# Patient Record
Sex: Male | Born: 1950
Health system: Southern US, Community
[De-identification: ages and names within clinical notes are randomized; demographics above are authoritative.]

## PROBLEM LIST (undated history)

## (undated) DIAGNOSIS — Z8719 Personal history of other diseases of the digestive system: Secondary | ICD-10-CM

## (undated) DIAGNOSIS — J302 Other seasonal allergic rhinitis: Secondary | ICD-10-CM

## (undated) DIAGNOSIS — G4700 Insomnia, unspecified: Secondary | ICD-10-CM

## (undated) DIAGNOSIS — S7290XA Unspecified fracture of unspecified femur, initial encounter for closed fracture: Secondary | ICD-10-CM

## (undated) DIAGNOSIS — J449 Chronic obstructive pulmonary disease, unspecified: Secondary | ICD-10-CM

## (undated) DIAGNOSIS — F1020 Alcohol dependence, uncomplicated: Secondary | ICD-10-CM

## (undated) DIAGNOSIS — F329 Major depressive disorder, single episode, unspecified: Secondary | ICD-10-CM

## (undated) DIAGNOSIS — J189 Pneumonia, unspecified organism: Secondary | ICD-10-CM

## (undated) DIAGNOSIS — I10 Essential (primary) hypertension: Secondary | ICD-10-CM

## (undated) DIAGNOSIS — M5090 Cervical disc disorder, unspecified, unspecified cervical region: Secondary | ICD-10-CM

## (undated) DIAGNOSIS — I639 Cerebral infarction, unspecified: Secondary | ICD-10-CM

## (undated) DIAGNOSIS — S2239XA Fracture of one rib, unspecified side, initial encounter for closed fracture: Secondary | ICD-10-CM

## (undated) DIAGNOSIS — D696 Thrombocytopenia, unspecified: Secondary | ICD-10-CM

## (undated) DIAGNOSIS — E785 Hyperlipidemia, unspecified: Secondary | ICD-10-CM

## (undated) DIAGNOSIS — IMO0001 Reserved for inherently not codable concepts without codable children: Secondary | ICD-10-CM

## (undated) DIAGNOSIS — I719 Aortic aneurysm of unspecified site, without rupture: Secondary | ICD-10-CM

## (undated) DIAGNOSIS — H9190 Unspecified hearing loss, unspecified ear: Secondary | ICD-10-CM

## (undated) DIAGNOSIS — S92355A Nondisplaced fracture of fifth metatarsal bone, left foot, initial encounter for closed fracture: Secondary | ICD-10-CM

## (undated) DIAGNOSIS — G259 Extrapyramidal and movement disorder, unspecified: Secondary | ICD-10-CM

## (undated) DIAGNOSIS — F1090 Alcohol use, unspecified, uncomplicated: Secondary | ICD-10-CM

## (undated) DIAGNOSIS — L602 Onychogryphosis: Secondary | ICD-10-CM

## (undated) DIAGNOSIS — I712 Thoracic aortic aneurysm, without rupture, unspecified: Secondary | ICD-10-CM

## (undated) DIAGNOSIS — T4145XA Adverse effect of unspecified anesthetic, initial encounter: Secondary | ICD-10-CM

## (undated) DIAGNOSIS — M1610 Unilateral primary osteoarthritis, unspecified hip: Secondary | ICD-10-CM

## (undated) DIAGNOSIS — E119 Type 2 diabetes mellitus without complications: Secondary | ICD-10-CM

## (undated) DIAGNOSIS — F1010 Alcohol abuse, uncomplicated: Secondary | ICD-10-CM

## (undated) DIAGNOSIS — K219 Gastro-esophageal reflux disease without esophagitis: Secondary | ICD-10-CM

## (undated) DIAGNOSIS — I7121 Aneurysm of the ascending aorta, without rupture: Secondary | ICD-10-CM

## (undated) DIAGNOSIS — I2510 Atherosclerotic heart disease of native coronary artery without angina pectoris: Secondary | ICD-10-CM

## (undated) DIAGNOSIS — K7460 Unspecified cirrhosis of liver: Secondary | ICD-10-CM

## (undated) DIAGNOSIS — Z7289 Other problems related to lifestyle: Secondary | ICD-10-CM

## (undated) DIAGNOSIS — I509 Heart failure, unspecified: Secondary | ICD-10-CM

## (undated) DIAGNOSIS — R0902 Hypoxemia: Secondary | ICD-10-CM

## (undated) DIAGNOSIS — G4730 Sleep apnea, unspecified: Secondary | ICD-10-CM

## (undated) DIAGNOSIS — J40 Bronchitis, not specified as acute or chronic: Secondary | ICD-10-CM

## (undated) DIAGNOSIS — B1920 Unspecified viral hepatitis C without hepatic coma: Secondary | ICD-10-CM

## (undated) DIAGNOSIS — I499 Cardiac arrhythmia, unspecified: Secondary | ICD-10-CM

## (undated) DIAGNOSIS — H269 Unspecified cataract: Secondary | ICD-10-CM

## (undated) DIAGNOSIS — I4891 Unspecified atrial fibrillation: Secondary | ICD-10-CM

## (undated) DIAGNOSIS — T8859XA Other complications of anesthesia, initial encounter: Secondary | ICD-10-CM

## (undated) DIAGNOSIS — K223 Perforation of esophagus: Secondary | ICD-10-CM

## (undated) DIAGNOSIS — Z87891 Personal history of nicotine dependence: Secondary | ICD-10-CM

## (undated) DIAGNOSIS — F32A Depression, unspecified: Secondary | ICD-10-CM

## (undated) DIAGNOSIS — J439 Emphysema, unspecified: Secondary | ICD-10-CM

## (undated) HISTORY — DX: Extrapyramidal and movement disorder, unspecified: G25.9

## (undated) HISTORY — DX: Unspecified cirrhosis of liver: K74.60

## (undated) HISTORY — PX: APPENDECTOMY: SHX54

## (undated) HISTORY — PX: PEG TUBE REMOVAL: SHX2187

## (undated) HISTORY — DX: Hypoxemia: R09.02

## (undated) HISTORY — PX: TRACHEOSTOMY: SUR1362

## (undated) HISTORY — DX: Thrombocytopenia, unspecified: D69.6

## (undated) HISTORY — DX: Unilateral primary osteoarthritis, unspecified hip: M16.10

## (undated) HISTORY — PX: COLON RESECTION: SHX5231

## (undated) HISTORY — PX: COLON SURGERY: SHX602

## (undated) HISTORY — DX: Unspecified atrial fibrillation: I48.91

## (undated) HISTORY — DX: Unspecified viral hepatitis C without hepatic coma: B19.20

## (undated) HISTORY — DX: Unspecified fracture of unspecified femur, initial encounter for closed fracture: S72.90XA

## (undated) HISTORY — DX: Nondisplaced fracture of fifth metatarsal bone, left foot, initial encounter for closed fracture: S92.355A

## (undated) HISTORY — DX: Aortic aneurysm of unspecified site, without rupture: I71.9

## (undated) HISTORY — DX: Fracture of one rib, unspecified side, initial encounter for closed fracture: S22.39XA

## (undated) HISTORY — PX: HERNIA REPAIR: SHX51

## (undated) HISTORY — DX: Hyperlipidemia, unspecified: E78.5

## (undated) HISTORY — DX: Onychogryphosis: L60.2

## (undated) HISTORY — DX: Alcohol use, unspecified, uncomplicated: F10.90

## (undated) HISTORY — DX: Other problems related to lifestyle: Z72.89

## (undated) HISTORY — DX: Insomnia, unspecified: G47.00

## (undated) HISTORY — DX: Alcohol abuse, uncomplicated: F10.10

## (undated) HISTORY — PX: PEG PLACEMENT: SHX5437

## (undated) HISTORY — DX: Atherosclerotic heart disease of native coronary artery without angina pectoris: I25.10

## (undated) HISTORY — DX: Emphysema, unspecified: J43.9

## (undated) HISTORY — DX: Unspecified cataract: H26.9

## (undated) HISTORY — DX: Personal history of other diseases of the digestive system: Z87.19

## (undated) HISTORY — PX: PILONIDAL CYST EXCISION: SHX744

## (undated) HISTORY — PX: FRACTURE SURGERY: SHX138

## (undated) HISTORY — DX: Personal history of nicotine dependence: Z87.891

## (undated) HISTORY — PX: CHOLECYSTECTOMY: SHX55

## (undated) HISTORY — DX: Alcohol dependence, uncomplicated: F10.20

---

## 2006-08-04 ENCOUNTER — Other Ambulatory Visit: Payer: Self-pay

## 2006-08-04 ENCOUNTER — Inpatient Hospital Stay: Payer: Self-pay | Admitting: Internal Medicine

## 2007-08-20 ENCOUNTER — Ambulatory Visit: Payer: Self-pay | Admitting: Gastroenterology

## 2008-09-10 ENCOUNTER — Inpatient Hospital Stay: Payer: Self-pay | Admitting: Internal Medicine

## 2008-09-10 ENCOUNTER — Other Ambulatory Visit: Payer: Self-pay

## 2008-09-13 ENCOUNTER — Other Ambulatory Visit: Payer: Self-pay

## 2008-10-11 ENCOUNTER — Ambulatory Visit: Payer: Self-pay | Admitting: Surgery

## 2008-10-12 ENCOUNTER — Ambulatory Visit: Payer: Self-pay | Admitting: Surgery

## 2009-03-18 ENCOUNTER — Inpatient Hospital Stay: Payer: Self-pay | Admitting: Internal Medicine

## 2009-05-02 ENCOUNTER — Ambulatory Visit: Payer: Self-pay | Admitting: Specialist

## 2009-10-17 ENCOUNTER — Ambulatory Visit: Payer: Self-pay | Admitting: Urology

## 2009-10-23 ENCOUNTER — Ambulatory Visit: Payer: Self-pay | Admitting: Surgery

## 2009-10-30 ENCOUNTER — Inpatient Hospital Stay: Payer: Self-pay | Admitting: Surgery

## 2010-01-04 ENCOUNTER — Ambulatory Visit: Payer: Self-pay | Admitting: Unknown Physician Specialty

## 2011-04-06 ENCOUNTER — Inpatient Hospital Stay: Payer: Self-pay | Admitting: Internal Medicine

## 2011-12-10 ENCOUNTER — Ambulatory Visit: Payer: Self-pay | Admitting: Internal Medicine

## 2012-01-03 ENCOUNTER — Inpatient Hospital Stay: Payer: Self-pay | Admitting: Internal Medicine

## 2012-01-03 LAB — COMPREHENSIVE METABOLIC PANEL
Albumin: 3.2 g/dL — ABNORMAL LOW (ref 3.4–5.0)
Alkaline Phosphatase: 74 U/L (ref 50–136)
Anion Gap: 14 (ref 7–16)
Bilirubin,Total: 0.5 mg/dL (ref 0.2–1.0)
Calcium, Total: 8.2 mg/dL — ABNORMAL LOW (ref 8.5–10.1)
Glucose: 248 mg/dL — ABNORMAL HIGH (ref 65–99)
Osmolality: 272 (ref 275–301)
Potassium: 3.2 mmol/L — ABNORMAL LOW (ref 3.5–5.1)
SGOT(AST): 65 U/L — ABNORMAL HIGH (ref 15–37)
Sodium: 130 mmol/L — ABNORMAL LOW (ref 136–145)
Total Protein: 7 g/dL (ref 6.4–8.2)

## 2012-01-03 LAB — CK TOTAL AND CKMB (NOT AT ARMC)
CK, Total: 378 U/L — ABNORMAL HIGH (ref 35–232)
CK-MB: 9.9 ng/mL — ABNORMAL HIGH (ref 0.5–3.6)

## 2012-01-03 LAB — PRO B NATRIURETIC PEPTIDE: B-Type Natriuretic Peptide: 871 pg/mL — ABNORMAL HIGH (ref 0–125)

## 2012-01-03 LAB — CBC
HCT: 30.3 % — ABNORMAL LOW (ref 40.0–52.0)
HGB: 10.3 g/dL — ABNORMAL LOW (ref 13.0–18.0)
MCV: 81 fL (ref 80–100)
Platelet: 270 10*3/uL (ref 150–440)
RDW: 16.2 % — ABNORMAL HIGH (ref 11.5–14.5)

## 2012-01-03 LAB — POTASSIUM: Potassium: 3.9 mmol/L (ref 3.5–5.1)

## 2012-01-03 LAB — SODIUM: Sodium: 130 mmol/L — ABNORMAL LOW (ref 136–145)

## 2012-01-03 LAB — TROPONIN I: Troponin-I: 0.02 ng/mL

## 2012-01-04 LAB — IRON AND TIBC
Iron Bind.Cap.(Total): 402 ug/dL (ref 250–450)
Iron Saturation: 5 %
Iron: 19 ug/dL — ABNORMAL LOW (ref 65–175)
Unbound Iron-Bind.Cap.: 383 ug/dL

## 2012-01-04 LAB — BASIC METABOLIC PANEL
Anion Gap: 11 (ref 7–16)
BUN: 31 mg/dL — ABNORMAL HIGH (ref 7–18)
BUN: 37 mg/dL — ABNORMAL HIGH (ref 7–18)
Calcium, Total: 7.1 mg/dL — ABNORMAL LOW (ref 8.5–10.1)
Chloride: 92 mmol/L — ABNORMAL LOW (ref 98–107)
Co2: 27 mmol/L (ref 21–32)
Co2: 25 mmol/L (ref 21–32)
Creatinine: 1.91 mg/dL — ABNORMAL HIGH (ref 0.60–1.30)
Creatinine: 2.06 mg/dL — ABNORMAL HIGH (ref 0.60–1.30)
EGFR (African American): 47 — ABNORMAL LOW
EGFR (Non-African Amer.): 35 — ABNORMAL LOW
Glucose: 214 mg/dL — ABNORMAL HIGH (ref 65–99)
Osmolality: 285 (ref 275–301)
Osmolality: 283 (ref 275–301)
Potassium: 4.6 mmol/L (ref 3.5–5.1)

## 2012-01-04 LAB — CBC WITH DIFFERENTIAL/PLATELET
Basophil #: 0 10*3/uL (ref 0.0–0.1)
Eosinophil %: 0 %
HCT: 28.1 % — ABNORMAL LOW (ref 40.0–52.0)
Lymphocyte %: 2.5 %
Monocyte %: 1.2 %
Platelet: 271 10*3/uL (ref 150–440)
RBC: 3.42 10*6/uL — ABNORMAL LOW (ref 4.40–5.90)
RDW: 16.9 % — ABNORMAL HIGH (ref 11.5–14.5)
WBC: 8.4 10*3/uL (ref 3.8–10.6)

## 2012-01-04 LAB — HEMOGLOBIN: HGB: 9 g/dL — ABNORMAL LOW (ref 13.0–18.0)

## 2012-01-04 LAB — HEMOGLOBIN A1C: Hemoglobin A1C: 9.1 % — ABNORMAL HIGH (ref 4.2–6.3)

## 2012-01-04 LAB — FERRITIN: Ferritin (ARMC): 26 ng/mL (ref 8–388)

## 2012-01-04 LAB — APTT: Activated PTT: 27.4 secs (ref 23.6–35.9)

## 2012-01-05 LAB — BASIC METABOLIC PANEL
Chloride: 106 mmol/L (ref 98–107)
Co2: 26 mmol/L (ref 21–32)
Creatinine: 1.36 mg/dL — ABNORMAL HIGH (ref 0.60–1.30)
EGFR (African American): >60
EGFR (Non-African Amer.): 57 — ABNORMAL LOW
Osmolality: 293 (ref 275–301)
Potassium: 4.6 mmol/L (ref 3.5–5.1)
Sodium: 140 mmol/L (ref 136–145)

## 2012-01-05 LAB — HEPATIC FUNCTION PANEL A (ARMC)
Alkaline Phosphatase: 48 U/L — ABNORMAL LOW (ref 50–136)
Bilirubin, Direct: <0.1 mg/dL (ref 0.00–0.20)
SGOT(AST): 44 U/L — ABNORMAL HIGH (ref 15–37)
SGPT (ALT): 38 U/L

## 2012-01-05 LAB — CBC WITH DIFFERENTIAL/PLATELET
Basophil %: 1.7 %
Eosinophil %: 0 %
HCT: 28.3 % — ABNORMAL LOW (ref 40.0–52.0)
HGB: 8.9 g/dL — ABNORMAL LOW (ref 13.0–18.0)
Lymphocyte #: 0.3 10*3/uL — ABNORMAL LOW (ref 1.0–3.6)
Lymphocyte %: 2.2 %
MCH: 26.2 pg (ref 26.0–34.0)
MCV: 83 fL (ref 80–100)
Monocyte #: 0.1 10*3/uL (ref 0.0–0.7)
RBC: 3.41 10*6/uL — ABNORMAL LOW (ref 4.40–5.90)
WBC: 11.6 10*3/uL — ABNORMAL HIGH (ref 3.8–10.6)

## 2012-01-05 LAB — APTT
Activated PTT: 120 secs — ABNORMAL HIGH (ref 23.6–35.9)
Activated PTT: >160 secs (ref 23.6–35.9)

## 2012-01-05 LAB — OSMOLALITY, URINE: Osmolality: 456 mOsm/kg

## 2012-01-05 LAB — URIC ACID: Uric Acid: 4.8 mg/dL (ref 3.5–7.2)

## 2012-01-06 LAB — CBC WITH DIFFERENTIAL/PLATELET
Basophil %: 0 %
Eosinophil #: 0 10*3/uL (ref 0.0–0.7)
Eosinophil %: 0 %
HCT: 28.7 % — ABNORMAL LOW (ref 40.0–52.0)
HGB: 8.9 g/dL — ABNORMAL LOW (ref 13.0–18.0)
Lymphocyte %: 1.5 %
MCH: 25.8 pg — ABNORMAL LOW (ref 26.0–34.0)
MCHC: 31.1 g/dL — ABNORMAL LOW (ref 32.0–36.0)
MCV: 83 fL (ref 80–100)
Monocyte #: 0.3 10*3/uL (ref 0.0–0.7)
Neutrophil #: 12 10*3/uL — ABNORMAL HIGH (ref 1.4–6.5)
Platelet: 289 10*3/uL (ref 150–440)
RBC: 3.47 10*6/uL — ABNORMAL LOW (ref 4.40–5.90)

## 2012-01-06 LAB — BASIC METABOLIC PANEL
Anion Gap: 11 (ref 7–16)
BUN: 48 mg/dL — ABNORMAL HIGH (ref 7–18)
Calcium, Total: 6.9 mg/dL — CL (ref 8.5–10.1)
Chloride: 109 mmol/L — ABNORMAL HIGH (ref 98–107)
EGFR (Non-African Amer.): 51 — ABNORMAL LOW
Osmolality: 304 (ref 275–301)
Potassium: 4.6 mmol/L (ref 3.5–5.1)
Sodium: 143 mmol/L (ref 136–145)

## 2012-01-07 LAB — CBC WITH DIFFERENTIAL/PLATELET
Basophil %: 0 %
Eosinophil #: 0 10*3/uL (ref 0.0–0.7)
Eosinophil %: 0 %
HCT: 27.6 % — ABNORMAL LOW (ref 40.0–52.0)
HGB: 8.7 g/dL — ABNORMAL LOW (ref 13.0–18.0)
Lymphocyte #: 0.2 10*3/uL — ABNORMAL LOW (ref 1.0–3.6)
MCH: 26 pg (ref 26.0–34.0)
MCHC: 31.5 g/dL — ABNORMAL LOW (ref 32.0–36.0)
MCV: 82 fL (ref 80–100)
Monocyte #: 0.4 10*3/uL (ref 0.0–0.7)
Neutrophil #: 7.1 10*3/uL — ABNORMAL HIGH (ref 1.4–6.5)
RBC: 3.36 10*6/uL — ABNORMAL LOW (ref 4.40–5.90)
RDW: 17 % — ABNORMAL HIGH (ref 11.5–14.5)

## 2012-01-07 LAB — BASIC METABOLIC PANEL
Anion Gap: 10 (ref 7–16)
Anion Gap: 12 (ref 7–16)
BUN: 51 mg/dL — ABNORMAL HIGH (ref 7–18)
Calcium, Total: 7.6 mg/dL — ABNORMAL LOW (ref 8.5–10.1)
Calcium, Total: 8 mg/dL — ABNORMAL LOW (ref 8.5–10.1)
Chloride: 110 mmol/L — ABNORMAL HIGH (ref 98–107)
Creatinine: 1.14 mg/dL (ref 0.60–1.30)
EGFR (African American): >60
EGFR (Non-African Amer.): 60 — ABNORMAL LOW
EGFR (Non-African Amer.): >60
Glucose: 110 mg/dL — ABNORMAL HIGH (ref 65–99)
Glucose: 251 mg/dL — ABNORMAL HIGH (ref 65–99)
Osmolality: 303 (ref 275–301)
Osmolality: 305 (ref 275–301)
Potassium: 3.5 mmol/L (ref 3.5–5.1)

## 2012-01-07 LAB — PROTEIN ELECTROPHORESIS(ARMC)

## 2012-01-07 LAB — PHOSPHORUS: Phosphorus: 2.8 mg/dL (ref 2.5–4.9)

## 2012-01-07 LAB — MAGNESIUM: Magnesium: 2 mg/dL

## 2012-01-08 LAB — PATHOLOGY REPORT

## 2012-01-09 LAB — CBC WITH DIFFERENTIAL/PLATELET
Basophil #: 0 10*3/uL (ref 0.0–0.1)
HCT: 33 % — ABNORMAL LOW (ref 40.0–52.0)
Lymphocyte %: 3.6 %
MCV: 82 fL (ref 80–100)
Monocyte %: 3.4 %
Neutrophil #: 7.4 10*3/uL — ABNORMAL HIGH (ref 1.4–6.5)
Neutrophil %: 92.9 %
RDW: 17 % — ABNORMAL HIGH (ref 11.5–14.5)
WBC: 8 10*3/uL (ref 3.8–10.6)

## 2012-01-09 LAB — BASIC METABOLIC PANEL
Anion Gap: 9 (ref 7–16)
BUN: 35 mg/dL — ABNORMAL HIGH (ref 7–18)
Calcium, Total: 8 mg/dL — ABNORMAL LOW (ref 8.5–10.1)
EGFR (African American): >60
EGFR (Non-African Amer.): >60
Glucose: 135 mg/dL — ABNORMAL HIGH (ref 65–99)
Potassium: 3.4 mmol/L — ABNORMAL LOW (ref 3.5–5.1)
Sodium: 143 mmol/L (ref 136–145)

## 2012-01-09 LAB — MAGNESIUM: Magnesium: 1.5 mg/dL — ABNORMAL LOW

## 2012-01-09 LAB — POTASSIUM: Potassium: 4.5 mmol/L (ref 3.5–5.1)

## 2012-01-10 ENCOUNTER — Ambulatory Visit: Payer: Self-pay | Admitting: Internal Medicine

## 2012-01-10 LAB — MAGNESIUM: Magnesium: 1.4 mg/dL — ABNORMAL LOW

## 2012-01-11 LAB — CBC WITH DIFFERENTIAL/PLATELET
Basophil #: 0 10*3/uL (ref 0.0–0.1)
Basophil %: 0 %
Eosinophil #: 0 10*3/uL (ref 0.0–0.7)
HCT: 33.2 % — ABNORMAL LOW (ref 40.0–52.0)
HGB: 10.4 g/dL — ABNORMAL LOW (ref 13.0–18.0)
Lymphocyte #: 0.4 10*3/uL — ABNORMAL LOW (ref 1.0–3.6)
Lymphocyte %: 3.4 %
MCH: 25.6 pg — ABNORMAL LOW (ref 26.0–34.0)
MCHC: 31.5 g/dL — ABNORMAL LOW (ref 32.0–36.0)
MCV: 81 fL (ref 80–100)
Monocyte #: 0.2 10*3/uL (ref 0.0–0.7)
Neutrophil #: 10 10*3/uL — ABNORMAL HIGH (ref 1.4–6.5)
Neutrophil %: 95 %
Platelet: 211 10*3/uL (ref 150–440)
RDW: 17.4 % — ABNORMAL HIGH (ref 11.5–14.5)
WBC: 10.5 10*3/uL (ref 3.8–10.6)

## 2012-01-11 LAB — BASIC METABOLIC PANEL
BUN: 45 mg/dL — ABNORMAL HIGH (ref 7–18)
Calcium, Total: 7.8 mg/dL — ABNORMAL LOW (ref 8.5–10.1)
Chloride: 96 mmol/L — ABNORMAL LOW (ref 98–107)
Co2: 37 mmol/L — ABNORMAL HIGH (ref 21–32)
EGFR (Non-African Amer.): >60
Osmolality: 302 (ref 275–301)
Potassium: 3.7 mmol/L (ref 3.5–5.1)
Sodium: 143 mmol/L (ref 136–145)

## 2012-01-11 LAB — PHOSPHORUS: Phosphorus: 4.5 mg/dL (ref 2.5–4.9)

## 2012-01-12 LAB — BASIC METABOLIC PANEL
BUN: 41 mg/dL — ABNORMAL HIGH (ref 7–18)
Calcium, Total: 8.2 mg/dL — ABNORMAL LOW (ref 8.5–10.1)
Co2: 35 mmol/L — ABNORMAL HIGH (ref 21–32)
Creatinine: 0.94 mg/dL (ref 0.60–1.30)
EGFR (Non-African Amer.): >60
Glucose: 228 mg/dL — ABNORMAL HIGH (ref 65–99)
Potassium: 3.4 mmol/L — ABNORMAL LOW (ref 3.5–5.1)
Sodium: 145 mmol/L (ref 136–145)

## 2012-01-12 LAB — CBC WITH DIFFERENTIAL/PLATELET
Basophil #: 0.3 10*3/uL — ABNORMAL HIGH (ref 0.0–0.1)
Basophil %: 2 %
Eosinophil #: 0 10*3/uL (ref 0.0–0.7)
Eosinophil %: 0 %
Lymphocyte #: 0.2 10*3/uL — ABNORMAL LOW (ref 1.0–3.6)
Lymphocyte %: 1.4 %
MCH: 25.8 pg — ABNORMAL LOW (ref 26.0–34.0)
MCHC: 31.6 g/dL — ABNORMAL LOW (ref 32.0–36.0)
Neutrophil #: 12 10*3/uL — ABNORMAL HIGH (ref 1.4–6.5)
RBC: 4.03 10*6/uL — ABNORMAL LOW (ref 4.40–5.90)
RDW: 17.7 % — ABNORMAL HIGH (ref 11.5–14.5)
WBC: 12.8 10*3/uL — ABNORMAL HIGH (ref 3.8–10.6)

## 2012-01-13 LAB — CBC WITH DIFFERENTIAL/PLATELET
Basophil #: 0 10*3/uL (ref 0.0–0.1)
Basophil %: 0 %
Eosinophil #: 0 10*3/uL (ref 0.0–0.7)
Eosinophil %: 0.1 %
Lymphocyte #: 0.7 10*3/uL — ABNORMAL LOW (ref 1.0–3.6)
Lymphocyte %: 4.8 %
MCH: 25.7 pg — ABNORMAL LOW (ref 26.0–34.0)
MCHC: 31.5 g/dL — ABNORMAL LOW (ref 32.0–36.0)
Monocyte #: 0.3 10*3/uL (ref 0.0–0.7)
Neutrophil %: 93 %
RBC: 4.18 10*6/uL — ABNORMAL LOW (ref 4.40–5.90)
RDW: 17.5 % — ABNORMAL HIGH (ref 11.5–14.5)
WBC: 14.9 10*3/uL — ABNORMAL HIGH (ref 3.8–10.6)

## 2012-01-13 LAB — BASIC METABOLIC PANEL
Anion Gap: 10 (ref 7–16)
Calcium, Total: 8.4 mg/dL — ABNORMAL LOW (ref 8.5–10.1)
Co2: 36 mmol/L — ABNORMAL HIGH (ref 21–32)
EGFR (African American): >60
EGFR (Non-African Amer.): >60
Osmolality: 302 (ref 275–301)
Potassium: 3.7 mmol/L (ref 3.5–5.1)
Sodium: 144 mmol/L (ref 136–145)

## 2012-01-13 LAB — PHOSPHORUS: Phosphorus: 2.8 mg/dL (ref 2.5–4.9)

## 2012-01-13 LAB — PROT IMMUNOELECTROPHORES(ARMC)

## 2012-01-14 LAB — BASIC METABOLIC PANEL
Anion Gap: 11 (ref 7–16)
Chloride: 98 mmol/L (ref 98–107)
Co2: 33 mmol/L — ABNORMAL HIGH (ref 21–32)
EGFR (Non-African Amer.): >60
Osmolality: 305 (ref 275–301)
Potassium: 4.2 mmol/L (ref 3.5–5.1)

## 2012-01-14 LAB — WBC: WBC: 14.9 10*3/uL — ABNORMAL HIGH (ref 3.8–10.6)

## 2012-01-14 LAB — PHOSPHORUS: Phosphorus: 2.7 mg/dL (ref 2.5–4.9)

## 2012-01-15 LAB — CBC WITH DIFFERENTIAL/PLATELET
Basophil #: 0 10*3/uL (ref 0.0–0.1)
Eosinophil #: 0 10*3/uL (ref 0.0–0.7)
Eosinophil %: 0 %
HGB: 10.7 g/dL — ABNORMAL LOW (ref 13.0–18.0)
Lymphocyte #: 0.5 10*3/uL — ABNORMAL LOW (ref 1.0–3.6)
Lymphocyte %: 3.8 %
MCH: 25.6 pg — ABNORMAL LOW (ref 26.0–34.0)
MCV: 82 fL (ref 80–100)
Monocyte #: 0.4 10*3/uL (ref 0.0–0.7)
Neutrophil %: 92.7 %
Platelet: 115 10*3/uL — ABNORMAL LOW (ref 150–440)
RBC: 4.17 10*6/uL — ABNORMAL LOW (ref 4.40–5.90)
WBC: 12.7 10*3/uL — ABNORMAL HIGH (ref 3.8–10.6)

## 2012-01-15 LAB — BASIC METABOLIC PANEL
Calcium, Total: 8.7 mg/dL (ref 8.5–10.1)
Chloride: 99 mmol/L (ref 98–107)
Co2: 30 mmol/L (ref 21–32)
EGFR (African American): >60
EGFR (Non-African Amer.): >60
Potassium: 4.3 mmol/L (ref 3.5–5.1)
Sodium: 139 mmol/L (ref 136–145)

## 2012-01-15 LAB — PHOSPHORUS: Phosphorus: 3.7 mg/dL (ref 2.5–4.9)

## 2012-01-16 LAB — CALCIUM: Calcium, Total: 8.4 mg/dL — ABNORMAL LOW (ref 8.5–10.1)

## 2012-01-16 LAB — CBC WITH DIFFERENTIAL/PLATELET
Basophil #: 0.1 10*3/uL (ref 0.0–0.1)
Basophil %: 0.6 %
Eosinophil #: 0 10*3/uL (ref 0.0–0.7)
HCT: 34.5 % — ABNORMAL LOW (ref 40.0–52.0)
HGB: 10.8 g/dL — ABNORMAL LOW (ref 13.0–18.0)
Lymphocyte %: 2.4 %
MCHC: 31.3 g/dL — ABNORMAL LOW (ref 32.0–36.0)
MCV: 82 fL (ref 80–100)
Monocyte %: 4.3 %
Neutrophil #: 15.1 10*3/uL — ABNORMAL HIGH (ref 1.4–6.5)
WBC: 16.3 10*3/uL — ABNORMAL HIGH (ref 3.8–10.6)

## 2012-01-16 LAB — URINALYSIS, COMPLETE
Ketone: NEGATIVE
Nitrite: NEGATIVE
Ph: 7 (ref 4.5–8.0)
Protein: NEGATIVE
Specific Gravity: 1.016 (ref 1.003–1.030)
WBC UR: 1 /HPF (ref 0–5)

## 2012-01-16 LAB — BASIC METABOLIC PANEL
Anion Gap: 11 (ref 7–16)
Chloride: 100 mmol/L (ref 98–107)
Creatinine: 0.98 mg/dL (ref 0.60–1.30)
EGFR (African American): >60
EGFR (Non-African Amer.): >60
Osmolality: 283 (ref 275–301)
Sodium: 139 mmol/L (ref 136–145)

## 2012-01-16 LAB — MAGNESIUM: Magnesium: 1.8 mg/dL

## 2012-01-17 LAB — CBC WITH DIFFERENTIAL/PLATELET
Basophil #: 0 10*3/uL (ref 0.0–0.1)
Basophil %: 0.3 %
Eosinophil #: 0 10*3/uL (ref 0.0–0.7)
Eosinophil %: 0.1 %
Eosinophil %: 0.1 %
HCT: 32.6 % — ABNORMAL LOW (ref 40.0–52.0)
HGB: 9.7 g/dL — ABNORMAL LOW (ref 13.0–18.0)
Lymphocyte %: 13 %
Lymphocyte %: 10.5 %
MCHC: 31.7 g/dL — ABNORMAL LOW (ref 32.0–36.0)
MCV: 81 fL (ref 80–100)
Monocyte #: 0.6 10*3/uL (ref 0.0–0.7)
Monocyte %: 6.6 %
Neutrophil #: 8.1 10*3/uL — ABNORMAL HIGH (ref 1.4–6.5)
Neutrophil #: 11.7 10*3/uL — ABNORMAL HIGH (ref 1.4–6.5)
Neutrophil %: 80.3 %
Neutrophil %: 84.7 %
Platelet: 65 10*3/uL — ABNORMAL LOW (ref 150–440)
RBC: 4.02 10*6/uL — ABNORMAL LOW (ref 4.40–5.90)
RDW: 17.9 % — ABNORMAL HIGH (ref 11.5–14.5)
WBC: 13.7 10*3/uL — ABNORMAL HIGH (ref 3.8–10.6)

## 2012-01-17 LAB — BASIC METABOLIC PANEL
Chloride: 98 mmol/L (ref 98–107)
Co2: 26 mmol/L (ref 21–32)
Creatinine: 1.08 mg/dL (ref 0.60–1.30)
EGFR (African American): >60
EGFR (Non-African Amer.): >60
Glucose: 119 mg/dL — ABNORMAL HIGH (ref 65–99)
Osmolality: 280 (ref 275–301)
Sodium: 136 mmol/L (ref 136–145)

## 2012-01-17 LAB — CALCIUM: Calcium, Total: 7.8 mg/dL — ABNORMAL LOW (ref 8.5–10.1)

## 2012-01-17 LAB — PHOSPHORUS: Phosphorus: 3 mg/dL (ref 2.5–4.9)

## 2012-01-17 LAB — MAGNESIUM: Magnesium: 1.5 mg/dL — ABNORMAL LOW

## 2012-01-18 LAB — CBC WITH DIFFERENTIAL/PLATELET
Basophil #: 0 10*3/uL (ref 0.0–0.1)
Eosinophil #: 0 10*3/uL (ref 0.0–0.7)
HCT: 28.7 % — ABNORMAL LOW (ref 40.0–52.0)
Lymphocyte #: 0.7 10*3/uL — ABNORMAL LOW (ref 1.0–3.6)
Lymphocyte %: 11.9 %
MCH: 26 pg (ref 26.0–34.0)
MCHC: 31.6 g/dL — ABNORMAL LOW (ref 32.0–36.0)
Monocyte %: 8.7 %
Neutrophil #: 4.8 10*3/uL (ref 1.4–6.5)
Platelet: 62 10*3/uL — ABNORMAL LOW (ref 150–440)
RDW: 17.9 % — ABNORMAL HIGH (ref 11.5–14.5)
WBC: 6 10*3/uL (ref 3.8–10.6)

## 2012-01-18 LAB — BASIC METABOLIC PANEL
Anion Gap: 9 (ref 7–16)
Calcium, Total: 7.8 mg/dL — ABNORMAL LOW (ref 8.5–10.1)
Chloride: 98 mmol/L (ref 98–107)
Co2: 26 mmol/L (ref 21–32)
Creatinine: 1.11 mg/dL (ref 0.60–1.30)
EGFR (African American): >60
Glucose: 302 mg/dL — ABNORMAL HIGH (ref 65–99)
Osmolality: 283 (ref 275–301)
Potassium: 4.4 mmol/L (ref 3.5–5.1)

## 2012-01-19 LAB — BASIC METABOLIC PANEL
Anion Gap: 8 (ref 7–16)
Chloride: 97 mmol/L — ABNORMAL LOW (ref 98–107)
Co2: 29 mmol/L (ref 21–32)
EGFR (Non-African Amer.): 52 — ABNORMAL LOW
Osmolality: 279 (ref 275–301)

## 2012-01-19 LAB — HEMOGLOBIN A1C: Hemoglobin A1C: 9.3 % — ABNORMAL HIGH (ref 4.2–6.3)

## 2012-01-19 LAB — CBC WITH DIFFERENTIAL/PLATELET
Basophil %: 3.1 %
Eosinophil %: 0.3 %
HCT: 30.3 % — ABNORMAL LOW (ref 40.0–52.0)
HGB: 9.6 g/dL — ABNORMAL LOW (ref 13.0–18.0)
Lymphocyte #: 2.1 10*3/uL (ref 1.0–3.6)
MCH: 25.7 pg — ABNORMAL LOW (ref 26.0–34.0)
MCV: 82 fL (ref 80–100)
Monocyte #: 0.4 10*3/uL (ref 0.0–0.7)
Monocyte %: 4 %
Neutrophil #: 7.1 10*3/uL — ABNORMAL HIGH (ref 1.4–6.5)
Platelet: 58 10*3/uL — ABNORMAL LOW (ref 150–440)

## 2012-01-20 LAB — CBC WITH DIFFERENTIAL/PLATELET
Basophil #: 0 10*3/uL (ref 0.0–0.1)
Basophil %: 0 %
Eosinophil #: 0 10*3/uL (ref 0.0–0.7)
Eosinophil %: 0 %
HCT: 28.2 % — ABNORMAL LOW (ref 40.0–52.0)
HGB: 9.1 g/dL — ABNORMAL LOW (ref 13.0–18.0)
Lymphocyte #: 0.9 10*3/uL — ABNORMAL LOW (ref 1.0–3.6)
Lymphocyte %: 2.2 %
MCH: 26.5 pg (ref 26.0–34.0)
MCHC: 32.5 g/dL (ref 32.0–36.0)
MCV: 82 fL (ref 80–100)
Monocyte #: 0.4 10*3/uL (ref 0.0–0.7)
Monocyte %: 2.9 %
Neutrophil #: 9 10*3/uL — ABNORMAL HIGH (ref 1.4–6.5)
Neutrophil #: 8.4 10*3/uL — ABNORMAL HIGH (ref 1.4–6.5)
Neutrophil %: 94.8 %
Platelet: 63 10*3/uL — ABNORMAL LOW (ref 150–440)
RBC: 3.55 10*6/uL — ABNORMAL LOW (ref 4.40–5.90)
WBC: 9.5 10*3/uL (ref 3.8–10.6)

## 2012-01-20 LAB — BASIC METABOLIC PANEL
Anion Gap: 9 (ref 7–16)
BUN: 37 mg/dL — ABNORMAL HIGH (ref 7–18)
BUN: 40 mg/dL — ABNORMAL HIGH (ref 7–18)
Calcium, Total: 8.4 mg/dL — ABNORMAL LOW (ref 8.5–10.1)
Chloride: 96 mmol/L — ABNORMAL LOW (ref 98–107)
Co2: 24 mmol/L (ref 21–32)
EGFR (African American): >60
EGFR (African American): >60
EGFR (Non-African Amer.): 54 — ABNORMAL LOW
EGFR (Non-African Amer.): 52 — ABNORMAL LOW
Glucose: 282 mg/dL — ABNORMAL HIGH (ref 65–99)
Glucose: 262 mg/dL — ABNORMAL HIGH (ref 65–99)
Osmolality: 278 (ref 275–301)
Potassium: 4.3 mmol/L (ref 3.5–5.1)
Potassium: 4.4 mmol/L (ref 3.5–5.1)
Sodium: 133 mmol/L — ABNORMAL LOW (ref 136–145)
Sodium: 129 mmol/L — ABNORMAL LOW (ref 136–145)

## 2012-01-27 ENCOUNTER — Ambulatory Visit: Payer: Self-pay | Admitting: Internal Medicine

## 2012-01-27 LAB — CBC CANCER CENTER
Basophil #: 0 "x10 3/mm "
Basophil %: 0.1 %
Eosinophil #: 0.2 "x10 3/mm "
Eosinophil %: 2.9 %
HCT: 28.8 % — ABNORMAL LOW
HGB: 9.1 g/dL — ABNORMAL LOW
Lymphocyte %: 15.3 %
Lymphs Abs: 1.2 "x10 3/mm "
MCH: 26.3 pg
MCHC: 31.6 g/dL — ABNORMAL LOW
MCV: 83 fL
Monocyte #: 0.4 "x10 3/mm "
Monocyte %: 5 %
Neutrophil #: 6.1 "x10 3/mm "
Neutrophil %: 76.7 %
Platelet: 91 "x10 3/mm " — ABNORMAL LOW
RBC: 3.45 "x10 6/mm " — ABNORMAL LOW
RDW: 21 % — ABNORMAL HIGH
WBC: 8 "x10 3/mm "

## 2012-01-27 LAB — CREATININE, SERUM
Creatinine: 1.12 mg/dL
EGFR (African American): >60
EGFR (Non-African Amer.): >60

## 2012-02-01 ENCOUNTER — Emergency Department: Payer: Self-pay | Admitting: Internal Medicine

## 2012-02-01 LAB — URINALYSIS, COMPLETE
Bacteria: NONE SEEN
Bilirubin,UR: NEGATIVE
Blood: NEGATIVE
Glucose,UR: NEGATIVE mg/dL (ref 0–75)
Hyaline Cast: 1
Ketone: NEGATIVE
Ph: 7 (ref 4.5–8.0)
Specific Gravity: 1.009 (ref 1.003–1.030)
Squamous Epithelial: NONE SEEN

## 2012-02-01 LAB — COMPREHENSIVE METABOLIC PANEL
Anion Gap: 9 (ref 7–16)
BUN: 14 mg/dL (ref 7–18)
Bilirubin,Total: 0.4 mg/dL (ref 0.2–1.0)
Chloride: 97 mmol/L — ABNORMAL LOW (ref 98–107)
Co2: 29 mmol/L (ref 21–32)
Creatinine: 1.07 mg/dL (ref 0.60–1.30)
EGFR (African American): >60
EGFR (Non-African Amer.): >60
Glucose: 81 mg/dL (ref 65–99)
Osmolality: 270 (ref 275–301)
Potassium: 3.6 mmol/L (ref 3.5–5.1)
SGPT (ALT): 69 U/L
Sodium: 135 mmol/L — ABNORMAL LOW (ref 136–145)

## 2012-02-01 LAB — CBC
HCT: 29 % — ABNORMAL LOW (ref 40.0–52.0)
MCHC: 32.2 g/dL (ref 32.0–36.0)
MCV: 85 fL (ref 80–100)
Platelet: 171 10*3/uL (ref 150–440)
RBC: 3.4 10*6/uL — ABNORMAL LOW (ref 4.40–5.90)
RDW: 24 % — ABNORMAL HIGH (ref 11.5–14.5)
WBC: 3.7 10*3/uL — ABNORMAL LOW (ref 3.8–10.6)

## 2012-02-01 LAB — LIPASE, BLOOD: Lipase: 166 U/L (ref 73–393)

## 2012-02-03 LAB — CBC CANCER CENTER
Eosinophil: 4 %
HCT: 31.3 % — ABNORMAL LOW (ref 40.0–52.0)
Lymphocytes: 36 %
MCHC: 32.1 g/dL (ref 32.0–36.0)
MCV: 84 fL (ref 80–100)
Monocytes: 22 %
Platelet: 242 x10 3/mm (ref 150–440)
RDW: 24.3 % — ABNORMAL HIGH (ref 11.5–14.5)
Variant Lymphocyte: 5 %
WBC: 3.3 x10 3/mm — ABNORMAL LOW (ref 3.8–10.6)

## 2012-02-04 LAB — CBC CANCER CENTER
Bands: 2 %
Basophil #: 0 x10 3/mm (ref 0.0–0.1)
Eosinophil #: 0 x10 3/mm (ref 0.0–0.7)
Eosinophil %: 0.4 %
Lymphocyte %: 24.4 %
MCH: 27.1 pg (ref 26.0–34.0)
MCHC: 32.1 g/dL (ref 32.0–36.0)
MCV: 84 fL (ref 80–100)
Metamyelocyte: 2 %
Monocyte #: 0.7 x10 3/mm (ref 0.0–0.7)
Neutrophil %: 52.7 %
Platelet: 261 x10 3/mm (ref 150–440)
RBC: 3.64 10*6/uL — ABNORMAL LOW (ref 4.40–5.90)
RDW: 24.8 % — ABNORMAL HIGH (ref 11.5–14.5)
Segmented Neutrophils: 42 %

## 2012-02-05 ENCOUNTER — Ambulatory Visit: Payer: Self-pay | Admitting: Internal Medicine

## 2012-02-07 ENCOUNTER — Ambulatory Visit: Payer: Self-pay | Admitting: Internal Medicine

## 2012-02-07 LAB — CULTURE, FUNGUS WITHOUT SMEAR

## 2012-02-11 ENCOUNTER — Emergency Department: Payer: Self-pay | Admitting: Emergency Medicine

## 2012-02-17 LAB — CBC CANCER CENTER
Basophil #: 0.1 x10 3/mm (ref 0.0–0.1)
Eosinophil %: 0.5 %
HGB: 11.8 g/dL — ABNORMAL LOW (ref 13.0–18.0)
Lymphocyte %: 13.2 %
MCH: 27.9 pg (ref 26.0–34.0)
MCHC: 33 g/dL (ref 32.0–36.0)
MCV: 84 fL (ref 80–100)
Monocyte %: 7.2 %
Neutrophil #: 8.7 x10 3/mm — ABNORMAL HIGH (ref 1.4–6.5)
Platelet: 223 x10 3/mm (ref 150–440)
RDW: 23.5 % — ABNORMAL HIGH (ref 11.5–14.5)

## 2012-03-09 ENCOUNTER — Ambulatory Visit: Payer: Self-pay | Admitting: Internal Medicine

## 2012-07-14 LAB — CBC
HCT: 40.9 % (ref 40.0–52.0)
HGB: 13.4 g/dL (ref 13.0–18.0)
MCH: 28.9 pg (ref 26.0–34.0)
MCHC: 32.7 g/dL (ref 32.0–36.0)
MCV: 88 fL (ref 80–100)
RBC: 4.62 10*6/uL (ref 4.40–5.90)
RDW: 17.7 % — ABNORMAL HIGH (ref 11.5–14.5)

## 2012-07-15 ENCOUNTER — Inpatient Hospital Stay: Payer: Self-pay | Admitting: Psychiatry

## 2012-07-15 LAB — COMPREHENSIVE METABOLIC PANEL
Albumin: 3.9 g/dL (ref 3.4–5.0)
Alkaline Phosphatase: 104 U/L (ref 50–136)
Calcium, Total: 8.4 mg/dL — ABNORMAL LOW (ref 8.5–10.1)
Chloride: 110 mmol/L — ABNORMAL HIGH (ref 98–107)
Glucose: 140 mg/dL — ABNORMAL HIGH (ref 65–99)
SGOT(AST): 84 U/L — ABNORMAL HIGH (ref 15–37)
SGPT (ALT): 68 U/L (ref 12–78)

## 2012-07-15 LAB — URINALYSIS, COMPLETE
Bacteria: NONE SEEN
Bilirubin,UR: NEGATIVE
Blood: NEGATIVE
Glucose,UR: NEGATIVE mg/dL (ref 0–75)
Hyaline Cast: 1
Ph: 5 (ref 4.5–8.0)
Protein: 30
Specific Gravity: 1.013 (ref 1.003–1.030)
Squamous Epithelial: <1

## 2012-07-15 LAB — DRUG SCREEN, URINE
Barbiturates, Ur Screen: NEGATIVE (ref ?–200)
Benzodiazepine, Ur Scrn: NEGATIVE (ref ?–200)
Cannabinoid 50 Ng, Ur ~~LOC~~: NEGATIVE (ref ?–50)
Cocaine Metabolite,Ur ~~LOC~~: NEGATIVE (ref ?–300)
MDMA (Ecstasy)Ur Screen: NEGATIVE (ref ?–500)
Opiate, Ur Screen: NEGATIVE (ref ?–300)

## 2012-07-15 LAB — ETHANOL: Ethanol %: 0.34 % (ref 0.000–0.080)

## 2012-07-21 ENCOUNTER — Ambulatory Visit: Payer: Self-pay | Admitting: Unknown Physician Specialty

## 2012-08-09 ENCOUNTER — Ambulatory Visit: Payer: Self-pay | Admitting: Unknown Physician Specialty

## 2012-08-27 ENCOUNTER — Inpatient Hospital Stay: Payer: Self-pay | Admitting: Internal Medicine

## 2012-08-27 LAB — CBC
MCH: 29.8 pg (ref 26.0–34.0)
MCV: 89 fL (ref 80–100)
Platelet: 101 10*3/uL — ABNORMAL LOW (ref 150–440)
RDW: 18.4 % — ABNORMAL HIGH (ref 11.5–14.5)
WBC: 9.6 10*3/uL (ref 3.8–10.6)

## 2012-08-27 LAB — URINALYSIS, COMPLETE
Blood: NEGATIVE
Hyaline Cast: 22
Nitrite: NEGATIVE
Protein: >=500
RBC,UR: 6 /HPF (ref 0–5)
WBC UR: 8 /HPF (ref 0–5)

## 2012-08-27 LAB — COMPREHENSIVE METABOLIC PANEL
Albumin: 3.7 g/dL (ref 3.4–5.0)
Alkaline Phosphatase: 101 U/L (ref 50–136)
BUN: 15 mg/dL (ref 7–18)
Calcium, Total: 8.9 mg/dL (ref 8.5–10.1)
Chloride: 97 mmol/L — ABNORMAL LOW (ref 98–107)
Co2: 30 mmol/L (ref 21–32)
EGFR (Non-African Amer.): >60
Glucose: 106 mg/dL — ABNORMAL HIGH (ref 65–99)
SGOT(AST): 64 U/L — ABNORMAL HIGH (ref 15–37)

## 2012-08-27 LAB — PROTIME-INR
INR: 0.9
Prothrombin Time: 13 secs (ref 11.5–14.7)

## 2012-08-27 LAB — ETHANOL: Ethanol: <3 mg/dL

## 2012-08-27 LAB — APTT: Activated PTT: 27.6 secs (ref 23.6–35.9)

## 2012-08-28 LAB — CBC WITH DIFFERENTIAL/PLATELET
Basophil #: 0 10*3/uL (ref 0.0–0.1)
Basophil %: 0.6 %
Eosinophil #: 0.3 10*3/uL (ref 0.0–0.7)
Eosinophil #: 0.3 10*3/uL (ref 0.0–0.7)
Eosinophil %: 1.6 %
HCT: 36.3 % — ABNORMAL LOW (ref 40.0–52.0)
HCT: 32.8 % — ABNORMAL LOW (ref 40.0–52.0)
Lymphocyte %: 15.3 %
Lymphocyte %: 9 %
MCH: 29.9 pg (ref 26.0–34.0)
MCH: 30.4 pg (ref 26.0–34.0)
MCHC: 33.1 g/dL (ref 32.0–36.0)
MCHC: 33.2 g/dL (ref 32.0–36.0)
Monocyte #: 1.6 x10 3/mm — ABNORMAL HIGH (ref 0.2–1.0)
Monocyte %: 6.9 %
Monocyte %: 9.4 %
Neutrophil #: 6.6 10*3/uL — ABNORMAL HIGH (ref 1.4–6.5)
Neutrophil #: 13.3 10*3/uL — ABNORMAL HIGH (ref 1.4–6.5)
Neutrophil %: 79.4 %
Platelet: 66 10*3/uL — ABNORMAL LOW (ref 150–440)
RBC: 4.02 10*6/uL — ABNORMAL LOW (ref 4.40–5.90)
RDW: 18.3 % — ABNORMAL HIGH (ref 11.5–14.5)
WBC: 8.9 10*3/uL (ref 3.8–10.6)
WBC: 16.7 10*3/uL — ABNORMAL HIGH (ref 3.8–10.6)

## 2012-08-28 LAB — BASIC METABOLIC PANEL
Anion Gap: 10 (ref 7–16)
BUN: 16 mg/dL (ref 7–18)
Calcium, Total: 8.6 mg/dL (ref 8.5–10.1)
Chloride: 99 mmol/L (ref 98–107)
Co2: 30 mmol/L (ref 21–32)
Creatinine: 1.2 mg/dL (ref 0.60–1.30)
EGFR (African American): >60
Osmolality: 276 (ref 275–301)

## 2012-08-28 LAB — POTASSIUM: Potassium: 3.9 mmol/L (ref 3.5–5.1)

## 2012-08-29 LAB — CBC WITH DIFFERENTIAL/PLATELET
Basophil #: 0 10*3/uL (ref 0.0–0.1)
Eosinophil #: 0.2 10*3/uL (ref 0.0–0.7)
Eosinophil %: 1.6 %
HCT: 25.1 % — ABNORMAL LOW (ref 40.0–52.0)
Lymphocyte %: 12.9 %
Monocyte %: 12.7 %
Neutrophil #: 7.6 10*3/uL — ABNORMAL HIGH (ref 1.4–6.5)
Neutrophil %: 72.5 %
Platelet: 63 10*3/uL — ABNORMAL LOW (ref 150–440)
RDW: 18.3 % — ABNORMAL HIGH (ref 11.5–14.5)
WBC: 10.5 10*3/uL (ref 3.8–10.6)

## 2012-08-29 LAB — BASIC METABOLIC PANEL
Anion Gap: 12 (ref 7–16)
Calcium, Total: 8 mg/dL — ABNORMAL LOW (ref 8.5–10.1)
Co2: 26 mmol/L (ref 21–32)
EGFR (African American): 52 — ABNORMAL LOW
Potassium: 4.3 mmol/L (ref 3.5–5.1)

## 2012-08-30 LAB — COMPREHENSIVE METABOLIC PANEL
Alkaline Phosphatase: 71 U/L (ref 50–136)
Calcium, Total: 8 mg/dL — ABNORMAL LOW (ref 8.5–10.1)
Chloride: 104 mmol/L (ref 98–107)
Co2: 22 mmol/L (ref 21–32)
Creatinine: 1.38 mg/dL — ABNORMAL HIGH (ref 0.60–1.30)
EGFR (African American): >60
EGFR (Non-African Amer.): 55 — ABNORMAL LOW
SGOT(AST): 49 U/L — ABNORMAL HIGH (ref 15–37)
SGPT (ALT): 20 U/L (ref 12–78)
Total Protein: 6 g/dL — ABNORMAL LOW (ref 6.4–8.2)

## 2012-08-30 LAB — CBC WITH DIFFERENTIAL/PLATELET
Basophil %: 0.5 %
Eosinophil #: 0.1 10*3/uL (ref 0.0–0.7)
Eosinophil %: 0.9 %
HCT: 24.6 % — ABNORMAL LOW (ref 40.0–52.0)
HGB: 8.3 g/dL — ABNORMAL LOW (ref 13.0–18.0)
Monocyte #: 1.6 x10 3/mm — ABNORMAL HIGH (ref 0.2–1.0)
Monocyte %: 17.6 %
Neutrophil #: 6.3 10*3/uL (ref 1.4–6.5)
Neutrophil %: 68.5 %
RBC: 2.63 10*6/uL — ABNORMAL LOW (ref 4.40–5.90)
WBC: 9.2 10*3/uL (ref 3.8–10.6)

## 2012-08-31 LAB — COMPREHENSIVE METABOLIC PANEL
Albumin: 2.4 g/dL — ABNORMAL LOW (ref 3.4–5.0)
Anion Gap: 12 (ref 7–16)
BUN: 27 mg/dL — ABNORMAL HIGH (ref 7–18)
Calcium, Total: 7.8 mg/dL — ABNORMAL LOW (ref 8.5–10.1)
Co2: 22 mmol/L (ref 21–32)
EGFR (African American): >60
EGFR (Non-African Amer.): >60
Glucose: 183 mg/dL — ABNORMAL HIGH (ref 65–99)
Osmolality: 287 (ref 275–301)
Potassium: 4.8 mmol/L (ref 3.5–5.1)
SGOT(AST): 42 U/L — ABNORMAL HIGH (ref 15–37)
SGPT (ALT): 20 U/L (ref 12–78)
Sodium: 139 mmol/L (ref 136–145)
Total Protein: 5.5 g/dL — ABNORMAL LOW (ref 6.4–8.2)

## 2012-08-31 LAB — CBC WITH DIFFERENTIAL/PLATELET
Basophil %: 0.3 %
Eosinophil #: 0 10*3/uL (ref 0.0–0.7)
HCT: 22.6 % — ABNORMAL LOW (ref 40.0–52.0)
HGB: 7.6 g/dL — ABNORMAL LOW (ref 13.0–18.0)
Lymphocyte #: 1 10*3/uL (ref 1.0–3.6)
Lymphocyte %: 14.1 %
Monocyte %: 16.4 %
Neutrophil #: 5.1 10*3/uL (ref 1.4–6.5)
Neutrophil %: 69 %
RBC: 2.44 10*6/uL — ABNORMAL LOW (ref 4.40–5.90)
RDW: 18.6 % — ABNORMAL HIGH (ref 11.5–14.5)
WBC: 7.4 10*3/uL (ref 3.8–10.6)

## 2012-08-31 LAB — HEMOGLOBIN: HGB: 9.3 g/dL — ABNORMAL LOW (ref 13.0–18.0)

## 2012-08-31 LAB — HEMOGLOBIN A1C: Hemoglobin A1C: <3.5 % — ABNORMAL LOW (ref 4.2–6.3)

## 2012-09-01 LAB — CBC WITH DIFFERENTIAL/PLATELET
Basophil #: 0.1 10*3/uL (ref 0.0–0.1)
Eosinophil #: 0.2 10*3/uL (ref 0.0–0.7)
HGB: 9.8 g/dL — ABNORMAL LOW (ref 13.0–18.0)
Lymphocyte %: 7.3 %
Monocyte #: 0.9 x10 3/mm (ref 0.2–1.0)
Monocyte %: 12.4 %
Neutrophil #: 5.5 10*3/uL (ref 1.4–6.5)
Neutrophil %: 76 %
Platelet: 105 10*3/uL — ABNORMAL LOW (ref 150–440)
RBC: 3.18 10*6/uL — ABNORMAL LOW (ref 4.40–5.90)
WBC: 7.2 10*3/uL (ref 3.8–10.6)

## 2012-09-01 LAB — BASIC METABOLIC PANEL
Anion Gap: 11 (ref 7–16)
BUN: 21 mg/dL — ABNORMAL HIGH (ref 7–18)
Calcium, Total: 8.2 mg/dL — ABNORMAL LOW (ref 8.5–10.1)
Chloride: 109 mmol/L — ABNORMAL HIGH (ref 98–107)
EGFR (African American): >60
EGFR (Non-African Amer.): >60
Glucose: 121 mg/dL — ABNORMAL HIGH (ref 65–99)
Osmolality: 287 (ref 275–301)
Potassium: 4 mmol/L (ref 3.5–5.1)

## 2012-09-01 LAB — MAGNESIUM: Magnesium: 1.5 mg/dL — ABNORMAL LOW

## 2012-09-02 LAB — CBC WITH DIFFERENTIAL/PLATELET
Basophil %: 0.3 %
Eosinophil #: 0 10*3/uL (ref 0.0–0.7)
HCT: 27.8 % — ABNORMAL LOW (ref 40.0–52.0)
HGB: 9.3 g/dL — ABNORMAL LOW (ref 13.0–18.0)
Lymphocyte #: 0.4 10*3/uL — ABNORMAL LOW (ref 1.0–3.6)
Lymphocyte %: 3.9 %
MCH: 30.7 pg (ref 26.0–34.0)
MCHC: 33.4 g/dL (ref 32.0–36.0)
MCV: 92 fL (ref 80–100)
Monocyte #: 0.5 x10 3/mm (ref 0.2–1.0)
Neutrophil #: 8.9 10*3/uL — ABNORMAL HIGH (ref 1.4–6.5)
RDW: 18.1 % — ABNORMAL HIGH (ref 11.5–14.5)

## 2012-09-02 LAB — COMPREHENSIVE METABOLIC PANEL
Alkaline Phosphatase: 66 U/L (ref 50–136)
BUN: 23 mg/dL — ABNORMAL HIGH (ref 7–18)
Bilirubin,Total: 0.8 mg/dL (ref 0.2–1.0)
Chloride: 108 mmol/L — ABNORMAL HIGH (ref 98–107)
Creatinine: 1 mg/dL (ref 0.60–1.30)
EGFR (African American): >60
EGFR (Non-African Amer.): >60
Glucose: 208 mg/dL — ABNORMAL HIGH (ref 65–99)
Potassium: 3.9 mmol/L (ref 3.5–5.1)
SGOT(AST): 26 U/L (ref 15–37)
SGPT (ALT): 21 U/L (ref 12–78)
Total Protein: 5.5 g/dL — ABNORMAL LOW (ref 6.4–8.2)

## 2012-09-02 LAB — FOLATE: Folic Acid: 13.3 ng/mL (ref 3.1–100.0)

## 2012-09-02 LAB — AMMONIA: Ammonia, Plasma: <25 mcmol/L (ref 11–32)

## 2012-09-03 LAB — URINALYSIS, COMPLETE
Glucose,UR: NEGATIVE mg/dL (ref 0–75)
Protein: NEGATIVE
RBC,UR: 3 /HPF (ref 0–5)
Specific Gravity: 1.014 (ref 1.003–1.030)
Squamous Epithelial: NONE SEEN
WBC UR: 1 /HPF (ref 0–5)

## 2012-09-04 LAB — CBC WITH DIFFERENTIAL/PLATELET
Basophil #: 0 10*3/uL (ref 0.0–0.1)
Basophil %: 0.4 %
Eosinophil #: 0 10*3/uL (ref 0.0–0.7)
Eosinophil %: 0.1 %
HCT: 30.4 % — ABNORMAL LOW (ref 40.0–52.0)
HGB: 9.9 g/dL — ABNORMAL LOW (ref 13.0–18.0)
Lymphocyte %: 6.8 %
MCV: 94 fL (ref 80–100)
Monocyte %: 7.5 %
Neutrophil #: 8.4 10*3/uL — ABNORMAL HIGH (ref 1.4–6.5)
Neutrophil %: 85.2 %
Platelet: 166 10*3/uL (ref 150–440)
RBC: 3.25 10*6/uL — ABNORMAL LOW (ref 4.40–5.90)
WBC: 9.8 10*3/uL (ref 3.8–10.6)

## 2012-09-04 LAB — BASIC METABOLIC PANEL
Anion Gap: 9 (ref 7–16)
Calcium, Total: 7.5 mg/dL — ABNORMAL LOW (ref 8.5–10.1)
Chloride: 117 mmol/L — ABNORMAL HIGH (ref 98–107)
Co2: 22 mmol/L (ref 21–32)
EGFR (African American): >60
Osmolality: 302 (ref 275–301)
Potassium: 3.1 mmol/L — ABNORMAL LOW (ref 3.5–5.1)

## 2012-09-05 LAB — BASIC METABOLIC PANEL
Anion Gap: 8 (ref 7–16)
BUN: 29 mg/dL — ABNORMAL HIGH (ref 7–18)
Calcium, Total: 7.6 mg/dL — ABNORMAL LOW (ref 8.5–10.1)
Chloride: 116 mmol/L — ABNORMAL HIGH (ref 98–107)
Co2: 23 mmol/L (ref 21–32)
Creatinine: 1.06 mg/dL (ref 0.60–1.30)

## 2012-09-05 LAB — CBC WITH DIFFERENTIAL/PLATELET
Eosinophil #: 0 10*3/uL (ref 0.0–0.7)
Eosinophil %: 0.6 %
Lymphocyte %: 17.8 %
MCV: 93 fL (ref 80–100)
Monocyte %: 9.8 %
Neutrophil %: 71.6 %
Platelet: 144 10*3/uL — ABNORMAL LOW (ref 150–440)
RBC: 3.21 10*6/uL — ABNORMAL LOW (ref 4.40–5.90)
RDW: 19.4 % — ABNORMAL HIGH (ref 11.5–14.5)
WBC: 6.1 10*3/uL (ref 3.8–10.6)

## 2012-09-05 LAB — PHOSPHORUS: Phosphorus: 2 mg/dL — ABNORMAL LOW (ref 2.5–4.9)

## 2012-09-05 LAB — MAGNESIUM: Magnesium: 1.7 mg/dL — ABNORMAL LOW

## 2012-09-05 LAB — POTASSIUM: Potassium: 3.4 mmol/L — ABNORMAL LOW (ref 3.5–5.1)

## 2012-09-06 LAB — CBC WITH DIFFERENTIAL/PLATELET
Basophil #: 0 10*3/uL (ref 0.0–0.1)
Basophil %: 0.5 %
Eosinophil %: 1.2 %
HCT: 27.6 % — ABNORMAL LOW (ref 40.0–52.0)
HGB: 9.2 g/dL — ABNORMAL LOW (ref 13.0–18.0)
Lymphocyte #: 1 10*3/uL (ref 1.0–3.6)
Lymphocyte %: 11.3 %
MCH: 30.9 pg (ref 26.0–34.0)
Monocyte #: 0.7 x10 3/mm (ref 0.2–1.0)
Monocyte %: 8.2 %
Neutrophil %: 78.8 %
Platelet: 130 10*3/uL — ABNORMAL LOW (ref 150–440)
RBC: 2.97 10*6/uL — ABNORMAL LOW (ref 4.40–5.90)
WBC: 8.4 10*3/uL (ref 3.8–10.6)

## 2012-09-06 LAB — MAGNESIUM: Magnesium: 1.7 mg/dL — ABNORMAL LOW

## 2012-09-06 LAB — BASIC METABOLIC PANEL
Calcium, Total: 7.4 mg/dL — ABNORMAL LOW (ref 8.5–10.1)
Chloride: 113 mmol/L — ABNORMAL HIGH (ref 98–107)
Co2: 23 mmol/L (ref 21–32)
EGFR (Non-African Amer.): >60
Glucose: 202 mg/dL — ABNORMAL HIGH (ref 65–99)
Osmolality: 292 (ref 275–301)
Potassium: 3.7 mmol/L (ref 3.5–5.1)
Sodium: 142 mmol/L (ref 136–145)

## 2012-09-07 ENCOUNTER — Ambulatory Visit: Payer: Self-pay | Admitting: Internal Medicine

## 2012-09-07 LAB — URINALYSIS, COMPLETE
Bacteria: NONE SEEN
Bilirubin,UR: NEGATIVE
Glucose,UR: NEGATIVE mg/dL (ref 0–75)
Ketone: NEGATIVE
Specific Gravity: 1.013 (ref 1.003–1.030)
Squamous Epithelial: NONE SEEN
WBC UR: 9 /HPF (ref 0–5)

## 2012-09-07 LAB — BASIC METABOLIC PANEL
BUN: 19 mg/dL — ABNORMAL HIGH (ref 7–18)
Calcium, Total: 7.4 mg/dL — ABNORMAL LOW (ref 8.5–10.1)
Chloride: 108 mmol/L — ABNORMAL HIGH (ref 98–107)
Creatinine: 1.07 mg/dL (ref 0.60–1.30)
Potassium: 3.1 mmol/L — ABNORMAL LOW (ref 3.5–5.1)

## 2012-09-08 ENCOUNTER — Ambulatory Visit: Payer: Self-pay | Admitting: Internal Medicine

## 2012-09-08 LAB — BASIC METABOLIC PANEL
Anion Gap: 8 (ref 7–16)
Calcium, Total: 7.7 mg/dL — ABNORMAL LOW (ref 8.5–10.1)
EGFR (African American): >60
EGFR (Non-African Amer.): >60
Glucose: 217 mg/dL — ABNORMAL HIGH (ref 65–99)
Osmolality: 292 (ref 275–301)
Sodium: 142 mmol/L (ref 136–145)

## 2012-09-08 LAB — MAGNESIUM: Magnesium: 1.3 mg/dL — ABNORMAL LOW

## 2012-09-09 LAB — BASIC METABOLIC PANEL
Anion Gap: 9 (ref 7–16)
BUN: 18 mg/dL (ref 7–18)
Calcium, Total: 7.8 mg/dL — ABNORMAL LOW (ref 8.5–10.1)
Chloride: 101 mmol/L (ref 98–107)
Creatinine: 0.95 mg/dL (ref 0.60–1.30)
EGFR (African American): >60
Glucose: 156 mg/dL — ABNORMAL HIGH (ref 65–99)
Osmolality: 286 (ref 275–301)

## 2012-09-09 LAB — PHOSPHORUS: Phosphorus: 2.9 mg/dL (ref 2.5–4.9)

## 2012-09-09 LAB — VANCOMYCIN, TROUGH: Vancomycin, Trough: 22 ug/mL (ref 10–20)

## 2012-09-10 LAB — BASIC METABOLIC PANEL
BUN: 21 mg/dL — ABNORMAL HIGH (ref 7–18)
Calcium, Total: 7.7 mg/dL — ABNORMAL LOW (ref 8.5–10.1)
Chloride: 103 mmol/L (ref 98–107)
Co2: 32 mmol/L (ref 21–32)
Creatinine: 1.21 mg/dL (ref 0.60–1.30)
Glucose: 216 mg/dL — ABNORMAL HIGH (ref 65–99)
Osmolality: 293 (ref 275–301)
Potassium: 3.7 mmol/L (ref 3.5–5.1)

## 2012-09-11 LAB — CBC WITH DIFFERENTIAL/PLATELET
Basophil %: 1.3 %
Eosinophil #: 0.2 10*3/uL (ref 0.0–0.7)
Eosinophil %: 2.9 %
HCT: 24.1 % — ABNORMAL LOW (ref 40.0–52.0)
HGB: 7.9 g/dL — ABNORMAL LOW (ref 13.0–18.0)
MCH: 30.8 pg (ref 26.0–34.0)
MCHC: 33 g/dL (ref 32.0–36.0)
Monocyte #: 0.7 x10 3/mm (ref 0.2–1.0)
Neutrophil #: 5.2 10*3/uL (ref 1.4–6.5)
RBC: 2.58 10*6/uL — ABNORMAL LOW (ref 4.40–5.90)

## 2012-09-11 LAB — FOLATE: Folic Acid: 13 ng/mL (ref 3.1–100.0)

## 2012-09-11 LAB — BASIC METABOLIC PANEL
Anion Gap: 8 (ref 7–16)
Calcium, Total: 7.9 mg/dL — ABNORMAL LOW (ref 8.5–10.1)
Chloride: 102 mmol/L (ref 98–107)
Co2: 32 mmol/L (ref 21–32)
EGFR (Non-African Amer.): >60
Potassium: 3.9 mmol/L (ref 3.5–5.1)
Sodium: 142 mmol/L (ref 136–145)

## 2012-09-11 LAB — IRON AND TIBC
Iron Bind.Cap.(Total): 269 ug/dL (ref 250–450)
Unbound Iron-Bind.Cap.: 220 ug/dL

## 2012-09-12 LAB — BASIC METABOLIC PANEL
Anion Gap: 7 (ref 7–16)
Calcium, Total: 8.1 mg/dL — ABNORMAL LOW (ref 8.5–10.1)
Chloride: 100 mmol/L (ref 98–107)
Co2: 34 mmol/L — ABNORMAL HIGH (ref 21–32)
EGFR (African American): >60
Glucose: 187 mg/dL — ABNORMAL HIGH (ref 65–99)
Osmolality: 288 (ref 275–301)
Sodium: 141 mmol/L (ref 136–145)

## 2012-09-12 LAB — CULTURE, BLOOD (SINGLE)

## 2012-09-12 LAB — VANCOMYCIN, TROUGH: Vancomycin, Trough: 26 ug/mL (ref 10–20)

## 2012-09-12 LAB — CBC WITH DIFFERENTIAL/PLATELET
Basophil %: 0.7 %
Eosinophil #: 0.3 10*3/uL (ref 0.0–0.7)
Eosinophil %: 2.6 %
MCHC: 32 g/dL (ref 32.0–36.0)
MCV: 93 fL (ref 80–100)
Monocyte %: 7.7 %
Neutrophil #: 7.9 10*3/uL — ABNORMAL HIGH (ref 1.4–6.5)
Neutrophil %: 78.7 %
Platelet: 111 10*3/uL — ABNORMAL LOW (ref 150–440)
RDW: 19.6 % — ABNORMAL HIGH (ref 11.5–14.5)
WBC: 10.1 10*3/uL (ref 3.8–10.6)

## 2012-09-12 LAB — MAGNESIUM: Magnesium: 1.5 mg/dL — ABNORMAL LOW

## 2012-09-12 LAB — EXPECTORATED SPUTUM ASSESSMENT W GRAM STAIN, RFLX TO RESP C

## 2012-09-13 LAB — CBC WITH DIFFERENTIAL/PLATELET
Basophil #: 0.1 10*3/uL (ref 0.0–0.1)
Eosinophil #: 0.3 10*3/uL (ref 0.0–0.7)
Eosinophil %: 3.5 %
HGB: 7.8 g/dL — ABNORMAL LOW (ref 13.0–18.0)
Lymphocyte #: 0.9 10*3/uL — ABNORMAL LOW (ref 1.0–3.6)
MCH: 31.9 pg (ref 26.0–34.0)
MCHC: 34.4 g/dL (ref 32.0–36.0)
MCV: 93 fL (ref 80–100)
Monocyte %: 8.5 %
Neutrophil #: 6.9 10*3/uL — ABNORMAL HIGH (ref 1.4–6.5)
Platelet: 127 10*3/uL — ABNORMAL LOW (ref 150–440)
RDW: 19.1 % — ABNORMAL HIGH (ref 11.5–14.5)
WBC: 8.9 10*3/uL (ref 3.8–10.6)

## 2012-09-13 LAB — MAGNESIUM: Magnesium: 1.8 mg/dL

## 2012-09-13 LAB — BASIC METABOLIC PANEL
Anion Gap: 6 — ABNORMAL LOW (ref 7–16)
BUN: 22 mg/dL — ABNORMAL HIGH (ref 7–18)
Chloride: 98 mmol/L (ref 98–107)
Co2: 38 mmol/L — ABNORMAL HIGH (ref 21–32)
Osmolality: 290 (ref 275–301)
Potassium: 3.9 mmol/L (ref 3.5–5.1)

## 2012-09-14 LAB — CBC WITH DIFFERENTIAL/PLATELET
Basophil #: 0.1 10*3/uL (ref 0.0–0.1)
Basophil %: 0.9 %
Eosinophil #: 0.2 10*3/uL (ref 0.0–0.7)
HCT: 23.1 % — ABNORMAL LOW (ref 40.0–52.0)
HGB: 7.7 g/dL — ABNORMAL LOW (ref 13.0–18.0)
Lymphocyte %: 13 %
MCV: 92 fL (ref 80–100)
Monocyte #: 0.7 x10 3/mm (ref 0.2–1.0)
Neutrophil #: 6.8 10*3/uL — ABNORMAL HIGH (ref 1.4–6.5)
Platelet: 143 10*3/uL — ABNORMAL LOW (ref 150–440)
RBC: 2.51 10*6/uL — ABNORMAL LOW (ref 4.40–5.90)
RDW: 18.7 % — ABNORMAL HIGH (ref 11.5–14.5)
WBC: 8.9 10*3/uL (ref 3.8–10.6)

## 2012-09-14 LAB — BASIC METABOLIC PANEL
Calcium, Total: 8.1 mg/dL — ABNORMAL LOW (ref 8.5–10.1)
Creatinine: 0.99 mg/dL (ref 0.60–1.30)
EGFR (African American): >60
EGFR (Non-African Amer.): >60
Glucose: 239 mg/dL — ABNORMAL HIGH (ref 65–99)
Osmolality: 292 (ref 275–301)
Potassium: 3.4 mmol/L — ABNORMAL LOW (ref 3.5–5.1)
Sodium: 141 mmol/L (ref 136–145)

## 2012-09-15 LAB — MAGNESIUM: Magnesium: 1.5 mg/dL — ABNORMAL LOW

## 2012-09-15 LAB — POTASSIUM: Potassium: 3 mmol/L — ABNORMAL LOW (ref 3.5–5.1)

## 2012-09-16 LAB — CBC WITH DIFFERENTIAL/PLATELET
Basophil %: 1.1 %
Eosinophil #: 0.4 10*3/uL (ref 0.0–0.7)
Eosinophil %: 4.4 %
Lymphocyte %: 13.4 %
MCH: 30.7 pg (ref 26.0–34.0)
MCHC: 33.7 g/dL (ref 32.0–36.0)
MCV: 91 fL (ref 80–100)
Monocyte #: 0.6 x10 3/mm (ref 0.2–1.0)
Monocyte %: 6.6 %
Neutrophil #: 6.5 10*3/uL (ref 1.4–6.5)
Platelet: 188 10*3/uL (ref 150–440)
RDW: 18.4 % — ABNORMAL HIGH (ref 11.5–14.5)
WBC: 8.8 10*3/uL (ref 3.8–10.6)

## 2012-09-16 LAB — BASIC METABOLIC PANEL
BUN: 19 mg/dL — ABNORMAL HIGH (ref 7–18)
Co2: 31 mmol/L (ref 21–32)
EGFR (Non-African Amer.): >60
Glucose: 186 mg/dL — ABNORMAL HIGH (ref 65–99)
Potassium: 3.6 mmol/L (ref 3.5–5.1)
Sodium: 143 mmol/L (ref 136–145)

## 2012-09-18 LAB — BASIC METABOLIC PANEL
Anion Gap: 7 (ref 7–16)
BUN: 16 mg/dL (ref 7–18)
Calcium, Total: 8.3 mg/dL — ABNORMAL LOW (ref 8.5–10.1)
Co2: 28 mmol/L (ref 21–32)
Creatinine: 0.87 mg/dL (ref 0.60–1.30)
EGFR (African American): >60
EGFR (Non-African Amer.): >60
Glucose: 187 mg/dL — ABNORMAL HIGH (ref 65–99)

## 2012-09-18 LAB — CBC WITH DIFFERENTIAL/PLATELET
Basophil #: 0.1 10*3/uL (ref 0.0–0.1)
Basophil %: 1.1 %
Eosinophil #: 0.2 10*3/uL (ref 0.0–0.7)
HCT: 26.9 % — ABNORMAL LOW (ref 40.0–52.0)
HGB: 8.9 g/dL — ABNORMAL LOW (ref 13.0–18.0)
Lymphocyte #: 1 10*3/uL (ref 1.0–3.6)
Lymphocyte %: 11.7 %
MCHC: 33.1 g/dL (ref 32.0–36.0)
MCV: 91 fL (ref 80–100)
Monocyte %: 6.9 %
Neutrophil #: 6.6 10*3/uL — ABNORMAL HIGH (ref 1.4–6.5)
RBC: 2.95 10*6/uL — ABNORMAL LOW (ref 4.40–5.90)
RDW: 18.3 % — ABNORMAL HIGH (ref 11.5–14.5)
WBC: 8.6 10*3/uL (ref 3.8–10.6)

## 2012-09-18 LAB — CLOSTRIDIUM DIFFICILE BY PCR

## 2012-09-19 LAB — BASIC METABOLIC PANEL
Anion Gap: 9 (ref 7–16)
Calcium, Total: 8.5 mg/dL (ref 8.5–10.1)
Chloride: 110 mmol/L — ABNORMAL HIGH (ref 98–107)
Co2: 25 mmol/L (ref 21–32)
Creatinine: 0.98 mg/dL (ref 0.60–1.30)
EGFR (Non-African Amer.): >60
Osmolality: 290 (ref 275–301)

## 2012-09-19 LAB — MAGNESIUM: Magnesium: 1.9 mg/dL

## 2012-09-20 LAB — CBC WITH DIFFERENTIAL/PLATELET
Basophil #: 0.1 10*3/uL (ref 0.0–0.1)
HCT: 27.5 % — ABNORMAL LOW (ref 40.0–52.0)
HGB: 8.8 g/dL — ABNORMAL LOW (ref 13.0–18.0)
Lymphocyte #: 1.5 10*3/uL (ref 1.0–3.6)
Monocyte #: 0.8 x10 3/mm (ref 0.2–1.0)
Monocyte %: 8.9 %
Neutrophil #: 5.9 10*3/uL (ref 1.4–6.5)
Neutrophil %: 65.3 %
Platelet: 273 10*3/uL (ref 150–440)
RBC: 2.96 10*6/uL — ABNORMAL LOW (ref 4.40–5.90)
RDW: 18.4 % — ABNORMAL HIGH (ref 11.5–14.5)
WBC: 9 10*3/uL (ref 3.8–10.6)

## 2012-09-20 LAB — BASIC METABOLIC PANEL
Anion Gap: 8 (ref 7–16)
Chloride: 110 mmol/L — ABNORMAL HIGH (ref 98–107)
Co2: 25 mmol/L (ref 21–32)
Creatinine: 1.15 mg/dL (ref 0.60–1.30)
EGFR (Non-African Amer.): >60
Sodium: 143 mmol/L (ref 136–145)

## 2012-09-20 LAB — OCCULT BLOOD X 1 CARD TO LAB, STOOL: Occult Blood, Feces: NEGATIVE

## 2012-09-23 LAB — BASIC METABOLIC PANEL
BUN: 34 mg/dL — ABNORMAL HIGH (ref 7–18)
Calcium, Total: 8.4 mg/dL — ABNORMAL LOW (ref 8.5–10.1)
Chloride: 104 mmol/L (ref 98–107)
Co2: 27 mmol/L (ref 21–32)
EGFR (African American): >60
Osmolality: 290 (ref 275–301)
Potassium: 4.2 mmol/L (ref 3.5–5.1)

## 2012-09-23 LAB — CBC WITH DIFFERENTIAL/PLATELET
Basophil #: 0 10*3/uL (ref 0.0–0.1)
Eosinophil #: 0.6 10*3/uL (ref 0.0–0.7)
HCT: 29.3 % — ABNORMAL LOW (ref 40.0–52.0)
Lymphocyte #: 1.2 10*3/uL (ref 1.0–3.6)
MCH: 29 pg (ref 26.0–34.0)
MCHC: 31.4 g/dL — ABNORMAL LOW (ref 32.0–36.0)
MCV: 93 fL (ref 80–100)
Monocyte #: 0.8 x10 3/mm (ref 0.2–1.0)
Monocyte %: 10.6 %
Neutrophil #: 5.3 10*3/uL (ref 1.4–6.5)
Platelet: 255 10*3/uL (ref 150–440)
RDW: 17.7 % — ABNORMAL HIGH (ref 11.5–14.5)
WBC: 7.9 10*3/uL (ref 3.8–10.6)

## 2012-09-26 ENCOUNTER — Emergency Department: Payer: Self-pay | Admitting: Emergency Medicine

## 2012-11-26 ENCOUNTER — Ambulatory Visit: Payer: Self-pay | Admitting: Specialist

## 2013-01-17 DIAGNOSIS — E119 Type 2 diabetes mellitus without complications: Secondary | ICD-10-CM | POA: Diagnosis not present

## 2013-01-17 DIAGNOSIS — J449 Chronic obstructive pulmonary disease, unspecified: Secondary | ICD-10-CM | POA: Diagnosis not present

## 2013-01-17 DIAGNOSIS — B1920 Unspecified viral hepatitis C without hepatic coma: Secondary | ICD-10-CM | POA: Diagnosis not present

## 2013-02-02 ENCOUNTER — Ambulatory Visit: Payer: Self-pay | Admitting: Gastroenterology

## 2013-02-02 DIAGNOSIS — K648 Other hemorrhoids: Secondary | ICD-10-CM | POA: Diagnosis not present

## 2013-02-02 DIAGNOSIS — Z8371 Family history of colonic polyps: Secondary | ICD-10-CM | POA: Diagnosis not present

## 2013-02-02 DIAGNOSIS — Z1211 Encounter for screening for malignant neoplasm of colon: Secondary | ICD-10-CM | POA: Diagnosis not present

## 2013-02-02 DIAGNOSIS — K759 Inflammatory liver disease, unspecified: Secondary | ICD-10-CM | POA: Diagnosis not present

## 2013-02-02 DIAGNOSIS — Z980 Intestinal bypass and anastomosis status: Secondary | ICD-10-CM | POA: Diagnosis not present

## 2013-02-02 DIAGNOSIS — I10 Essential (primary) hypertension: Secondary | ICD-10-CM | POA: Diagnosis not present

## 2013-02-02 DIAGNOSIS — F17200 Nicotine dependence, unspecified, uncomplicated: Secondary | ICD-10-CM | POA: Diagnosis not present

## 2013-02-02 DIAGNOSIS — D126 Benign neoplasm of colon, unspecified: Secondary | ICD-10-CM | POA: Diagnosis not present

## 2013-02-02 DIAGNOSIS — D649 Anemia, unspecified: Secondary | ICD-10-CM | POA: Diagnosis not present

## 2013-02-02 DIAGNOSIS — E119 Type 2 diabetes mellitus without complications: Secondary | ICD-10-CM | POA: Diagnosis not present

## 2013-02-02 DIAGNOSIS — F329 Major depressive disorder, single episode, unspecified: Secondary | ICD-10-CM | POA: Diagnosis not present

## 2013-02-02 DIAGNOSIS — Z79899 Other long term (current) drug therapy: Secondary | ICD-10-CM | POA: Diagnosis not present

## 2013-02-02 DIAGNOSIS — R918 Other nonspecific abnormal finding of lung field: Secondary | ICD-10-CM | POA: Diagnosis not present

## 2013-02-02 DIAGNOSIS — K5730 Diverticulosis of large intestine without perforation or abscess without bleeding: Secondary | ICD-10-CM | POA: Diagnosis not present

## 2013-02-02 DIAGNOSIS — J449 Chronic obstructive pulmonary disease, unspecified: Secondary | ICD-10-CM | POA: Diagnosis not present

## 2013-02-02 DIAGNOSIS — K8010 Calculus of gallbladder with chronic cholecystitis without obstruction: Secondary | ICD-10-CM | POA: Diagnosis not present

## 2013-02-02 LAB — CBC WITH DIFFERENTIAL/PLATELET
Basophil %: 1.4 %
Eosinophil #: 0.2 10*3/uL (ref 0.0–0.7)
Eosinophil %: 2.8 %
HGB: 13.4 g/dL (ref 13.0–18.0)
Lymphocyte #: 1.2 10*3/uL (ref 1.0–3.6)
Lymphocyte %: 15.7 %
MCH: 30.2 pg (ref 26.0–34.0)
MCV: 92 fL (ref 80–100)
Monocyte #: 0.6 x10 3/mm (ref 0.2–1.0)
Neutrophil %: 72.7 %
Platelet: 126 10*3/uL — ABNORMAL LOW (ref 150–440)
RBC: 4.44 10*6/uL (ref 4.40–5.90)
RDW: 16.1 % — ABNORMAL HIGH (ref 11.5–14.5)

## 2013-02-02 LAB — PROTIME-INR
INR: 1
Prothrombin Time: 13.3 secs (ref 11.5–14.7)

## 2013-02-03 LAB — PATHOLOGY REPORT

## 2013-02-25 ENCOUNTER — Ambulatory Visit: Payer: Self-pay | Admitting: Specialist

## 2013-02-25 DIAGNOSIS — R222 Localized swelling, mass and lump, trunk: Secondary | ICD-10-CM | POA: Diagnosis not present

## 2013-02-25 DIAGNOSIS — R918 Other nonspecific abnormal finding of lung field: Secondary | ICD-10-CM | POA: Diagnosis not present

## 2013-03-01 DIAGNOSIS — E785 Hyperlipidemia, unspecified: Secondary | ICD-10-CM | POA: Diagnosis not present

## 2013-03-01 DIAGNOSIS — Z125 Encounter for screening for malignant neoplasm of prostate: Secondary | ICD-10-CM | POA: Diagnosis not present

## 2013-03-01 DIAGNOSIS — E119 Type 2 diabetes mellitus without complications: Secondary | ICD-10-CM | POA: Diagnosis not present

## 2013-03-01 DIAGNOSIS — I10 Essential (primary) hypertension: Secondary | ICD-10-CM | POA: Diagnosis not present

## 2013-03-01 DIAGNOSIS — E7800 Pure hypercholesterolemia, unspecified: Secondary | ICD-10-CM | POA: Diagnosis not present

## 2013-03-01 DIAGNOSIS — Z0000 Encounter for general adult medical examination without abnormal findings: Secondary | ICD-10-CM | POA: Diagnosis not present

## 2013-03-03 DIAGNOSIS — J984 Other disorders of lung: Secondary | ICD-10-CM | POA: Diagnosis not present

## 2013-03-04 DIAGNOSIS — R05 Cough: Secondary | ICD-10-CM | POA: Diagnosis not present

## 2013-06-03 ENCOUNTER — Ambulatory Visit: Payer: Self-pay | Admitting: Specialist

## 2013-06-03 DIAGNOSIS — R918 Other nonspecific abnormal finding of lung field: Secondary | ICD-10-CM | POA: Diagnosis not present

## 2013-06-03 DIAGNOSIS — R222 Localized swelling, mass and lump, trunk: Secondary | ICD-10-CM | POA: Diagnosis not present

## 2013-06-10 DIAGNOSIS — J984 Other disorders of lung: Secondary | ICD-10-CM | POA: Diagnosis not present

## 2013-06-10 DIAGNOSIS — J449 Chronic obstructive pulmonary disease, unspecified: Secondary | ICD-10-CM | POA: Diagnosis not present

## 2013-06-15 DIAGNOSIS — R972 Elevated prostate specific antigen [PSA]: Secondary | ICD-10-CM | POA: Diagnosis not present

## 2013-06-15 DIAGNOSIS — E7800 Pure hypercholesterolemia, unspecified: Secondary | ICD-10-CM | POA: Diagnosis not present

## 2013-06-15 DIAGNOSIS — E119 Type 2 diabetes mellitus without complications: Secondary | ICD-10-CM | POA: Diagnosis not present

## 2013-06-15 DIAGNOSIS — I10 Essential (primary) hypertension: Secondary | ICD-10-CM | POA: Diagnosis not present

## 2013-07-21 DIAGNOSIS — I10 Essential (primary) hypertension: Secondary | ICD-10-CM | POA: Diagnosis not present

## 2013-09-25 IMAGING — CT CT ABD-PELV W/ CM
1 of 2 series · 15 of 32 positions shown, 19 images · IV contrast (isovue)
Comparison: none

REASON FOR EXAM: (1) abd pain; (2) pel pain
COMMENTS:

PROCEDURE:     CT  - CT ABDOMEN / PELVIS  W  - February 01, 2012 [DATE]
RESULT:     Comparison:  10/17/2009
TECHNIQUE: Multiple axial images of the abdomen and pelvis were performed
from the lung bases to the pubic symphysis, with p.o. contrast and with 100
mL of Isovue 300 intravenous contrast.

[Series 2: 3mm soft tissue · axial · 0.76mm/px · z∈[-781,-322]mm · 15 of 167 slices shown, 19 images]
[im 7/167  soft-tissue]
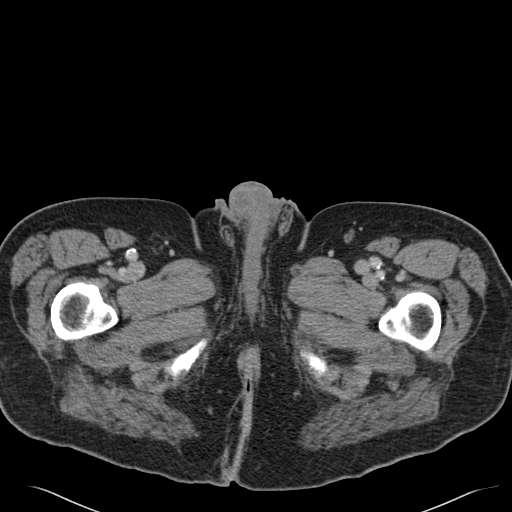
[im 7/167  bone]
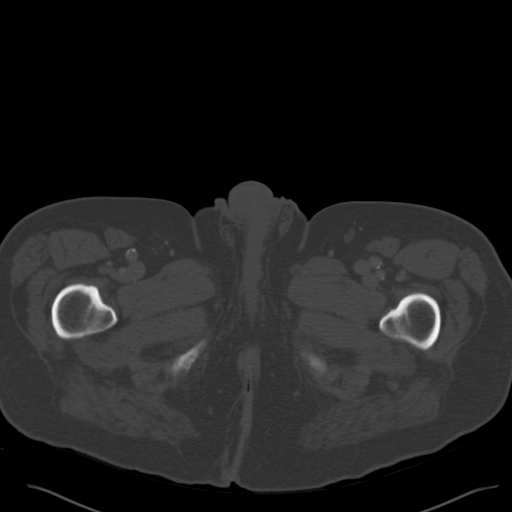
[im 21/167  soft-tissue]
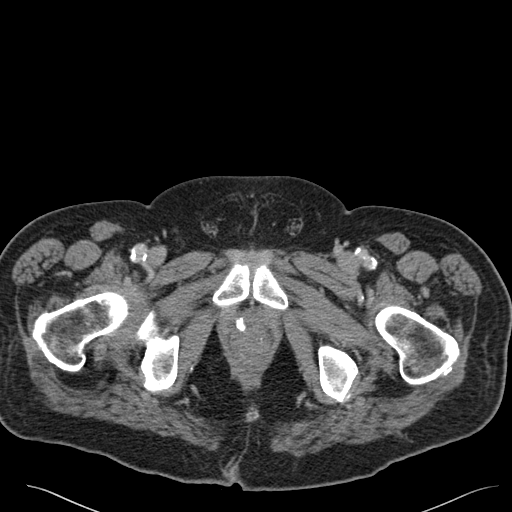
[im 35/167  soft-tissue]
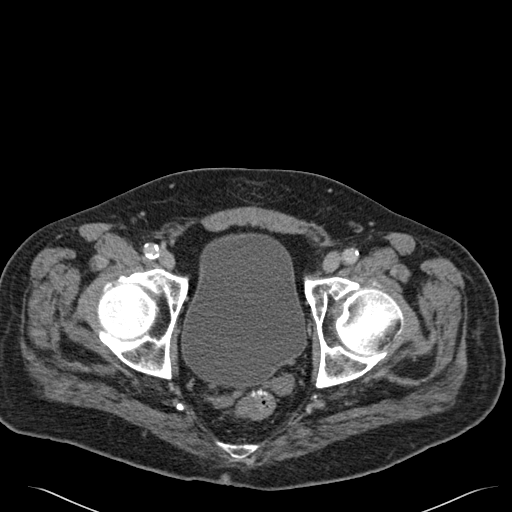
[im 49/167  soft-tissue]
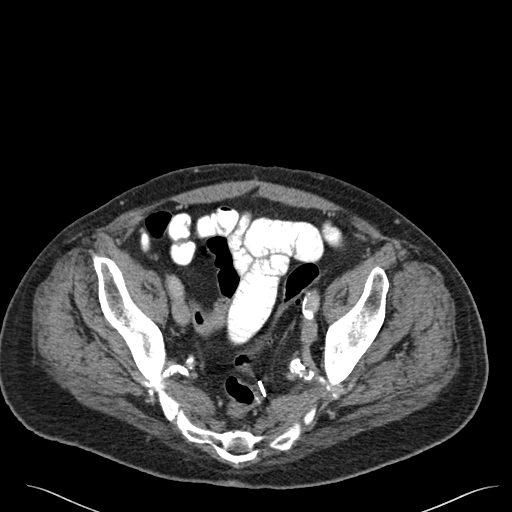
[im 56/167  soft-tissue]
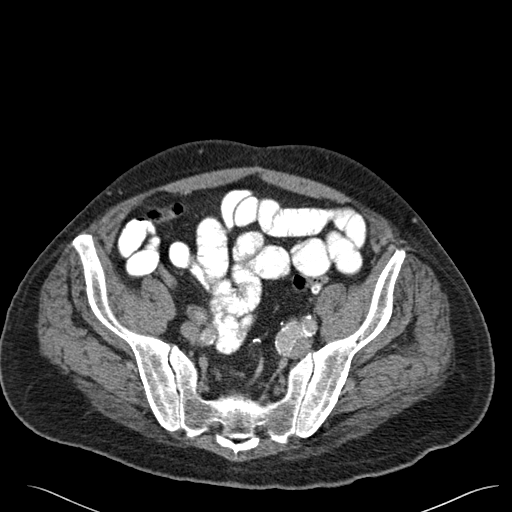
[im 70/167  soft-tissue]
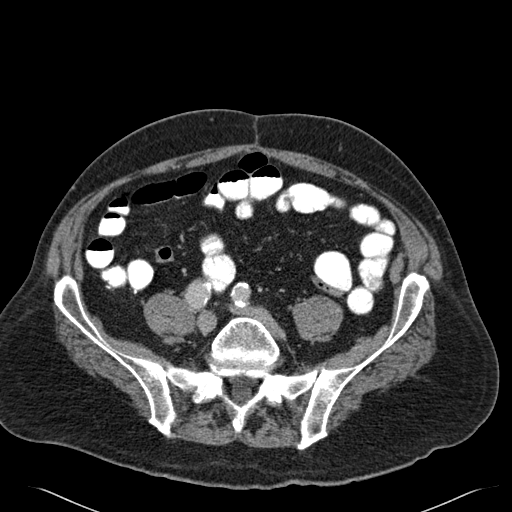
[im 84/167  soft-tissue]
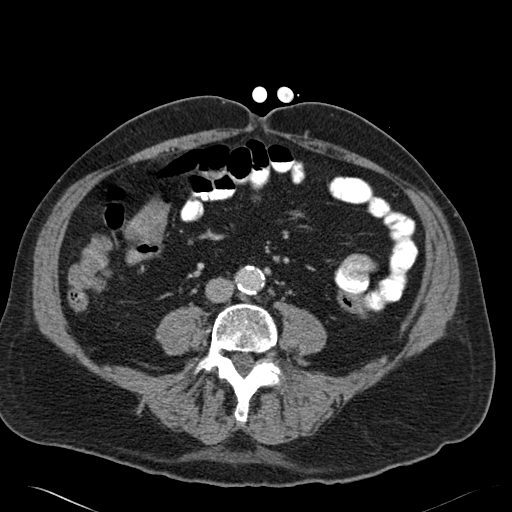
[im 97/167  soft-tissue]
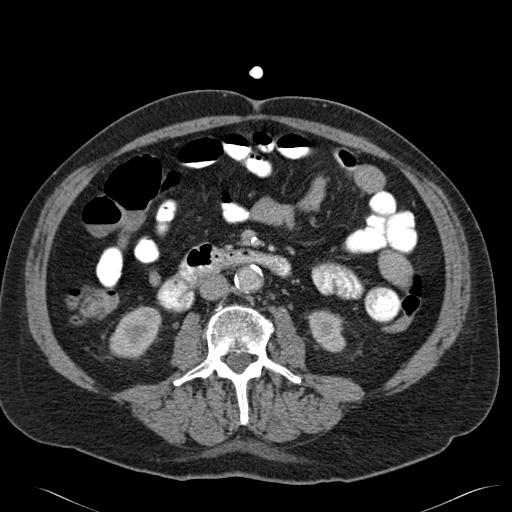
[im 111/167  soft-tissue]
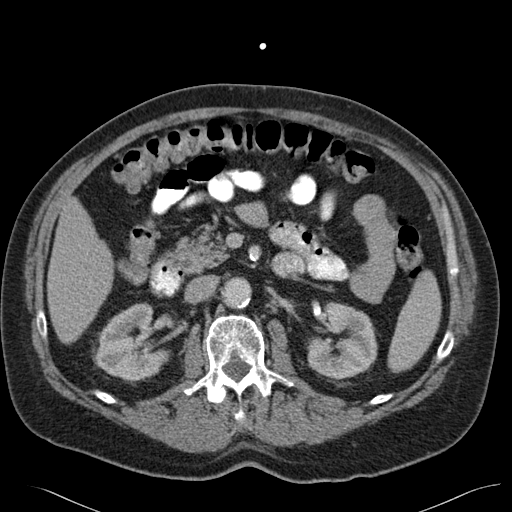
[im 111/167  bone]
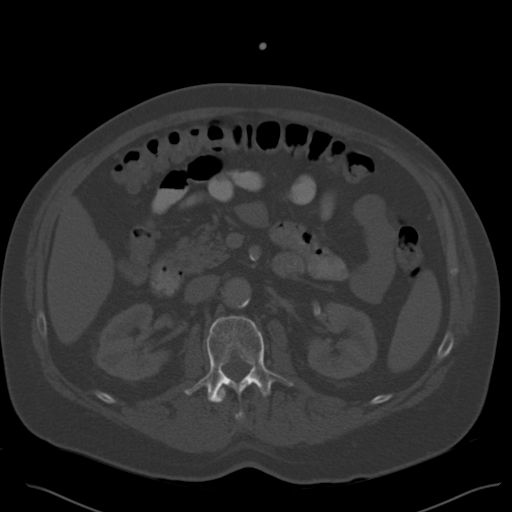
[im 118/167  soft-tissue]
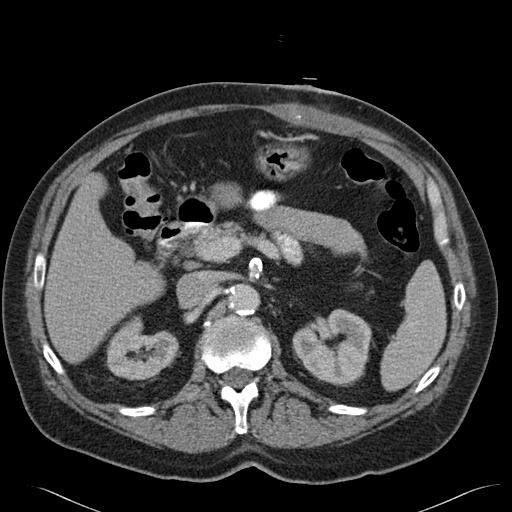
[im 132/167  soft-tissue]
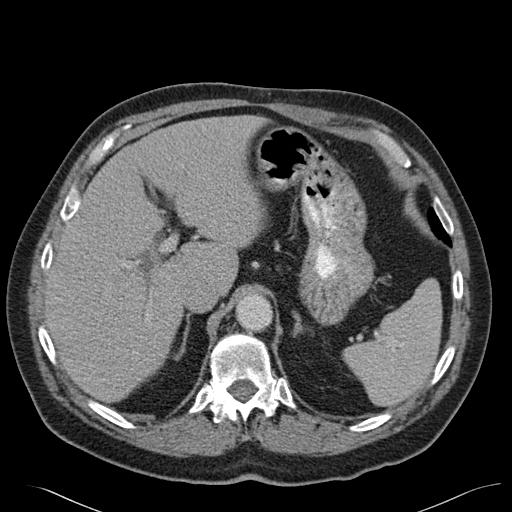
[im 139/167  lung]
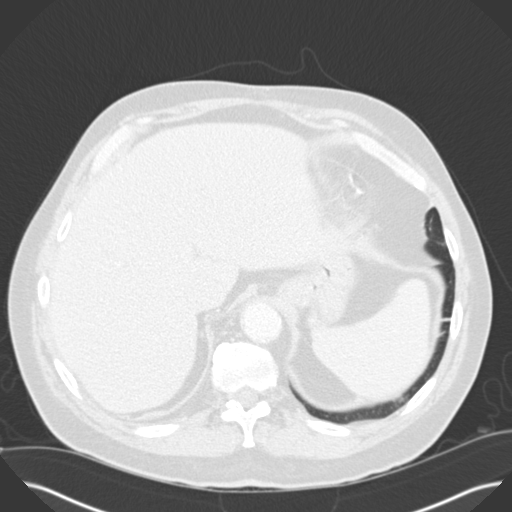
[im 146/167  soft-tissue]
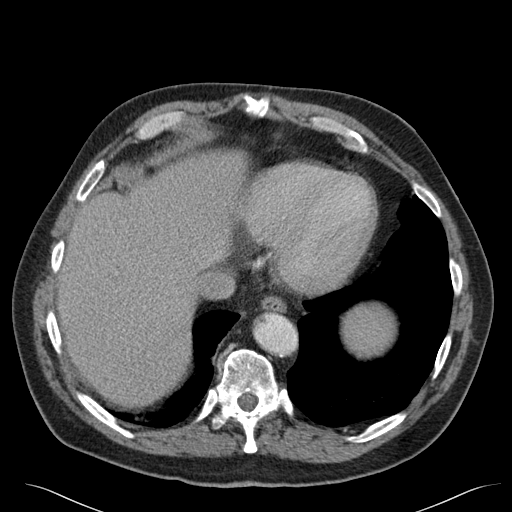
[im 146/167  lung]
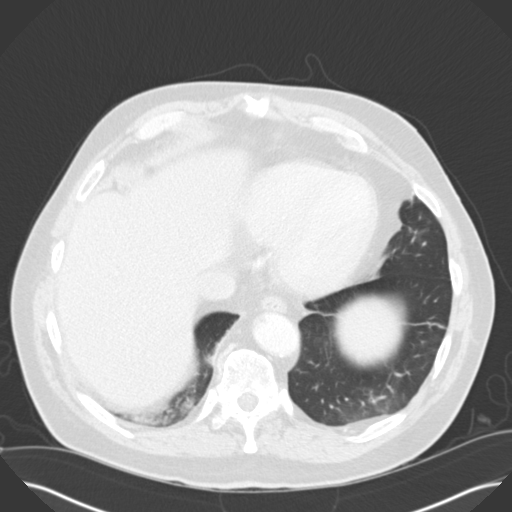
[im 153/167  lung]
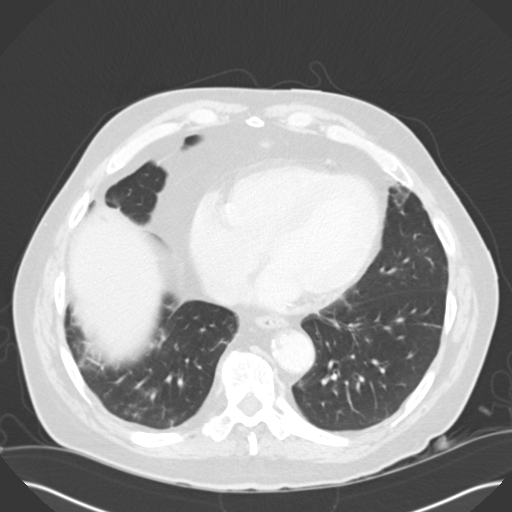
[im 160/167  soft-tissue]
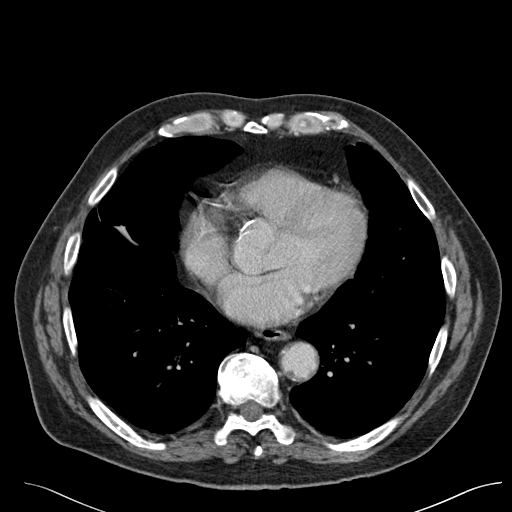
[im 160/167  lung]
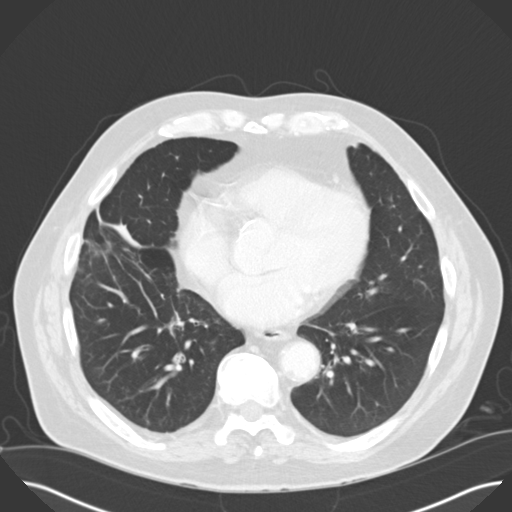

[15 of 32 positions shown; findings below may reference images not displayed]

FINDINGS: Bandlike opacity in the right middle lobe likely secondary to atelectasis.
Mild basilar opacities likely secondary to atelectasis, as seen on the
recent prior chest CT. The right hilar mass is not included and the scan.
Calcifications are seen in the coronary arteries.

The liver, spleen, adrenals, and pancreas are unremarkable. Surgical clips
are seen from prior cholecystectomy. A G-tube is present within the stomach.
The kidneys are unremarkable. The common iliac arteries are mildly enlarged.
A bowel suture line is seen in the inferior sigmoid colon. There is mild
diverticulosis of the sigmoid and descending colon. The small and large
bowel are normal in caliber. The appendix is not identified. However, there
are no inflammatory changes at the base of the cecum. There is a small
fat-containing periumbilical hernia.

No aggressive lytic or sclerotic osseous lesions are identified.
IMPRESSION: 1. No acute findings in the abdomen or pelvis.
2. Mild diverticulosis of the sigmoid and descending colon.

## 2013-09-30 DIAGNOSIS — E119 Type 2 diabetes mellitus without complications: Secondary | ICD-10-CM | POA: Diagnosis not present

## 2013-09-30 DIAGNOSIS — E785 Hyperlipidemia, unspecified: Secondary | ICD-10-CM | POA: Diagnosis not present

## 2013-09-30 DIAGNOSIS — I10 Essential (primary) hypertension: Secondary | ICD-10-CM | POA: Diagnosis not present

## 2013-09-30 DIAGNOSIS — Z23 Encounter for immunization: Secondary | ICD-10-CM | POA: Diagnosis not present

## 2013-12-17 DIAGNOSIS — F17200 Nicotine dependence, unspecified, uncomplicated: Secondary | ICD-10-CM | POA: Diagnosis not present

## 2013-12-17 DIAGNOSIS — R059 Cough, unspecified: Secondary | ICD-10-CM | POA: Diagnosis not present

## 2013-12-17 DIAGNOSIS — J449 Chronic obstructive pulmonary disease, unspecified: Secondary | ICD-10-CM | POA: Diagnosis not present

## 2013-12-17 DIAGNOSIS — J984 Other disorders of lung: Secondary | ICD-10-CM | POA: Diagnosis not present

## 2013-12-17 DIAGNOSIS — R05 Cough: Secondary | ICD-10-CM | POA: Diagnosis not present

## 2013-12-30 ENCOUNTER — Inpatient Hospital Stay: Payer: Self-pay | Admitting: Student

## 2013-12-30 DIAGNOSIS — E871 Hypo-osmolality and hyponatremia: Secondary | ICD-10-CM | POA: Diagnosis not present

## 2013-12-30 DIAGNOSIS — N179 Acute kidney failure, unspecified: Secondary | ICD-10-CM | POA: Diagnosis not present

## 2013-12-30 DIAGNOSIS — K7460 Unspecified cirrhosis of liver: Secondary | ICD-10-CM | POA: Diagnosis not present

## 2013-12-30 DIAGNOSIS — N390 Urinary tract infection, site not specified: Secondary | ICD-10-CM | POA: Diagnosis not present

## 2013-12-30 DIAGNOSIS — R7309 Other abnormal glucose: Secondary | ICD-10-CM | POA: Diagnosis not present

## 2013-12-30 LAB — CBC
HCT: 44 % (ref 40.0–52.0)
HGB: 14.4 g/dL (ref 13.0–18.0)
MCH: 29.6 pg (ref 26.0–34.0)
MCHC: 32.8 g/dL (ref 32.0–36.0)
MCV: 90 fL (ref 80–100)
Platelet: 119 10*3/uL — ABNORMAL LOW (ref 150–440)
RBC: 4.89 10*6/uL (ref 4.40–5.90)
RDW: 15.5 % — AB (ref 11.5–14.5)
WBC: 10.6 10*3/uL (ref 3.8–10.6)

## 2013-12-30 LAB — COMPREHENSIVE METABOLIC PANEL
ALBUMIN: 3.6 g/dL (ref 3.4–5.0)
ALK PHOS: 118 U/L — AB
ANION GAP: 10 (ref 7–16)
AST: 49 U/L — AB (ref 15–37)
BUN: 52 mg/dL — ABNORMAL HIGH (ref 7–18)
Bilirubin,Total: 0.6 mg/dL (ref 0.2–1.0)
CALCIUM: 9 mg/dL (ref 8.5–10.1)
CREATININE: 2.89 mg/dL — AB (ref 0.60–1.30)
Chloride: 95 mmol/L — ABNORMAL LOW (ref 98–107)
Co2: 22 mmol/L (ref 21–32)
EGFR (Non-African Amer.): 22 — ABNORMAL LOW
GFR CALC AF AMER: 26 — AB
Glucose: 512 mg/dL (ref 65–99)
OSMOLALITY: 292 (ref 275–301)
POTASSIUM: 4.4 mmol/L (ref 3.5–5.1)
SGPT (ALT): 49 U/L (ref 12–78)
Sodium: 127 mmol/L — ABNORMAL LOW (ref 136–145)
Total Protein: 7.8 g/dL (ref 6.4–8.2)

## 2013-12-30 LAB — URINALYSIS, COMPLETE
BACTERIA: NONE SEEN
Bilirubin,UR: NEGATIVE
Glucose,UR: >=500 mg/dL (ref 0–75)
Ketone: NEGATIVE
NITRITE: NEGATIVE
PH: 5 (ref 4.5–8.0)
Protein: >=500
RBC,UR: 630 /HPF (ref 0–5)
Specific Gravity: 1.022 (ref 1.003–1.030)
Squamous Epithelial: NONE SEEN
WBC UR: 4860 /HPF (ref 0–5)

## 2013-12-31 DIAGNOSIS — N179 Acute kidney failure, unspecified: Secondary | ICD-10-CM | POA: Diagnosis not present

## 2013-12-31 DIAGNOSIS — I10 Essential (primary) hypertension: Secondary | ICD-10-CM | POA: Diagnosis not present

## 2013-12-31 DIAGNOSIS — R7309 Other abnormal glucose: Secondary | ICD-10-CM | POA: Diagnosis not present

## 2013-12-31 DIAGNOSIS — E871 Hypo-osmolality and hyponatremia: Secondary | ICD-10-CM | POA: Diagnosis not present

## 2013-12-31 DIAGNOSIS — N390 Urinary tract infection, site not specified: Secondary | ICD-10-CM | POA: Diagnosis not present

## 2013-12-31 LAB — CBC WITH DIFFERENTIAL/PLATELET
Basophil #: 0.1 10*3/uL (ref 0.0–0.1)
Basophil %: 1 %
EOS ABS: 0.3 10*3/uL (ref 0.0–0.7)
EOS PCT: 2.8 %
HCT: 38.6 % — ABNORMAL LOW (ref 40.0–52.0)
HGB: 12.8 g/dL — AB (ref 13.0–18.0)
Lymphocyte #: 1.2 10*3/uL (ref 1.0–3.6)
Lymphocyte %: 12.1 %
MCH: 29.5 pg (ref 26.0–34.0)
MCHC: 33.2 g/dL (ref 32.0–36.0)
MCV: 89 fL (ref 80–100)
MONO ABS: 0.9 x10 3/mm (ref 0.2–1.0)
Monocyte %: 8.6 %
Neutrophil #: 7.6 10*3/uL — ABNORMAL HIGH (ref 1.4–6.5)
Neutrophil %: 75.5 %
PLATELETS: 78 10*3/uL — AB (ref 150–440)
RBC: 4.35 10*6/uL — AB (ref 4.40–5.90)
RDW: 15.3 % — ABNORMAL HIGH (ref 11.5–14.5)
WBC: 10.1 10*3/uL (ref 3.8–10.6)

## 2013-12-31 LAB — BASIC METABOLIC PANEL
Anion Gap: 6 — ABNORMAL LOW (ref 7–16)
BUN: 48 mg/dL — ABNORMAL HIGH (ref 7–18)
CALCIUM: 8.6 mg/dL (ref 8.5–10.1)
CHLORIDE: 101 mmol/L (ref 98–107)
CO2: 25 mmol/L (ref 21–32)
CREATININE: 1.87 mg/dL — AB (ref 0.60–1.30)
EGFR (African American): 44 — ABNORMAL LOW
GFR CALC NON AF AMER: 38 — AB
Glucose: 271 mg/dL — ABNORMAL HIGH (ref 65–99)
Osmolality: 287 (ref 275–301)
POTASSIUM: 3.6 mmol/L (ref 3.5–5.1)
Sodium: 132 mmol/L — ABNORMAL LOW (ref 136–145)

## 2013-12-31 LAB — PROTEIN / CREATININE RATIO, URINE
Creatinine, Urine: 97.5 mg/dL (ref 30.0–125.0)
Protein, Random Urine: 69 mg/dL — ABNORMAL HIGH (ref 0–12)
Protein/Creat. Ratio: 708 mg/gCREAT — ABNORMAL HIGH (ref 0–200)

## 2013-12-31 LAB — HEMOGLOBIN A1C: Hemoglobin A1C: 13.3 % — ABNORMAL HIGH (ref 4.2–6.3)

## 2013-12-31 LAB — MAGNESIUM: Magnesium: 1.8 mg/dL

## 2013-12-31 LAB — SODIUM, URINE, RANDOM: SODIUM, URINE RANDOM: 39 mmol/L (ref 20–110)

## 2014-01-01 DIAGNOSIS — J9600 Acute respiratory failure, unspecified whether with hypoxia or hypercapnia: Secondary | ICD-10-CM | POA: Diagnosis not present

## 2014-01-01 DIAGNOSIS — E871 Hypo-osmolality and hyponatremia: Secondary | ICD-10-CM | POA: Diagnosis not present

## 2014-01-01 DIAGNOSIS — I10 Essential (primary) hypertension: Secondary | ICD-10-CM | POA: Diagnosis not present

## 2014-01-01 DIAGNOSIS — N179 Acute kidney failure, unspecified: Secondary | ICD-10-CM | POA: Diagnosis not present

## 2014-01-01 LAB — CBC WITH DIFFERENTIAL/PLATELET
Basophil #: 0.1 10*3/uL (ref 0.0–0.1)
Basophil %: 1.5 %
EOS ABS: 0.3 10*3/uL (ref 0.0–0.7)
Eosinophil %: 4.8 %
HCT: 35.8 % — ABNORMAL LOW (ref 40.0–52.0)
HGB: 12 g/dL — ABNORMAL LOW (ref 13.0–18.0)
LYMPHS PCT: 11.3 %
Lymphocyte #: 0.8 10*3/uL — ABNORMAL LOW (ref 1.0–3.6)
MCH: 29.8 pg (ref 26.0–34.0)
MCHC: 33.6 g/dL (ref 32.0–36.0)
MCV: 89 fL (ref 80–100)
MONOS PCT: 7.6 %
Monocyte #: 0.5 x10 3/mm (ref 0.2–1.0)
Neutrophil #: 5.4 10*3/uL (ref 1.4–6.5)
Neutrophil %: 74.8 %
PLATELETS: 80 10*3/uL — AB (ref 150–440)
RBC: 4.04 10*6/uL — ABNORMAL LOW (ref 4.40–5.90)
RDW: 15.3 % — ABNORMAL HIGH (ref 11.5–14.5)
WBC: 7.2 10*3/uL (ref 3.8–10.6)

## 2014-01-01 LAB — BASIC METABOLIC PANEL
Anion Gap: 5 — ABNORMAL LOW (ref 7–16)
BUN: 23 mg/dL — ABNORMAL HIGH (ref 7–18)
CALCIUM: 8.1 mg/dL — AB (ref 8.5–10.1)
CHLORIDE: 108 mmol/L — AB (ref 98–107)
Co2: 25 mmol/L (ref 21–32)
Creatinine: 1.17 mg/dL (ref 0.60–1.30)
EGFR (African American): >60
EGFR (Non-African Amer.): >60
Glucose: 196 mg/dL — ABNORMAL HIGH (ref 65–99)
OSMOLALITY: 285 (ref 275–301)
Potassium: 3.6 mmol/L (ref 3.5–5.1)
Sodium: 138 mmol/L (ref 136–145)

## 2014-01-02 LAB — BASIC METABOLIC PANEL
Anion Gap: 8 (ref 7–16)
BUN: 15 mg/dL (ref 7–18)
CHLORIDE: 104 mmol/L (ref 98–107)
Calcium, Total: 8.5 mg/dL (ref 8.5–10.1)
Co2: 22 mmol/L (ref 21–32)
Creatinine: 0.94 mg/dL (ref 0.60–1.30)
EGFR (Non-African Amer.): >60
GLUCOSE: 187 mg/dL — AB (ref 65–99)
Osmolality: 274 (ref 275–301)
Potassium: 3.3 mmol/L — ABNORMAL LOW (ref 3.5–5.1)
Sodium: 134 mmol/L — ABNORMAL LOW (ref 136–145)

## 2014-01-02 LAB — MAGNESIUM: Magnesium: 1.5 mg/dL — ABNORMAL LOW

## 2014-01-02 LAB — HEMOGLOBIN: HGB: 12.1 g/dL — ABNORMAL LOW (ref 13.0–18.0)

## 2014-01-02 LAB — PLATELET COUNT: PLATELETS: 86 10*3/uL — AB (ref 150–440)

## 2014-01-03 DIAGNOSIS — N179 Acute kidney failure, unspecified: Secondary | ICD-10-CM | POA: Diagnosis not present

## 2014-01-03 DIAGNOSIS — E119 Type 2 diabetes mellitus without complications: Secondary | ICD-10-CM | POA: Diagnosis not present

## 2014-01-03 DIAGNOSIS — N390 Urinary tract infection, site not specified: Secondary | ICD-10-CM | POA: Diagnosis not present

## 2014-01-03 DIAGNOSIS — E871 Hypo-osmolality and hyponatremia: Secondary | ICD-10-CM | POA: Diagnosis not present

## 2014-01-03 LAB — URINALYSIS, COMPLETE
BACTERIA: NONE SEEN
Bilirubin,UR: NEGATIVE
Glucose,UR: NEGATIVE mg/dL (ref 0–75)
Ketone: NEGATIVE
Nitrite: NEGATIVE
PH: 6 (ref 4.5–8.0)
Protein: 30
RBC,UR: 87 /HPF (ref 0–5)
Specific Gravity: 1.016 (ref 1.003–1.030)
Squamous Epithelial: <1

## 2014-01-03 LAB — PROTEIN ELECTROPHORESIS(ARMC)

## 2014-01-03 LAB — BASIC METABOLIC PANEL
Anion Gap: 7 (ref 7–16)
BUN: 9 mg/dL (ref 7–18)
CALCIUM: 8.6 mg/dL (ref 8.5–10.1)
CO2: 22 mmol/L (ref 21–32)
Chloride: 110 mmol/L — ABNORMAL HIGH (ref 98–107)
Creatinine: 0.85 mg/dL (ref 0.60–1.30)
Glucose: 56 mg/dL — ABNORMAL LOW (ref 65–99)
Osmolality: 274 (ref 275–301)
Potassium: 3.6 mmol/L (ref 3.5–5.1)
Sodium: 139 mmol/L (ref 136–145)

## 2014-01-03 LAB — UR PROT ELECTROPHORESIS, URINE RANDOM

## 2014-01-03 LAB — URINE CULTURE

## 2014-01-04 LAB — HEMOGLOBIN: HGB: 12.3 g/dL — ABNORMAL LOW (ref 13.0–18.0)

## 2014-01-05 DIAGNOSIS — M25569 Pain in unspecified knee: Secondary | ICD-10-CM | POA: Diagnosis not present

## 2014-01-05 DIAGNOSIS — M25559 Pain in unspecified hip: Secondary | ICD-10-CM | POA: Diagnosis not present

## 2014-01-05 DIAGNOSIS — B1920 Unspecified viral hepatitis C without hepatic coma: Secondary | ICD-10-CM | POA: Diagnosis not present

## 2014-01-05 DIAGNOSIS — IMO0001 Reserved for inherently not codable concepts without codable children: Secondary | ICD-10-CM | POA: Diagnosis not present

## 2014-01-05 DIAGNOSIS — J449 Chronic obstructive pulmonary disease, unspecified: Secondary | ICD-10-CM | POA: Diagnosis not present

## 2014-01-05 DIAGNOSIS — E119 Type 2 diabetes mellitus without complications: Secondary | ICD-10-CM | POA: Diagnosis not present

## 2014-01-05 DIAGNOSIS — Z794 Long term (current) use of insulin: Secondary | ICD-10-CM | POA: Diagnosis not present

## 2014-01-07 ENCOUNTER — Ambulatory Visit: Payer: Self-pay | Admitting: Family Medicine

## 2014-01-07 DIAGNOSIS — IMO0001 Reserved for inherently not codable concepts without codable children: Secondary | ICD-10-CM | POA: Diagnosis not present

## 2014-01-07 DIAGNOSIS — K7460 Unspecified cirrhosis of liver: Secondary | ICD-10-CM | POA: Diagnosis not present

## 2014-01-07 DIAGNOSIS — M25519 Pain in unspecified shoulder: Secondary | ICD-10-CM | POA: Diagnosis not present

## 2014-01-07 DIAGNOSIS — B1920 Unspecified viral hepatitis C without hepatic coma: Secondary | ICD-10-CM | POA: Diagnosis not present

## 2014-01-07 DIAGNOSIS — M19019 Primary osteoarthritis, unspecified shoulder: Secondary | ICD-10-CM | POA: Diagnosis not present

## 2014-01-07 DIAGNOSIS — D696 Thrombocytopenia, unspecified: Secondary | ICD-10-CM | POA: Diagnosis not present

## 2014-01-14 DIAGNOSIS — M25569 Pain in unspecified knee: Secondary | ICD-10-CM | POA: Diagnosis not present

## 2014-01-14 DIAGNOSIS — IMO0001 Reserved for inherently not codable concepts without codable children: Secondary | ICD-10-CM | POA: Diagnosis not present

## 2014-01-14 DIAGNOSIS — M25559 Pain in unspecified hip: Secondary | ICD-10-CM | POA: Diagnosis not present

## 2014-01-14 DIAGNOSIS — B1920 Unspecified viral hepatitis C without hepatic coma: Secondary | ICD-10-CM | POA: Diagnosis not present

## 2014-01-14 DIAGNOSIS — E119 Type 2 diabetes mellitus without complications: Secondary | ICD-10-CM | POA: Diagnosis not present

## 2014-01-14 DIAGNOSIS — J449 Chronic obstructive pulmonary disease, unspecified: Secondary | ICD-10-CM | POA: Diagnosis not present

## 2014-01-15 DIAGNOSIS — M25559 Pain in unspecified hip: Secondary | ICD-10-CM | POA: Diagnosis not present

## 2014-01-15 DIAGNOSIS — E119 Type 2 diabetes mellitus without complications: Secondary | ICD-10-CM | POA: Diagnosis not present

## 2014-01-15 DIAGNOSIS — J449 Chronic obstructive pulmonary disease, unspecified: Secondary | ICD-10-CM | POA: Diagnosis not present

## 2014-01-15 DIAGNOSIS — IMO0001 Reserved for inherently not codable concepts without codable children: Secondary | ICD-10-CM | POA: Diagnosis not present

## 2014-01-15 DIAGNOSIS — B1920 Unspecified viral hepatitis C without hepatic coma: Secondary | ICD-10-CM | POA: Diagnosis not present

## 2014-01-15 DIAGNOSIS — M25569 Pain in unspecified knee: Secondary | ICD-10-CM | POA: Diagnosis not present

## 2014-01-17 DIAGNOSIS — E119 Type 2 diabetes mellitus without complications: Secondary | ICD-10-CM | POA: Diagnosis not present

## 2014-01-17 DIAGNOSIS — J449 Chronic obstructive pulmonary disease, unspecified: Secondary | ICD-10-CM | POA: Diagnosis not present

## 2014-01-17 DIAGNOSIS — M25569 Pain in unspecified knee: Secondary | ICD-10-CM | POA: Diagnosis not present

## 2014-01-17 DIAGNOSIS — B1920 Unspecified viral hepatitis C without hepatic coma: Secondary | ICD-10-CM | POA: Diagnosis not present

## 2014-01-17 DIAGNOSIS — M25559 Pain in unspecified hip: Secondary | ICD-10-CM | POA: Diagnosis not present

## 2014-01-17 DIAGNOSIS — IMO0001 Reserved for inherently not codable concepts without codable children: Secondary | ICD-10-CM | POA: Diagnosis not present

## 2014-01-19 DIAGNOSIS — M25569 Pain in unspecified knee: Secondary | ICD-10-CM | POA: Diagnosis not present

## 2014-01-19 DIAGNOSIS — J449 Chronic obstructive pulmonary disease, unspecified: Secondary | ICD-10-CM | POA: Diagnosis not present

## 2014-01-19 DIAGNOSIS — B1920 Unspecified viral hepatitis C without hepatic coma: Secondary | ICD-10-CM | POA: Diagnosis not present

## 2014-01-19 DIAGNOSIS — IMO0001 Reserved for inherently not codable concepts without codable children: Secondary | ICD-10-CM | POA: Diagnosis not present

## 2014-01-19 DIAGNOSIS — M25559 Pain in unspecified hip: Secondary | ICD-10-CM | POA: Diagnosis not present

## 2014-01-19 DIAGNOSIS — E119 Type 2 diabetes mellitus without complications: Secondary | ICD-10-CM | POA: Diagnosis not present

## 2014-01-20 DIAGNOSIS — E119 Type 2 diabetes mellitus without complications: Secondary | ICD-10-CM | POA: Diagnosis not present

## 2014-01-20 DIAGNOSIS — B1920 Unspecified viral hepatitis C without hepatic coma: Secondary | ICD-10-CM | POA: Diagnosis not present

## 2014-01-21 DIAGNOSIS — B1920 Unspecified viral hepatitis C without hepatic coma: Secondary | ICD-10-CM | POA: Diagnosis not present

## 2014-01-21 DIAGNOSIS — E119 Type 2 diabetes mellitus without complications: Secondary | ICD-10-CM | POA: Diagnosis not present

## 2014-01-21 DIAGNOSIS — IMO0001 Reserved for inherently not codable concepts without codable children: Secondary | ICD-10-CM | POA: Diagnosis not present

## 2014-01-21 DIAGNOSIS — M25569 Pain in unspecified knee: Secondary | ICD-10-CM | POA: Diagnosis not present

## 2014-01-21 DIAGNOSIS — M25559 Pain in unspecified hip: Secondary | ICD-10-CM | POA: Diagnosis not present

## 2014-01-21 DIAGNOSIS — J449 Chronic obstructive pulmonary disease, unspecified: Secondary | ICD-10-CM | POA: Diagnosis not present

## 2014-01-24 DIAGNOSIS — IMO0001 Reserved for inherently not codable concepts without codable children: Secondary | ICD-10-CM | POA: Diagnosis not present

## 2014-01-24 DIAGNOSIS — E119 Type 2 diabetes mellitus without complications: Secondary | ICD-10-CM | POA: Diagnosis not present

## 2014-01-24 DIAGNOSIS — B1920 Unspecified viral hepatitis C without hepatic coma: Secondary | ICD-10-CM | POA: Diagnosis not present

## 2014-01-24 DIAGNOSIS — J449 Chronic obstructive pulmonary disease, unspecified: Secondary | ICD-10-CM | POA: Diagnosis not present

## 2014-01-24 DIAGNOSIS — M25559 Pain in unspecified hip: Secondary | ICD-10-CM | POA: Diagnosis not present

## 2014-01-24 DIAGNOSIS — M25569 Pain in unspecified knee: Secondary | ICD-10-CM | POA: Diagnosis not present

## 2014-01-27 ENCOUNTER — Ambulatory Visit: Payer: Self-pay | Admitting: Family Medicine

## 2014-01-27 DIAGNOSIS — R1011 Right upper quadrant pain: Secondary | ICD-10-CM | POA: Diagnosis not present

## 2014-01-27 DIAGNOSIS — B1920 Unspecified viral hepatitis C without hepatic coma: Secondary | ICD-10-CM | POA: Diagnosis not present

## 2014-01-28 DIAGNOSIS — B1920 Unspecified viral hepatitis C without hepatic coma: Secondary | ICD-10-CM | POA: Diagnosis not present

## 2014-01-28 DIAGNOSIS — J449 Chronic obstructive pulmonary disease, unspecified: Secondary | ICD-10-CM | POA: Diagnosis not present

## 2014-01-28 DIAGNOSIS — IMO0001 Reserved for inherently not codable concepts without codable children: Secondary | ICD-10-CM | POA: Diagnosis not present

## 2014-01-28 DIAGNOSIS — M25569 Pain in unspecified knee: Secondary | ICD-10-CM | POA: Diagnosis not present

## 2014-01-28 DIAGNOSIS — M25559 Pain in unspecified hip: Secondary | ICD-10-CM | POA: Diagnosis not present

## 2014-01-28 DIAGNOSIS — E119 Type 2 diabetes mellitus without complications: Secondary | ICD-10-CM | POA: Diagnosis not present

## 2014-02-15 DIAGNOSIS — E119 Type 2 diabetes mellitus without complications: Secondary | ICD-10-CM | POA: Diagnosis not present

## 2014-02-15 DIAGNOSIS — B1920 Unspecified viral hepatitis C without hepatic coma: Secondary | ICD-10-CM | POA: Diagnosis not present

## 2014-02-15 DIAGNOSIS — IMO0001 Reserved for inherently not codable concepts without codable children: Secondary | ICD-10-CM | POA: Diagnosis not present

## 2014-02-15 DIAGNOSIS — M25569 Pain in unspecified knee: Secondary | ICD-10-CM | POA: Diagnosis not present

## 2014-02-15 DIAGNOSIS — M25559 Pain in unspecified hip: Secondary | ICD-10-CM | POA: Diagnosis not present

## 2014-02-15 DIAGNOSIS — J449 Chronic obstructive pulmonary disease, unspecified: Secondary | ICD-10-CM | POA: Diagnosis not present

## 2014-02-24 DIAGNOSIS — K7460 Unspecified cirrhosis of liver: Secondary | ICD-10-CM | POA: Diagnosis not present

## 2014-02-24 DIAGNOSIS — IMO0001 Reserved for inherently not codable concepts without codable children: Secondary | ICD-10-CM | POA: Diagnosis not present

## 2014-02-24 DIAGNOSIS — B1920 Unspecified viral hepatitis C without hepatic coma: Secondary | ICD-10-CM | POA: Diagnosis not present

## 2014-02-24 DIAGNOSIS — R079 Chest pain, unspecified: Secondary | ICD-10-CM | POA: Diagnosis not present

## 2014-03-02 DIAGNOSIS — R3989 Other symptoms and signs involving the genitourinary system: Secondary | ICD-10-CM | POA: Diagnosis not present

## 2014-03-02 DIAGNOSIS — K7460 Unspecified cirrhosis of liver: Secondary | ICD-10-CM | POA: Diagnosis not present

## 2014-03-02 DIAGNOSIS — D696 Thrombocytopenia, unspecified: Secondary | ICD-10-CM | POA: Diagnosis not present

## 2014-03-03 DIAGNOSIS — IMO0001 Reserved for inherently not codable concepts without codable children: Secondary | ICD-10-CM | POA: Diagnosis not present

## 2014-03-03 DIAGNOSIS — E119 Type 2 diabetes mellitus without complications: Secondary | ICD-10-CM | POA: Diagnosis not present

## 2014-03-03 DIAGNOSIS — M25559 Pain in unspecified hip: Secondary | ICD-10-CM | POA: Diagnosis not present

## 2014-03-03 DIAGNOSIS — J449 Chronic obstructive pulmonary disease, unspecified: Secondary | ICD-10-CM | POA: Diagnosis not present

## 2014-03-03 DIAGNOSIS — M25569 Pain in unspecified knee: Secondary | ICD-10-CM | POA: Diagnosis not present

## 2014-03-03 DIAGNOSIS — B1920 Unspecified viral hepatitis C without hepatic coma: Secondary | ICD-10-CM | POA: Diagnosis not present

## 2014-03-06 DIAGNOSIS — E119 Type 2 diabetes mellitus without complications: Secondary | ICD-10-CM | POA: Diagnosis not present

## 2014-03-06 DIAGNOSIS — J449 Chronic obstructive pulmonary disease, unspecified: Secondary | ICD-10-CM | POA: Diagnosis not present

## 2014-03-06 DIAGNOSIS — M25569 Pain in unspecified knee: Secondary | ICD-10-CM | POA: Diagnosis not present

## 2014-03-06 DIAGNOSIS — M25559 Pain in unspecified hip: Secondary | ICD-10-CM | POA: Diagnosis not present

## 2014-03-06 DIAGNOSIS — B1920 Unspecified viral hepatitis C without hepatic coma: Secondary | ICD-10-CM | POA: Diagnosis not present

## 2014-03-06 DIAGNOSIS — Z794 Long term (current) use of insulin: Secondary | ICD-10-CM | POA: Diagnosis not present

## 2014-03-11 DIAGNOSIS — E119 Type 2 diabetes mellitus without complications: Secondary | ICD-10-CM | POA: Diagnosis not present

## 2014-03-11 DIAGNOSIS — J449 Chronic obstructive pulmonary disease, unspecified: Secondary | ICD-10-CM | POA: Diagnosis not present

## 2014-03-11 DIAGNOSIS — B1920 Unspecified viral hepatitis C without hepatic coma: Secondary | ICD-10-CM | POA: Diagnosis not present

## 2014-03-25 DIAGNOSIS — E119 Type 2 diabetes mellitus without complications: Secondary | ICD-10-CM | POA: Diagnosis not present

## 2014-03-25 DIAGNOSIS — B1920 Unspecified viral hepatitis C without hepatic coma: Secondary | ICD-10-CM | POA: Diagnosis not present

## 2014-03-25 DIAGNOSIS — M25569 Pain in unspecified knee: Secondary | ICD-10-CM | POA: Diagnosis not present

## 2014-03-25 DIAGNOSIS — M25559 Pain in unspecified hip: Secondary | ICD-10-CM | POA: Diagnosis not present

## 2014-03-25 DIAGNOSIS — Z794 Long term (current) use of insulin: Secondary | ICD-10-CM | POA: Diagnosis not present

## 2014-03-25 DIAGNOSIS — J449 Chronic obstructive pulmonary disease, unspecified: Secondary | ICD-10-CM | POA: Diagnosis not present

## 2014-03-30 DIAGNOSIS — B182 Chronic viral hepatitis C: Secondary | ICD-10-CM | POA: Diagnosis not present

## 2014-04-08 DIAGNOSIS — B353 Tinea pedis: Secondary | ICD-10-CM | POA: Diagnosis not present

## 2014-04-08 DIAGNOSIS — IMO0001 Reserved for inherently not codable concepts without codable children: Secondary | ICD-10-CM | POA: Diagnosis not present

## 2014-04-21 DIAGNOSIS — J449 Chronic obstructive pulmonary disease, unspecified: Secondary | ICD-10-CM | POA: Diagnosis not present

## 2014-04-21 DIAGNOSIS — E119 Type 2 diabetes mellitus without complications: Secondary | ICD-10-CM | POA: Diagnosis not present

## 2014-04-21 DIAGNOSIS — B1920 Unspecified viral hepatitis C without hepatic coma: Secondary | ICD-10-CM | POA: Diagnosis not present

## 2014-04-21 DIAGNOSIS — M25569 Pain in unspecified knee: Secondary | ICD-10-CM | POA: Diagnosis not present

## 2014-04-21 DIAGNOSIS — M25559 Pain in unspecified hip: Secondary | ICD-10-CM | POA: Diagnosis not present

## 2014-04-21 DIAGNOSIS — Z794 Long term (current) use of insulin: Secondary | ICD-10-CM | POA: Diagnosis not present

## 2014-04-27 DIAGNOSIS — E119 Type 2 diabetes mellitus without complications: Secondary | ICD-10-CM | POA: Diagnosis not present

## 2014-04-27 DIAGNOSIS — B351 Tinea unguium: Secondary | ICD-10-CM | POA: Diagnosis not present

## 2014-06-06 DIAGNOSIS — E7800 Pure hypercholesterolemia, unspecified: Secondary | ICD-10-CM | POA: Diagnosis not present

## 2014-06-06 DIAGNOSIS — IMO0001 Reserved for inherently not codable concepts without codable children: Secondary | ICD-10-CM | POA: Diagnosis not present

## 2014-06-06 DIAGNOSIS — G4700 Insomnia, unspecified: Secondary | ICD-10-CM | POA: Diagnosis not present

## 2014-06-20 DIAGNOSIS — J984 Other disorders of lung: Secondary | ICD-10-CM | POA: Diagnosis not present

## 2014-06-20 DIAGNOSIS — R05 Cough: Secondary | ICD-10-CM | POA: Diagnosis not present

## 2014-06-20 DIAGNOSIS — J449 Chronic obstructive pulmonary disease, unspecified: Secondary | ICD-10-CM | POA: Diagnosis not present

## 2014-06-20 DIAGNOSIS — J3800 Paralysis of vocal cords and larynx, unspecified: Secondary | ICD-10-CM | POA: Diagnosis not present

## 2014-06-20 DIAGNOSIS — R918 Other nonspecific abnormal finding of lung field: Secondary | ICD-10-CM | POA: Diagnosis not present

## 2014-06-20 DIAGNOSIS — R059 Cough, unspecified: Secondary | ICD-10-CM | POA: Diagnosis not present

## 2014-06-22 DIAGNOSIS — H2510 Age-related nuclear cataract, unspecified eye: Secondary | ICD-10-CM | POA: Diagnosis not present

## 2014-06-24 DIAGNOSIS — J449 Chronic obstructive pulmonary disease, unspecified: Secondary | ICD-10-CM | POA: Diagnosis not present

## 2014-09-05 DIAGNOSIS — B351 Tinea unguium: Secondary | ICD-10-CM | POA: Diagnosis not present

## 2014-09-05 DIAGNOSIS — E119 Type 2 diabetes mellitus without complications: Secondary | ICD-10-CM | POA: Diagnosis not present

## 2014-09-08 ENCOUNTER — Ambulatory Visit: Payer: Self-pay | Admitting: Family Medicine

## 2014-09-08 DIAGNOSIS — J438 Other emphysema: Secondary | ICD-10-CM | POA: Diagnosis not present

## 2014-09-08 DIAGNOSIS — Z23 Encounter for immunization: Secondary | ICD-10-CM | POA: Diagnosis not present

## 2014-09-08 DIAGNOSIS — M479 Spondylosis, unspecified: Secondary | ICD-10-CM | POA: Diagnosis not present

## 2014-09-08 DIAGNOSIS — J9811 Atelectasis: Secondary | ICD-10-CM | POA: Diagnosis not present

## 2014-09-08 DIAGNOSIS — I440 Atrioventricular block, first degree: Secondary | ICD-10-CM | POA: Diagnosis not present

## 2014-09-08 DIAGNOSIS — R079 Chest pain, unspecified: Secondary | ICD-10-CM | POA: Diagnosis not present

## 2014-09-08 DIAGNOSIS — R042 Hemoptysis: Secondary | ICD-10-CM | POA: Diagnosis not present

## 2014-09-08 DIAGNOSIS — J449 Chronic obstructive pulmonary disease, unspecified: Secondary | ICD-10-CM | POA: Diagnosis not present

## 2014-09-08 DIAGNOSIS — J439 Emphysema, unspecified: Secondary | ICD-10-CM | POA: Diagnosis not present

## 2014-09-12 DIAGNOSIS — I10 Essential (primary) hypertension: Secondary | ICD-10-CM | POA: Diagnosis not present

## 2014-09-12 DIAGNOSIS — E119 Type 2 diabetes mellitus without complications: Secondary | ICD-10-CM | POA: Insufficient documentation

## 2014-09-12 DIAGNOSIS — Z720 Tobacco use: Secondary | ICD-10-CM | POA: Diagnosis not present

## 2014-09-12 DIAGNOSIS — J449 Chronic obstructive pulmonary disease, unspecified: Secondary | ICD-10-CM | POA: Diagnosis not present

## 2014-09-12 DIAGNOSIS — R0789 Other chest pain: Secondary | ICD-10-CM | POA: Diagnosis not present

## 2014-09-22 DIAGNOSIS — G259 Extrapyramidal and movement disorder, unspecified: Secondary | ICD-10-CM | POA: Diagnosis not present

## 2014-09-22 DIAGNOSIS — B1920 Unspecified viral hepatitis C without hepatic coma: Secondary | ICD-10-CM | POA: Diagnosis not present

## 2014-09-22 DIAGNOSIS — I10 Essential (primary) hypertension: Secondary | ICD-10-CM | POA: Diagnosis not present

## 2014-09-22 DIAGNOSIS — E1165 Type 2 diabetes mellitus with hyperglycemia: Secondary | ICD-10-CM | POA: Diagnosis not present

## 2014-09-22 DIAGNOSIS — Z125 Encounter for screening for malignant neoplasm of prostate: Secondary | ICD-10-CM | POA: Diagnosis not present

## 2014-09-30 DIAGNOSIS — R0789 Other chest pain: Secondary | ICD-10-CM | POA: Diagnosis not present

## 2014-10-03 DIAGNOSIS — R0602 Shortness of breath: Secondary | ICD-10-CM | POA: Diagnosis not present

## 2014-10-14 DIAGNOSIS — J449 Chronic obstructive pulmonary disease, unspecified: Secondary | ICD-10-CM | POA: Diagnosis not present

## 2014-10-14 DIAGNOSIS — Z720 Tobacco use: Secondary | ICD-10-CM | POA: Diagnosis not present

## 2014-10-14 DIAGNOSIS — E119 Type 2 diabetes mellitus without complications: Secondary | ICD-10-CM | POA: Diagnosis not present

## 2014-10-14 DIAGNOSIS — I10 Essential (primary) hypertension: Secondary | ICD-10-CM | POA: Diagnosis not present

## 2014-10-31 DIAGNOSIS — J449 Chronic obstructive pulmonary disease, unspecified: Secondary | ICD-10-CM | POA: Diagnosis not present

## 2014-10-31 DIAGNOSIS — J3801 Paralysis of vocal cords and larynx, unilateral: Secondary | ICD-10-CM | POA: Diagnosis not present

## 2015-02-16 DIAGNOSIS — E1165 Type 2 diabetes mellitus with hyperglycemia: Secondary | ICD-10-CM | POA: Diagnosis not present

## 2015-02-16 DIAGNOSIS — B1920 Unspecified viral hepatitis C without hepatic coma: Secondary | ICD-10-CM | POA: Diagnosis not present

## 2015-02-16 DIAGNOSIS — R809 Proteinuria, unspecified: Secondary | ICD-10-CM | POA: Diagnosis not present

## 2015-02-16 DIAGNOSIS — I10 Essential (primary) hypertension: Secondary | ICD-10-CM | POA: Diagnosis not present

## 2015-02-16 DIAGNOSIS — Z125 Encounter for screening for malignant neoplasm of prostate: Secondary | ICD-10-CM | POA: Diagnosis not present

## 2015-02-16 DIAGNOSIS — K7460 Unspecified cirrhosis of liver: Secondary | ICD-10-CM | POA: Diagnosis not present

## 2015-03-05 DIAGNOSIS — J441 Chronic obstructive pulmonary disease with (acute) exacerbation: Secondary | ICD-10-CM | POA: Diagnosis not present

## 2015-03-05 DIAGNOSIS — J439 Emphysema, unspecified: Secondary | ICD-10-CM | POA: Diagnosis not present

## 2015-03-05 DIAGNOSIS — R222 Localized swelling, mass and lump, trunk: Secondary | ICD-10-CM | POA: Diagnosis not present

## 2015-03-05 DIAGNOSIS — J690 Pneumonitis due to inhalation of food and vomit: Secondary | ICD-10-CM | POA: Diagnosis not present

## 2015-03-05 DIAGNOSIS — J9621 Acute and chronic respiratory failure with hypoxia: Secondary | ICD-10-CM | POA: Diagnosis not present

## 2015-03-05 DIAGNOSIS — S2232XA Fracture of one rib, left side, initial encounter for closed fracture: Secondary | ICD-10-CM | POA: Diagnosis not present

## 2015-03-05 DIAGNOSIS — R933 Abnormal findings on diagnostic imaging of other parts of digestive tract: Secondary | ICD-10-CM | POA: Diagnosis not present

## 2015-03-05 DIAGNOSIS — J698 Pneumonitis due to inhalation of other solids and liquids: Secondary | ICD-10-CM | POA: Diagnosis not present

## 2015-03-05 DIAGNOSIS — R0781 Pleurodynia: Secondary | ICD-10-CM | POA: Diagnosis not present

## 2015-03-05 DIAGNOSIS — J449 Chronic obstructive pulmonary disease, unspecified: Secondary | ICD-10-CM | POA: Diagnosis not present

## 2015-03-05 DIAGNOSIS — T189XXA Foreign body of alimentary tract, part unspecified, initial encounter: Secondary | ICD-10-CM | POA: Diagnosis not present

## 2015-03-05 DIAGNOSIS — K223 Perforation of esophagus: Secondary | ICD-10-CM | POA: Diagnosis not present

## 2015-03-05 DIAGNOSIS — R197 Diarrhea, unspecified: Secondary | ICD-10-CM | POA: Diagnosis not present

## 2015-03-05 DIAGNOSIS — S0990XA Unspecified injury of head, initial encounter: Secondary | ICD-10-CM | POA: Diagnosis not present

## 2015-03-05 DIAGNOSIS — R0902 Hypoxemia: Secondary | ICD-10-CM | POA: Diagnosis not present

## 2015-03-05 DIAGNOSIS — R0789 Other chest pain: Secondary | ICD-10-CM | POA: Diagnosis not present

## 2015-03-05 DIAGNOSIS — I10 Essential (primary) hypertension: Secondary | ICD-10-CM | POA: Diagnosis not present

## 2015-03-05 DIAGNOSIS — S20212A Contusion of left front wall of thorax, initial encounter: Secondary | ICD-10-CM | POA: Diagnosis not present

## 2015-03-06 DIAGNOSIS — R222 Localized swelling, mass and lump, trunk: Secondary | ICD-10-CM | POA: Diagnosis not present

## 2015-03-06 DIAGNOSIS — R933 Abnormal findings on diagnostic imaging of other parts of digestive tract: Secondary | ICD-10-CM | POA: Diagnosis not present

## 2015-03-06 DIAGNOSIS — J9621 Acute and chronic respiratory failure with hypoxia: Secondary | ICD-10-CM | POA: Diagnosis not present

## 2015-03-06 DIAGNOSIS — R197 Diarrhea, unspecified: Secondary | ICD-10-CM | POA: Diagnosis not present

## 2015-03-06 DIAGNOSIS — J9690 Respiratory failure, unspecified, unspecified whether with hypoxia or hypercapnia: Secondary | ICD-10-CM | POA: Diagnosis not present

## 2015-03-06 DIAGNOSIS — J449 Chronic obstructive pulmonary disease, unspecified: Secondary | ICD-10-CM | POA: Diagnosis not present

## 2015-03-06 DIAGNOSIS — R198 Other specified symptoms and signs involving the digestive system and abdomen: Secondary | ICD-10-CM | POA: Diagnosis not present

## 2015-03-06 DIAGNOSIS — J9811 Atelectasis: Secondary | ICD-10-CM | POA: Diagnosis not present

## 2015-03-07 DIAGNOSIS — J9621 Acute and chronic respiratory failure with hypoxia: Secondary | ICD-10-CM | POA: Diagnosis not present

## 2015-03-07 DIAGNOSIS — R1310 Dysphagia, unspecified: Secondary | ICD-10-CM | POA: Diagnosis not present

## 2015-03-07 DIAGNOSIS — J449 Chronic obstructive pulmonary disease, unspecified: Secondary | ICD-10-CM | POA: Diagnosis not present

## 2015-03-07 DIAGNOSIS — R222 Localized swelling, mass and lump, trunk: Secondary | ICD-10-CM | POA: Diagnosis not present

## 2015-03-08 DIAGNOSIS — J9811 Atelectasis: Secondary | ICD-10-CM | POA: Diagnosis not present

## 2015-03-09 DIAGNOSIS — J189 Pneumonia, unspecified organism: Secondary | ICD-10-CM | POA: Diagnosis not present

## 2015-03-09 DIAGNOSIS — I517 Cardiomegaly: Secondary | ICD-10-CM | POA: Diagnosis not present

## 2015-03-09 DIAGNOSIS — I340 Nonrheumatic mitral (valve) insufficiency: Secondary | ICD-10-CM | POA: Diagnosis not present

## 2015-03-09 DIAGNOSIS — I519 Heart disease, unspecified: Secondary | ICD-10-CM | POA: Diagnosis not present

## 2015-03-10 DIAGNOSIS — S2239XA Fracture of one rib, unspecified side, initial encounter for closed fracture: Secondary | ICD-10-CM

## 2015-03-10 HISTORY — DX: Fracture of one rib, unspecified side, initial encounter for closed fracture: S22.39XA

## 2015-03-13 DIAGNOSIS — J431 Panlobular emphysema: Secondary | ICD-10-CM | POA: Diagnosis not present

## 2015-03-13 DIAGNOSIS — G4734 Idiopathic sleep related nonobstructive alveolar hypoventilation: Secondary | ICD-10-CM | POA: Diagnosis not present

## 2015-03-13 DIAGNOSIS — J9819 Other pulmonary collapse: Secondary | ICD-10-CM | POA: Diagnosis not present

## 2015-03-13 DIAGNOSIS — R0609 Other forms of dyspnea: Secondary | ICD-10-CM | POA: Diagnosis not present

## 2015-03-22 DIAGNOSIS — J9811 Atelectasis: Secondary | ICD-10-CM | POA: Diagnosis not present

## 2015-03-22 DIAGNOSIS — Z720 Tobacco use: Secondary | ICD-10-CM | POA: Diagnosis not present

## 2015-03-22 DIAGNOSIS — R918 Other nonspecific abnormal finding of lung field: Secondary | ICD-10-CM | POA: Diagnosis not present

## 2015-03-22 DIAGNOSIS — R05 Cough: Secondary | ICD-10-CM | POA: Diagnosis not present

## 2015-03-22 DIAGNOSIS — J431 Panlobular emphysema: Secondary | ICD-10-CM | POA: Diagnosis not present

## 2015-03-24 ENCOUNTER — Other Ambulatory Visit: Payer: Self-pay | Admitting: Specialist

## 2015-03-24 DIAGNOSIS — R918 Other nonspecific abnormal finding of lung field: Secondary | ICD-10-CM

## 2015-03-28 NOTE — Consult Note (Signed)
PATIENT NAME:  Marvin Lewis, DRUMMOND MR#:  [id number] DATE OF BIRTH:  1951/12/06  DATE OF CONSULTATION:  09/19/2012  REFERRING PHYSICIAN:   CONSULTING PHYSICIAN:  Jill Side, MD  REASON FOR CONSULTATION: PEG tube placement.   HISTORY OF PRESENT ILLNESS: This is a 64 year old male with multiple medical problems who was admitted many days ago with respiratory failure. The patient has been on vent for a while and attempts to wean him off the ventilator have failed. The patient underwent a tracheostomy and is currently on a trach collar. The patient has multiple other medical issues including toxic metabolic encephalopathy, C. difficile colitis, pneumonia, history of alcohol abuse with DTs, acute renal failure, diabetes, chronic anemia, urinary tract infection, hip fracture, chronic hepatitis C, etc. Gastroenterology was consulted for consideration for feeding tube placement. The patient is currently being fed through an NG tube and seems to be tolerating his feedings well. The patient is nonverbal due to tracheostomy and, therefore, no history is available from the patient.   PAST MEDICAL HISTORY: As above.   ALLERGIES: No known drug allergies.   SOCIAL HISTORY: According to the chart, positive for alcohol abuse.   FAMILY HISTORY: Not available as well as review of systems.   CURRENT MEDICATIONS IN THE HOSPITAL:  1. Fentanyl drip. 2. Versed drip. 3. Precedex drip.  4. Ferrous Sulfate.  5. Tylenol p.r.n.  6. Morphine injections. 7. Zofran injections p.r.n.  8. Haldol. 9. Sliding scale insulin protocol. 10. Geodon injection. 11. Multivitamins. 12. Lasix. 13. Versed p.r.n.  14. Potassium chloride.  15. Lorazepam. 16. Metoclopramide. 17. Lovenox. 18. Hydralazine. 19. Lantus at bedtime.  20. Pantoprazole.  21. Metronidazole for C-diff. 22. Seroquel which was recently started.   PHYSICAL EXAMINATION:  GENERAL: The patient appears to be fairly well built. He does not appear to be  in any acute distress. He is not jaundiced.   VITAL SIGNS: His vitals are fairly stable. He has a low-grade fever of 100.3, respirations varies according to his level of consciousness, pulse in between 80 and 100, blood pressure 118/65.   LUNGS: Grossly clear to auscultation bilaterally with some scattered wheezes.   CARDIOVASCULAR: Regular rate and rhythm. No murmurs were heard.   ABDOMEN: Quite soft and benign, nontender, nondistended. No hepatosplenomegaly or ascites was noted.   NEUROLOGIC: He is easily arousable.   LABORATORY STUDIES: His most recent BUN 18, creatinine 0.98. The rest of the electrolytes are fairly unremarkable. Magnesium is 1.9, white cell count 8.6, hemoglobin 8.9, hematocrit 26.9, platelet count 211. Stool for C. difficile toxin recently tested positive. Chest x-ray done yesterday showed improved aeration in the lung bases. Mild cardiomegaly was noted. NG tube is in place.   ASSESSMENT AND PLAN: The patient is with respiratory failure, multiple other medical issues, as well as some psychiatric issues. The patient is being fed through an NG tube, seems to be tolerating the feeding very well. There is an anticipated prolonged need for respiratory support, and, therefore, a tracheostomy was done which he has tolerated well. Agree with the feeding tube placement. This will be discussed further with the patient's wife and if they are all in agreement will proceed with feeding tube placement next week. Further recommendations to follow.   ____________________________ Jill Side, MD si:ap D: 09/19/2012 11:16:30 ET T: 09/19/2012 11:49:00 ET JOB#: [id number]  cc: Jill Side, MD, <Dictator> Jill Side MD ELECTRONICALLY SIGNED 09/29/2012 17:22
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
## 2015-03-28 NOTE — Consult Note (Signed)
Impression: 64yo WM w/ h/o DM, chronic hep C infection, COPD with acute respiratory failure, DTs admitted with left hip fracture, s/p ORIF who developed DTs with acute respiratory failure requiring intubation, subsequent Klebsiella pneumonia who now has C. diff colitis.  His respiratory status continues to improve.  I would not treat the Klebsiella in the sputum as clinically he is doing well. Agree with oral metronidazole.  Would treat for 10 days. Continue vent support as needed. If he spikes >101.5 or has worsening respiratory symptoms, would repeat CXR and cultures.  I would avoid empiric antibiotics unless he is clnically unstable as this will provide further selective pressure for C. diff. 5) I am not sure about his hep C infection.  I do not if he has a positive PCR or if has received any treatment.  This could be addressed as an outpt if it has not already been done.  Electronic Signatures: Naryiah Schley, Heinz Knuckles (MD) (Signed on 11-Oct-13 15:15)  Authored   Last Updated: 11-Oct-13 15:26 by Manar Smalling, Heinz Knuckles (MD)

## 2015-03-28 NOTE — Consult Note (Signed)
Chief Complaint:   Subjective/Chief Complaint Agree with speech evaluation. It is my understanding that speech is planning to obtain a modified barium swallow today. As patient has refused PEG in the past and his wife is reluctant to make a decision, I believe  speech evaluation and modified barium swallow will help determine the correct line of action. Will follow.   Electronic Signatures: Jill Side (MD)  (Signed 17-Oct-13 08:55)  Authored: Chief Complaint   Last Updated: 17-Oct-13 08:55 by Jill Side (MD)

## 2015-03-28 NOTE — Consult Note (Signed)
Brief Consult Note: Diagnosis: Alcohol dependence.   Patient was seen by consultant.   Recommend further assessment or treatment.   Orders entered.   Discussed with Attending MD.   Comments: Will admitt to [facility]U.  Electronic Signatures: Orson Slick (MD)  (Signed 07-Aug-13 11:35)  Authored: Brief Consult Note   Last Updated: 07-Aug-13 11:35 by Orson Slick (MD)
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
## 2015-03-28 NOTE — Op Note (Signed)
PATIENT NAME:  Marvin Lewis, Marvin Lewis MR#:  [id number] DATE OF BIRTH:  1951/07/24  DATE OF PROCEDURE:  09/11/2012  PREOPERATIVE DIAGNOSIS: Failure to extubate.  POSTOPERATIVE DIAGNOSIS: Failure to extubate.  OPERATION PERFORMED: Tracheostomy.   SURGEON: Roena Malady, M.D.   OPERATIVE FINDINGS: Normal anterior neck anatomy.   DESCRIPTION OF PROCEDURE: [name] was identified in the holding area and taken to the Operating Room and placed in the supine position. After general endotracheal anesthesia, the patient's neck was easily identifiable. The suprasternal notch was palpated as was the cricoid cartilage. The incision line was marked approximately two-fingerbreadths above the sternal notch. A local anesthetic of 1% lidocaine with 1:100,000 units of epinephrine was used to inject along the incision line. A total of 3 mL was used. With the neck prepped and draped sterilely, a 15 blade was used to incise down to and into the subcutaneous tissues. Hemostasis was achieved using the Bovie cautery. Strap muscles were identified in the midline and retracted laterally. The cricoid cartilage could easily be palpated. The soft tissue was gently divided using the Bovie cautery down to the cricoid. The Bovie was then used to excise through the pretracheal fascia on the cricoid and a hemostat was used to dissect in that supratracheal plane beneath the isthmus of the thyroid. The isthmus and other soft tissue anteriorly was divided using the Harmonic scalpel. This gave excellent exposure to the anterior trachea. A cricoid hook was then placed and the cricoid was suspended superiorly. An incision was then made between the second and third tracheal cartilage into the airway. Scissors were then used to excise laterally down creating a Bjork flap. A 3-0 Vicryl was then used to suture this Bjork flap up to the inferior skin flap. The endotracheal tube was advanced outward. A #8 cuffed, nonfenestrated Shiley tracheostomy  tube was placed within the tracheostomy site. The balloon was inflated. The anesthetic airway was reestablished and there was excellent return of CO2 with equal breath sounds. The trach was then sutured in position using four-corner interrupted 2-0 silk sutures followed by a soft trach tie. Gauze was placed beneath the trach site and soaked with Betadine. The patient was then returned to anesthesia where he was awakened in the Operating Room and taken directly back to the Intensive Care Unit in stable condition.   CULTURES: None.   SPECIMENS: None.   ESTIMATED BLOOD LOSS: Less than 10 mL. ____________________________ Roena Malady, MD ctm:slb D: 09/11/2012 11:50:02 ET T: 09/11/2012 11:56:20 ET JOB#: [id number]  cc: Roena Malady, MD, <Dictator> Roena Malady MD ELECTRONICALLY SIGNED 09/18/2012 7:52

## 2015-03-28 NOTE — H&P (Signed)
Subjective/Chief Complaint Left hip pain status post fall    History of Present Illness Patient is a 64 y/o male with a complicated medical history fell today at home.  He explains that he fell when trying to stand up from the toilet and his legs were numb.  He was diagnosed with a left intertrochanteric hip fracture upon presentation to the [facility] ED.  He denies other injuries.   Past Med/Surgical Hx:  Hepatitis A & C:   Dysphagia: previous MBSS ~2 yrs ago  Multi-drug Resistant Organism (MDRO): Positive culture for MRSA.  Pneumonia:   Pancreatitis:   htn:   diabetic:   Colectomy:   Repair perforated colon:   Right shoulder separation:   Hernia surgery:   ALLERGIES:  No Known Allergies:   HOME MEDICATIONS: Medication Instructions Status  Prilosec 20 mg oral delayed release capsule 1 cap(s) orally once a day Active  clonidine 0.2 mg/24 hr transdermal film, extended release 1 patch transdermal once a week on Sunday Active  Lantus Solostar Pen 100 units/mL subcutaneous solution 30 unit(s) subcutaneous once a day (at bedtime) Active  NovoLog FlexPen 100 units/mL subcutaneous solution  subcutaneous , As Needed per sliding scale Active  metoprolol tartrate 25 mg oral tablet 1 tab(s) orally 2 times a day Active   Family and Social History:   Family History Non-Contributory    Social History positive  tobacco, positive ETOH    + Tobacco Current (within 1 year)    Place of Living Home   Review of Systems:   Subjective/Chief Complaint Left hip pain    Medications/Allergies Reviewed Medications/Allergies reviewed   Physical Exam:   GEN no acute distress    HEENT PERRL, hearing intact to voice, moist oral mucosa, Oropharynx clear, poor dentition    NECK supple  No masses  trachea midline    RESP normal resp effort  clear BS  no use of accessory muscles    CARD regular rate  no murmur  no JVD    ABD denies tenderness  soft  normal BS    EXTR Left lower extremity  shortened and externally rotated.  Pedal pulses are palpable.  Sensation intact to light touch.  Motor function intact.    SKIN normal to palpation    NEURO motor/sensory function intact    PSYCH A+O to time, place, person, good insight   Lab Results: Hepatic:  19-Sep-13 11:35    Bilirubin, Total  1.2   Alkaline Phosphatase 101   SGPT (ALT) 53   SGOT (AST)  64   Total Protein, Serum 7.4   Albumin, Serum 3.7  Routine BB:  19-Sep-13 11:35    ABO Group + Rh Type -   Antibody Screen -    13:10    ABO Group + Rh Type O Positive   Antibody Screen NEGATIVE (Result(s) reported on 27 Aug 2012 at 02:35PM.)  Routine Chem:  19-Sep-13 11:35    Ethanol, S. < 3   Ethanol % (comp) < 0.003 (Result(s) reported on 27 Aug 2012 at 12:40PM.)   Glucose, Serum  106   Creatinine (comp) 1.10   Sodium, Serum 137   Potassium, Serum  3.0   Chloride, Serum  97   CO2, Serum 30   Calcium (Total), Serum 8.9   Osmolality (calc) 275   eGFR (African American) >60   eGFR (Non-African American) >60 (eGFR values <55m/min/1.73 m2 may be an indication of chronic kidney disease (CKD). Calculated eGFR is useful in patients with stable  renal function. The eGFR calculation will not be reliable in acutely ill patients when serum creatinine is changing rapidly. It is not useful in  patients on dialysis. The eGFR calculation may not be applicable to patients at the low and high extremes of body sizes, pregnant women, and vegetarians.)   Anion Gap 10   Result Comment TYPE AND SCREEN - SPECIMEN IS HEMOLYZED, RECOLLECT NEEDED  Result(s) reported on 27 Aug 2012 at 12:58PM.  Routine UA:  19-Sep-13 13:31    Color (UA) Amber   Clarity (UA) Hazy   Glucose (UA) Negative   Bilirubin (UA) Negative   Ketones (UA) Trace   Specific Gravity (UA) 1.028   Blood (UA) Negative   pH (UA) 5.0   Protein (UA) >=500   Nitrite (UA) Negative   Leukocyte Esterase (UA) Trace (Result(s) reported on 27 Aug 2012 at 01:47PM.)   RBC  (UA) 6 /HPF   WBC (UA) 8 /HPF   Bacteria (UA) 2+   Epithelial Cells (UA) <1 /HPF   Mucous (UA) PRESENT   Hyaline Cast (UA) 22 /LPF (Result(s) reported on 27 Aug 2012 at 01:47PM.)  Routine Coag:  19-Sep-13 11:35    Activated PTT (APTT) 27.6 (A HCT value >55% may artifactually increase the APTT. In one study, the increase was an average of 19%. Reference: "Effect on Routine and Special Coagulation Testing Values of Citrate Anticoagulant Adjustment in Patients with High HCT Values." American Journal of Clinical Pathology 2006;126:400-405.)   Prothrombin 13.0   INR 0.9 (INR reference interval applies to patients on anticoagulant therapy. A single INR therapeutic range for coumarins is not optimal for all indications; however, the suggested range for most indications is 2.0 - 3.0. Exceptions to the INR Reference Range may include: Prosthetic heart valves, acute myocardial infarction, prevention of myocardial infarction, and combinations of aspirin and anticoagulant. The need for a higher or lower target INR must be assessed individually. Reference: The Pharmacology and Management of the Vitamin K  antagonists: the seventh ACCP Conference on Antithrombotic and Thrombolytic Therapy. QIHKV.4259 Sept:126 (3suppl): [id number]. A HCT value >55% may artifactually increase the PT.  In one study,  the increase was an average of 25%. Reference:  "Effect on Routine and Special Coagulation Testing Values of Citrate Anticoagulant Adjustment in Patients with High HCT Values." American Journal of Clinical Pathology 2006;126:400-405.)  Routine Hem:  19-Sep-13 11:35    WBC (CBC) 9.6   RBC (CBC) 4.60   Hemoglobin (CBC) 13.7   Hematocrit (CBC) 41.1   Platelet Count (CBC)  101 (Result(s) reported on 27 Aug 2012 at 11:51AM.)   MCV 89   MCH 29.8   MCHC 33.4   RDW  18.4     Assessment/Admission Diagnosis Left intertrochanteric hip fracture    Plan I have explained the diagnosis to the patient, and  his wife and sister who are at the bedside.  I have recommended an open reduction internal fixation for the injury.  I used the white board in the patient's room to draw the injury and diagram the surgical procedure.  The risks and benefits of surgical intervention were discussed in detail with the patient. The patient expressed understanding of the risks and benefits and agreed with plans for surgery. The risks include, but are not limited to: infection, bleeding requiring transfusion, nerve and blood vessel injury, leg length discrepancy, change in lower extremity rotation, persistent hip pain, hardware failure or screw cutout and the need for more surgery including conversion to a total hip arthroplasty, DVT,  and PE, MI, stroke, pneumonia, respiratory failure and death.  Patient has been seen by the hosptialist, Dr. Bridgette Habermann.  We have discussed the case together.  The patient is deemed high risk for sugery given his history of aspiration pneumonia, MRSA pneumonia, stridor, EtOH use.  Ideally the surgery will be done under spinal anesthesia.  Both Dr. Bridgette Habermann and the anesthesiologist have requested a pre-operative pulmonary consult which I have ordered.  Patient is NPO after midnight.  Surgical site signed as per "right site surgery" protocol.   Plan is to proceed with surgery tomorrow AM.   Electronic Signatures: Thornton Park (MD)  (Signed 19-Sep-13 16:31)  Authored: CHIEF COMPLAINT and HISTORY, PAST MEDICAL/SURGIAL HISTORY, ALLERGIES, HOME MEDICATIONS, FAMILY AND SOCIAL HISTORY, REVIEW OF SYSTEMS, PHYSICAL EXAM, LABS, ASSESSMENT AND PLAN   Last Updated: 19-Sep-13 16:31 by Thornton Park (MD)

## 2015-03-28 NOTE — Consult Note (Signed)
Chief Complaint:   Subjective/Chief Complaint Case discussed with speech therapist. Patient has refused PEG despite detailed discussion of risks involved of not performing the procedure. Patient has refused modified barium swallow as well. Speech is going to make some recommendarions about diet although he continues to be at risk of aspiration because of his refusal to accept alternate route of feeding. Will sign off but please do not hesitate to call us if the pataient changes his decision. Thanks.   Electronic Signatures: Jill Side (MD)  (Signed 17-Oct-13 11:40)  Authored: Chief Complaint   Last Updated: 17-Oct-13 11:40 by Jill Side (MD)

## 2015-03-28 NOTE — Consult Note (Signed)
    Comments   Note decisions have been made by family for aggressive care. ENT has been consulted for trach. Please reconsult if we can be of assistance.   Electronic Signatures: Britian Jentz, Kirt Boys (NP)  (Signed 01-Oct-13 10:32)  Authored: Palliative Care   Last Updated: 01-Oct-13 10:32 by Irean Hong (NP)

## 2015-03-28 NOTE — Op Note (Signed)
PATIENT NAME:  Marvin Lewis, Marvin Lewis MR#:  [id number] DATE OF BIRTH:  26-Mar-1951  DATE OF PROCEDURE:  09/02/2012  PREOPERATIVE DIAGNOSIS: Respiratory difficulties.   POSTOPERATIVE DIAGNOSIS: Respiratory difficulties.   PROCEDURE: Flexible fiberoptic nasal laryngoscopy.   SURGEON: Osie Cheeks, MD   ANESTHESIA: Topical.   DESCRIPTION OF PROCEDURE: After anesthetizing the right nostril with topical lidocaine, a flexible fiberoptic scope was passed through the right nasal cavity. The patient was not able to follow directions but he tolerated the procedure reasonably well. Hypopharynx, larynx, and tongue base were inspected. There were a lot of dried secretions in the lower airway. I could not assess vocal cord motion as the patient could not phonate. However, vocal cords were clear and there was no medialization of the cords. With inspiration he did have collapse of the supraglottic airway from venturi effect. There is no evidence of any edema of the epiglottis or subglottis. There were no mass lesions of the larynx or tongue base. Scope was withdrawn. The patient tolerated the procedure well.   ____________________________ Sammuel Hines. Richardson Landry, MD psb:drc D: 09/02/2012 12:25:06 ET T: 09/02/2012 13:20:58 ET JOB#: [id number]  cc: Sammuel Hines. Richardson Landry, MD, <Dictator> Riley Nearing MD ELECTRONICALLY SIGNED 09/04/2012 14:07

## 2015-03-28 NOTE — Consult Note (Signed)
PATIENT NAME:  Marvin Lewis, Marvin Lewis MR#:  [id number] DATE OF BIRTH:  1951-05-20  DATE OF CONSULTATION:  09/02/2012  REFERRING PHYSICIAN:  Wallene Huh, MD CONSULTING PHYSICIAN:  Sammuel Hines. Richardson Landry, MD  REASON FOR CONSULTATION: Possible stridor.   HISTORY OF PRESENT ILLNESS: This is a 64 year old male who has a known history of a right vocal cord paralysis and admitted to the hospital with a hip fracture after sustaining a fall. Apparently there is a previous history of a cavitary lung lesion that was biopsied in the past and revealed carcinoid. The patient underwent surgery for his hip fracture 08/28/2012 and remained intubated postoperatively, but has subsequently himself and has continued to have noisy breathing that was concerning for potential stridor.   PAST MEDICAL HISTORY:  1. Hepatitis A and C.  2. Dysphagia. 3. History of MRSA. 4. History of pneumonia. 5. Pancreatitis.  6. Hypertension.  7. Diabetes. 8. History of carcinoid of the lung.   PAST SURGICAL HISTORY:  1. Colectomy and repair of perforated colon. 2. Right shoulder separation. 3. Herniorrhaphy. 4. Left hip surgery, as above.   ALLERGIES: No known drug allergies.   MEDICATIONS:  1. Tylenol 325 mg two p.o. every 4 hours p.r.n.  2. Dulcolax 10 mg rectally at bedtime p.r.n. constipation.  3. Calcium carbonate one tablet orally twice a day with meals. 4. Clonidine 0.2 mg patch weekly.  5. Iron sulfate 325 mg p.o. twice a day. 6. Insulin 15 units subcutaneously at bedtime.  7. Sliding scale insulin.  8. Labetalol 10 mg IV push every four hours p.r.n. hypertension.  9. Metoprolol 25 mg p.o. twice a day. 10. Morphine 1 to 2 mg IV push every 1 to 2 hours p.r.n. pain.  11. Multivitamin one p.o. daily.  12. Nitroglycerin 2% ointment topically every six hours p.r.n. angina.  13. Zofran 4 mg IV every 4 hours p.r.n. nausea and vomiting. 14. Oxycodone 5 to 10 mg p.o. every 3 to 4 hours p.r.n. pain.  15. Lovenox 30 mg  subcutaneous twice a day. 16. Nitroglycerin patch 0.6 mcg topically daily.  17. Protonix 40 mg IV push every a.m. 18. Ativan 1 mg IV push every four hours p.r.n. agitation. 19. Haldol 2 mg IV every four hours p.r.n. agitation. 20. Hydralazine 5 mg IV push every six hours. 21. Geodon 20 mg IM every 12 hours p.r.n.   SOCIAL HISTORY: Unobtainable.   REVIEW OF SYSTEMS: Unobtainable.   PHYSICAL EXAMINATION:   VITAL SIGNS: Temperature 96.1, pulse 78, respirations 17, and blood pressure 183/104.  GENERAL: Obtunded male breathing with noisy respirations. He is not able to answer questions.   HEAD AND FACE: Head is normocephalic, atraumatic. No facial skin lesions. Facial strength cannot be assessed.   NOSE: External nose unremarkable. Nasal cavity is clear. No purulence is seen. Septum is relatively straight.  ORAL CAVITY AND OROPHARYNX: Teeth, lips, and gums are unremarkable. Tongue and floor of mouth are without lesions. Posterior pharynx reveals soft tissue redundancy of the palate and uvula without masses.   NECK: The neck is supple without adenopathy or mass. There is no thyromegaly. Salivary glands are soft and nontender without masses.   PROCEDURE: I assessed the patient's airway. A jaw thrust was performed and I was able to eliminate the airway noises that he was producing which would weigh against stridor. Given his history of vocal cord paralysis, I did go ahead and perform a fiber-optic nasal laryngoscopy. He has lots of dried secretions in the lower pharynx. The vocal cords lateralized,  but he has paradoxical motion inward with inspiration. I really could not adequately assess vocal cord motion as the patient could not phonate, but he does have a patent airway with no swelling of the epiglottis or vocal cords or mass lesions or subglottic edema. This appears to be upper airway mechanical collapse from Venturi effect.  ASSESSMENT: This patient appears to have more issues of sleep apnea  in his obtunded state than any evidence of lower airway stridor. There is certainly no fixed obstruction to prevent him from moving air appropriately.   PLAN: Obviously lightening his sedation could be helpful if possible, although the patient has been agitated so this makes things difficult. We are trying him on both CPAP and BiPAP, but he is taking the device off. Dr. Raul Del has some other thoughts and will take over from here on managing his obstructive events. There is certainly no need for any acute airway intervention, although if he were to decompensate from a respiratory standpoint intubation would be necessary. He has no need for acute surgical intervention on his airway, however.  ____________________________ Sammuel Hines. Richardson Landry, MD psb:slb D: 09/02/2012 12:23:12 ET T: 09/02/2012 13:17:44 ET JOB#: [id number]  cc: Sammuel Hines. Richardson Landry, MD, <Dictator> Riley Nearing MD ELECTRONICALLY SIGNED 09/04/2012 14:07

## 2015-03-28 NOTE — Op Note (Signed)
PATIENT NAME:  Marvin Lewis, Marvin Lewis MR#:  [id number] DATE OF BIRTH:  May 17, 1951  DATE OF PROCEDURE:  08/28/2012  PREOPERATIVE DIAGNOSIS: Right intertrochanteric hip fracture.   POSTOPERATIVE DIAGNOSIS: Right intertrochanteric hip fracture.   OPERATION: Open reduction internal fixation of right intertrochanteric hip fracture.   ANESTHESIA: Spinal with general using an LMA.   SURGEON: Timoteo Gaul, MD   COMPLICATIONS: None.   ESTIMATED BLOOD LOSS: 300 mL.   INDICATIONS FOR PROCEDURE: The patient is a 64 year old male who fell while getting off the toilet at home. He had significant pain and inability to walk on the right lower extremity status post fall. He was diagnosed with an intertrochanteric hip fracture upon arrival at the [facility] Emergency Department. He was admitted to the Orthopedic Surgery service for further evaluation and management. The patient was cleared for surgery by the hospitalist service. He was found to be at high risk given his history of aspiration pneumonia. He was also evaluated by Pulmonary preoperatively and cleared for surgery, although he was deemed fairly high risk for this procedure. I reviewed the risks and benefits of surgery with the patient and his wife and sister. They understood the risks include infection, bleeding requiring blood transfusion, nerve or blood vessel injury, nonunion, malunion, persistent right hip pain, failure of the hardware, cut out of the screw from the patient's bone and the need for further surgery including conversion to a total hip arthroplasty. Medical complications include DVT and pulmonary embolism, myocardial infarction, stroke, pneumonia, respiratory failure, and death. The patient is at high risk for respiratory failure given his history.   PROCEDURE NOTE: The patient was marked with the word yes over the right hip according to the hospital's right site protocol. He was brought to the operating room where he underwent  a spinal anesthetic. The patient had his left leg placed in a leg holder for traction. The right leg was placed in a hemi-lithotomy position. All bony prominences were adequately padded. The patient was then prepped and draped in a sterile fashion. A time-out was performed to verify the patient's name, date of birth, medical record number, correct site of surgery, and correct procedure to be performed. It was also used to confirm that all appropriate instruments, implants, and radiographic studies were available in the room. Once all in attendance were in agreement, the case began.   The patient had episodes of apnea and was given an LMA to protect his airway by the anesthesia service.   C-arm images of the patient's left hip were taken in both the AP and lateral plane. The patient had gentle traction and external rotation applied to the right hip to reduce the fracture. C-arm was also used to evaluate the level of the fracture. Proposed incision based upon these C-arm images was drawn. A #10 blade was used to make a lateral incision. The subcutaneous tissue was carefully dissected using electrocautery. The fascia lata was then identified and a linear incision was made in it with a #10 blade. The vastus lateralis muscle was then identified. It was reflected anteriorly off the intramuscular septum by a Bennett retractor. A second Bennett retractor was placed posterior to the femur. Electrocautery and a key elevator were used to remove the vastus lateralis and the lateral cortex. A 140 degree drill guide was then placed along the lateral cortex of the femur. A drill guide was advanced across the fracture site and into the femoral head. Its position was confirmed on AP and lateral C-arm projections.  Once in good position, the guidepin was measured with a depth gauge to 115 mm. This was then overdrilled with a long barrel drill to a depth of 115 mm. Then a Synthes DHS 140 degree five hole plate was assembled on the  back table with a 115 mm lag screw. This was then inserted through the drill hole to a depth of 115 mm until it reached the cortical bone. The final position was confirmed on AP and lateral C-arm projections. The lateral Synthes DHS five hole plate was then malleted carefully into position along the lateral femur. Bicortical drill holes were then made through each of the five holes of the plate. The screws were then advanced into position by power with the last few rotations by hand to avoid stripping the screws. Traction was released. Final AP and lateral C-arm projections of the Synthes construct was performed. The wound was then copiously irrigated. The fascia lata was closed with interrupted 0 Vicryl, the subcutaneous tissue was closed with 2-0 Vicryl, and the skin closed with staples. I was scrubbed and present for the entire case and all sharp and instrument counts were correct at the conclusion of the case.   I spoke with the patient's family in the postop consultation room to let them know the case had gone without complication and the patient was stable in the recovery room. He was extubated and breathing on his own.  ____________________________ Timoteo Gaul, MD klk:drc D: 08/31/2012 17:36:22 ET T: 08/31/2012 17:56:47 ET JOB#: [id number] Timoteo Gaul MD ELECTRONICALLY SIGNED 09/05/2012 13:12

## 2015-03-28 NOTE — Consult Note (Signed)
Chief Complaint:   Subjective/Chief Complaint status post hip repair this afternoon, requesting pain meds.   VITAL SIGNS/ANCILLARY NOTES: **Vital Signs.:   20-Sep-13 15:23   Vital Signs Type Post-Op   Temperature Temperature (F) 98.3   Celsius 36.8   Temperature Source Oral   Pulse Pulse 113   Respirations Respirations 20   Systolic BP Systolic BP 161   Diastolic BP (mmHg) Diastolic BP (mmHg) 83   Mean BP 95   Pulse Ox % Pulse Ox % 96   Oxygen Delivery 2L; Nasal Cannula   Brief Assessment:   Cardiac Regular  no murmur    Respiratory normal resp effort  clear BS    Gastrointestinal Normal   Radiology Results: XRay:    20-Sep-13 14:07, Pelvis AP Only   Pelvis AP Only    REASON FOR EXAM:    post op in PACU  COMMENTS:   Bedside (portable):Y    PROCEDURE: DXR - DXR PELVIS AP ONLY  - Aug 28 2012  2:07PM     RESULT: Two AP portable films reveal the patient to have undergone ORIF   for an intertrochanteric fracture of theleft hip. Radiographic   positioning of the fracture fragments is now more nearly anatomic. An   orthopedic telescoping screw and sideplate are present.    IMPRESSION:  The patient has undergone ORIF for intertrochanteric   fracture the left hip. Further interpretation is deferred to Dr.   Mack Guise.     Dictation Site: 2    Verified By: DAVID A. Martinique, M.D., MD   Assessment/Plan:  Assessment/Plan:   Assessment 64 yo with chronic dysphagia, dm, ,htn, post op resp failure and long hospitalization earlier this year with multiple issues inclyding mrsa and aspiration pna, candida infxn, renal failure,  with hx of alcohol abuse last drink on monday a pint pw  * urinary tract infection: rocephin, urine c/s *leukocytosis: likley due to urinary tract infection  *alcohol abuse: ciwa *htn: bb/clonidine *dm: half of lantus, ssi *copd: not wheezing. inhalers. *Right intertrochanteric hip fracture s/p mechanical fall. can resume bb/clonidine if blood pressure  is stable    Plan time spent: 15 mins   Electronic Signatures: Remer Macho (MD)  (Signed 20-Sep-13 17:03)  Authored: Chief Complaint, VITAL SIGNS/ANCILLARY NOTES, Brief Assessment, Radiology Results, Assessment/Plan   Last Updated: 20-Sep-13 17:03 by Remer Macho (MD)

## 2015-03-28 NOTE — Consult Note (Signed)
PATIENT NAME:  Marvin Lewis, Marvin Lewis MR#:  [id number] DATE OF BIRTH:  1951/03/12  DATE OF CONSULTATION:  09/02/2012  REFERRING PHYSICIAN:  Max Sane, MD CONSULTING PHYSICIAN:  Steva Colder. Nicolasa Ducking, MD  REASON FOR CONSULTATION: Alcohol withdrawal delirium and alcohol dependence.   IDENTIFYING INFORMATION: Marvin Lewis is a 64 year old married Caucasian male with a long history of alcohol dependence. He currently lives with his wife in the [place] area. He is unemployed and on disability.   HISTORY OF PRESENT ILLNESS: Marvin Lewis is a 64 year old married Caucasian male with a history of alcohol dependence since his late teens as well as some possible depression. Per his wife he was admitted to the medicine service initially after he had a fall and is currently status post internal fixation of the right hip. The patient was apparently on the toilet and when he got up had a fall and tripped over his pant, resulting in a hip fracture on the right. After surgery the patient has had problems with alcohol withdrawal symptoms and delirium and was intubated and brought from the medicine service to the critical care unit. Per prior records and per his wife, the patient had been hospitalized at [facility] from July 15, 2012 to July 20, 2012 and then entered the IOP program with Dr. Marcello Moores for outpatient substance abuse treatment. He was discharged from the program on August 11, 2012, after missing several meetings per his wife. He was sober for 1 week after discharge from the program but then started back drinking close to a fifth of vodka on a daily basis. The patient's wife says that a lot of his drinking is due to depression, although notes from his prior inpatient hospitalization at [facility] do not indicate a diagnosis of depression, and the patient was not on any antidepressant. His wife reports that the patient has a lot of problems with poor self image and some "childhood issues" related to growing up in a family with  alcoholism. His brother is a heavy alcoholic, and the patient per his wife always compares himself to his brother. Per his wife and prior records there are no history of any prior suicide attempts or inpatient psychiatric hospitalizations other than the one for detox in August at [facility]. There is no history of any symptoms consistent with bipolar mania. There is a history of cocaine abuse in the past but the patient has been clean from cocaine for several years. The patient himself was restless and agitated in the bed. He was confused and disoriented. He was unable to answer any questions appropriately or converse appropriately. He is currently on Precedex and self extubated himself this morning in an agitated state.   PAST PSYCHIATRIC HISTORY: The patient did have a history of substance abuse treatment for cocaine abuse over 25 years ago in [place]. He was hospitalized at [facility] from July 15, 2012 to July 20, 2012, for alcohol dependence and has had several weeks' treatment in an IOP program with Dr. Marcello Moores at [facility]. He has been drinking alcohol since the age of 71. There is no history of any prior suicide attempts. He is not currently followed by an outpatient psychiatrist and has not seen a psychiatrist in the past on an outpatient basis.   FAMILY PSYCHIATRIC HISTORY: Per his wife his older brother is an alcoholic, and a lot of the family members on his mother's side including aunts, uncles, and first cousins are alcoholics.   PAST MEDICAL HISTORY: Diabetes, chronic obstructive pulmonary disease, hypertension, history of aspiration  pneumonia, history of MRSA infection on his lip, recent internal fixation of the right hip due to right hip fracture, history of 3 neck surgeries. Hepatitis C.   ALLERGIES: No known drug allergies.   CURRENT MEDICATIONS WHILE IN THE HOSPITAL:  1. Calcium carbonate 500 mg/vitamin D 1 tablet p.o. b.i.d.  2. Docusate calcium 240 mg at bedtime. 3. Ferrous sulfate 325 mg p.o.  b.i.d. with meals. 4. Hydralazine 5 mg IV push every 6 hours. 5. Methylprednisolone injection 40 mg IV every 8t hours.  6. Metoprolol 25 mg p.o. b.i.d.   7. Multivitamin 1 tablet p.o. daily.  8. Nitroglycerin patch 0.6 mg daily.  9. Protonix 40 mg IV at 6:00 a.m. every morning.  10. Lovenox 30 mg subcutaneous b.i.d.  11. Lantus insulin 15 units subcutaneous at bedtime.  12. Sliding scale insulin. 13. Chlorhexidine liquid kit for mouth care. 14. Decadron injection 6 mg IV push once given on September 02, 2012. 15. Ativan 2 mg every 2 hours p.r.n.  16. Haldol 2 mg every 4 hours p.r.n., recently suspended.  MEDICATIONS PRIOR TO ADMISSION:  1. Clonidine patch 0.2 mg extended release 1 patch once a week.  2. Lantus 30 units at bedtime. 3. Metoprolol 25 mg p.o. b.i.d.  4. NovoLog FlexPen sliding scale insulin p.r.n.  5. Prilosec 20 mg p.o. daily.  6. No psychotropic medication.   SOCIAL HISTORY: The patient was born and raised in [place]. Neither of his parents were alcoholics but there were several family members that were alcoholics. He has been married for 42 years and has 2 adult children. His younger son lives with him and his wife. He used to be a Camera operator of all trades, doing things such as Marketing executive, but has been on disability for several years now. He denies any history of any physical or sexual abuse.    LEGAL HISTORY: He does have a history of 3 DUIs.    SUBSTANCE ABUSE HISTORY: The patient does have a history of cocaine abuse, in full remission. He underwent treatment for cocaine abuse over 25 years ago. He has been drinking alcohol heavily since the age of 89. There is no history of any opioid or benzodiazepine dependence per his wife. No history of any stimulant use.   MENTAL STATUS EXAM: Marvin Lewis is a 64 year old Caucasian male who is lying in his hospital bed in a hospital gown. He had oxygen in place but was quite restless with his leg hanging over the bed. He  was agitated and pulling out tubes and IV. Patient was disoriented and unable to answer all questions. He did appear to have stridor, and attention and concentration were poor. Judgment and insight were poor. He could not answer questions with regards to memory and recall. He could not answer questions regarding suicidal or homicidal thoughts. He was not able answer questions with regards to whether not he is having hallucinations but is clearly confused and delusional.   SUICIDE RISK ASSESSMENT: At the time I am unable to interview the patient secondary to altered mental status and agitation. He has no history of any prior suicide attempts but does have a history of alcohol dependence and appears to be in DTs.   REVIEW OF SYSTEMS: Unable to assess secondary to altered mental status and agitation.   PHYSICAL EXAMINATION:    VITAL SIGNS: Blood pressure 183/104, pulse 78, respirations 17, temperature 96.1, pulse oximetry 100% on room air.  Please see initial physical exam as completed by Dr. Thornton Park.  LABORATORIES: Last glucose on September 02, 2012, was 226. Last BMP on August 31, 2012, showed a sodium 139, potassium 4.8, chloride 105, CO2 of 22, BUN 27, creatinine 1.0, calcium 7.8. CBC on August 31, 2012, showed white blood cell count of 7.4, hemoglobin is 7.6, platelet count of 82,000. Urinalysis on admission was nitrite negative, 8 WBCs, 2+ bacteria, less than 1 epithelial cell present.   No CT of the head or MRI of the head has been completed. Chest x-ray on September 01, 2012, showed patchy right lung base atelectasis, borderline cardiomegaly.   Chest x-ray on August 29, 2012, showed minimal increased density in the right lung base.   DIAGNOSES:  AXIS I: Delirium, may be secondary to alcohol withdrawal/delirium tremens. Rule out delirium secondary to underlying medical conditions including possible urinary tract infection, anemia, and hepatic encephalopathy given history of  hepatitis C. Rule out major depression. Alcohol dependence, and history of cocaine abuse in full remission.   AXIS II: Deferred.  AXIS III: Hepatitis C. Diabetes. Chronic obstructive pulmonary disease. Hypertension. Recent internal fixation of the right hip secondary to right hip fracture.  AXIS IV: Moderate to severe comorbid substance use, noncompliance with substance abuse treatment and psychiatric treatment. Unemployed and on disability. History of legal problems.  AXIS V: Global assessment of function at present equals 15.   ASSESSMENT AND TREATMENT RECOMMENDATIONS: Mr. Sinn is a 64 year old married Caucasian male with a long history of alcohol dependence who was admitted to the surgical service after sustaining a right hip fracture from a fall. He is recently status post internal fixation of the right hip fracture. He had recently underwent intubation secondary to alcohol withdrawal and respiratory distress, but self extubated himself this morning and has been agitated.   RECOMMENDATIONS:  1. Delirium may be secondary to alcohol withdrawal delirium, DTs, versus delirium from underlying medical conditions. We will go ahead and plan to restart the patient on Ativan 2 mg every 2 hours p.r.n. per CIWA for alcohol withdrawal anxiety and Haldol 2 mg every 4 hours p.r.n. for psychosis and agitation. We will go ahead and give Geodon 20 mg IM once now stat secondary to severe agitation. We will also recheck urinalysis to rule out underlying infection, ammonia level secondary to history of hepatitis C, Y07 and folic acid. We will also repeat CBC and complete metabolic panel.  2. Rule out major depression. The patient's wife seems to feel that his drinking is due to depression related to childhood issues and poor self-image. We will need to address underlying mood symptoms when the patient is more alert and oriented. We will also need to assess for suicidal thoughts as well.  3. We will plan to refer back  to IOP program with Dr. Marcello Moores versus a residential substance abuse treatment program if the patient is willing. We will consider [facility] for residential substance abuse treatment or [facility].    ____________________________ Steva Colder. Nicolasa Ducking, MD akk:vtd D: 09/02/2012 10:53:49 ET T: 09/02/2012 12:17:57 ET JOB#: [id number]  cc: Posie Lillibridge K. Nicolasa Ducking, MD, <Dictator> Chauncey Mann MD ELECTRONICALLY SIGNED 09/03/2012 16:22

## 2015-03-28 NOTE — Consult Note (Signed)
Brief Consult Note: Diagnosis: Respiratory failure with anticipated long term respiratory support.   Patient was seen by consultant.   Consult note dictated.   Comments: Respiratory failure s/p Trach. Tolerating tube feedings well but sill need PEG prior to discharge. Will discuss with family and plan on performing PEG next week if agreed by his wife. Will follow. Thanks.  Electronic Signatures: Jill Side (MD)  (Signed 12-Oct-13 11:07)  Authored: Brief Consult Note   Last Updated: 12-Oct-13 11:07 by Jill Side (MD)

## 2015-03-28 NOTE — H&P (Signed)
PATIENT NAME:  Marvin Lewis MR#:  [id number] DATE OF BIRTH:  1951/10/27  DATE OF ADMISSION:  07/15/2012  REFERRING PHYSICIAN: Lenise Arena, MD  ATTENDING PHYSICIAN: Orson Slick, MD   IDENTIFYING DATA: Mr. Marvin Lewis is a 64 year old male with a history of alcoholism.   CHIEF COMPLAINT: " I need detox."   HISTORY OF PRESENT ILLNESS: Mr. Marvin Lewis has been drinking since the age of 48. He is a binge drinker and every couple of weeks he starts drinking vodka in the afternoon until he passes out. This goes on for a week, and then he stops and recovers for several weeks. His longest period of sobriety ever was six months, and it was 30 years ago. The patient was brought to the hospital by his wife after a recent binge that lasted nine days. This is longer than usual and the wife was worried. The family has been supportive but wants the patient to get help. He is semi-committed to treatment. He denies any symptoms of depression, anxiety, or psychosis. No symptoms suggestive of bipolar mania. He does not use, other than alcohol, substances or prescription pills.   PAST PSYCHIATRIC HISTORY: He had one alcohol treatment in [place] in 1983. He has been drinking steady most of his life. He drinks vodka. There are no mood symptoms. No history of suicide attempt.   FAMILY PSYCHIATRIC HISTORY: None reported.   PAST MEDICAL HISTORY:  1. Diabetes. 2. Chronic obstructive pulmonary disease.  3. Hypertension.  4. History of three neck surgeries.  5. History of aspiration pneumonia. 6. Hypoacusis.  7. History of methicillin-resistant Staphylococcus aureus infection on his lip.   ALLERGIES: No known drug allergies.   MEDICATIONS ON ADMISSION:  1. Albuterol inhaler every 4 hours as needed. 2. Clonidine 0.2 mg weekly patch. 3. Insulin Lantus 30 units at bedtime.  4. Metoprolol 25 mg twice daily.  5. Omeprazole 20 mg daily.   SOCIAL HISTORY: He was born and raised in [place].  No history of abuse. He has been married for 42 years, has two adult children. His younger son lives with him and his wife. 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 He lost his driver's license to DUI 12 years ago. There is a remote history of cocaine abuse but did nothing recent.   REVIEW OF SYSTEMS: CONSTITUTIONAL: No fevers or chills. No weight changes. EYES: No double or blurred vision. ENT: Positive for hearing loss. RESPIRATORY: Positive for occasional shortness of breath. CARDIOVASCULAR: No chest pain or orthopnea. GASTROINTESTINAL: No abdominal pain, nausea, vomiting, or diarrhea. GU: No incontinence or frequency. ENDOCRINE: No heat or cold intolerance. LYMPHATIC: No anemia or easy bruising. INTEGUMENTARY: No acne or rash. Positive for history of MRSA, but it is well healed now. MUSCULOSKELETAL: No muscle or joint pain. NEUROLOGIC: No tingling or weakness. PSYCHIATRIC: See history of present illness for details.   PHYSICAL EXAMINATION:  VITAL SIGNS: Blood pressure is 177/104, pulse 99, respirations 20, temperature 98.7.   GENERAL: This is a well-developed male in no acute distress, slightly shaky.   HEENT: Pupils are equal, round and reactive to light. Sclerae are anicteric.   NECK: Supple. No thyromegaly.   LUNGS: Clear to auscultation. No dullness to percussion.   HEART: Regular rhythm and rate. No murmurs, rubs, or gallops.   ABDOMEN: Soft, nontender, nondistended. Positive bowel sounds,   MUSCULOSKELETAL: Normal muscle strength in all extremities.   SKIN: No rashes or bruises,  including lips.  LYMPHATIC: No cervical adenopathy.   NEUROLOGIC: Cranial nerves II through XII are intact.   LABORATORY DATA: Chemistries are within normal limits except for blood glucose of 140. Blood alcohol level on admission 0.34. LFTs are within normal limits except for AST of 84. TSH 1.29. Urine tox screen negative for  substances. CBC within normal limits. Urinalysis is not suggestive of urinary tract infection.   MENTAL STATUS EXAMINATION ON ADMISSION: The patient is alert and oriented to person, place, and situation. He knows that it is 2013 but that he is still drunk. He is pleasant, polite, and cooperative. He is in bed wearing hospital scrubs. He maintains some eye contact. Speech is soft and mumbled. Mood is okay with flat affect. Thought processing is logical and goal oriented. Thought content: He denies thoughts of hurting himself or others. There are no delusions or paranoia. There are no auditory or visual hallucinations. His cognition is grossly intact but hard to assess. His insight and judgment are poor.   SUICIDE RISK ASSESSMENT ON ADMISSION: This is a patient with a lifelong history of alcoholism but no signs of depression, anxiety, or psychosis.   AXIS I: Alcohol dependence.   AXIS II: Deferred.   AXIS III:  1. Diabetes.  2. Chronic obstructive pulmonary disease.  3. Hypertension.  4. Neck surgeries. 5. History of methicillin-resistant Staphylococcus aureus.   AXIS IV: Substance abuse.   AXIS V: Global Assessment of Functioning score on admission 35.   PLAN: The patient was admitted to [facility] for safety, stabilization and medication management. He was initially placed on suicide precautions and was closely monitored for any unsafe behaviors. He underwent full psychiatric and risk assessment. He received pharmacotherapy, individual and group psychotherapy, substance abuse counseling, and support from therapeutic milieu.   1. Alcohol detox: He was placed on CIWA protocol. He will be monitored for symptoms of alcohol withdrawal.  2. Medical: We will continue all his medications as prescribed by his providers in the community.  3. Substance abuse treatment: The patient is uncertain, but his wife believes that it is time that he goes to a  residential treatment program. The patient has [employer]. We will try to find the best option for him.  4. Disposition: Most likely he will return home with a plan to continue in a residential facility.  ____________________________ Marvin Lewis. Marvin Leriche, MD jbp:cbb D: 07/16/2012 18:25:57 ET T: 07/17/2012 06:29:56 ET JOB#: [id number]  cc: Kerrilynn Derenzo B. Marvin Leriche, MD, <Dictator> Clovis Fredrickson MD ELECTRONICALLY SIGNED 07/20/2012 7:38

## 2015-03-28 NOTE — Consult Note (Signed)
PATIENT NAME:  Marvin Lewis, Marvin Lewis MR#:  [id number] DATE OF BIRTH:  March 03, 1951  DATE OF CONSULTATION:  09/08/2012  REFERRING PHYSICIAN:  Dr. Raul Del  CONSULTING PHYSICIAN:  Roena Malady, MD  REASON FOR CONSULTATION: Possible tracheostomy.   HISTORY OF PRESENT ILLNESS: Patient well known to many of [place] at [place]. 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 Since then he has been intubated and has had failure to extubate.   PAST MEDICAL HISTORY:  1. Hepatitis. 2. Dysphagia. 3. MRSA. 4. Pneumonia. 5. Pancreatitis.  6. Diabetes. 7. Colectomy. 8. Perforated colon. 9. Right shoulder separation. 10. Hernia surgery.  ALLERGIES: He has know drug allergies.   MEDICATIONS: Noted and listed in the chart.   FAMILY HISTORY: Noncontributory.   SOCIAL HISTORY: Tobacco and alcohol abuse.   REVIEW OF SYSTEMS: He is currently intubated and sedated. Review of systems is unobtainable.   PHYSICAL EXAMINATION:  GENERAL: He is intubated and sedated.   EARS: The external ears appear normal.   NOSE: The anterior nose is clear.   ORAL CAVITY/OROPHARYNX: He has endotracheal tube in place.   NECK: Palpation of the neck relatively normal anatomy.   IMPRESSION AND RECOMMENDATIONS: Failure to extubate with multiple medical problems. I have discussed the risks and benefits with the family including bleeding, infection, and they were eager to proceed. We will try to get him scheduled for tracheostomy as quickly as possible.   ____________________________ Roena Malady, MD ctm:cms D: 09/08/2012 10:40:12 ET T: 09/08/2012 10:57:23 ET JOB#: [id number]  cc: Roena Malady, MD, <Dictator> Roena Malady MD ELECTRONICALLY SIGNED 09/18/2012 7:52

## 2015-03-28 NOTE — Consult Note (Signed)
Brief Consult Note: Diagnosis: left hip fx.   Patient was seen by consultant.   Consult note dictated.   Recommend to proceed with surgery or procedure.   Recommend further assessment or treatment.   Orders entered.   Discussed with Attending MD.   Comments: 64 yo with chronic dysphagia, dm, ,htn, post op resp failure and long hospitalization earlier this year with multiple issues inclyding mrsa and aspiration pna, candida infxn, renal failure,  with hx of alcohol abuse last drink on monday a pint pw *hip fx; sp mechanical fall.  no loc/sob/cp. dont appear to be withdrawling from alcohol. hx of dm/htn. no hx of mi/chf and cr>2. no active cardiac issues.  METS>4 as well.  However, hx of copd, chronic aspiration and post op res failure in past and at higher risk of having pulm complications. pt denies any spinal surg and i would recommend proceeding with surg and  spinal anesthesia.  if it can be done i would recommend pulm consult/support for optimization.  resume bb/clonidine *alcohol abuse: ciwa *htn: bb/clonidine *dm: half of lantus, ssi *copd: not wheezing. inhalers.  Electronic Signatures: Vivien Presto (MD)  (Signed 19-Sep-13 14:05)  Authored: Brief Consult Note   Last Updated: 19-Sep-13 14:05 by Vivien Presto (MD)

## 2015-03-28 NOTE — Consult Note (Signed)
Chief Complaint:   Subjective/Chief Complaint Patient was seen earlier for possible PEG. Will discuss with his wife when available and schedule for Wednesday if she agrees.   Electronic Signatures: Jill Side (MD)  (Signed 14-Oct-13 12:54)  Authored: Chief Complaint   Last Updated: 14-Oct-13 12:54 by Jill Side (MD)

## 2015-03-28 NOTE — Consult Note (Signed)
Chief Complaint:   Subjective/Chief Complaint confused, agitated today,restless, tries to sleep, slept about 2 hours at night, febrile to 102 last night, started on CIWA scale, h/o alcoholism, c/o insomnnia   VITAL SIGNS/ANCILLARY NOTES: **Vital Signs.:   21-Sep-13 12:20   Vital Signs Type Q 4hr   Temperature Temperature (F) 98   Celsius 36.6   Temperature Source Oral   Pulse Pulse 132   Respirations Respirations 24   Systolic BP Systolic BP 357   Diastolic BP (mmHg) Diastolic BP (mmHg) 64   Mean BP 77   Pulse Ox % Pulse Ox % 95   Pulse Ox Activity Level  At rest   Oxygen Delivery 2L    15:45   Vital Signs Type Pre-Blood   Temperature Temperature (F) 98.1   Celsius 36.7   Temperature Source Oral   Pulse Pulse 111   Respirations Respirations 23   Systolic BP Systolic BP 017   Diastolic BP (mmHg) Diastolic BP (mmHg) 75   Mean BP 93   Pulse Ox % Pulse Ox % 92   Oxygen Delivery Room Air/ 21 %    16:20   Vital Signs Type Pre-Blood   Temperature Temperature (F) 98.2   Celsius 36.7   Temperature Source oral   Pulse Pulse 88   Respirations Respirations 22   Systolic BP Systolic BP 793   Diastolic BP (mmHg) Diastolic BP (mmHg) 60   Mean BP 79   Pulse Ox % Pulse Ox % 93   Oxygen Delivery 2L   Brief Assessment:   Cardiac Regular  no murmur    Respiratory normal resp effort  clear BS  no use of accessory muscles    Gastrointestinal Normal    Gastrointestinal details normal Soft  Nontender  No masses palpable  Bowel sounds normal  No rebound tenderness   Lab Results: Routine Chem:  21-Sep-13 05:06    Glucose, Serum  114   BUN  28   Creatinine (comp)  1.65   Sodium, Serum 139   Potassium, Serum 4.3   Chloride, Serum 101   CO2, Serum 26   Calcium (Total), Serum  8.0   Anion Gap 12   Osmolality (calc) 284   eGFR (African American)  52   eGFR (Non-African American)  44 (eGFR values <58m/min/1.73 m2 may be an indication of chronic kidney disease (CKD). Calculated  eGFR is useful in patients with stable renal function. The eGFR calculation will not be reliable in acutely ill patients when serum creatinine is changing rapidly. It is not useful in  patients on dialysis. The eGFR calculation may not be applicable to patients at the low and high extremes of body sizes, pregnant women, and vegetarians.)  Routine Hem:  21-Sep-13 05:06    WBC (CBC) 10.5   RBC (CBC)  2.74   Hemoglobin (CBC)  8.3   Hematocrit (CBC)  25.1   Platelet Count (CBC)  63   MCV 92   MCH 30.3   MCHC 33.1   RDW  18.3   Neutrophil % 72.5   Lymphocyte % 12.9   Monocyte % 12.7   Eosinophil % 1.6   Basophil % 0.3   Neutrophil #  7.6   Lymphocyte # 1.3   Monocyte #  1.3   Eosinophil # 0.2   Basophil # 0.0 (Result(s) reported on 29 Aug 2012 at 05:57AM.)   Assessment/Plan:  Invasive Device Daily Assessment of Necessity:   Does the patient currently have any of the following indwelling  devices? foley    Indwelling Urinary Catheter continued, requirement due to    Reason to continue Indwelling Urinary Catheter preop or postop order according to surgical protocols   Assessment/Plan:   Assessment 64 yo with chronic dysphagia, dm, ,htn, post op resp failure and long hospitalization earlier this year with multiple issues inclyding mrsa and aspiration pna, candida infxn, renal failure,  with hx of alcohol abuse last drink on monday a pint pw  * urinary tract infection: rocephin, urine c/s taken, pending *leukocytosis: likley due to urinary tract infection , resolved.  *alcohol withdrawal, ciwa *htn: bb/clonidine, good control *dm: half of lantus, ssi, poor po intake, IVF *copd: not wheezing. inhalers. *Right intertrochanteric hip fracture s/p mechanical fall. can resume bb/clonidine if blood pressure is stable    Plan * fever, chest xray is OK, likely UTI, vs ETOH withdrawal, follow , get blood cx if needed * ac renal failrue, IVF at high rate, folllow in am time spent: 35  mins d/w wife extensively, all questions answered, voiced undersanding   Electronic Signatures: Theodoro Grist (MD)  (Signed 21-Sep-13 16:54)  Authored: Chief Complaint, VITAL SIGNS/ANCILLARY NOTES, Brief Assessment, Lab Results, Assessment/Plan   Last Updated: 21-Sep-13 16:54 by Theodoro Grist (MD)
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
## 2015-03-30 DIAGNOSIS — G9389 Other specified disorders of brain: Secondary | ICD-10-CM | POA: Diagnosis not present

## 2015-03-30 DIAGNOSIS — S2241XD Multiple fractures of ribs, right side, subsequent encounter for fracture with routine healing: Secondary | ICD-10-CM | POA: Diagnosis not present

## 2015-03-30 DIAGNOSIS — I638 Other cerebral infarction: Secondary | ICD-10-CM | POA: Diagnosis not present

## 2015-03-30 DIAGNOSIS — R918 Other nonspecific abnormal finding of lung field: Secondary | ICD-10-CM | POA: Diagnosis not present
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
## 2015-04-01 NOTE — Discharge Summary (Signed)
PATIENT NAME:  Marvin Lewis, Marvin Lewis MR#:  [id number] DATE OF BIRTH:  06/16/1951  DATE OF ADMISSION:  12/30/2013 DATE OF DISCHARGE:  01/04/2014  ADMITTING DIAGNOSIS: Acute renal failure and suspected urinary tract infection.   DISCHARGE DIAGNOSES: 1.  Acute renal failure due to dehydration, acute tubular necrosis. 2.  Dehydration due to poorly-controlled diabetes mellitus. 3.  Diabetes mellitus type 2, insulin-dependent, with hemoglobin A1c 13.3.  4.  Hyponatremia. 5.  Generalized weakness. 6.  Alcohol withdrawal delirium during this hospitalization.  7.  History of vocal cord dysfunction.  8.  Tobacco, alcohol abuse. 9.  Hepatitis C. 10.  Hyperlipidemia. 11.  Chronic obstructive pulmonary disease. 12.  Chronic aspirations in the past.  DISCHARGE CONDITION: Stable.   DISCHARGE MEDICATIONS: The patient is to resume:  1.  Metoprolol tartrate 25 mg p.o. twice daily. 2.  Albuterol/ipratropium 2.5/0.5 mg in 3 mL inhalation solution every 6 hours as needed. 3.  Citalopram 20 mg p.o. daily. 4.  Trazodone 100 mg p.o. at bedtime. 5.  Seroquel 50 mg p.o. in the morning. 6.  Benazepril 20 mg p.o. daily. 7.  Breo Ellipta 100/25 one puff once daily.  8.  Omeprazole 20 mg p.o. daily.  9.  Clonidine 0.1 mg every 8 hours.  10.  Lantus 45 units subcutaneously at bedtime, which is a new dose.  11.  Amlodipine 10 mg p.o. daily.  The patient is being discharged with home health physical therapy.   DIET: 2 gram salt, low-fat, low-cholesterol, carbohydrate-controlled diet, mechanical soft consistency. Advance to regular as tolerated.   REFERRAL: To home health physical therapy.    FOLLOWUP APPOINTMENT: With Dr. Jeananne Rama in 2 days after discharge.   CONSULTANTS: Care management, social work, Dr. Candiss Norse, Dr. Anthonette Legato.   RADIOLOGIC STUDIES: CT scan of abdomen and pelvis without contrast, 27th of January 2015, showed no urolithiasis or hydronephrosis. Foley catheter was placed. No  intra-abdominal or pelvic process. Status post appendectomy and rectosigmoid surgery. Slightly nodular contour of the liver equivocal for cirrhosis. Atherosclerosis of the great vessels, in addition to stable right common iliac artery aneurysm. Chest PA and lateral, 24th of January 2015, showed no definite radiographic evidence of acute cardiopulmonary disease. Sequelae of old granulomatous disease demonstrated with chronic scarring and volume loss in the right middle lobe.  The patient is a 64 year old Caucasian male with past medical history significant for history of alcohol and tobacco abuse, who presents to the hospital with complaints of dysuria as well as decreased urinary output. Please refer to Dr. Evie Lacks admission note done on the 22nd of January 2015.   On arrival to the hospital, the patient's vital signs: Temperature was 97.6. Pulse was 70. Respiratory rate was 20, blood pressure 101/70. Saturation was 93% on room air. Physical exam was unremarkable. The patient's lab data revealed elevation of BUN and creatinine to 52 and 2.89, respectively, and glucose level of 512. Sodium was 127. Bicarbonate was 22. Estimated GFR for non-African American would be 22. The patient's liver enzymes revealed elevation of AST to 49 and alkaline phosphatase of 118. Otherwise, comprehensive metabolic panel was unremarkable. CBC: White blood cell count was 10.2; hemoglobin was 14.4; platelet count was 119. The patient's urinalysis revealed amber turbid urine, more than 500 glucose, negative for bilirubin or ketones; specific gravity was 1.022; pH was 5.0, 3+ blood, more than 500 protein, negative for nitrites, 2+ leukocyte esterase, 630; red blood cells, 4860 white blood cells were noted, as well as no bacteria or epithelial cells, but white  blood cell clumps were present. Venous blood showed pH of 7.28, and pCO2 was 47.   The patient was admitted to the hospital for further evaluation. He was rehydrated with IV  fluids. Initially, it was felt that the patient's acute renal failure could have been related to infection. However the patient's urine cultures grew only 10,000 of Candida albicans, 15,000 of coagulase-negative Staphylococcus, which was felt to be contamination. With rehydration, however, the patient's kidney function normalized. He was able to eat well as well as drink fluids. It was felt that the patient's dehydration was related to poorly controlled diabetes mellitus. The patient's hemoglobin A1c was checked and was found to be 13.3. The patient's diabetic medications were advanced, and lifestyle as well as dietary consultations were obtained. The patient's insulin was advanced to 45 units subcutaneously at bedtime from home dose of 35 units daily. It is recommended to follow the patient's blood glucose levels as outpatient and make decisions about advancement of those medications higher if needed. The patient was noted to be hyponatremic. While in the hospital, he was rehydrated and his sodium level normalized. His hyponatremia was related to dehydration as well as hyperglycemia. On the day of discharge, the patient's sodium level is normal, as well as his kidney function. He was noted also to be weak. He was evaluated by physical therapist, and home health physical therapy was recommended. The patient was doing well, however, developed alcohol withdrawal symptoms while he was in the hospital and had to be kept in the hospital for a few more days longer even though his kidney function improved due to alcohol withdrawal delirium. The patient underwent alcohol withdrawal, and he is being discharged home with the above-mentioned medications and followup. In regards to his chronic medical problems including vocal cord dysfunction, hepatitis C, hyperlipidemia, COPD, the patient is to continue his outpatient medications. He was counseled about tobacco as well as alcohol abuse.   On the day of discharge, the  patient's vital signs: Temperature was 97.8. Pulse was 64 to 70 beats per minute. Respiratory rate was 20, blood pressure 142/71. Saturation was 91% to 22% on room air at rest.   TIME SPENT: 40 minutes.   ____________________________ Theodoro Grist, MD rv:jcm D: 01/04/2014 16:36:29 ET T: 01/04/2014 18:32:08 ET JOB#: [id number]  cc: Theodoro Grist, MD, <Dictator> Guadalupe Maple, MD [place] MD ELECTRONICALLY SIGNED 01/17/2014 14:19

## 2015-04-02 NOTE — Consult Note (Signed)
Chief Complaint:   Subjective/Chief Complaint not as enthusiastic about PEG/feeding tube. now on [place] for O2 delivery.   VITAL SIGNS/ANCILLARY NOTES: **Vital Signs.:   05-Feb-13 12:05   Vital Signs Type POCT   Pulse Pulse 84   Respirations Respirations 26   Systolic BP Systolic BP 245   Diastolic BP (mmHg) Diastolic BP (mmHg) 92   Mean BP 112   Pulse Ox % Pulse Ox % 91   Pulse Ox Heart Rate 80   Nurse Fingerstick (mg/dL) FSBS (fasting range 65-99 mg/dL) 148   Comments/Interventions  Per Hyperglycemia Protocol ([facility])    13:45   Temperature Temperature (F) 96.7   Celsius 35.9   Temperature Source oral   Pulse Pulse 72   Respirations Respirations 16   Pulse Ox % Pulse Ox % 92   Pulse Ox Heart Rate 78   Brief Assessment:   Cardiac Regular    Respiratory clear BS    Gastrointestinal details normal Soft  Nontender  Nondistended  No masses palpable  Bowel sounds normal   Routine Chem:  05-Feb-13 04:51    Glucose, Serum 284   BUN 45   Creatinine (comp) 0.97   Sodium, Serum 142   Potassium, Serum 4.2   Chloride, Serum 98   CO2, Serum 33   Calcium (Total), Serum 8.6   Osmolality (calc) 305   eGFR (African American) >60   eGFR (Non-African American) >60   Anion Gap 11  Routine Hem:  05-Feb-13 04:51    WBC (CBC) 14.9  Routine Chem:  05-Feb-13 04:51    Magnesium, Serum 2.1   Phosphorus, Serum 2.7  Blood Glucose:  05-Feb-13 05:41    POCT Blood Glucose 300    09:06    POCT Blood Glucose 182    12:02    POCT Blood Glucose 148   Radiology Results: XRay:    03-Feb-13 09:57, Chest Portable Single View   Chest Portable Single View    REASON FOR EXAM:    follow up hypoxia and copd  COMMENTS:       PROCEDURE: DXR - DXR PORTABLE CHEST SINGLE VIEW  - Jan 12 2012  9:57AM     RESULT: Comparison: 01/05/2012    Findings:     Single portable AP chest radiograph is provided. There has been interval   removal of the endotracheal tube. There is a left-sided central venous    catheter. There is right lower lobe airspace disease concerning for   pneumonia. There is hazy left lower lobe airspace disease which may   represent atelectasis versus pneumonia. There is next trace right pleural   effusion. There is no pneumothorax. Normal cardiomediastinal silhouette.     The osseous structures are unremarkable.    IMPRESSION:     Right lower lobe airspace disease most concerning for pneumonia.    Hazy left lower lobe airspace disease which may represent atelectasis   versus infiltrates.    Recommend followup radiography to document complete resolution following   adequate medical therapy. If there is not complete resolution, then   recommend further evaluation with CT of the chest to exclude underlying   pathology.        Verified By: Jennette Banker, M.D., MD   Assessment/Plan:  Assessment/Plan:   Assessment 1) severe pharyngeal dysphagia, high risk for recurrent aspiration.  PEG/gastric placement of feeding tube would not give appreciable decrease the risk for aspiration as this is the primary consideration for the proceedure.  A surgical feeding jejunostomy  is a better modality in this regard.  Patietn currently not sure about accepting these options.  Luminal evaluation via barium swallow (risk of barium aspiration) or egd ( sedation risk with recent respiratory failure)  is recommended to complete esophageal evaluation.  Patient currently on tpn, this can serve as a bridge until further evaluation re lung mass is done to assist in prognosis information for the patient. Recommend surgical consult for possible feeding jejunostomy.  2) aspiration pneumonia/s/p respiratory failure and intubation for same.   3) new diagnosis hepatitis C.  Patient has a good PT and albumen levels on admission, without evidence of bone marrow supression.  Currently not a candidate for treatment.  4) active etoh abuse PTA.  long history of same.    Plan as above, case discussed with  Dr Tressia Miners, Dr Peter Congo. following.   Electronic Signatures: Loistine Simas (MD)  (Signed [phone] 18:13)  Authored: Chief Complaint, VITAL SIGNS/ANCILLARY NOTES, Brief Assessment, Lab Results, Radiology Results, Assessment/Plan   Last Updated: 05-Feb-13 18:13 by Loistine Simas (MD)

## 2015-04-02 NOTE — Consult Note (Signed)
PATIENT NAME:  Marvin Lewis, Marvin Lewis MR#:  [id number] DATE OF BIRTH:  1951-10-08  DATE OF CONSULTATION:  01/03/2012  REFERRING PHYSICIAN:   CONSULTING PHYSICIAN:  Marvin Come. Gittin, MD  HISTORY OF PRESENT ILLNESS:  Marvin Lewis is a 64 year old man who was admitted after requiring intubation for upper respiratory obstruction and respiratory distress and was also found to have a lung mass. Marvin Lewis had recent cough and respiratory infection and bronchitis and was given antibiotics. He felt improved and he saw Marvin Lewis with stridor. ENT exam did not disclose any obstructing of the pharynx, mass lesions although the patient has a prior vocal cord paralysis from old injury. The patient did have some diffuse oropharyngeal thrush. He required intubation for airway maintenance and was sedated and put on a ventilator. He had a rigid bronchoscopy, and in the right lung he had a mass that was biopsied, preliminary report of a small cell lung cancer. The patient currently in the ICU, identified to have anemia that is chronic from old charts, some mild hyponatremia and hypokalemia, and is on ventilator management, also being given Diflucan for thrush.   PAST MEDICAL HISTORY: Pneumonia with respiratory failure, chronic obstructive pulmonary disease, diabetes, hypertension, chronic anemia, tobacco abuse, focal cord paralysis secondary to old trauma, hyperlipidemia, hepatitis, and previous notes listed as hepatitis C, other place hepatitis B and hepatitis A. Also history of pancreatitis.   PAST SURGICAL HISTORY: Shoulder repair, hernia surgery, sigmoid colon resection for colovesicular fistula from diverticulitis, a laparoscopic cholecystectomy, prior history of DTs, hyperlipidemia.   SOCIAL HISTORY: Married, lives with his wife, smokes, and alcohol use as noted.   ALLERGIES: No known drug allergies.   SYSTEM REVIEW: Not obtainable as patient is intubated.   MEDICATIONS:  Long list of chronic  medications as per admission note. When discharged from the hospital May 2012 for recent pneumonia he went home on antibiotics plus Zetia 10 mg a day, Norvasc 10 mg a day, omeprazole 20 mg daily, Lantus 25 units at night, lisinopril 5 mg daily, aspirin 81 mg daily, Combivent 2 puffs every 6 hours, thiamine, folic acid, multivitamins,  Advair Diskus, metoprolol 12.5 mg daily.    PHYSICAL EXAMINATION:  GENERAL: The patient has pallor. He is intubated and sedated.     NECK: No mass in the neck.   LYMPH: No palpable lymph nodes in the neck, submandibular or axilla region.   ABDOMEN: Nontender, soft. No palpable mass or organomegaly. No rash or bruising.  Appears well nourished.   LABORATORIES: Minimal elevation of SGOT, slight elevation of creatinine, sodium is 130, potassium is 3.2. The hemoglobin is 10.4, platelets were normal, leukocytosis.   IMPRESSION AND PLAN: The patient had a lesion in the right lung visible through the rigid bronchoscopy. It was biopsied and preliminary report is small cell lung cancer. When he is recovered from his current problems and he can go through staging, then he should have confirmation pathology then an MRI of the brain and CT scan with contrast.  If creatinine allows,  the chest, abdomen, and pelvis and a bone scan or if creatinine does not normalize then would substitute a PET scan. Would then check the initial finding, the exact location with respect to bronchi and the distance from carina. Would anticipate hopefully the patient does not have diffuse disease and if he indeed has a localized or limited stage small cell cancer it would be appropriate for chemotherapy and radiation with VP-16 cisplatinum. Would also have thoracic surgery evaluation given  the very focal presentation. The patient has anemia and prior history of anemia. It would be elements of diabetes and chronic disease and mild azotemia, probably blood loss, direct effect of alcohol, and probably  gastrointestinal blood loss. Would check iron studies and if it has been a long interval consider GI evaluation for luminal exam. The patient has extensive thrush, which could be due to immunosuppression from alcohol and diabetes and the recent antibiotics can cause but  would also want to document HIV status and given the hepatitis history would go ahead and recheck hepatitis C.  Nothing else to be done acutely, staging studies would be delayed until the patient recovers from respiratory issues. Marvin Lewis has indicated that            the lesion was not obstructing, that his airway problems were due to oral pharynx edema and thrush and that weaning is expected to progress uneventfully without a need for urgent surgical or radiation oncology consultation.     ____________________________ Marvin Come Inez Pilgrim, MD rgg:vtd D: 01/03/2012 17:38:57 ET T: 01/04/2012 08:07:16 ET JOB#: [id number]  cc: Marvin Come. Inez Pilgrim, MD, <Dictator> [place] Schimke MD ELECTRONICALLY SIGNED 02/03/2012 14:01
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
## 2015-04-02 NOTE — Consult Note (Signed)
Brief Consult Note: Diagnosis: dysphagia, new diagnosis Hepatitis C.   Patient was seen by consultant.   Recommend further assessment or treatment.   Comments: Patient seen and examined, full consult to follow.  Patient admitted with respiratory failure, with history of chronic dyspahgia/aspiration.  Last egd about 1980 fo an upper gi bleed at that time.  No history of dilation as patient remembers, however chronic h/o dysphagia for ove 30 years.  More recently recurrent aspiration.  MBSS reviewed with SLP.  Obvious aspiration noted with prominant osteophytes with possible impairment of swallowing.  Overt "stepping" of the esophageal lumen caused by osteophytes.  Patietn currently with marked respiratory compromise making sedation for proceedure higher risk.  Will discuss with anesthesia, with formal consult.  Further recs to follow.  Electronic Signatures: Loistine Simas (MD)  (Signed 04-Feb-13 21:53)  Authored: Brief Consult Note   Last Updated: 04-Feb-13 21:53 by Loistine Simas (MD)
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
## 2015-04-02 NOTE — Consult Note (Signed)
PATIENT NAME:  Marvin Lewis, Marvin Lewis MR#:  [id number] DATE OF BIRTH:  July 11, 1951  DATE OF CONSULTATION:  01/03/2012  REFERRING PHYSICIAN:  Janalee Dane, MD, Attending  CONSULTING PHYSICIAN:  Demetrios Loll, MD PRIMARY CARE PHYSICIAN: Dion Body, MD   REASON FOR CONSULTATION: Medical management.   HISTORY OF PRESENT ILLNESS: The patient is a 64 year old Caucasian male with multiple medical problems, including hypertension, diabetes, chronic obstructive pulmonary disease, pneumonia, anemia, vocal paralysis, alcohol abuse, hepatitis C, was admitted for acute upper airway obstruction, now status post a right middle and lower lobe mass removal by Dr. Carlis Abbott. A tissue sample was sent to pathology, preliminary diagnosis of a possible small cell cancer.The patient is on ventilator after intubation Dr. Carlis Abbott requests medical management.   PAST MEDICAL HISTORY: As mentioned above.   SOCIAL HISTORY: According to documents, the patient is a smoker and also with alcohol abuse.   PAST SURGICAL HISTORY: Unknown.   FAMILY HISTORY: Unable to obtain due to patient's intubation status.   REVIEW OF SYSTEMS: Unable to obtain due to intubation status.   MEDICATIONS/ALLERGIES: Reviewed.   PHYSICAL EXAMINATION:  VITAL SIGNS: Temperature 99.3, blood pressure 99/55, pulse 74, respirations 14, oxygen saturation 100%.   GENERAL: The patient is intubated on vent, in no acute distress.   HEENT: Pupils are round, equal, and reactive to light. Unable to examine oropharynx, nose.   NECK: Supple. No JVD or carotid bruits. No lymphadenopathy. No thyromegaly.  CARDIOVASCULAR: S1, S2 regular rate and rhythm. No murmurs or gallops.   LUNGS: Bilateral air entry. No wheezing or rales.   ABDOMEN: Soft, no distention, no obvious organomegaly. Bowel sounds are present.   EXTREMITIES: No edema, clubbing, or cyanosis.   SKIN: No rash or jaundice.   NEUROLOGICAL: Unable to examine at this time.   LABORATORY,  DIAGNOSTIC AND RADIOLOGICAL DATA:  ABG: pH 7.31, pCO2 63, pO2 75, with FIO2 60%.  Chest x-ray showed interval placement of endotracheal tube, improved aeration at the lung bases. Glucose 248, BUN 21, creatinine 1.5, sodium 130, potassium 3.2, chloride 90, bicarbonate 26. D-dimer 0.69.  WBC 12, hemoglobin 10.3, platelets 270.  Troponin 0.02. BNP 871.   IMPRESSION:  1. Respiratory failure.  2. Upper airway obstruction, status post bronchial mass removal.  3. Chronic obstructive pulmonary disease.  4. Diabetes, hyperglycemia.  5. Hypokalemia.  6. Hyponatremia.  7. Mild dehydration.  8. Anemia.  9. Hypertension  10. Alcohol abuse.  RECOMMENDATIONS:  1. For respiratory failure, continue ventilation, follow up with Dr. Mortimer Fries for vent management.  2. For chronic obstructive pulmonary disease, continue steroids and nebulizer.  3. For bronchial mass and possible small cell lung cancer, follow up with Oncology for possible chest CAT scan and a PET. In addition, the patient has elevated D-dimer, may need a CT angio to rule out PE.  The patient may need Lovenox 1 mg/kg subcutaneous every 12 hours until PE is ruled out. But since the patitent is just after mass remvoal procedure, I will defer it to Dr. Carlis Abbott.  4. For alcohol abuse, continue AWAS  protocol and continue sedation.  5. For diabetes and hyperglycemia, need to continue hyperglycemia protocol. Check hemoglobin A1c.  6. For hypokalemia and hyponatremia, the patient is on D5 normal saline with potassium, continue this same treatment. Follow-up BMP.   Thank you for the consult. We will follow up.   TIME SPENT: About 40 minutes.   ____________________________ Demetrios Loll, MD qc:cbb D: 01/03/2012 21:22:06 ET T: 01/04/2012 08:39:35 ET JOB#: [id number]  cc: Demetrios Loll, MD, <Dictator> Dion Body, MD Demetrios Loll MD ELECTRONICALLY SIGNED 01/04/2012 18:27

## 2015-04-02 NOTE — Consult Note (Signed)
PATIENT NAME:  Marvin Lewis, Marvin Lewis MR#:  [id number] DATE OF BIRTH:  01-Sep-1951  DATE OF CONSULTATION:  01/16/2012  REFERRING PHYSICIAN: Loistine Simas, MD CONSULTING PHYSICIAN:  Rochel Brome, MD / Celene Squibb. Parv Manthey, [place]  REASON FOR CONSULTATION: Dysphagia.   HISTORY OF PRESENT ILLNESS: This is a 64 year old male who was admitted emergently on 01/03/2012 due to respiratory failure. The patient has a long-standing history of dysphagia. He reports that this problem has probably been going on for about 30 years. He has had approximately four episodes of aspiration pneumonia in the last five years requiring hospitalization. He was brought to the operating room emergently on admission and Dr. Nadeen Landau performed bronchoscopy and intubation. He found a mass in the right lung, which did turn out to be cancer. He also noted candidiasis in the larynx and pharynx. The patient has since been extubated and transferred to the floor. He has had speech evaluation and is recommended not to have anything by mouth, even pills. The patient reports that at home he rarely noticed difficulty swallowing. He was taking regular food and drink. He denies nausea or vomiting. No abdominal pain. He has been having bowel movements, reports one this morning. He has a history of multiple abdominal surgeries, listed below. He denies any reflux type symptoms, although he does take Prilosec. He reports some weight loss since being hospitalized, but not a significant amount.   Over the last 24 hours, the patient has had a drop in blood sugar to 26. When this happened last night, he became delirious and pulled out his central line from the left side of the neck. The patient does not recall the event. He has not had any similar episodes this hospitalization, although he has had similar episodes with past hospitalizations. He denies any recent alcohol intake.   PAST MEDICAL HISTORY:  1. Chronic obstructive pulmonary disease. 2. Vocal cord  dysfunction. 3. Dysphagia. 4. Recurrent pneumonia. 5. History of tobacco abuse. 6. History of alcohol abuse. 7. Hepatitis B. 8. Recent diagnosis of hepatitis C. 9. Type 2 diabetes, on insulin.  10. Hyperlipidemia.  11. Hypertension.   PAST SURGICAL HISTORY:  1. Inguinal hernia repair in 1955. 2. Right shoulder surgery.  3. Laparotomy for diverticular abscess with appendectomy at that time, in 1996.  4. Laparoscopic cholecystectomy in 2009.  5. Sigmoid colectomy for colovesical fistula in 2010.   MEDICATIONS:  1. Haldol 2 mg IV every four hours as needed.  2. Diflucan 200 mg IV daily.  3. Zyvox 600 mg IV every 12 hours.  4. Zantac 50 mg IV every 8 hours. 5. Hydralazine 10 mg IV every six hours for hypertension. 6. Ativan 1 to 2 mg IV every 1 hour as needed.  7. Morphine 2 mg IV every four hours as needed. 8. Sliding scale NovoLog insulin. 9. Nitroglycerin 0.4 mg every 5 minutes p.r.n. chest pain. 10. Lopressor 5 mg IV every six hours for hypertension. 11. Clonidine 0.2 mg patch weekly.  12. Dilaudid 1 to 2 mg IV as needed.  13. Nicotine patch and oral inhaler. 14. Insulin glargine 30 units subcutaneous twice a day. 15. Prednisone taper started 01/16/2012. 16. Albuterol SVN every four hours. 17. Pulmicort SVN every 12 hours.  DRUG ALLERGIES: No known drug allergies.  REVIEW OF SYSTEMS: CONSTITUTIONAL: The patient complains of hoarseness and cough. CARDIOPULMONARY: Denies chest pain or shortness of breath. GASTROINTESTINAL: As above. GENITOURINARY: No urinary difficulties. Otherwise feeling well.     PHYSICAL EXAMINATION:   GENERAL: Well-developed, well-nourished  Caucasian male in no acute distress.   VITAL SIGNS: Height 72 inches, weight 211 pounds, temperature 97.3, pulse 72, respirations 16, blood pressure 156/78, pulse oximetry 96% on 2 liters nasal cannula.   HEENT: Pupils equal, round, and reactive to light. No scleral icterus. Oropharynx clear with poor dentition.  There is an eschar on the upper lip just to the left.  NECK: Supple. No lymphadenopathy or thyromegaly.   CARDIOVASCULAR: Heart regular rate and rhythm. No murmurs.   LUNGS: Clear to auscultation bilaterally. No wheezing. Breathing comfortably.   ABDOMEN: Soft, nontender, and nondistended. There is a long vertical midline incision that is well healed. There is a small reducible umbilical hernia.   NEUROLOGIC: The patient is awake, alert, and oriented. Appropriate mood and affect.   EXTREMITIES: 2+ pulses in all four extremities. No lower extremity edema.   LABS/STUDIES: Blood glucose with significant drops over the last 24 hours, most recently was 27. The patient has been given dextrose solution. White blood cell count 16.3, hemoglobin 10.8, platelet count 88,000.   IMPRESSION:  1. Dysphagia with recurrent aspiration pneumonia. 2. New diagnosis of lung cancer and hepatitis C. 3. Umbilical hernia. 4. Hypoglycemia.   PLAN: The case has been discussed with Dr. Tamala Julian who will come by to see the patient this evening. The patient has good indication for gastrostomy tube placement. This would help prevent further episodes of aspiration. It is unclear at this point why he is having significant     hypoglycemia. This will need to be corrected before he can have surgery. The patient also has significantly low platelet count. We will need to recheck coagulation studies prior to surgery. ____________________________ Celene Squibb. Forrester Blando, [place] amc:slb D: 01/16/2012 17:00:45 ET T: 01/16/2012 17:39:02 ET JOB#: [id number]  cc: Celene Squibb. Theda Sers, [place], <Dictator> Evgenia Merriman M Zamiya Dillard PA ELECTRONICALLY SIGNED 01/20/2012 10:56
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
## 2015-04-02 NOTE — Consult Note (Signed)
Brief Consult Note: Diagnosis: acon chronic respiratory failure, RML mass, ? aspiration pneumonitis, ac renal failure, shock, hypovolemic likely,  hyponatremia, anemia.   Patient was seen by consultant.   Consult note dictated.   Recommend further assessment or treatment.   Orders entered.   Discussed with Attending MD.   Comments: 1. Ac on chr. respiratory failure, on vent now, planning bronchoscopy by DR Mortimer Fries for RML mass, weaning after that, stseroids, Ab, inhalers ordered. 2. ac renal failure, poss hypotension, anemia, ?ACEI related, urine output is low, advanced iVF, follow urine output , CR 3. shock, hypovolemic, ? septic, IVF for now, levophed, keeping MAP>65 4. hyponatremia, IVF 5. anemia, poss related to preocedure, some blood from ETT succioning, follow, gtransfuse prn 6. lung mass, biopsy results are pending, oncology is planning full work up 7. a. fib, resolved, in SR now, poss stress, hypoxia relatedd 8. elevated transaminases, ?due to elevated CK, get CK level now 9 DM, insulin dependent, resume lantus, advance as needed, pt is on steroids, will be more difficult to control, dc D5W, on insulin drip now. wean, if possible off Thanks for consult, will follow along.  Electronic Signatures: Theodoro Grist (MD)  (Signed 26-Jan-13 09:34)  Authored: Brief Consult Note   Last Updated: 26-Jan-13 09:34 by Theodoro Grist (MD)

## 2015-04-02 NOTE — Discharge Summary (Signed)
PATIENT NAME:  Marvin Lewis, Marvin Lewis MR#:  [id number] DATE OF BIRTH:  July 24, 1951  DATE OF ADMISSION:  01/03/2012 DATE OF DISCHARGE:  01/21/2012  PRIMARY CARE PHYSICIAN: Dr. Jeananne Rama   REASON FOR ADMISSION: Shortness of breath.   DISCHARGE DIAGNOSES:  1. Acute hypoxic respiratory failure.  2. Acute laryngeal bronchial Candidiasis.  3. Bilateral lower lobe infiltrates with bronchiectasis, suspected aspiration pneumonia. 4. Aspiration pneumonia with an interarytenoid cultures positive for MRSA and laryngeal Candidiasis.  5. Acute laryngeal Candidiasis. 6. MRSA pneumonia also with aspiration pneumonia. 7. Right endobronchial mass noted on rigid bronchoscopy with biopsy revealing carcinoid tumor.  8. Carcinoid tumor. Will need further work-up as an outpatient.  9. Altered mental status with acute delirium, resolved.  10. Chronic dysphagia and aspiration. The patient has failed speech and swallow evaluation and failed modified barium swallow study, now status post PEG tube insertion.  11. Acute renal failure with ATN and prerenal azotemia.  12. Septic shock.  13. Anemia of chronic disease and also with iron deficiency.  14. Thrombocytopenia of unclear etiology.  15. Type II diabetes mellitus which is uncontrolled. 16. Hypertension.  17. Intermittent hypotension during this hospitalization.  18. Hyponatremia, resolved.  19. Hypokalemia, resolved.  20. Hypomagnesemia.  21. Leukocytosis, resolved.  22. HCV. 23. History of alcohol abuse. 24. History of erosive gastropathy and duodenitis noted on EGD.  25. Mild malnutrition.   CONSULTS:  1. GI with Dr. Gustavo Lah  2. ENT with Dr. Nadeen Landau  3. Palliative Care with Dr. Ermalinda Memos  4. Oncology with Dr. Inez Pilgrim  5. Nephrology consultation   6. Care Management consultation  7. Surgery with Dr. Rochel Brome    DISCHARGE DISPOSITION: Home with home health through [facility].   DISCHARGE MEDICATIONS:  1. Lantus 20 units subcutaneously at  bedtime.  2. Catapres-TTS-2 one patch transdermally applied weekly.  3. Metoprolol 25 mg p.o. q.12 hours via PEG tube.  4. Omeprazole 40 mg via PEG tube b.i.d.  5. Percocet 10/325 one tab via PEG tube q.12 hours.  6. Albuterol MDI 1 to 2 puffs inhaled q.4 to 6 hours p.r.n.  7. DuoNebs one dose inhaled q.6 hours p.r.n.  8. Tylenol suppository 650 mg PR q.8 hours p.r.n.  9. Iron sulfate 325 mg via PEG tube b.i.d.  10. Prednisone taper via PEG tube as written on prescription.   DISCHARGE CONDITION: Improved, stable.   DISCHARGE ACTIVITY: As tolerated.   DISCHARGE DIET: Glucerna 1.5 cal, patient to use cans and use 360 mL/hr daily at 6:30 a.m., 10:30 a.m., 14:30, and 18:30 and flush with 50 mL of water before and after feedings.   DISCHARGE INSTRUCTIONS:  1. Take medications as prescribed. 2. Return to the Emergency Department for recurrence of symptoms.  FOLLOW-UP INSTRUCTIONS:  1. Follow-up with Dr. Jeananne Rama in one week. 2. Follow-up with [facility] in one week. The patient needs repeat CBC within one week. 3. Follow-up with Dr. Rochel Brome within one month. Dr. Tamala Julian recommends removing sutures at gastrostomy site one month after insertion. The patient will see Dr. Rochel Brome in the office in a month to do so and he will see Dr. Rochel Brome to change PEG tube in the office in six months as per Dr. Thompson Caul instructions.   PROCEDURES: Please refer to previously dictated interim discharge summary performed by Dr. Deanne Coffer from 01/18/2012 for pertinent list of diagnostic, radiologic, and laboratory data. Additional procedures are as follows:  1. PEG tube insertion by Dr. Rochel Brome 01/17/2012 assisted by Dr. Gustavo Lah. 2.  EGD 01/17/2012. Normal esophagus. Nonbleeding erosive gastropathy. Erosive duodenitis. Externally removable PEG tube was placed successfully.    ADDITIONAL LABS AS FOLLOWS: Renal function BUN 21, creatinine 1.25 on admission. BUN 40, creatinine 1.46 from 01/20/2012.  Serum glucose 171 on the day of discharge. Hemoglobin A1c was 9.3. LFTs normal on admission except for serum albumin slightly low at 3.2, AST 65, AST improved to 44 from 01/05/2012. Troponin was negative on admission, not cycled as patient denied having any chest pain.   CBC on admission WBC 12, hemoglobin 10.3, hematocrit 30.3, platelets 270. Platelet count feel to the lowest range of 58,000 from 01/19/2012; platelet count 60,000 from 01/20/2012. INR normal at 1.0 with normal PT from 01/12/2012.   Wound culture 01/03/2012 grew out Streptococcus parasanguinis, Streptococcus mitis, and Streptococcus ciferrii. Fungal culture Candida albicans and Candida dubliniensis from interarytenoid fluid. The patient also had growth of MRSA from interarytenoid culture as well as Strep parasanguinis, Strep mitis.  Path report 01/03/2012 from bronchoscopy: Right middle lobe bronchus neoplasm is present. Carcinoid tumor of the lung noted.  HCV RNA positive 01/04/2012. HIV screen was nonreactive 01/04/2012. Hep C RNA from 01/10/2012 positive with Hep C viral antibody from 01/18/2012 HCV log 10 was 6.348 log10 IU/mL. HCV quantitation was 2,226,390 IU/mL.   2-D echocardiogram from 01/12/2012 technically difficult study. LV systolic function normal. EF is greater than 55%. Aortic valve opens well. Mild TR. RV systolic pressure is elevated at 30 to 40 mmHg. No pericardial effusion was noted. The study was technically difficult.   Prealbumin is low at 17 12/12/2011. Magnesium 1.7 from 01/12/2012 and 2 from 01/15/2012. Iron panel with ferritin level of 26, iron sat 5, TIBC 402, serum iron 19. BUN 31,  creatinine 1.91 01/04/2012. BUN 39, creatinine 0.98 from 01/16/2012. Serum potassium low at 3.4 from 01/09/2012, normal at 4.2 from 01/14/2012, normal on 01/20/2012 at 4.4. Creatinine 1.91 on admission, 1.46 on the day of discharge. Serum sodium 130 on 01/03/2012 and 133 on 01/20/2012.   HIT antibodies were negative. SPEP was  negative. Hep B surface antigen was also negative. ANA was negative.   BRIEF HISTORY/HOSPITAL COURSE: The patient is a 64 year old male with past medical history of COPD, vocal cord dysfunction, pharyngeal dysphagia, alcohol and tobacco abuse, diabetes mellitus, HCV, chronic alcoholic pancreatitis, hyperlipidemia, DTs due to alcoholism, and diverticulitis who presented to the Emergency Department with complaints of acute respiratory distress, stridor, shortness of breath, progressive loss of airway and was emergently intubated and placed on mechanical ventilator. Please see dictated admission history and physical for pertinent details surrounding the onset of this hospitalization. Additionally, please see interim discharge summary dictated by Dr. Karsten Fells from 01/10/2012 and also by Dr. Royden Purl from 01/18/2012 for details surrounding the majority of the patient's hospitalization. This document serves to provide additional details from the four days when I was rounding on the patient from 01/19/2012 up until the day of 01/21/2012 which is the day of discharge. The patient's condition had not deteriorated while I was rounding on him and his condition has continued to improve.  1. In regards to his acute respiratory failure, he had been successfully extubated.  2. For his acute laryngeal candidiasis and MRSA infection, he had received appropriate antifungals and antibiotic therapy. He had received 14 days of IV Diflucan. Initial plan was to give him 14 days of MRSA coverage with antibiotics. He was on Zyvox initially and he received 10 days but this was stopped due to thrombocytopenia. Thrombocytopenia is of unclear etiology. The patient  will follow-up with Dr. Inez Pilgrim in the office in this regard. He had no indications for platelet transfusion. This could have been medication induced. It could also be secondary to underlying HCV. Despite discontinuing Zyvox, his platelet count has remained low but has very slowly  started to improve. Also, for his acute respiratory failure and significant upper airway swelling and inflammation, he was on IV steroids with Decadron and later switched to Solu-Medrol and steroids and the patient's condition has improved. His steroids have been tapered. He did undergo flexible laryngoscopy 01/05/2012 by Dr. Carlis Abbott revealing some decrease in swelling and direct laryngoscopy 01/09/2012 revealed continued improvement. He did have persistent right lower lobe infiltrate on chest x-ray and there was a concern for aspiration pneumonia for which the patient has completed appropriate antibiotic therapy. He was noted to have a right endobronchial mass and underwent bronchoscopic biopsy with pathology returning positive for carcinoid tumor. This will need to be further evaluated as an outpatient and he will likely require PET CT for further staging and also may require outpatient octreotide scan. He will need close outpatient follow-up with Dr. Inez Pilgrim in this regard and from there may require referral to thoracic surgeon but this can be arranged by Dr. Inez Pilgrim and this will be decided depending on results from PET scan and possible octreotide scan. Further work-up with possible PET CT as well as possible octreotide scan will help further guide treatment options and for this reason the patient will follow-up with Dr. Inez Pilgrim as an outpatient at the [facility]  3. Altered mental status due to acute delirium which could have been multifactorial in nature from the patient's respiratory failure versus steroid induced and with conservative measures and weaning of his steroids his mental status has returned to baseline and his delirium is resolved.  4. Chronic dysphagia and aspiration as well as aspiration pneumonia for which the patient was seen by speech and swallow therapy team and has failed bedside swallow evaluation and also failed modified barium swallow study. He was felt to be high aspiration risk and  it was felt that he would not be able to tolerate oral feeds and it was recommended that he undergo PEG tube insertion if he was interested. The patient was interested in PEG tube insertion and underwent this procedure on date mentioned above by Dr. Tamala Julian assisted by Dr. Gustavo Lah. There were no complications post PEG tube placement. He has been tolerating PEG tube feedings well. Prior to receiving PEG tube feedings, he was receiving TPN. Once PEG tube was placed, TPN was discontinued and he is receiving PEG tube feedings with Glucerna 1.5 cal as advised by dietitian. 5. Acute renal failure felt to be prerenal azotemia and also ATN from low blood pressure and possible septic shock. With IV fluids, IV antibiotics, and blood pressure support, his renal function has significantly improved. Nephrotoxins were avoided. He is nonoliguric. Renal ultrasound was benign. He will need to have his renal function closely monitored as an outpatient.  6. Anemia of chronic disease also with some iron deficiency. His hemoglobin has remained stable. He will receive iron supplementation via his PEG tube. Will need to have his hemoglobin and hematocrit closely monitored as an outpatient by Dr. Inez Pilgrim at the [facility].  7. Uncontrolled type II diabetes mellitus. Sugars were initially elevated likely due to steroids and then later he was noted to be hypoglycemic and required a short course of being on a dextrose infusion. Thereafter, sugars were slowly stabilized. He has been weaned  off D10 and later switched to D5. Thereafter, sugars started to rise again and for this he has been restarted on low dose Lantus at bedtime. He was also given sliding scale insulin during this hospitalization. Overall his diabetes could be better controlled as an outpatient given hemoglobin A1c of 9.3 and further diabetic management will be deferred to the patient's primary care physician. Hopefully as his steroids are being tapered his sugars will also  continue to improve. Also in terms of diabetic diet, he will receive Glucerna tube feeds via PEG tube.  8. Electrolyte abnormalities with hyponatremia, hypokalemia, and hypomagnesemia, all improved after replacement.  9. Leukocytosis, felt to be steroid induced. The patient has been afebrile. White blood cell count has normalized.  10. Thrombocytopenia, new diagnosis. The patient's platelet count was normal at the time of admission. Exact etiology of thrombocytopenia was unclear. The patient was followed by Hematology during this hospitalization and multiple studies were sent with serologies. Hep B surface antigen was negative. SPEP was negative. ANA was negative. HIT antibodies were negative. The patient is known to have HCV and viral load was also checked but he had HCV coming into the hospital and had normal platelet count. There was a question of whether Zyvox was leading to thrombocytopenia. He received Zyvox for 10 days. Even after discontinuing Zyvox, his platelet count continued to drop some. He had no active bleeding noted and, therefore, did not have any acute indications for platelet transfusion. Exact etiology of thrombocytopenia is unclear at this time. This will need to be closely monitored as an outpatient by Dr. Inez Pilgrim and he will need repeat platelet count within one week as an outpatient at the [facility].  11. Alcohol abuse. The patient appears to be motivated to quit and maintain sobriety. He was seen by Palliative Care and recommendations made for him to follow-up with AA and AA staff members were contacted and actually saw the patient in the hospital. The patient will need ongoing support as an outpatient with AA to help him abstain from alcohol in the future.  12. Erosive gastropathy and duodenitis noted on EGD. PPI was initially held given concerns for thrombocytopenia. However, after EGD was performed during PEG tube insertion erosive gastropathy and duodenitis were noted and Dr.  Rochel Brome has restarted the patient on PPI therapy. He is currently asymptomatic and did not have any indigestion or GERD-like symptoms or any heartburn, nausea, vomiting, or abdominal pain. He will continue PPI for now for erosive gastropathy and duodenitis. He was advised to avoid NSAIDs and also advised to avoid future use of alcohol.  13. The patient was seen by PT and initially recommendation was made to have him discharged to rehab facility given his generalized weakness. However, the patient continued to work with PT while in the hospital and gradually his strength has improved and he was able to walk 100 feet on his own without any assistance. His insurance would not pay for skilled nursing facility and, per PT re-evaluation, he was deemed appropriate and safe to have him discharged home with home health through [facility] which has been arranged for this patient. The patient and his wife were agreeable to this plan.   On 01/21/2012 the patient was hemodynamically stable and his respiratory failure had resolved and he had been successfully extubated and also been weaned off oxygen and felt well and was without any specific complaints other than wanting to go home. He was felt to be stable for discharge home with  home health with close outpatient follow-up to which the patient was agreeable.   TIME SPENT ON DISCHARGE: Greater than 30 minutes.   ____________________________ Romie Jumper, MD [id number] D: 01/26/2012 03:22:44 ET T: 01/27/2012 07:17:04 ET JOB#: [id number]  cc: Romie Jumper, MD, <Dictator> Simonne Come. Inez Pilgrim, MD Guadalupe Maple, MD J. Rochel Brome, MD [facility] Lilian Kapur MD ELECTRONICALLY SIGNED 01/31/2012 18:49

## 2015-04-02 NOTE — Consult Note (Signed)
Chief Complaint:   Subjective/Chief Complaint Sleeping now after sedation   VITAL SIGNS/ANCILLARY NOTES: **Vital Signs.:   31-Jan-13 20:00   Pulse Pulse 70   Respirations Respirations 24   Systolic BP Systolic BP 588   Diastolic BP (mmHg) Diastolic BP (mmHg) 94   Mean BP 112   Pulse Ox % Pulse Ox % 97   Pulse Ox Heart Rate 68   Brief Assessment:   Cardiac Regular    Respiratory normal resp effort    Gastrointestinal details normal Nondistended    Additional Physical Exam pallor  no edema   Routine Chem:  31-Jan-13 03:10    Glucose, Serum 135   BUN 35   Creatinine (comp) 0.90   Sodium, Serum 143   Potassium, Serum 3.4   Chloride, Serum 101   CO2, Serum 33   Calcium (Total), Serum 8.0   Osmolality (calc) 295   eGFR (African American) >60   eGFR (Non-African American) >60   Anion Gap 9  Routine Hem:  31-Jan-13 03:10    WBC (CBC) 8.0   RBC (CBC) 4.04   Hemoglobin (CBC) 10.3   Hematocrit (CBC) 33.0   Platelet Count (CBC) 264   MCV 82   MCH 25.5   MCHC 31.3   RDW 17.0   Neutrophil % 92.9   Lymphocyte % 3.6   Monocyte % 3.4   Eosinophil % 0.1   Basophil % 0.0   Neutrophil # 7.4   Lymphocyte # 0.3   Monocyte # 0.3   Eosinophil # 0.0   Basophil # 0.0  Routine Chem:  31-Jan-13 03:10    Magnesium, Serum 1.5  Lab:  31-Jan-13 11:03    pH (ABG) 7.46   PCO2 43   PO2 66   FiO2 40   Base Excess 6.0   HCO3 30.6   O2 Saturation 93.2   O2 Device 840   Specimen Type (ABG) ARTERIAL   Patient Temp (ABG) 37.0   PEEP 5.0   PSV 10  Routine Chem:  31-Jan-13 11:58    Potassium, Serum 4.5   Magnesium, Serum 2.2   Assessment/Plan:  Assessment/Plan:   Assessment ENDOBRONCHIAL MASS, CARCINOID, PRIMARY VS METASTATIC. CHRONIC ANEMIA, WITH FACTORS INCLUDE DM, ETOH ABUSE, POSSIBLE GI BLOOD LOSS, COPD, HEP C. LOOKS LIKE IDA NOW, LIKELY FROM ETOH CAUSING UGI LOSSES WOULD WOULD LIKE TO HAVE GI EVALUATION. THERE IS ABNORMAL LOW LEVEL POS SPEP OF 200MG, UPEP IS NEGATIVE.  PRIOR HX HAS INCLUDED HILAR AND LUNG CAVITARY MASS, PRESUMED INFECTIOUS, BACK IN 2010.  CURRENTLY RESP STATUS IMPROVED, BUT AS PER NURSING HAD REMAINED CONFUSED TODAY    Plan F/U CBC.  WOULD CHECK HEP C VIRAL LOAD. WOULD CHECK HEP B SEROLOGIES. WOULD CHECK SERUM LIGHT CHAINS. WOULD CHECK AN SIEP.WOULD CONSULT GI RE HEP B AND IDA. APPRECIATE THORACIC SURGERY CONSULT. CASE DISCUSSED IN TUMOR CONFERENCE. WOULD PLAN FOR PET/CT, THEN LATER MAY NEED CT SCAN AND/OR OCTREOTIDE SCAN AS OUT PATIENT.  ALSO IF FAILS TO REGAIN BASELINE MENTAL STATUS, THEN HEAD CT OR MRI   Electronic Signatures: Dallas Schimke (MD)  (Signed 31-Jan-13 23:29)  Authored: Chief Complaint, VITAL SIGNS/ANCILLARY NOTES, Brief Assessment, Lab Results, Assessment/Plan   Last Updated: 31-Jan-13 23:29 by Dallas Schimke (MD)

## 2015-04-02 NOTE — H&P (Signed)
PATIENT NAME:  Marvin Lewis, Marvin Lewis MR#:  [id number] DATE OF BIRTH:  1951-08-19  DATE OF ADMISSION:  01/03/2012  HISTORY OF PRESENT ILLNESS:  The patient is a 64 year old white male who presented to the emergency room this morning with acute respiratory distress, stridor, and very rapid progressive loss of airway. I was emergently consulted by Dr. Lenon Oms, and the patient was taken emergently to the operating room after flexible laryngoscopy in the emergency room demonstrated airway obstruction and primarily inspiratory stridor. The history was primarily obtained postoperatively from the patient's wife and sister-in-law.  The most recent problem started about a week ago when the patient started having progressive cough and shortness of breath. He presented to the urgent care clinic where he was presumed to have a mild aspiration pneumonia, was placed on antibiotics, and progressively did not respond well to that treatment and once again presented to the emergency room in acute respiratory distress this morning.    Of note, the patient has a long history of aspiration pneumonia dating back at least 2 years ago. He was most recently hospitalized in May of 2012 with aspiration pneumonia. By the family's report he has been followed over the past several years by Dr. Raul Del, but they are not sure as to whether or not he has been seen over the past 2 years.  About 2 years ago he was seen by Dr. Tami Ribas at [facility] for progressive swallowing dysfunction. He was subsequently referred to Dr. Mauri Pole and evaluated by Dr. Mauri Pole, once again, by the family's report with an "injection in the neck." They did not feel as though this was helpful for his swallowing dysfunction. He had a neck injury 30 years ago, had no surgery at that time, was put in a C-collar for a few weeks. About 2 years ago he had diverticulitis and had "one foot of his colon removed" by Dr. Rochel Brome. Over the past several months the  patient's sister-in-law who is family nurse practitioner felt as though he was "third spacing."  He was coughing more, retained fluid, and had some swelling of lips and face. He is a long-term smoker and fairly heavy drinker.   ALLERGIES: None.   MEDICATIONS:  1. Zetia 10 mg daily.   2. Norvasc 10 mg daily.   3. Omeprazole 20 mg daily.   4. Lantus 25 units at bedtime.   5. Lisinopril 5 mg daily.   6. Aspirin 81 mg daily.   7. Combivent 2 puffs every 6 hours.   8. Thiamine 100 mg daily.   9. Folic acid 1 mg daily.   10. Multivitamin 1 p.o. daily.   PAST MEDICAL HISTORY: Chronic obstructive pulmonary disease, respiratory failure secondary to pneumonia, insulin-dependent diabetes mellitus, hepatitis C, hypertension, history of chronic anemia, tobacco abuse, right vocal cord paresis or paralysis, hyperlipidemia.   PAST SURGICAL HISTORY: Partial colectomy, cholecystectomy.   PHYSICAL EXAMINATION:  GENERAL: At presentation in the emergency room severe inspiratory stridor, agitation, sitting up gasping for breath.   NECK: Trachea is midline.   HEENT: Oral cavity/oropharynx: Two superficial ulcerations in the right lateral tongue corresponding to lingually everted teeth in the lower jaw with old amalgam. Head and face: Several small scabs around the right upper and lower lip and left lower lip. A small scab inferolateral to the lateral canthus.   PROCEDURE: Flexible fiberoptic laryngoscopy reveals prolapse of extremely edematous supraglottic tissue and small whitish-yellow patches around especially the interarytenoid mucosa, difficult to ascertain vocal cord movement due to  edema. The entire larynx appears anteriorly displaced.   IMPRESSION: Acute respiratory distress secondary to multiple factors. At this point the patient's greatest need is for control of his airway.  I explained to the patient's wife that this would require emergent intubation and/or tracheostomy. Emergent bronchoscopy and             esophagoscopy would also be carried out as well as direct laryngoscopy to help ascertain the cause of his acute airway demise.  If abnormalities are found on evaluation, biopsies will be taken.  All this was explained in detail to the patient's wife.     ____________________________ J. Nadeen Landau, MD [id number] D: 01/03/2012 17:30:51 ET T: 01/04/2012 07:48:16 ET  Job entered as the incorrect work type JOB#: [id number]  cc: Janalee Dane, MD, <Dictator> [facility] Nicholos Johns MD ELECTRONICALLY SIGNED 01/22/2012 8:26

## 2015-04-02 NOTE — Op Note (Signed)
PATIENT NAME:  CIRE, CLUTE MR#:  [id number] DATE OF BIRTH:  1951/04/10  DATE OF PROCEDURE:  01/17/2012  PREOPERATIVE DIAGNOSIS: Dysphagia.   POSTOPERATIVE DIAGNOSIS: Dysphagia.   PROCEDURE: Percutaneous endoscopic gastrostomy.   SURGEON: Rochel Brome, MD  GASTROENTEROLOGIST: Loistine Simas, MD  ANESTHESIA: General, local.   INDICATIONS: This 64 year old male was admitted to the hospital in respiratory distress. His speech therapy evaluation was found to have high risk for pulmonary aspiration and insertion of feeding gastrostomy tube was recommended for nutritional support.   DESCRIPTION OF PROCEDURE: The patient was placed on his bed in the supine position with the head of the bed elevated 15 degrees. He was placed under anesthesia by the anesthesia staff and was monitored with pulse oximetry, intermittent blood pressure recordings, and cardiac monitor. Dr. Gustavo Lah began the procedure with the endoscopy, and he will create a separate note regarding his findings.   After initial survey, the abdominal wall was examined and could see the light of the endoscope shining through the abdominal wall into the left upper quadrant just below the coastal margin and lateral to the midline. I pressed in with a finger and could see the imprint of the finger with the video monitor of the gastric mucosa. Next, this area was prepared with alcohol and also prepared with Betadine and draped in a sterile manner. The skin was infiltrated with 1% Xylocaine. Next, four Brown-Mueller T-fasteners were inserted and traction was applied as they were held in place with cotton balls and crimps. Next, a transverse incision was made between the four T-fasteners. The incision was some 1 cm in length. A Seldinger needle was inserted into the stomach, a guidewire was inserted and the needle withdrawn. Next, the tract was serially dilated and ultimately inserted the 18-gauge magna port gastric feeding tube. The balloon  was inflated with 15 mL of water and traction was applied as the balloon was pulled up to the abdominal wall. The disk was advanced down to the site and noted the 4 cm mark just about a centimeter outside the skin. The disk was attached to the skin with 2-0 silk sutures placing four to secure it to the abdominal wall. Next, gauze dressings were applied and then the wound was dressed further with benzoin and 2 inch silk tape. That was secured to help prevent to the patient from pulling it out. The tube was attached to a bile drainage bag.   The patient tolerated this satisfactorily and then was moved to the Recovery Room for postoperative care. ____________________________ J. Rochel Brome, MD jws:slb D: 01/17/2012 13:14:54 ET T: 01/17/2012 13:22:29 ET JOB#: [id number]  cc: Loreli Dollar, MD, <Dictator>  Loreli Dollar MD ELECTRONICALLY SIGNED 01/18/2012 13:56

## 2015-04-02 NOTE — Consult Note (Signed)
Brief Consult Note: Diagnosis: RML carcinoid.   Patient was seen by consultant.   Consult note dictated.   Recommend further assessment or treatment.   Comments: Would recommend PET scan and PFTs once stable and as an outpatient.  Would also recommend a clinci visit with me in a week or two after discharge.  Electronic Signatures: Louis Matte (MD)  (Signed 31-Jan-13 14:10)  Authored: Brief Consult Note   Last Updated: 31-Jan-13 14:10 by Louis Matte (MD)

## 2015-04-02 NOTE — Consult Note (Signed)
General Aspect lack of IV access    Present Illness 64 yo white male admitted to CCU for acute upper airway obstruction, was seen in OR while patient was being prepped for Trach, Dr Carlis Abbott with ENT notified me of possible RML/RLL mass. Patient with Rigid bronchoscope in place ans was able to obtain tissue sample and prelim diagnosis of possibel small cell cancer made. Patient currenlty intubated and placed on Full vent support for post operative resp failure due to upper airway obstruction, COPD, R lung mass with h/o ETOH abuse.  Now intubated and sedated but lacks approriate IV access.  I am asked to evaluate.  Past Medical History: 1. Right lower lobe pneumonia.  2. Hypertension.  3. Dysphagia.  4. Chronic obstructive pulmonary disease exacerbation.  5. Vocal cord dysfunction.  6. Tobacco abuse.  7. Hypertension.   Home Medications:  Advair Diskus 250 mcg-50 mcg inhalation powder: 1 puff(s)  2 times a day , Active  Tylenol 325 mg oral tablet: 1 - 2 cap(s)  every 4-6 hours as needed  , Active  Lantus Solostar Pen 100 units/mL subcutaneous solution: 25 units subcutaneous once a day (at bedtime) , Active  omeprazole 20 mg oral delayed release capsule: 1 cap(s) orally once a day , Active  lisinopril 40 mg oral tablet: 1 tab(s) orally once a day, Active  amlodipine 5 mg oral tablet: 1 tab(s) orally once a day, Active  hydrochlorothiazide 25 mg oral tablet: 1 tab(s) orally once a day, Active  Levaquin 500 mg oral tablet: 1 tab(s) orally every 24 hours, Active   No Known Allergies:   Case History:   Family History Non-Contributory    Social History positive  tobacco, positive ETOH, negative Illicit drugs   Review of Systems:   ROS Pt not able to provide ROS   Physical Exam:   GEN WD, critically ill appearing    HEENT moist oral mucosa, poor dentition    NECK supple  No masses  trachea midline    RESP intubated and sedated    CARD regular rate  no JVD    ABD soft   nondistended    GU foley catheter in place  clear yellow urine draining    EXTR negative cyanosis/clubbing, negative edema    SKIN normal to palpation, No rashes    PSYCH sedated, intubated   Nursing/Ancillary Notes: **Vital Signs.:   25-Jan-13 15:00   Pulse Pulse 66   Respirations Respirations 15   Systolic BP Systolic BP 700   Diastolic BP (mmHg) Diastolic BP (mmHg) 61   Mean BP 76   Pulse Ox % Pulse Ox % 100   Oxygen Delivery Ventilator Assisted   Pulse Ox Heart Rate 66   Nurse Fingerstick (mg/dL) FSBS (fasting range 65-99 mg/dL) 150   Comments/Interventions  No Treatment Required   Cardiac:  25-Jan-13 08:53    CK, Total 378   CPK-MB, Serum 9.9  Routine Chem:  25-Jan-13 08:53    Glucose, Serum 248   BUN 21   Creatinine (comp) 1.25   Sodium, Serum 130   Potassium, Serum 3.2   Chloride, Serum 90   CO2, Serum 26   Calcium (Total), Serum 8.2  Hepatic:  25-Jan-13 08:53    Bilirubin, Total 0.5   Alkaline Phosphatase 74   SGPT (ALT) 59   SGOT (AST) 65   Total Protein, Serum 7.0   Albumin, Serum 3.2  Routine Chem:  25-Jan-13 08:53    Osmolality (calc) 272   eGFR (  African American) >60   eGFR (Non-African American) >60   Anion Gap 14  Routine Coag:  25-Jan-13 08:53    D-Dimer, Quantitative 0.69  Routine Hem:  25-Jan-13 08:53    WBC (CBC) 12.0   RBC (CBC) 3.75   Hemoglobin (CBC) 10.3   Hematocrit (CBC) 30.3   Platelet Count (CBC) 270   MCV 81   MCH 27.5   MCHC 33.9   RDW 16.2  Cardiac:  25-Jan-13 08:53    Troponin I 0.02  Routine Chem:  25-Jan-13 08:53    B-Type Natriuretic Peptide ([facility]) 871  Lab:  25-Jan-13 11:50    pH (ABG) 7.31   PCO2 63   PO2 75   FiO2 60   Base Excess 3.6   HCO3 31.7   O2 Saturation 93.5   O2 Device 840   Specimen Type (ABG) ARTERIAL   Patient Temp (ABG) 37.0   Vt 500   PEEP 5.0   Mechanical Rate 16   Radiology Results: XRay:    25-Jan-13 08:49, Chest Portable Single View   Chest Portable Single View    REASON  FOR EXAM:    SOB  COMMENTS:       PROCEDURE: DXR - DXR PORTABLE CHEST SINGLE VIEW  - Jan 03 2012  8:49AM     RESULT: Comparison is made to the previous exam dated April 06, 2011.   There is persistent increased density at the right lung base with   increasing density at the left lung base consistent with basilar   pneumonia. Bronchiectasis is not excluded. No significant effusion is   evident. Followup PA and lateral views would be very helpful if the   patient can tolerate it. The heart size appears normal. The lungs are   otherwise clear.    IMPRESSION:     Basilar infiltrate bilaterally. Correlate for aspiration or   bronchiectasis.    Thank you for the opportunity to contribute to the care of your patient.           Verified By: Sundra Aland, M.D., MD     Impression 1.  Lack of appropriate IV access        plan to insert a central line with ultrasound 2.  Lung carcinoma        plan per oncology 3.  Pneumonia with airway obstruction        continue iv ABx and intubation        pulmonary on consult 4.  ETOH abuse        DT prophylaxis    Plan level 3 consult   Electronic Signatures: Hortencia Pilar (MD)  (Signed 25-Jan-13 18:30)  Authored: General Aspect/Present Illness, Home Medications, Allergies, History and Physical Exam, Vital Signs, Labs, Radiology, Impression/Plan   Last Updated: 25-Jan-13 18:30 by Hortencia Pilar (MD)
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
## 2015-04-02 NOTE — Consult Note (Signed)
Chief Complaint:   Subjective/Chief Complaint patient now interested in pursuing a feeding tube. I discussed the differences between a PEG and a jejunostomy with patient and wife.  Denies abdominal pain or nausea.   VITAL SIGNS/ANCILLARY NOTES: **Vital Signs.:   06-Feb-13 17:58   Vital Signs Type Routine   Temperature Temperature (F) 97.9   Celsius 36.6   Temperature Source oral   Pulse Pulse 85   Pulse source per Dinamap   Respirations Respirations 20   Systolic BP Systolic BP 156   Diastolic BP (mmHg) Diastolic BP (mmHg) 74   Mean BP 105   BP Source Dinamap   Pulse Ox % Pulse Ox % 92   Pulse Ox Activity Level  At rest   Oxygen Delivery 2L   Brief Assessment:   Cardiac Regular    Respiratory clear BS    Gastrointestinal details normal Soft  Nontender  Nondistended  No masses palpable  Bowel sounds normal   Blood Glucose:  06-Feb-13 01:50    POCT Blood Glucose 225  Routine Chem:  06-Feb-13 05:31    Glucose, Serum 136   BUN 45   Creatinine (comp) 1.00   Sodium, Serum 139   Potassium, Serum 4.3   Chloride, Serum 99   CO2, Serum 30   Calcium (Total), Serum 8.7   Osmolality (calc) 291   eGFR (African American) >60   eGFR (Non-African American) >60   Anion Gap 10  Routine Hem:  06-Feb-13 05:31    WBC (CBC) 12.7   RBC (CBC) 4.17   Hemoglobin (CBC) 10.7   Hematocrit (CBC) 34.2   Platelet Count (CBC) 115   MCV 82   MCH 25.6   MCHC 31.3   RDW 18.0   Neutrophil % 92.7   Lymphocyte % 3.8   Monocyte % 3.5   Eosinophil % 0.0   Basophil % 0.0   Neutrophil # 11.8   Lymphocyte # 0.5   Monocyte # 0.4   Eosinophil # 0.0   Basophil # 0.0  Routine Chem:  06-Feb-13 05:31    Magnesium, Serum 2.0   Phosphorus, Serum 3.7  Blood Glucose:  06-Feb-13 06:23    POCT Blood Glucose 129    07:39    POCT Blood Glucose 109    16:02    POCT Blood Glucose 92   Assessment/Plan:  Assessment/Plan:   Assessment 1) history of severe pharyngeal dysmotility and recurrent  episodes of aspiration pneumonia. History of vocal cord paralysis.  2) multiple medical problems to include new diagnosis of hepatitis C, long term alcohol abuse copd,DM, history of diverticulitis, right endobronchial neoplasm/carcinoid, hyperlipidemia, remote h/o ivda and cocaine use.    Plan 1) In regards to a feeding tube to prevent aspiration, no feeding tube prevents this however a jejunal feeding tube is better for this purpose than a gastrostomy tube.  Will contact Dr Tamala Julian at patients preference for evaluation for surgically placed jejunostomy tube.   Electronic Signatures: Loistine Simas (MD)  (Signed 06-Feb-13 20:04)  Authored: Chief Complaint, VITAL SIGNS/ANCILLARY NOTES, Brief Assessment, Lab Results, Assessment/Plan   Last Updated: 06-Feb-13 20:04 by Loistine Simas (MD)
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
## 2015-04-02 NOTE — Op Note (Signed)
PATIENT NAME:  Marvin Lewis, Marvin Lewis MR#:  [id number] DATE OF BIRTH:  29-May-1951  DATE OF PROCEDURE:  01/03/2012  PREOPERATIVE DIAGNOSES:  1. Lack of intravenous access.  2. Respiratory obstruction with pulmonary failure.  3. Pneumonia.  4. Alcohol abuse.   POSTOPERATIVE DIAGNOSES: 1. Lack of intravenous access.  2. Respiratory obstruction with pulmonary failure.  3. Pneumonia.  4. Alcohol abuse.   PROCEDURE PERFORMED: Insertion left IJ triple lumen catheter with ultrasound guidance.   SURGEON: Katha Cabal, MD   INDICATIONS FOR PROCEDURE: The patient is in the Intensive Care Unit, and he is intubated, sedated and critically ill. He does not have adequate IV access. The risks and benefits of  insertion of central line were reviewed. Consent has been obtained.   DESCRIPTION OF PROCEDURE: The patient is positioned supine. The left neck is prepped and draped in sterile fashion. Ultrasound is placed in a sterile sleeve. Ultrasound is utilized secondary to a lack of appropriate landmarks to avoid vascular injury. Under direct ultrasound visualization, the jugular vein is identified. It is echolucent, homogeneous, and easily compressible, indicating patency. Image is recorded. One percent lidocaine is infiltrated into the soft tissues, and a Seldinger needle is inserted into the jugular vein under direct ultrasound visualization. A J-wire is advanced. Counterincision was created with a #11 blade scalpel. The dilator is passed over the wire, and a triple-lumen catheter is fed without difficulty. All three lumens aspirate and flush easily. The catheter is secured to the skin of the neck. A Biopatch is applied and a sterile dressing.      The patient tolerated the procedure well, and there are no immediate changes in his overall condition. Chest x-ray is pending.   ____________________________ Katha Cabal, MD ggs:cbb D: 01/03/2012 18:33:07 ET T: 01/04/2012 08:34:28  ET JOB#: [id number]  cc: Katha Cabal, MD, <Dictator> Katha Cabal MD ELECTRONICALLY SIGNED 02/03/2012 10:33

## 2015-04-02 NOTE — Consult Note (Signed)
Chief Complaint:   Subjective/Chief Complaint blood sugar more stable overnight.  no abd pain or nausea.   VITAL SIGNS/ANCILLARY NOTES: **Vital Signs.:   08-Feb-13 08:46   Vital Signs Type Routine   Temperature Temperature (F) 98.2   Celsius 36.7   Temperature Source oral   Pulse Pulse 80   Pulse source per Dinamap   Respirations Respirations 17   Systolic BP Systolic BP 494   Diastolic BP (mmHg) Diastolic BP (mmHg) 75   Mean BP 88   BP Source Dinamap   Pulse Ox % Pulse Ox % 97   Pulse Ox Activity Level  At rest   Oxygen Delivery 2L   Brief Assessment:   Cardiac Regular    Respiratory clear BS    Gastrointestinal details normal Soft  Nontender  Nondistended  No masses palpable  Bowel sounds normal   Blood Glucose:  08-Feb-13 03:52    POCT Blood Glucose 135  Routine Chem:  08-Feb-13 05:03    Glucose, Serum 119   BUN 31   Creatinine (comp) 1.08   Sodium, Serum 136   Potassium, Serum 4.0   Chloride, Serum 98   CO2, Serum 26   Calcium (Total), Serum 7.8  Hepatic:  08-Feb-13 05:03    Albumin, Serum 2.6  Routine Chem:  08-Feb-13 05:03    Osmolality (calc) 280   eGFR (African American) >60   eGFR (Non-African American) >60   Anion Gap 12  Routine Hem:  08-Feb-13 05:03    WBC (CBC) 10.1   RBC (CBC) 3.76   Hemoglobin (CBC) 9.7   Hematocrit (CBC) 30.5   Platelet Count (CBC) 65   MCV 81   MCH 25.8   MCHC 31.7   RDW 17.9   Neutrophil % 80.3   Lymphocyte % 13.0   Monocyte % 6.6   Eosinophil % 0.1   Basophil % 0.0   Neutrophil # 8.1   Lymphocyte # 1.3   Monocyte # 0.7   Eosinophil # 0.0   Basophil # 0.0  Routine Chem:  08-Feb-13 05:03    Magnesium, Serum 1.5   Phosphorus, Serum 3.0  Blood Glucose:  08-Feb-13 05:24    POCT Blood Glucose 135  Routine Hem:  08-Feb-13 09:34    WBC (CBC) 13.7   RBC (CBC) 4.02   Hemoglobin (CBC) 10.5   Hematocrit (CBC) 32.6   Platelet Count (CBC) 80   MCV 81   MCH 26.1   MCHC 32.2   RDW 16.4   Neutrophil % 84.7    Lymphocyte % 10.5   Monocyte % 4.4   Eosinophil % 0.1   Basophil % 0.3   Neutrophil # 11.7   Lymphocyte # 1.4   Monocyte # 0.6   Eosinophil # 0.0   Basophil # 0.0  Blood Glucose:  08-Feb-13 11:24    POCT Blood Glucose 61    11:57    POCT Blood Glucose 106   Assessment/Plan:  Assessment/Plan:   Assessment 1)  severe pharyngeal dysphagia with recurrent aspiration pneumonia 2) multiple medical problems as noted.    Plan 1) for EGD/peg today.  I have discussed the risks benefits and complications of egd to include not limited to bleeding infection perforation and sedation and he and family wish to proceed.  further recs to follow.   Electronic Signatures: Loistine Simas (MD)  (Signed 08-Feb-13 12:25)  Authored: Chief Complaint, VITAL SIGNS/ANCILLARY NOTES, Brief Assessment, Lab Results, Assessment/Plan   Last Updated: 08-Feb-13 12:25 by Loistine Simas (MD)

## 2015-04-02 NOTE — Op Note (Signed)
PATIENT NAME:  Marvin Lewis, Marvin Lewis MR#:  [id number] DATE OF BIRTH:  Aug 21, 1951  DATE OF PROCEDURE:  01/03/2012  PREOPERATIVE DIAGNOSIS:  Airway compromise with inspiratory stridor.   POSTOPERATIVE DIAGNOSIS:  1. Airway compromise with inspiratory stridor. 2. 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  3. Laryngopharyngeal candidiasis.   PROCEDURES:  1. Emergent control of airway with endotracheal intubation (8.0 endotracheal tube).  2. Rigid bronchoscopy with biopsy.  3. Rigid esophagoscopy with biopsy.   SURGEON: Janalee Dane, MD   DESCRIPTION OF PROCEDURE: The patient was placed in the supine position on the Operating Room table. Using the Glide laryngoscope, the larynx was elevated and a 6.5 endotracheal tube was placed through the vocal cords, which were edematous, and successful intubation was carried out and confirmed with return of CO2 and auscultation of breath sounds. After the airway had been secured, planning was carried out for evaluation of the upper aerodigestive tract. Decision was made to take the 6.5 endotracheal tube out and replace it with a rigid bronchoscope for evaluation of the trachea and bronchi. The rigid bronchoscope was successfully placed and evaluation was carried out. The trachea and left primary bronchus were normal. There was thick secretions in the right primary bronchus. These were suctioned out, and a mass was identified below the takeoff of the right upper lobe. This mass was friable and bled easily. At this point, I called Dr. Mortimer Fries from the Critical Care Unit to get his perspective on the mass as well as his expertise in endobronchial lesions. He was kind enough to come to the Operating Room and agreed that this was most likely a neoplasm; and after discussion, a biopsy was taken with up-biting forceps and through-cutting forceps. This was sent for frozen section analysis and subsequently returned as neoplasm, neuroendocrine  features. Suction was used to control bleeding, and the rigid bronchoscope was removed, and an 8.0 endotracheal tube was placed. Once again, auscultation and confirmation with end tidal CO2 confirmed placement. At this point, the Dedo laryngoscope was used to examine the larynx, which was significantly anteriorly displaced, and the interarytenoid mucosa as well as the postcricoid mucosa was billowy, edematous, and small yellowish-white plaques were identified. These were sampled with a culture swab as well as a small biopsy was taken after esophagoscopy. Next, rigid esophagoscopy was carried out. The small yellowish-white plaques ended approximately 2 cm below the postcricoid region, and the remaining cervical esophagus was just erythematous and inflamed. No discrete mass or lesion was identified. At this point, a small biopsy was taken from the left postcricoid region for both histology and microbiology. The endotracheal tube was taped at 22 cm at the lower lip, and at this point the patient was returned to Anesthesia and the findings were related to the patient's family. Estimated blood loss was 5 mL.   ____________________________ J. Nadeen Landau, MD [id number] D: 01/03/2012 17:39:02 ET T: 01/04/2012 08:12:07 ET JOB#: [id number]  cc: Janalee Dane, MD, <Dictator> Nicholos Johns MD ELECTRONICALLY SIGNED 01/22/2012 8:26

## 2015-04-26 ENCOUNTER — Ambulatory Visit
Admission: RE | Admit: 2015-04-26 | Discharge: 2015-04-26 | Disposition: A | Discharge status: Home / Self Care | Payer: Medicare Other | Source: Ambulatory Visit | Attending: Specialist | Admitting: Specialist

## 2015-04-26 DIAGNOSIS — J9811 Atelectasis: Secondary | ICD-10-CM | POA: Insufficient documentation

## 2015-04-26 DIAGNOSIS — R222 Localized swelling, mass and lump, trunk: Secondary | ICD-10-CM | POA: Diagnosis present

## 2015-04-26 DIAGNOSIS — S2242XD Multiple fractures of ribs, left side, subsequent encounter for fracture with routine healing: Secondary | ICD-10-CM | POA: Diagnosis not present

## 2015-04-26 DIAGNOSIS — I2510 Atherosclerotic heart disease of native coronary artery without angina pectoris: Secondary | ICD-10-CM | POA: Diagnosis not present

## 2015-04-26 DIAGNOSIS — I712 Thoracic aortic aneurysm, without rupture: Secondary | ICD-10-CM | POA: Insufficient documentation

## 2015-04-26 DIAGNOSIS — R918 Other nonspecific abnormal finding of lung field: Secondary | ICD-10-CM

## 2015-04-27 ENCOUNTER — Ambulatory Visit
Admission: RE | Admit: 2015-04-27 | Discharge: 2015-04-27 | Disposition: A | Discharge status: Home / Self Care | Payer: Self-pay | Source: Ambulatory Visit | Attending: Specialist | Admitting: Specialist

## 2015-04-27 DIAGNOSIS — I639 Cerebral infarction, unspecified: Secondary | ICD-10-CM | POA: Diagnosis not present

## 2015-04-27 DIAGNOSIS — R0902 Hypoxemia: Secondary | ICD-10-CM | POA: Diagnosis not present

## 2015-04-27 DIAGNOSIS — G2401 Drug induced subacute dyskinesia: Secondary | ICD-10-CM | POA: Diagnosis not present

## 2015-04-27 DIAGNOSIS — G249 Dystonia, unspecified: Secondary | ICD-10-CM | POA: Diagnosis not present

## 2015-04-28 ENCOUNTER — Other Ambulatory Visit: Payer: Self-pay | Admitting: Neurology

## 2015-04-28 DIAGNOSIS — I6381 Other cerebral infarction due to occlusion or stenosis of small artery: Secondary | ICD-10-CM

## 2015-04-28 DIAGNOSIS — R0902 Hypoxemia: Secondary | ICD-10-CM

## 2015-04-28 DIAGNOSIS — G2401 Drug induced subacute dyskinesia: Secondary | ICD-10-CM

## 2015-04-28 DIAGNOSIS — I639 Cerebral infarction, unspecified: Secondary | ICD-10-CM

## 2015-05-03 DIAGNOSIS — G259 Extrapyramidal and movement disorder, unspecified: Secondary | ICD-10-CM | POA: Diagnosis not present

## 2015-05-03 DIAGNOSIS — K7460 Unspecified cirrhosis of liver: Secondary | ICD-10-CM | POA: Diagnosis not present

## 2015-05-03 DIAGNOSIS — R0902 Hypoxemia: Secondary | ICD-10-CM | POA: Diagnosis not present

## 2015-05-03 DIAGNOSIS — E1165 Type 2 diabetes mellitus with hyperglycemia: Secondary | ICD-10-CM | POA: Diagnosis not present

## 2015-05-03 DIAGNOSIS — I10 Essential (primary) hypertension: Secondary | ICD-10-CM | POA: Diagnosis not present

## 2015-05-05 ENCOUNTER — Ambulatory Visit
Admission: RE | Admit: 2015-05-05 | Discharge: 2015-05-05 | Disposition: A | Discharge status: Home / Self Care | Payer: Medicare Other | Source: Ambulatory Visit | Attending: Neurology | Admitting: Neurology

## 2015-05-05 DIAGNOSIS — R0902 Hypoxemia: Secondary | ICD-10-CM | POA: Diagnosis not present

## 2015-05-05 DIAGNOSIS — G2401 Drug induced subacute dyskinesia: Secondary | ICD-10-CM | POA: Insufficient documentation

## 2015-05-05 DIAGNOSIS — I638 Other cerebral infarction: Secondary | ICD-10-CM | POA: Diagnosis not present

## 2015-05-05 DIAGNOSIS — T887XXA Unspecified adverse effect of drug or medicament, initial encounter: Secondary | ICD-10-CM | POA: Insufficient documentation

## 2015-05-05 DIAGNOSIS — I6381 Other cerebral infarction due to occlusion or stenosis of small artery: Secondary | ICD-10-CM

## 2015-05-05 DIAGNOSIS — I639 Cerebral infarction, unspecified: Secondary | ICD-10-CM | POA: Diagnosis not present

## 2015-05-05 MED ORDER — GADOBENATE DIMEGLUMINE 529 MG/ML IV SOLN
20.0000 mL | Freq: Once | INTRAVENOUS | Status: AC | PRN
Start: 2015-05-05 — End: 2015-05-05
  Administered 2015-05-05: 20 mL via INTRAVENOUS

## 2015-05-09 DIAGNOSIS — J431 Panlobular emphysema: Secondary | ICD-10-CM | POA: Diagnosis not present

## 2015-05-09 DIAGNOSIS — R918 Other nonspecific abnormal finding of lung field: Secondary | ICD-10-CM | POA: Diagnosis not present

## 2015-05-09 DIAGNOSIS — I712 Thoracic aortic aneurysm, without rupture: Secondary | ICD-10-CM | POA: Diagnosis not present

## 2015-05-09 DIAGNOSIS — Z720 Tobacco use: Secondary | ICD-10-CM | POA: Diagnosis not present

## 2015-05-11 DIAGNOSIS — E785 Hyperlipidemia, unspecified: Secondary | ICD-10-CM | POA: Diagnosis not present

## 2015-05-11 DIAGNOSIS — F17200 Nicotine dependence, unspecified, uncomplicated: Secondary | ICD-10-CM | POA: Diagnosis not present

## 2015-05-11 DIAGNOSIS — I10 Essential (primary) hypertension: Secondary | ICD-10-CM | POA: Diagnosis not present

## 2015-05-11 DIAGNOSIS — J449 Chronic obstructive pulmonary disease, unspecified: Secondary | ICD-10-CM | POA: Diagnosis not present

## 2015-05-11 DIAGNOSIS — I716 Thoracoabdominal aortic aneurysm, without rupture: Secondary | ICD-10-CM | POA: Diagnosis not present

## 2015-05-15 ENCOUNTER — Other Ambulatory Visit: Payer: Self-pay | Admitting: Specialist

## 2015-05-15 DIAGNOSIS — R911 Solitary pulmonary nodule: Secondary | ICD-10-CM

## 2015-05-22 DIAGNOSIS — J441 Chronic obstructive pulmonary disease with (acute) exacerbation: Secondary | ICD-10-CM | POA: Diagnosis not present

## 2015-05-22 DIAGNOSIS — J449 Chronic obstructive pulmonary disease, unspecified: Secondary | ICD-10-CM | POA: Diagnosis not present

## 2015-05-22 DIAGNOSIS — R05 Cough: Secondary | ICD-10-CM | POA: Diagnosis not present

## 2015-05-24 ENCOUNTER — Encounter: Payer: Medicare Other | Admitting: Surgery

## 2015-05-26 ENCOUNTER — Inpatient Hospital Stay
Admission: EM | Admit: 2015-05-26 | Discharge: 2015-05-30 | DRG: 191 | Disposition: A | Discharge status: Home / Self Care | Payer: Medicare Other | Attending: Internal Medicine | Admitting: Internal Medicine

## 2015-05-26 ENCOUNTER — Emergency Department: Payer: Medicare Other

## 2015-05-26 ENCOUNTER — Encounter: Payer: Self-pay | Admitting: Emergency Medicine

## 2015-05-26 DIAGNOSIS — R05 Cough: Secondary | ICD-10-CM | POA: Diagnosis not present

## 2015-05-26 DIAGNOSIS — Z96649 Presence of unspecified artificial hip joint: Secondary | ICD-10-CM | POA: Diagnosis present

## 2015-05-26 DIAGNOSIS — R0602 Shortness of breath: Secondary | ICD-10-CM

## 2015-05-26 DIAGNOSIS — F17200 Nicotine dependence, unspecified, uncomplicated: Secondary | ICD-10-CM | POA: Diagnosis not present

## 2015-05-26 DIAGNOSIS — J3800 Paralysis of vocal cords and larynx, unspecified: Secondary | ICD-10-CM | POA: Diagnosis present

## 2015-05-26 DIAGNOSIS — J9811 Atelectasis: Secondary | ICD-10-CM | POA: Diagnosis present

## 2015-05-26 DIAGNOSIS — I10 Essential (primary) hypertension: Secondary | ICD-10-CM | POA: Diagnosis present

## 2015-05-26 DIAGNOSIS — I712 Thoracic aortic aneurysm, without rupture: Secondary | ICD-10-CM | POA: Diagnosis present

## 2015-05-26 DIAGNOSIS — J9809 Other diseases of bronchus, not elsewhere classified: Secondary | ICD-10-CM | POA: Diagnosis present

## 2015-05-26 DIAGNOSIS — Z930 Tracheostomy status: Secondary | ICD-10-CM

## 2015-05-26 DIAGNOSIS — Z8249 Family history of ischemic heart disease and other diseases of the circulatory system: Secondary | ICD-10-CM

## 2015-05-26 DIAGNOSIS — Z9981 Dependence on supplemental oxygen: Secondary | ICD-10-CM

## 2015-05-26 DIAGNOSIS — Z9889 Other specified postprocedural states: Secondary | ICD-10-CM

## 2015-05-26 DIAGNOSIS — J441 Chronic obstructive pulmonary disease with (acute) exacerbation: Secondary | ICD-10-CM | POA: Diagnosis not present

## 2015-05-26 DIAGNOSIS — S2242XD Multiple fractures of ribs, left side, subsequent encounter for fracture with routine healing: Secondary | ICD-10-CM | POA: Diagnosis not present

## 2015-05-26 DIAGNOSIS — Z9049 Acquired absence of other specified parts of digestive tract: Secondary | ICD-10-CM | POA: Diagnosis present

## 2015-05-26 DIAGNOSIS — C3490 Malignant neoplasm of unspecified part of unspecified bronchus or lung: Secondary | ICD-10-CM | POA: Diagnosis present

## 2015-05-26 DIAGNOSIS — Z716 Tobacco abuse counseling: Secondary | ICD-10-CM | POA: Diagnosis present

## 2015-05-26 DIAGNOSIS — K219 Gastro-esophageal reflux disease without esophagitis: Secondary | ICD-10-CM | POA: Diagnosis present

## 2015-05-26 DIAGNOSIS — J984 Other disorders of lung: Secondary | ICD-10-CM | POA: Diagnosis not present

## 2015-05-26 DIAGNOSIS — J9801 Acute bronchospasm: Secondary | ICD-10-CM | POA: Diagnosis present

## 2015-05-26 DIAGNOSIS — F17210 Nicotine dependence, cigarettes, uncomplicated: Secondary | ICD-10-CM | POA: Diagnosis present

## 2015-05-26 DIAGNOSIS — J439 Emphysema, unspecified: Secondary | ICD-10-CM | POA: Diagnosis not present

## 2015-05-26 DIAGNOSIS — Z720 Tobacco use: Secondary | ICD-10-CM | POA: Diagnosis not present

## 2015-05-26 DIAGNOSIS — Z794 Long term (current) use of insulin: Secondary | ICD-10-CM

## 2015-05-26 DIAGNOSIS — Z79899 Other long term (current) drug therapy: Secondary | ICD-10-CM | POA: Diagnosis not present

## 2015-05-26 DIAGNOSIS — E119 Type 2 diabetes mellitus without complications: Secondary | ICD-10-CM | POA: Diagnosis present

## 2015-05-26 DIAGNOSIS — R222 Localized swelling, mass and lump, trunk: Secondary | ICD-10-CM | POA: Diagnosis not present

## 2015-05-26 DIAGNOSIS — R918 Other nonspecific abnormal finding of lung field: Secondary | ICD-10-CM | POA: Diagnosis not present

## 2015-05-26 DIAGNOSIS — F329 Major depressive disorder, single episode, unspecified: Secondary | ICD-10-CM | POA: Diagnosis present

## 2015-05-26 DIAGNOSIS — R509 Fever, unspecified: Secondary | ICD-10-CM | POA: Diagnosis not present

## 2015-05-26 HISTORY — DX: Gastro-esophageal reflux disease without esophagitis: K21.9

## 2015-05-26 HISTORY — DX: Thoracic aortic aneurysm, without rupture: I71.2

## 2015-05-26 HISTORY — DX: Essential (primary) hypertension: I10

## 2015-05-26 HISTORY — DX: Chronic obstructive pulmonary disease, unspecified: J44.9

## 2015-05-26 HISTORY — DX: Depression, unspecified: F32.A

## 2015-05-26 HISTORY — DX: Type 2 diabetes mellitus without complications: E11.9

## 2015-05-26 HISTORY — DX: Aneurysm of the ascending aorta, without rupture: I71.21

## 2015-05-26 HISTORY — DX: Pneumonia, unspecified organism: J18.9

## 2015-05-26 HISTORY — DX: Major depressive disorder, single episode, unspecified: F32.9

## 2015-05-26 LAB — COMPREHENSIVE METABOLIC PANEL
ALK PHOS: 125 U/L (ref 38–126)
ALT: 35 U/L (ref 17–63)
ANION GAP: 10 (ref 5–15)
AST: 35 U/L (ref 15–41)
Albumin: 3.8 g/dL (ref 3.5–5.0)
BILIRUBIN TOTAL: 0.7 mg/dL (ref 0.3–1.2)
BUN: 20 mg/dL (ref 6–20)
CHLORIDE: 106 mmol/L (ref 101–111)
CO2: 24 mmol/L (ref 22–32)
Calcium: 9.1 mg/dL (ref 8.9–10.3)
Creatinine, Ser: 0.99 mg/dL (ref 0.61–1.24)
GFR calc Af Amer: >60 mL/min (ref >60)
GFR calc non Af Amer: >60 mL/min (ref >60)
GLUCOSE: 147 mg/dL — AB (ref 65–99)
POTASSIUM: 4.1 mmol/L (ref 3.5–5.1)
Sodium: 140 mmol/L (ref 135–145)
Total Protein: 7.3 g/dL (ref 6.5–8.1)

## 2015-05-26 LAB — CBC
HCT: 35.8 % — ABNORMAL LOW (ref 40.0–52.0)
HCT: 37 % — ABNORMAL LOW (ref 40.0–52.0)
HEMOGLOBIN: 11.4 g/dL — AB (ref 13.0–18.0)
Hemoglobin: 12 g/dL — ABNORMAL LOW (ref 13.0–18.0)
MCH: 27.6 pg (ref 26.0–34.0)
MCH: 28.3 pg (ref 26.0–34.0)
MCHC: 32 g/dL (ref 32.0–36.0)
MCHC: 32.4 g/dL (ref 32.0–36.0)
MCV: 86.4 fL (ref 80.0–100.0)
MCV: 87.4 fL (ref 80.0–100.0)
PLATELETS: 121 10*3/uL — AB (ref 150–440)
Platelets: 137 10*3/uL — ABNORMAL LOW (ref 150–440)
RBC: 4.14 MIL/uL — AB (ref 4.40–5.90)
RBC: 4.24 MIL/uL — ABNORMAL LOW (ref 4.40–5.90)
RDW: 15.6 % — ABNORMAL HIGH (ref 11.5–14.5)
RDW: 15.8 % — AB (ref 11.5–14.5)
WBC: 5.5 10*3/uL (ref 3.8–10.6)
WBC: 4.5 10*3/uL (ref 3.8–10.6)

## 2015-05-26 LAB — GLUCOSE, CAPILLARY
GLUCOSE-CAPILLARY: 208 mg/dL — AB (ref 65–99)
Glucose-Capillary: 158 mg/dL — ABNORMAL HIGH (ref 65–99)

## 2015-05-26 LAB — TROPONIN I

## 2015-05-26 MED ORDER — INSULIN ASPART 100 UNIT/ML ~~LOC~~ SOLN
4.0000 [IU] | Freq: Three times a day (TID) | SUBCUTANEOUS | Status: DC
  Administered 2015-05-26 – 2015-05-29 (×8): 4 [IU] via SUBCUTANEOUS
  Filled 2015-05-26 (×7): qty 4

## 2015-05-26 MED ORDER — ALBUTEROL SULFATE (2.5 MG/3ML) 0.083% IN NEBU: 2.5000 mg | INHALATION_SOLUTION | Freq: Once | RESPIRATORY_TRACT | Status: DC

## 2015-05-26 MED ORDER — CITALOPRAM HYDROBROMIDE 10 MG PO TABS
10.0000 mg | ORAL_TABLET | Freq: Every day | ORAL | Status: DC
  Administered 2015-05-27 – 2015-05-29 (×3): 10 mg via ORAL
  Filled 2015-05-26 (×4): qty 1

## 2015-05-26 MED ORDER — INSULIN GLARGINE 100 UNIT/ML ~~LOC~~ SOLN
45.0000 [IU] | Freq: Every day | SUBCUTANEOUS | Status: DC
  Administered 2015-05-26 – 2015-05-28 (×3): 45 [IU] via SUBCUTANEOUS
  Filled 2015-05-26 (×4): qty 0.45

## 2015-05-26 MED ORDER — METHYLPREDNISOLONE SODIUM SUCC 125 MG IJ SOLR
60.0000 mg | Freq: Four times a day (QID) | INTRAMUSCULAR | Status: DC
  Administered 2015-05-26 – 2015-05-27 (×3): 60 mg via INTRAVENOUS
  Filled 2015-05-26 (×3): qty 2

## 2015-05-26 MED ORDER — IPRATROPIUM-ALBUTEROL 0.5-2.5 (3) MG/3ML IN SOLN
3.0000 mL | Freq: Once | RESPIRATORY_TRACT | Status: AC
  Administered 2015-05-26: 3 mL via RESPIRATORY_TRACT

## 2015-05-26 MED ORDER — OXYCODONE HCL 5 MG PO TABS
10.0000 mg | ORAL_TABLET | ORAL | Status: DC | PRN
  Administered 2015-05-26 – 2015-05-28 (×9): 10 mg via ORAL
  Filled 2015-05-26 (×9): qty 2

## 2015-05-26 MED ORDER — INSULIN ASPART 100 UNIT/ML ~~LOC~~ SOLN
0.0000 [IU] | Freq: Three times a day (TID) | SUBCUTANEOUS | Status: DC
  Administered 2015-05-26: 4 [IU] via SUBCUTANEOUS
  Administered 2015-05-27: 08:00:00 7 [IU] via SUBCUTANEOUS
  Administered 2015-05-27: 17:00:00 11 [IU] via SUBCUTANEOUS
  Administered 2015-05-28: 3 [IU] via SUBCUTANEOUS
  Administered 2015-05-28: 12:00:00 4 [IU] via SUBCUTANEOUS
  Administered 2015-05-28: 17:00:00 7 [IU] via SUBCUTANEOUS
  Administered 2015-05-29: 17:00:00 4 [IU] via SUBCUTANEOUS
  Filled 2015-05-26: qty 4
  Filled 2015-05-26: qty 7
  Filled 2015-05-26 (×2): qty 4
  Filled 2015-05-26: qty 11
  Filled 2015-05-26: qty 4
  Filled 2015-05-26: qty 7
  Filled 2015-05-26: qty 3

## 2015-05-26 MED ORDER — OXYCODONE HCL 5 MG PO TABS
5.0000 mg | ORAL_TABLET | ORAL | Status: DC | PRN
  Administered 2015-05-26 – 2015-05-29 (×8): 5 mg via ORAL
  Filled 2015-05-26 (×7): qty 1

## 2015-05-26 MED ORDER — ENOXAPARIN SODIUM 40 MG/0.4ML ~~LOC~~ SOLN
40.0000 mg | SUBCUTANEOUS | Status: DC
  Administered 2015-05-26 – 2015-05-29 (×4): 40 mg via SUBCUTANEOUS
  Filled 2015-05-26 (×4): qty 0.4

## 2015-05-26 MED ORDER — IPRATROPIUM-ALBUTEROL 0.5-2.5 (3) MG/3ML IN SOLN
RESPIRATORY_TRACT | Status: AC
  Administered 2015-05-26: 3 mL via RESPIRATORY_TRACT
  Filled 2015-05-26: qty 3

## 2015-05-26 MED ORDER — IPRATROPIUM-ALBUTEROL 0.5-2.5 (3) MG/3ML IN SOLN
3.0000 mL | Freq: Four times a day (QID) | RESPIRATORY_TRACT | Status: DC
  Administered 2015-05-26 – 2015-05-27 (×4): 3 mL via RESPIRATORY_TRACT
  Filled 2015-05-26 (×4): qty 3

## 2015-05-26 MED ORDER — ALBUTEROL SULFATE (2.5 MG/3ML) 0.083% IN NEBU
INHALATION_SOLUTION | RESPIRATORY_TRACT | Status: AC
  Administered 2015-05-26: 2.5 mg via RESPIRATORY_TRACT
  Filled 2015-05-26: qty 3

## 2015-05-26 MED ORDER — ACETAMINOPHEN 650 MG RE SUPP: 650.0000 mg | Freq: Four times a day (QID) | RECTAL | Status: DC | PRN

## 2015-05-26 MED ORDER — METHYLPREDNISOLONE SODIUM SUCC 125 MG IJ SOLR
INTRAMUSCULAR | Status: AC
  Administered 2015-05-26: 125 mg via INTRAVENOUS
  Filled 2015-05-26: qty 2

## 2015-05-26 MED ORDER — DIPHENHYDRAMINE HCL 25 MG PO CAPS: 25.0000 mg | ORAL_CAPSULE | Freq: Every evening | ORAL | Status: DC | PRN

## 2015-05-26 MED ORDER — PANTOPRAZOLE SODIUM 40 MG PO TBEC
40.0000 mg | DELAYED_RELEASE_TABLET | Freq: Every day | ORAL | Status: DC
  Administered 2015-05-27 – 2015-05-29 (×3): 40 mg via ORAL
  Filled 2015-05-26 (×3): qty 1

## 2015-05-26 MED ORDER — ALBUTEROL SULFATE (2.5 MG/3ML) 0.083% IN NEBU
2.5000 mg | INHALATION_SOLUTION | Freq: Once | RESPIRATORY_TRACT | Status: AC
  Administered 2015-05-26: 2.5 mg via RESPIRATORY_TRACT

## 2015-05-26 MED ORDER — METOPROLOL TARTRATE 25 MG PO TABS
25.0000 mg | ORAL_TABLET | Freq: Two times a day (BID) | ORAL | Status: DC
  Administered 2015-05-26 – 2015-05-29 (×7): 25 mg via ORAL
  Filled 2015-05-26 (×8): qty 1

## 2015-05-26 MED ORDER — INSULIN ASPART 100 UNIT/ML ~~LOC~~ SOLN: 45.0000 [IU] | Freq: Every evening | SUBCUTANEOUS | Status: DC

## 2015-05-26 MED ORDER — INSULIN ASPART 100 UNIT/ML ~~LOC~~ SOLN
0.0000 [IU] | Freq: Every day | SUBCUTANEOUS | Status: DC
  Administered 2015-05-26: 2 [IU] via SUBCUTANEOUS
  Filled 2015-05-26: qty 2

## 2015-05-26 MED ORDER — ACETAMINOPHEN 325 MG PO TABS: 650.0000 mg | ORAL_TABLET | Freq: Four times a day (QID) | ORAL | Status: DC | PRN

## 2015-05-26 MED ORDER — BENAZEPRIL HCL 10 MG PO TABS
20.0000 mg | ORAL_TABLET | Freq: Every day | ORAL | Status: DC
  Administered 2015-05-27 – 2015-05-29 (×3): 20 mg via ORAL
  Filled 2015-05-26 (×4): qty 2

## 2015-05-26 MED ORDER — LEVOFLOXACIN IN D5W 500 MG/100ML IV SOLN
INTRAVENOUS | Status: AC
  Filled 2015-05-26: qty 100

## 2015-05-26 MED ORDER — SODIUM CHLORIDE 0.9 % IJ SOLN
3.0000 mL | Freq: Two times a day (BID) | INTRAMUSCULAR | Status: DC
  Administered 2015-05-26 – 2015-05-29 (×9): 3 mL via INTRAVENOUS

## 2015-05-26 MED ORDER — DOCUSATE SODIUM 100 MG PO CAPS
100.0000 mg | ORAL_CAPSULE | Freq: Two times a day (BID) | ORAL | Status: DC
  Administered 2015-05-26 – 2015-05-29 (×7): 100 mg via ORAL
  Filled 2015-05-26 (×8): qty 1

## 2015-05-26 MED ORDER — METHYLPREDNISOLONE SODIUM SUCC 125 MG IJ SOLR
125.0000 mg | Freq: Once | INTRAMUSCULAR | Status: AC
  Administered 2015-05-26: 125 mg via INTRAVENOUS

## 2015-05-26 MED ORDER — OXYCODONE HCL 5 MG PO TABS
ORAL_TABLET | ORAL | Status: AC
  Administered 2015-05-26: 5 mg via ORAL
  Filled 2015-05-26: qty 1

## 2015-05-26 MED ORDER — LEVOFLOXACIN IN D5W 500 MG/100ML IV SOLN
500.0000 mg | INTRAVENOUS | Status: DC
Start: 2015-05-26 — End: 2015-05-28
  Administered 2015-05-26 – 2015-05-27 (×2): 500 mg via INTRAVENOUS
  Filled 2015-05-26 (×3): qty 100

## 2015-05-26 MED ORDER — AMLODIPINE BESYLATE 10 MG PO TABS
10.0000 mg | ORAL_TABLET | Freq: Every day | ORAL | Status: DC
  Administered 2015-05-27 – 2015-05-29 (×3): 10 mg via ORAL
  Filled 2015-05-26 (×5): qty 1

## 2015-05-26 NOTE — Progress Notes (Signed)
Dr. Lavetta Nielsen notified of patients request for something to help him sleep. New order to be entered by Dr. Lavetta Nielsen for Benadryl.

## 2015-05-26 NOTE — Progress Notes (Signed)
Per call to Dr. Brigitte Pulse after failing to reach Dr Leslye Peer,  ok to apply ice pack to patient left upper arm infiltration

## 2015-05-26 NOTE — Plan of Care (Signed)
Problem: Discharge Progression Outcomes Goal: Dyspnea controlled Individualization Outcome: Progressing Pt from home lives with wife, history of stroke with some spastic upper and lower extremity movements, uses PRN o2 at 2L at night at home, uses a cane PRN for ambulation but has had falls recently, history of HTN and DM controlled by home meds

## 2015-05-26 NOTE — ED Provider Notes (Signed)
[facility] Emergency Department Provider Note  ____________________________________________  Time seen: 11 AM  I have reviewed the triage vital signs and the nursing notes.   HISTORY  Chief Complaint Cough; Nasal Congestion; and Fever    HPI Marvin Lewis is a 64 y.o. male with significant past medical history of COPD present with complaints of cough and worsening shortness of breath for 5 days. Patient has actually had respiratory failure in the past and has had a trach and a PEG tube both of which now been removed. He reports subjective fever at home. He is concerned he may have a pneumonia. He saw his doctor recently and was diagnosed with bronchitis and was put on antibiotics but does not feel any better. He reports mild chest tightness and cough which is productive with yellow sputum. He sees a pulmonologist     Past Medical History  Diagnosis Date  . Pneumonia   . Hypertension   . Diabetes mellitus without complication   . GERD (gastroesophageal reflux disease)   . Depression   . COPD (chronic obstructive pulmonary disease)     There are no active problems to display for this patient.   History reviewed. No pertinent past surgical history.  No current outpatient prescriptions on file.  Allergies Review of patient's allergies indicates no known allergies.  History reviewed. No pertinent family history.  Social History History  Substance Use Topics  . Smoking status: Current Every Day Smoker  . Smokeless tobacco: Not on file  . Alcohol Use: Yes    Review of Systems  Constitutional: Positive for fever Eyes: Negative for visual changes. ENT: Negative for sore throat Cardiovascular: Positive for chest tightness Respiratory: Positive for shortness of breath. Positive for cough Gastrointestinal: Negative for abdominal pain, vomiting and diarrhea. Genitourinary: Negative for dysuria. Musculoskeletal: Negative for back  pain. Skin: Negative for rash. Neurological: Negative for headaches or focal weakness   10-point ROS otherwise negative.  ____________________________________________   PHYSICAL EXAM:  VITAL SIGNS: ED Triage Vitals  Enc Vitals Group     BP 05/26/15 0945 134/66 mmHg     Pulse Rate 05/26/15 0945 60     Resp 05/26/15 0945 30     Temp 05/26/15 0945 97.7 F (36.5 C)     Temp Source 05/26/15 0945 Oral     SpO2 05/26/15 0945 91 %     Weight 05/26/15 0945 211 lb (95.709 kg)     Height 05/26/15 0945 '5\' 11"'$  (1.803 m)     Head Cir --      Peak Flow --      Pain Score 05/26/15 0954 7     Pain Loc --      Pain Edu? --      Excl. in [place]? --      Constitutional: Alert and oriented. Well appearing and in no distress. Eyes: Conjunctivae are normal. PERRL. ENT   Head: Normocephalic and atraumatic.   Nose: No rhinnorhea.   Mouth/Throat: Mucous membranes are moist. Cardiovascular: Normal rate, regular rhythm. Normal and symmetric distal pulses are present in all extremities. No murmurs, rubs, or gallops. Respiratory: Mild tachypnea. Use of accessory muscles. Decreased breath sounds in the right, wheezing on the left Gastrointestinal: Soft and non-tender in all quadrants. No distention. There is no CVA tenderness. Genitourinary: deferred Musculoskeletal: Nontender with normal range of motion in all extremities. No lower extremity tenderness nor edema. Neurologic:  Normal speech and language. No gross focal neurologic deficits are appreciated. Skin:  Skin  is warm, dry and intact. No rash noted. Psychiatric: Mood and affect are normal. Patient exhibits appropriate insight and judgment.  ____________________________________________    LABS (pertinent positives/negatives)  Labs Reviewed  CBC  COMPREHENSIVE METABOLIC PANEL  TROPONIN I    ____________________________________________   EKG  ED ECG REPORT I, Lavonia Drafts, the attending physician, personally viewed and  interpreted this ECG.  Date: 05/26/2015  Rate: 89 Rhythm: normal sinus rhythm QRS Axis: normal Intervals: normal ST/T Wave abnormalities: normal Conduction Disutrbances: none Narrative Interpretation: unremarkable   ____________________________________________    RADIOLOGY  Chest x-ray no acute distress, reviewed by me  CT shows no acute change, no pneumonia  ____________________________________________   PROCEDURES  Procedure(s) performed: none  Critical Care performed: none  ____________________________________________   INITIAL IMPRESSION / ASSESSMENT AND PLAN / ED COURSE  Pertinent labs & imaging results that were available during my care of the patient were reviewed by me and considered in my medical decision making (see chart for details).  Patient with mild tachypnea. Numerous comorbidities and a history of respiratory failure reports worsening short of breath. We will treat with nebulizers and steroid as we investigate for possible pneumonia  CT shows no acute change, however patient with increased work of breathing and requiring submental oxygen. We will admit to the hospitalist service ____________________________________________   FINAL CLINICAL IMPRESSION(S) / ED DIAGNOSES  Final diagnoses:  Shortness of breath  COPD exacerbation     Lavonia Drafts, MD 05/26/15 1459

## 2015-05-26 NOTE — Progress Notes (Signed)
Patient told me that a CT this year showed that he had a 4.4 ascending aortic aneurysm.   Reported patient comment to Dr Leslye Peer

## 2015-05-26 NOTE — Progress Notes (Signed)
Date: 05/26/2015,   MRN# [id number] Marvin Lewis 1951/09/16 Code Status:     Code Status Orders        Start     Ordered   05/26/15 1413  Full code   Continuous     05/26/15 1413    Advance Directive Documentation        Most Recent Value   Type of Advance Directive  Living will, Healthcare Power of Attorney   Pre-existing out of facility DNR order (yellow form or pink MOST form)     "MOST" Form in Place?       Hosp day:'@LENGTHOFSTAYDAYS'$ @ Referring MD: '@ATDPROV'$ @     PCP:      AdmissionWeight: 211 lb (95.709 kg)                 CurrentWeight: 209 lb 9.6 oz (95.074 kg)  CC: COPD exacerbation/abnormal chest ct scan  HPI: Came in with cough, wheezing and sob. There was also mild right chest pain. Chest ct was done 5/16. Overall right calcified infrahilar fullness was interpreted as  unchanged, right mdidle lobe atelectasis chronically present, there seem  to be a new calcified nodular density, eroding in the intermedius bronchus. He denied hemoptysis, chest pain, refractory wheezing . He has chronic cough. He also has vocal cord paralysis. S/p trach. Recommended bronchoscopy but he wass unwilling to do so at that time. Fearful he may end up on the vent and he want to be [place] his son's [place]. Surprisingly the repeat chest ct today showed the the infra hilar calcified mass has enlarged with extrinsic compression of the bronchus intermedius. See ct report below   PMHX:   Past Medical History  Diagnosis Date  . Pneumonia   . Hypertension   . Diabetes mellitus without complication   . GERD (gastroesophageal reflux disease)   . Depression   . COPD (chronic obstructive pulmonary disease)   . Ascending aortic aneurysm   . Ascending aortic aneurysm    Surgical Hx:  Past Surgical History  Procedure Laterality Date  . Peg placement N/A   . Peg tube removal N/A   . Tracheostomy N/A   . Cholecystectomy    . Pilonidal cyst excision N/A   . Hernia repair     Family Hx:   Family History  Problem Relation Age of Onset  . Coronary artery disease Mother   . Hypertension Mother   . Coronary artery disease Father   . Hypertension Father    Social Hx:   History  Substance Use Topics  . Smoking status: Current Every Day Smoker -- 0.25 packs/day for 40 years  . Smokeless tobacco: Not on file  . Alcohol Use: Yes   Medication:    Home Medication:  No current outpatient prescriptions on file.  Current Medication: '@CURMEDTAB'$ @   Allergies:  Review of patient's allergies indicates no known allergies.  Review of Systems: Gen:  Denies  fever, sweats, chills HEENT: Denies blurred vision, double vision, ear pain, eye pain, hearing loss, nose bleeds, sore throat Cvc:  No dizziness, chest pain or heaviness Resp:  Cough, raspy voice, wheezing better since being here  Gi: Denies swallowing difficulty, stomach pain, nausea or vomiting, diarrhea, constipation, bowel incontinence Gu:  Denies bladder incontinence, burning urine Ext:   No Joint pain, stiffness or swelling Skin: No skin rash, easy bruising or bleeding or hives Endoc:  No polyuria, polydipsia , polyphagia or weight change Psych: No depression, insomnia or hallucinations  Other:  All other  systems negative  Physical Examination:   VS: BP 151/70 mmHg  Pulse 78  Temp(Src) 98.3 F (36.8 C) (Oral)  Resp 20  Ht '5\' 11"'$  (1.803 m)  Wt 209 lb 9.6 oz (95.074 kg)  BMI 29.25 kg/m2  SpO2 94%  General Appearance: mild  Distress, wife at the bed side  Neuro: without focal findings, mental status, speech normal, alert and oriented, cranial nerves 2-12 intact, reflexes normal and symmetric, sensation grossly normal  HEENT: PERRLA, EOM intact, no ptosis, no other lesions noticed NECK: Old trach site present, no stridor Pulmonary:.rare wheezing, No rales  Coarse, decrease breath sounds at the right base   Cardiovascular:  Normal S1,S2.  No m/r/g.  Abdominal aorta pulsation normal.    Abdomen:Benign, Soft,  non-tender, No masses, hepatosplenomegaly, No lymphadenopathy Endoc: No evident thyromegaly, no signs of acromegaly or Cushing features Skin:   warm, no rashes, no ecchymosis  Extremities: normal, no cyanosis, clubbing, no edema, warm with normal capillary refill. Other findings:   Labs results:   Recent Labs     05/26/15  0951  05/26/15  1515  HGB  11.4*  12.0*  HCT  35.8*  37.0*  MCV  86.4  87.4  WBC  5.5  4.5  BUN  20   --   CREATININE  0.99   --   GLUCOSE  147*   --   CALCIUM  9.1   --   ,    Culture results:     Rad results:   Dg Chest 2 View  05/26/2015   CLINICAL DATA:  Cough.  Nasal congestion.  Fever.  Aortic aneurysm.  EXAM: CHEST  2 VIEW  COMPARISON:  04/26/2015  FINDINGS: Right infrahilar mass with associated right middle lobe chronic atelectasis. This mass was discussed in in the prior chest CT from 04/26/2015.  Mild scarring at the right lung apex. Known healing fractures of the left anterior sixth, seventh, eighth, and ninth ribs. Mildly prominent ascending aorta, similar to prior.  IMPRESSION: 1. Known right infrahilar mass, discussed on last months chest CT. This is likely causing the chronic right middle lobe atelectasis. 2. Known healing fractures the left anterior sixth through ninth ribs. 3. Mildly prominent ascending thoracic aorta, similar to prior.   Electronically Signed   By: Van Clines M.D.   On: 05/26/2015 11:26   Ct Chest Wo Contrast  05/26/2015   CLINICAL DATA:  Bronchitis.  Cough and right-sided chest pain.  EXAM: CT CHEST WITHOUT CONTRAST  TECHNIQUE: Multidetector CT imaging of the chest was performed following the standard protocol without IV contrast.  COMPARISON:  Multiple exams, including 04/26/2015  FINDINGS: Mediastinum/Nodes: Coronary, aortic arch, and branch vessel atherosclerotic vascular disease. Right lower paratracheal lymph node 1.2 cm in short axis, image 27 series 2 (formerly 0.9 cm).  Lungs/Pleura:  Again observed is a calcified  right infrahilar mass, 3.9 by 4.5 cm, causing prominent extrinsic compression of the bronchus intermedius and right lower lobe bronchi, and collapse of the right middle lobe bronchi. Intraluminal calcification in the bronchus intermedius noted, possibly reflecting erosion of the bronchial wall or invasion. There is considerable airway thickening and airway plugging in the right lower lobe distal to this mass, with some pain and faint peripheral reticulonodular opacities and tree-in-bud opacities suggesting atypical infectious bronchiolitis.  Right upper lobe scarring is present. Paraseptal emphysema. Subsegmental atelectasis anteriorly in the left lower lobe.  Upper abdomen: Cholecystectomy. Atherosclerosis of the upper abdominal aorta.  Musculoskeletal: Thoracic spondylosis. Late healing response of  left anterior rib fractures.  IMPRESSION: 1. Similar appearance the right infrahilar mass, which obstructs the right middle lobe bronchi, causes severe extrinsic compression of the bronchus intermedius, likely invades the bronchus intermedius, and which is associated with postobstructive airway thickening, airway plugging, and interstitial accentuation in the right lower lobe. Calcification in the right lower lobe bronchi/lower bronchus intermedius noted. Possible airway invasion by the mass. Correlate with prior tissue diagnosis. If no prior tissue diagnosis, biopsy would be recommended. This could be a bronchial carcinoid or conceivably some progressive granulomatous process. 2. Paraseptal emphysema. 3. Stable right apical scarring. Mild subsegmental atelectasis anteriorly in the left lower lobe. 4. Late healing response of left anterior rib fractures.   Electronically Signed   By: Van Clines M.D.   On: 05/26/2015 13:05     Assessment and Plan: 1. COPD. Stage III. Resisted Taking inhalers  2. Vocal cord paralysis with chronic raspy voice. 3. Status post tracheostomy and no signs of stridor or  significant stenosis. 4. Calcified Right infrahilar fullness, no change x 4 years, right middle lobe collapse(extrinsix compression) ? Granuloma, carcinoid or some other slow growing tumor. . Aware of the finding and the size is unchanged from 2010 to 5/16.  Repeat chest ct < one month shows increase in size.   5. Ascending 4.4 cm thoracic aneurysm  Plan   1. Agree with present copd regimen 2. Stay away from smoking  3. Out patient vascular regarding thoracic aneurysm 4. Recommended  bronchoscopy per above, pt refused, he wanted to wait until l november after his son's wedding. Today he is more incline to pursue work up of the mass  5. Consider pet scan on monday 6. Consult t- surg 7. ? Bronch vs resection     I have personally obtained a history, examined the patient, evaluated laboratory and imaging results, formulated the assessment and plan and placed orders.  The Patient requires high complexity decision making for assessment and support, frequent evaluation and titration of therapies, application of advanced monitoring technologies and extensive interpretation of multiple databases.   Herbon Fleming,M.D. Pulmonary & Critical care Medicine [facility]

## 2015-05-26 NOTE — ED Notes (Signed)
Pt to ed with c/o cough, congestion, fever since Monday.  Pt states he was seen at [facility] and was started on abx but states he is not feeling better and is concerned he has pneumonia.

## 2015-05-26 NOTE — Plan of Care (Addendum)
Problem: Discharge Progression Outcomes Goal: Dyspnea controlled Individualization Outcome: Progressing Patient had no c/o shortness of breath this shift Patient already uses home o2 prn  Patient is able to administer respiratory meds  Flu vaccine is not indicated  Patient already had pneumonia vaccine  Patient is a stand by assist to [place] , patient uses uses a cane at home  Patient expects to be discharged to home Patient pain is well controlled with prn meds Patient is tolerating diet well Patients vitals are stable

## 2015-05-26 NOTE — ED Notes (Signed)
Pt with CT.

## 2015-05-26 NOTE — H&P (Signed)
[facility] [place] NAME: Marvin Lewis    MR#:  [id number]  DATE OF BIRTH:  August 15, 1951  DATE OF ADMISSION:  05/26/2015  PRIMARY CARE PHYSICIAN: Enid Derry, MD   REQUESTING/REFERRING PHYSICIAN: Lavonia Drafts  CHIEF COMPLAINT:   Chief Complaint  Patient presents with  . Cough  . Nasal Congestion  . Fever    HISTORY OF PRESENT ILLNESS:  Marvin Lewis  is a 64 y.o. male with a known history of COPD, lung mass, diabetes and hypertension. He has been short of breath for over a week. He saw his PMD and they gave him antibiotics and a cough medication and he did not get any better. He feels like his chest is closing up. He feels like he is coughing. He is bringing up clear phlegm that had turned to yellow than to green and now clear again. He is wheezing. He has chest pain over the right side of his chest worse when he coughs, chest pain is with coughing and severe 10 out of 10 when he does cough.  PAST MEDICAL HISTORY:   Past Medical History  Diagnosis Date  . Pneumonia   . Hypertension   . Diabetes mellitus without complication   . GERD (gastroesophageal reflux disease)   . Depression   . COPD (chronic obstructive pulmonary disease)     PAST SURGICAL HISTORY:   Past Surgical History  Procedure Laterality Date  . Peg placement N/A   . Peg tube removal N/A   . Tracheostomy N/A   . Cholecystectomy    . Pilonidal cyst excision N/A   . Hernia repair      SOCIAL HISTORY:   History  Substance Use Topics  . Smoking status: Current Every Day Smoker -- 0.25 packs/day for 40 years  . Smokeless tobacco: Not on file  . Alcohol Use: Yes    FAMILY HISTORY:   Family History  Problem Relation Age of Onset  . Coronary artery disease Mother   . Hypertension Mother   . Coronary artery disease Father   . Hypertension Father     DRUG ALLERGIES:  No Known Allergies  REVIEW OF SYSTEMS:  CONSTITUTIONAL: Positive  for fever, positive for fatigue and weakness. Positive for chills and sweats. EYES: No blurred or double vision. Wears reading glasses EARS, NOSE, AND THROAT: No tinnitus or ear pain. Positive for sore throat. Positive for chronic dysphagia after neck injury. positive for runny nose. RESPIRATORY: Positive for cough with discolored phlegm, positive for shortness of breath and wheezing. No hemoptysis.  CARDIOVASCULAR: Positive for right sided chest pain.  GASTROINTESTINAL: No nausea, positive for vomiting the other day, no diarrhea or abdominal pain. No blood in bowel movements. History of constipation GENITOURINARY: No dysuria, hematuria.  ENDOCRINE: No polyuria, nocturia,  HEMATOLOGY: No anemia, easy bruising or bleeding SKIN: No rash or lesion. MUSCULOSKELETAL: No joint pain or arthritis.   NEUROLOGIC: No tingling, numbness, weakness.  PSYCHIATRY: No anxiety or depression.   MEDICATIONS AT HOME:   Prior to Admission medications   Medication Sig Start Date End Date Taking? Authorizing Provider  amLODipine (NORVASC) 10 MG tablet Take 1 tablet by mouth daily.   Yes Historical Provider, MD  benazepril (LOTENSIN) 20 MG tablet Take 1 tablet by mouth daily.   Yes Historical Provider, MD  citalopram (CELEXA) 10 MG tablet Take 10 mg by mouth daily.   Yes Historical Provider, MD  insulin aspart (NOVOLOG) 100 UNIT/ML FlexPen Inject 45 Units into  the skin every evening.   Yes Historical Provider, MD  ipratropium-albuterol (DUONEB) 0.5-2.5 (3) MG/3ML SOLN Take 3 mLs by nebulization every 6 (six) hours as needed.   Yes Historical Provider, MD  metoprolol tartrate (LOPRESSOR) 25 MG tablet Take 1 tablet by mouth 2 (two) times daily.   Yes Historical Provider, MD  omeprazole (PRILOSEC) 20 MG capsule Take 1 capsule by mouth daily.   Yes Historical Provider, MD      VITAL SIGNS:  Blood pressure 132/66, pulse 60, temperature 97.7 F (36.5 C), temperature source Oral, resp. rate 18, height '5\' 11"'$  (1.803  m), weight 95.709 kg (211 lb), SpO2 91 %.  PHYSICAL EXAMINATION:  GENERAL:  64 y.o.-year-old patient lying in the bed with no acute distress.  EYES: Pupils equal, round, reactive to light and accommodation. No scleral icterus. Extraocular muscles intact.  HEENT: Head atraumatic, normocephalic. Oropharynx and nasopharynx clear.  NECK:  Supple, no jugular venous distention. No thyroid enlargement, no tenderness.  LUNGS: Decreased breath sounds bilaterally, very difficult to hear any air entry in the right lower lobe. Positive for wheezing all lung fields, no rales,rhonchi or crepitation. No use of accessory muscles of respiration.  CARDIOVASCULAR: S1, S2 normal. No murmurs, rubs, or gallops.  ABDOMEN: Soft, nontender, nondistended. Bowel sounds present. No organomegaly or mass.  EXTREMITIES: Trace edema, no cyanosis on oxygen.  NEUROLOGIC: Cranial nerves II through XII are intact. Muscle strength 5/5 in all extremities. Sensation intact. Gait not checked.  PSYCHIATRIC: The patient is alert and oriented x 3.  SKIN: No rash, lesion, or ulcer.   LABORATORY PANEL:   CBC  Recent Labs Lab 05/26/15 0951  WBC 5.5  HGB 11.4*  HCT 35.8*  PLT 137*   Chemistries   Recent Labs Lab 05/26/15 0951  NA 140  K 4.1  CL 106  CO2 24  GLUCOSE 147*  BUN 20  CREATININE 0.99  CALCIUM 9.1  AST 35  ALT 35  ALKPHOS 125  BILITOT 0.7   Cardiac Enzymes  Recent Labs Lab 05/26/15 0951  TROPONINI <0.03    RADIOLOGY:  Dg Chest 2 View  05/26/2015   CLINICAL DATA:  Cough.  Nasal congestion.  Fever.  Aortic aneurysm.  EXAM: CHEST  2 VIEW  COMPARISON:  04/26/2015  FINDINGS: Right infrahilar mass with associated right middle lobe chronic atelectasis. This mass was discussed in in the prior chest CT from 04/26/2015.  Mild scarring at the right lung apex. Known healing fractures of the left anterior sixth, seventh, eighth, and ninth ribs. Mildly prominent ascending aorta, similar to prior.  IMPRESSION: 1.  Known right infrahilar mass, discussed on last months chest CT. This is likely causing the chronic right middle lobe atelectasis. 2. Known healing fractures the left anterior sixth through ninth ribs. 3. Mildly prominent ascending thoracic aorta, similar to prior.   Electronically Signed   By: Van Clines M.D.   On: 05/26/2015 11:26   Ct Chest Wo Contrast  05/26/2015   CLINICAL DATA:  Bronchitis.  Cough and right-sided chest pain.  EXAM: CT CHEST WITHOUT CONTRAST  TECHNIQUE: Multidetector CT imaging of the chest was performed following the standard protocol without IV contrast.  COMPARISON:  Multiple exams, including 04/26/2015  FINDINGS: Mediastinum/Nodes: Coronary, aortic arch, and branch vessel atherosclerotic vascular disease. Right lower paratracheal lymph node 1.2 cm in short axis, image 27 series 2 (formerly 0.9 cm).  Lungs/Pleura:  Again observed is a calcified right infrahilar mass, 3.9 by 4.5 cm, causing prominent extrinsic compression of the  bronchus intermedius and right lower lobe bronchi, and collapse of the right middle lobe bronchi. Intraluminal calcification in the bronchus intermedius noted, possibly reflecting erosion of the bronchial wall or invasion. There is considerable airway thickening and airway plugging in the right lower lobe distal to this mass, with some pain and faint peripheral reticulonodular opacities and tree-in-bud opacities suggesting atypical infectious bronchiolitis.  Right upper lobe scarring is present. Paraseptal emphysema. Subsegmental atelectasis anteriorly in the left lower lobe.  Upper abdomen: Cholecystectomy. Atherosclerosis of the upper abdominal aorta.  Musculoskeletal: Thoracic spondylosis. Late healing response of left anterior rib fractures.  IMPRESSION: 1. Similar appearance the right infrahilar mass, which obstructs the right middle lobe bronchi, causes severe extrinsic compression of the bronchus intermedius, likely invades the bronchus intermedius,  and which is associated with postobstructive airway thickening, airway plugging, and interstitial accentuation in the right lower lobe. Calcification in the right lower lobe bronchi/lower bronchus intermedius noted. Possible airway invasion by the mass. Correlate with prior tissue diagnosis. If no prior tissue diagnosis, biopsy would be recommended. This could be a bronchial carcinoid or conceivably some progressive granulomatous process. 2. Paraseptal emphysema. 3. Stable right apical scarring. Mild subsegmental atelectasis anteriorly in the left lower lobe. 4. Late healing response of left anterior rib fractures.   Electronically Signed   By: Van Clines M.D.   On: 05/26/2015 13:05    EKG:  Not ordered in the ER.  IMPRESSION AND PLAN:   1. COPD exacerbation. I will give high-dose IV Solu-Medrol 60 mg IV every 6 hours. I will continue nebulizer treatments. Empiric Levaquin. Oxygen if needed. 2. Lung mass with increasing size from recent CAT scan. I spoke with Dr. Raul Del who sees the patient as outpatient. The patient has known about this for while and wanted to follow with the CAT scans and deferred procedure previously. Now with increasing size I will get a pulmonology evaluation for further evaluation. Unfortunately Dr. Raul Del is not on call on the weekend and will have to go with the person on call. 3. Tobacco abuse smoking cessation counseling done 3 minutes by me. 4. Type 2 diabetes- sugars will be high on steroid-induced continue Lantus and we'll put on sliding scale also. 5. Essential hypertension continue usual medications. 6. Gastroesophageal reflux disease without esophagitis -patient on omeprazole as outpatient will have to use Protonix while here.  All the records are reviewed and case discussed with ED provider. Management plans discussed with the patient, family and they are in agreement.  CODE STATUS: Full code, does not want prolonged intubation.  TOTAL TIME TAKING CARE  OF THIS PATIENT: 55 minutes.    Loletha Grayer M.D on 05/26/2015 at 2:26 PM  Between 7am to 6pm - Pager - [phone]  After 6pm call admission pager [facility]  Office  [phone]  CC: Primary care physician; Enid Derry, MD

## 2015-05-27 LAB — BASIC METABOLIC PANEL
Anion gap: 9 (ref 5–15)
BUN: 23 mg/dL — AB (ref 6–20)
CO2: 22 mmol/L (ref 22–32)
Calcium: 9 mg/dL (ref 8.9–10.3)
Chloride: 104 mmol/L (ref 101–111)
Creatinine, Ser: 1.02 mg/dL (ref 0.61–1.24)
GFR calc Af Amer: >60 mL/min (ref >60)
GFR calc non Af Amer: >60 mL/min (ref >60)
GLUCOSE: 249 mg/dL — AB (ref 65–99)
POTASSIUM: 3.9 mmol/L (ref 3.5–5.1)
Sodium: 135 mmol/L (ref 135–145)

## 2015-05-27 LAB — CBC
HEMATOCRIT: 36 % — AB (ref 40.0–52.0)
Hemoglobin: 11.6 g/dL — ABNORMAL LOW (ref 13.0–18.0)
MCH: 28.1 pg (ref 26.0–34.0)
MCHC: 32.4 g/dL (ref 32.0–36.0)
MCV: 86.9 fL (ref 80.0–100.0)
PLATELETS: 141 10*3/uL — AB (ref 150–440)
RBC: 4.14 MIL/uL — AB (ref 4.40–5.90)
RDW: 15.5 % — ABNORMAL HIGH (ref 11.5–14.5)
WBC: 7.1 10*3/uL (ref 3.8–10.6)

## 2015-05-27 LAB — GLUCOSE, CAPILLARY
GLUCOSE-CAPILLARY: 231 mg/dL — AB (ref 65–99)
Glucose-Capillary: 153 mg/dL — ABNORMAL HIGH (ref 65–99)
Glucose-Capillary: 259 mg/dL — ABNORMAL HIGH (ref 65–99)
Glucose-Capillary: 165 mg/dL — ABNORMAL HIGH (ref 65–99)

## 2015-05-27 LAB — HEMOGLOBIN A1C: HEMOGLOBIN A1C: 6.1 % — AB (ref 4.0–6.0)

## 2015-05-27 MED ORDER — IPRATROPIUM-ALBUTEROL 0.5-2.5 (3) MG/3ML IN SOLN: 3.0000 mL | Freq: Four times a day (QID) | RESPIRATORY_TRACT | Status: DC | PRN

## 2015-05-27 MED ORDER — GUAIFENESIN-CODEINE 100-10 MG/5ML PO SOLN
10.0000 mL | ORAL | Status: DC | PRN
  Administered 2015-05-27 – 2015-05-29 (×7): 10 mL via ORAL
  Filled 2015-05-27 (×7): qty 10

## 2015-05-27 MED ORDER — PREDNISONE 50 MG PO TABS
50.0000 mg | ORAL_TABLET | Freq: Every day | ORAL | Status: DC
  Administered 2015-05-28 – 2015-05-29 (×2): 50 mg via ORAL
  Filled 2015-05-27 (×3): qty 1

## 2015-05-27 MED ORDER — NICOTINE 7 MG/24HR TD PT24
7.0000 mg | MEDICATED_PATCH | Freq: Every day | TRANSDERMAL | Status: DC
  Administered 2015-05-28 – 2015-05-29 (×2): 7 mg via TRANSDERMAL
  Filled 2015-05-27 (×2): qty 1

## 2015-05-27 MED ORDER — IPRATROPIUM-ALBUTEROL 0.5-2.5 (3) MG/3ML IN SOLN
3.0000 mL | Freq: Three times a day (TID) | RESPIRATORY_TRACT | Status: DC
  Administered 2015-05-28 – 2015-05-30 (×7): 3 mL via RESPIRATORY_TRACT
  Filled 2015-05-27 (×7): qty 3

## 2015-05-27 NOTE — Progress Notes (Signed)
[facility] [place] NAME: Marvin Lewis    MR#:  [id number]  DATE OF BIRTH:  04-16-1951  SUBJECTIVE:  CHIEF COMPLAINT:   Chief Complaint  Patient presents with  . Cough  . Nasal Congestion  . Fever   Much better, no SOB, walks fine.  REVIEW OF SYSTEMS:  CONSTITUTIONAL: Positive for fever, positive for fatigue and weakness. Positive for chills and sweats. EYES: No blurred or double vision. Wears reading glasses EARS, NOSE, AND THROAT: No tinnitus or ear pain. Positive for sore throat. Positive for chronic dysphagia after neck injury. positive for runny nose. RESPIRATORY: Positive for cough with discolored phlegm, positive for shortness of breath and wheezing. No hemoptysis.  CARDIOVASCULAR: Positive for right sided chest pain.  GASTROINTESTINAL: No nausea, positive for vomiting the other day, no diarrhea or abdominal pain. No blood in bowel movements. History of constipation GENITOURINARY: No dysuria, hematuria.  ENDOCRINE: No polyuria, nocturia,  HEMATOLOGY: No anemia, easy bruising or bleeding SKIN: No rash or lesion. MUSCULOSKELETAL: No joint pain or arthritis.  NEUROLOGIC: No tingling, numbness, weakness.  PSYCHIATRY: No anxiety or depression.   ROS  DRUG ALLERGIES:  No Known Allergies  VITALS:  Blood pressure 158/83, pulse 73, temperature 98.3 F (36.8 C), temperature source Oral, resp. rate 18, height '5\' 11"'$  (1.803 m), weight 95.074 kg (209 lb 9.6 oz), SpO2 93 %.  PHYSICAL EXAMINATION:  GENERAL: 64 y.o.-year-old patient lying in the bed with no acute distress.  EYES: Pupils equal, round, reactive to light and accommodation. No scleral icterus. Extraocular muscles intact.  HEENT: Head atraumatic, normocephalic. Oropharynx and nasopharynx clear.  NECK: Supple, no jugular venous distention. No thyroid enlargement, no tenderness.  LUNGS: Decreased breath sounds bilaterally, very difficult to hear any  air entry in the right lower lobe. minimal wheezing all lung fields, no rales,rhonchi or crepitation. No use of accessory muscles of respiration.  CARDIOVASCULAR: S1, S2 normal. No murmurs, rubs, or gallops.  ABDOMEN: Soft, nontender, nondistended. Bowel sounds present. No organomegaly or mass.  EXTREMITIES: Trace edema, no cyanosis on oxygen.  NEUROLOGIC: Cranial nerves II through XII are intact. Muscle strength 5/5 in all extremities. Sensation intact. Gait not checked.  PSYCHIATRIC: The patient is alert and oriented x 3.  SKIN: No rash, lesion, or ulcer.   Physical Exam LABORATORY PANEL:   CBC  Recent Labs Lab 05/27/15 0436  WBC 7.1  HGB 11.6*  HCT 36.0*  PLT 141*   ------------------------------------------------------------------------------------------------------------------  Chemistries   Recent Labs Lab 05/26/15 0951 05/27/15 0436  NA 140 135  K 4.1 3.9  CL 106 104  CO2 24 22  GLUCOSE 147* 249*  BUN 20 23*  CREATININE 0.99 1.02  CALCIUM 9.1 9.0  AST 35  --   ALT 35  --   ALKPHOS 125  --   BILITOT 0.7  --    ------------------------------------------------------------------------------------------------------------------  Cardiac Enzymes  Recent Labs Lab 05/26/15 0951  TROPONINI <0.03   ------------------------------------------------------------------------------------------------------------------  RADIOLOGY:  Dg Chest 2 View  05/26/2015   CLINICAL DATA:  Cough.  Nasal congestion.  Fever.  Aortic aneurysm.  EXAM: CHEST  2 VIEW  COMPARISON:  04/26/2015  FINDINGS: Right infrahilar mass with associated right middle lobe chronic atelectasis. This mass was discussed in in the prior chest CT from 04/26/2015.  Mild scarring at the right lung apex. Known healing fractures of the left anterior sixth, seventh, eighth, and ninth ribs. Mildly prominent ascending aorta, similar to prior.  IMPRESSION: 1. Known  right infrahilar mass, discussed on last months  chest CT. This is likely causing the chronic right middle lobe atelectasis. 2. Known healing fractures the left anterior sixth through ninth ribs. 3. Mildly prominent ascending thoracic aorta, similar to prior.   Electronically Signed   By: Van Clines M.D.   On: 05/26/2015 11:26   Ct Chest Wo Contrast  05/26/2015   CLINICAL DATA:  Bronchitis.  Cough and right-sided chest pain.  EXAM: CT CHEST WITHOUT CONTRAST  TECHNIQUE: Multidetector CT imaging of the chest was performed following the standard protocol without IV contrast.  COMPARISON:  Multiple exams, including 04/26/2015  FINDINGS: Mediastinum/Nodes: Coronary, aortic arch, and branch vessel atherosclerotic vascular disease. Right lower paratracheal lymph node 1.2 cm in short axis, image 27 series 2 (formerly 0.9 cm).  Lungs/Pleura:  Again observed is a calcified right infrahilar mass, 3.9 by 4.5 cm, causing prominent extrinsic compression of the bronchus intermedius and right lower lobe bronchi, and collapse of the right middle lobe bronchi. Intraluminal calcification in the bronchus intermedius noted, possibly reflecting erosion of the bronchial wall or invasion. There is considerable airway thickening and airway plugging in the right lower lobe distal to this mass, with some pain and faint peripheral reticulonodular opacities and tree-in-bud opacities suggesting atypical infectious bronchiolitis.  Right upper lobe scarring is present. Paraseptal emphysema. Subsegmental atelectasis anteriorly in the left lower lobe.  Upper abdomen: Cholecystectomy. Atherosclerosis of the upper abdominal aorta.  Musculoskeletal: Thoracic spondylosis. Late healing response of left anterior rib fractures.  IMPRESSION: 1. Similar appearance the right infrahilar mass, which obstructs the right middle lobe bronchi, causes severe extrinsic compression of the bronchus intermedius, likely invades the bronchus intermedius, and which is associated with postobstructive airway  thickening, airway plugging, and interstitial accentuation in the right lower lobe. Calcification in the right lower lobe bronchi/lower bronchus intermedius noted. Possible airway invasion by the mass. Correlate with prior tissue diagnosis. If no prior tissue diagnosis, biopsy would be recommended. This could be a bronchial carcinoid or conceivably some progressive granulomatous process. 2. Paraseptal emphysema. 3. Stable right apical scarring. Mild subsegmental atelectasis anteriorly in the left lower lobe. 4. Late healing response of left anterior rib fractures.   Electronically Signed   By: Van Clines M.D.   On: 05/26/2015 13:05    ASSESSMENT AND PLAN:   1. COPD exacerbation.  high-dose IV Solu-Medrol 60 mg IV every 6 hours. I will continue nebulizer treatments. Empiric Levaquin. Oxygen if needed.  will swich to oral steroids. 2. Lung mass with increasing size from recent CAT scan.   As per Dr. Raul Del- PET scan and then further planning.   As all the work ups can be done much faster as inpatient- pt agreed for that. 3. Tobacco abuse smoking cessation counseling done 3 minutes. 4. Type 2 diabetes-   continue Lantus and sliding scale also. 5. Essential hypertension continue usual medications. 6. Gastroesophageal reflux disease without esophagitis -patient on omeprazole as outpatient will have to use Protonix while here  All the records are reviewed and case discussed with Care Management/Social Workerr. Management plans discussed with the patient, family and they are in agreement.  CODE STATUS: full  TOTAL TIME TAKING CARE OF THIS PATIENT: 35 minutes.   More than 50% of the visit was spent in counseling/coordination of care  POSSIBLE D/C IN 2-3 DAYS, DEPENDING ON CLINICAL CONDITION.   Vaughan Basta M.D on 05/27/2015   Between 7am to 6pm - Pager - [phone]  After 6pm go to [email or web address] - password  [facility]  [facility]  Office   [phone]  CC: Primary care physician; Enid Derry, MD

## 2015-05-27 NOTE — Progress Notes (Signed)
Dr. Lavetta Nielsen notified of patient's c/o nonproductive cough. See new order entered by MD.

## 2015-05-27 NOTE — Plan of Care (Signed)
Problem: Discharge Progression Outcomes Goal: Other Discharge Outcomes/Goals Plan of care progress to goal:   Patient remains on 2L-O2. Nicotine patch ordered per patient request. Patient continues to complain of right sided chest/lung pain improved with PRN pain medications. No complaints of nausea/vomiting. Wife at bedside. Voiding in urinal at bedside with one person assist. PET Scan orders in place per Dr. Raul Del. IV Solumedrol discontinued; patient will receive first dose of Prednisone tomorrow morning.

## 2015-05-27 NOTE — Plan of Care (Signed)
Problem: Discharge Progression Outcomes Goal: Other Discharge Outcomes/Goals Outcome: Progressing Plan of Care progress to goals: Dyspnea-no c/o shortness of breath this shift. Home O2- Pt currently uses 2 Liters PRN at home.   Barriers-Pt is a stand by assist to use urinal or BR , patient uses uses a cane at home. Pt c/o insomnia, new order for Benadryl received, pt declined and said he doesn't like the way it makes him feel. Discharge plan-Pt expects to be discharged to home. Pain- c/o right sided chest/lung pain improved with PRN pain medications.  Diet- tolerating diet, no c/o nausea or vomiting.  Hemodynamically stable-VSS

## 2015-05-28 LAB — GLUCOSE, CAPILLARY
GLUCOSE-CAPILLARY: 175 mg/dL — AB (ref 65–99)
GLUCOSE-CAPILLARY: 195 mg/dL — AB (ref 65–99)
Glucose-Capillary: 124 mg/dL — ABNORMAL HIGH (ref 65–99)
Glucose-Capillary: 220 mg/dL — ABNORMAL HIGH (ref 65–99)

## 2015-05-28 MED ORDER — MORPHINE SULFATE 4 MG/ML IJ SOLN
4.0000 mg | INTRAMUSCULAR | Status: DC | PRN
  Administered 2015-05-28 – 2015-05-29 (×5): 4 mg via INTRAVENOUS
  Filled 2015-05-28 (×5): qty 1

## 2015-05-28 MED ORDER — LEVOFLOXACIN 500 MG PO TABS
500.0000 mg | ORAL_TABLET | Freq: Every day | ORAL | Status: DC
  Administered 2015-05-28 – 2015-05-29 (×2): 500 mg via ORAL
  Filled 2015-05-28 (×3): qty 1

## 2015-05-28 NOTE — Plan of Care (Signed)
Problem: Discharge Progression Outcomes Goal: Barriers To Progression Addressed/Resolved Outcome: Progressing INDIVIDUALIZATION: Pt from home lives with wife, history of stroke with some spastic upper and lower extremity movements, uses PRN o2 at 2L at night at home, uses a cane PRN for ambulation but has had falls recently, history of HTN and DM controlled by home meds

## 2015-05-28 NOTE — Plan of Care (Signed)
Problem: Discharge Progression Outcomes Goal: Other Discharge Outcomes/Goals Outcome: Progressing Plan of Care Progress to Goal:  Pt c/o R chest pain, worse when he coughs.  Pain medication only brings it down 1 point.  Got something stronger for severe pain.  Pt scheduled to have PET scan tomorrow.

## 2015-05-28 NOTE — Progress Notes (Signed)
[facility] [place] NAME: Marvin Lewis    MR#:  [id number]  DATE OF BIRTH:  December 17, 1950  SUBJECTIVE:  CHIEF COMPLAINT:   Chief Complaint  Patient presents with  . Cough  . Nasal Congestion  . Fever   Much better,  walks fine. Have right sided chest pain with cough.  REVIEW OF SYSTEMS:  CONSTITUTIONAL: Positive for fever, positive for fatigue and weakness. Positive for chills and sweats. EYES: No blurred or double vision. Wears reading glasses EARS, NOSE, AND THROAT: No tinnitus or ear pain. Positive for sore throat. Positive for chronic dysphagia after neck injury. positive for runny nose. RESPIRATORY: Positive for cough with discolored phlegm, positive for shortness of breath and wheezing. No hemoptysis.  CARDIOVASCULAR: Positive for right sided chest pain.  GASTROINTESTINAL: No nausea, positive for vomiting the other day, no diarrhea or abdominal pain. No blood in bowel movements. History of constipation GENITOURINARY: No dysuria, hematuria.  ENDOCRINE: No polyuria, nocturia,  HEMATOLOGY: No anemia, easy bruising or bleeding SKIN: No rash or lesion. MUSCULOSKELETAL: No joint pain or arthritis.  NEUROLOGIC: No tingling, numbness, weakness.  PSYCHIATRY: No anxiety or depression.   ROS  DRUG ALLERGIES:  No Known Allergies  VITALS:  Blood pressure 146/70, pulse 60, temperature 98.1 F (36.7 C), temperature source Oral, resp. rate 18, height '5\' 11"'$  (1.803 m), weight 95.074 kg (209 lb 9.6 oz), SpO2 97 %.  PHYSICAL EXAMINATION:  GENERAL: 64 y.o.-year-old patient lying in the bed with no acute distress.  EYES: Pupils equal, round, reactive to light and accommodation. No scleral icterus. Extraocular muscles intact.  HEENT: Head atraumatic, normocephalic. Oropharynx and nasopharynx clear.  NECK: Supple, no jugular venous distention. No thyroid enlargement, no tenderness.  LUNGS: Decreased breath sounds  bilaterally, very difficult to hear any air entry in the right lower lobe. minimal wheezing all lung fields, no rales,rhonchi or crepitation. No use of accessory muscles of respiration.  CARDIOVASCULAR: S1, S2 normal. No murmurs, rubs, or gallops.  ABDOMEN: Soft, nontender, nondistended. Bowel sounds present. No organomegaly or mass.  EXTREMITIES: Trace edema, no cyanosis on oxygen.  NEUROLOGIC: Cranial nerves II through XII are intact. Muscle strength 5/5 in all extremities. Sensation intact. Gait not checked.  PSYCHIATRIC: The patient is alert and oriented x 3.  SKIN: No rash, lesion, or ulcer.   Physical Exam LABORATORY PANEL:   CBC  Recent Labs Lab 05/27/15 0436  WBC 7.1  HGB 11.6*  HCT 36.0*  PLT 141*   ------------------------------------------------------------------------------------------------------------------  Chemistries   Recent Labs Lab 05/26/15 0951 05/27/15 0436  NA 140 135  K 4.1 3.9  CL 106 104  CO2 24 22  GLUCOSE 147* 249*  BUN 20 23*  CREATININE 0.99 1.02  CALCIUM 9.1 9.0  AST 35  --   ALT 35  --   ALKPHOS 125  --   BILITOT 0.7  --    ------------------------------------------------------------------------------------------------------------------  Cardiac Enzymes  Recent Labs Lab 05/26/15 0951  TROPONINI <0.03   ------------------------------------------------------------------------------------------------------------------  RADIOLOGY:  Dg Chest 2 View  05/26/2015   CLINICAL DATA:  Cough.  Nasal congestion.  Fever.  Aortic aneurysm.  EXAM: CHEST  2 VIEW  COMPARISON:  04/26/2015  FINDINGS: Right infrahilar mass with associated right middle lobe chronic atelectasis. This mass was discussed in in the prior chest CT from 04/26/2015.  Mild scarring at the right lung apex. Known healing fractures of the left anterior sixth, seventh, eighth, and ninth ribs. Mildly prominent ascending aorta, similar  to prior.  IMPRESSION: 1. Known right  infrahilar mass, discussed on last months chest CT. This is likely causing the chronic right middle lobe atelectasis. 2. Known healing fractures the left anterior sixth through ninth ribs. 3. Mildly prominent ascending thoracic aorta, similar to prior.   Electronically Signed   By: Van Clines M.D.   On: 05/26/2015 11:26   Ct Chest Wo Contrast  05/26/2015   CLINICAL DATA:  Bronchitis.  Cough and right-sided chest pain.  EXAM: CT CHEST WITHOUT CONTRAST  TECHNIQUE: Multidetector CT imaging of the chest was performed following the standard protocol without IV contrast.  COMPARISON:  Multiple exams, including 04/26/2015  FINDINGS: Mediastinum/Nodes: Coronary, aortic arch, and branch vessel atherosclerotic vascular disease. Right lower paratracheal lymph node 1.2 cm in short axis, image 27 series 2 (formerly 0.9 cm).  Lungs/Pleura:  Again observed is a calcified right infrahilar mass, 3.9 by 4.5 cm, causing prominent extrinsic compression of the bronchus intermedius and right lower lobe bronchi, and collapse of the right middle lobe bronchi. Intraluminal calcification in the bronchus intermedius noted, possibly reflecting erosion of the bronchial wall or invasion. There is considerable airway thickening and airway plugging in the right lower lobe distal to this mass, with some pain and faint peripheral reticulonodular opacities and tree-in-bud opacities suggesting atypical infectious bronchiolitis.  Right upper lobe scarring is present. Paraseptal emphysema. Subsegmental atelectasis anteriorly in the left lower lobe.  Upper abdomen: Cholecystectomy. Atherosclerosis of the upper abdominal aorta.  Musculoskeletal: Thoracic spondylosis. Late healing response of left anterior rib fractures.  IMPRESSION: 1. Similar appearance the right infrahilar mass, which obstructs the right middle lobe bronchi, causes severe extrinsic compression of the bronchus intermedius, likely invades the bronchus intermedius, and which is  associated with postobstructive airway thickening, airway plugging, and interstitial accentuation in the right lower lobe. Calcification in the right lower lobe bronchi/lower bronchus intermedius noted. Possible airway invasion by the mass. Correlate with prior tissue diagnosis. If no prior tissue diagnosis, biopsy would be recommended. This could be a bronchial carcinoid or conceivably some progressive granulomatous process. 2. Paraseptal emphysema. 3. Stable right apical scarring. Mild subsegmental atelectasis anteriorly in the left lower lobe. 4. Late healing response of left anterior rib fractures.   Electronically Signed   By: Van Clines M.D.   On: 05/26/2015 13:05    ASSESSMENT AND PLAN:   1. COPD exacerbation.  high-dose IV Solu-Medrol 60 mg IV every 6 hours. I will continue nebulizer treatments. Empiric Levaquin. Oxygen if needed.  will swich to oral steroids.requires supplemental oxygen. 2. Right Lung mass with increasing size from recent CAT scan.   As per Dr. Raul Del- PET scan and then further planning.   As all the work ups can be done much faster as inpatient- pt agreed for that.   Pt have more pain on right chest - added morphin IV for pain control. 3. Tobacco abuse smoking cessation counseling done 3 minutes. 4. Type 2 diabetes-   continue Lantus and sliding scale also. 5. Essential hypertension continue usual medications. 6. Gastroesophageal reflux disease without esophagitis -patient on omeprazole as outpatient will have to use Protonix while here  All the records are reviewed and case discussed with Care Management/Social Workerr. Management plans discussed with the patient, family and they are in agreement.  CODE STATUS: full  TOTAL TIME TAKING CARE OF THIS PATIENT: 35 minutes.   More than 50% of the visit was spent in counseling/coordination of care  POSSIBLE D/C IN 2-3 DAYS, DEPENDING ON CLINICAL CONDITION.  Vaughan Basta M.D on 05/28/2015    Between 7am to 6pm - Pager - [phone]  After 6pm go to [email or web address] - password [email or web address] [facility]  Office  [phone]  CC: Primary care physician; Enid Derry, MD

## 2015-05-28 NOTE — Plan of Care (Signed)
Problem: Discharge Progression Outcomes Goal: Other Discharge Outcomes/Goals Outcome: Progressing Plan of Care progress to goals: Dyspnea-no c/o shortness of breath this shift. Home O2- Pt currently uses 2 Liters PRN at home.    Barriers-Pt is a stand by assist to use urinal or BR , patient uses uses a cane at home. Pt c/o nonproductive cough, new order obtained for Guaifenisen with Codeine PRN q4hours, improvement noted.  Discharge plan-Pt expects to be discharged to home. Pain- c/o right sided chest/lung pain improved with PRN pain medications.   Diet- tolerating diet, no c/o nausea or vomiting.   Hemodynamically stable-VSS

## 2015-05-29 DIAGNOSIS — C3490 Malignant neoplasm of unspecified part of unspecified bronchus or lung: Secondary | ICD-10-CM | POA: Diagnosis present

## 2015-05-29 LAB — GLUCOSE, CAPILLARY
GLUCOSE-CAPILLARY: 120 mg/dL — AB (ref 65–99)
GLUCOSE-CAPILLARY: 160 mg/dL — AB (ref 65–99)
Glucose-Capillary: 63 mg/dL — ABNORMAL LOW (ref 65–99)
Glucose-Capillary: 76 mg/dL (ref 65–99)
Glucose-Capillary: 177 mg/dL — ABNORMAL HIGH (ref 65–99)

## 2015-05-29 MED ORDER — INSULIN GLARGINE 100 UNIT/ML ~~LOC~~ SOLN
40.0000 [IU] | Freq: Every day | SUBCUTANEOUS | Status: DC
  Administered 2015-05-29: 22:00:00 40 [IU] via SUBCUTANEOUS
  Filled 2015-05-29 (×2): qty 0.4

## 2015-05-29 MED ORDER — OXYCODONE HCL ER 10 MG PO T12A
20.0000 mg | EXTENDED_RELEASE_TABLET | Freq: Two times a day (BID) | ORAL | Status: DC
  Administered 2015-05-29 – 2015-05-30 (×3): 20 mg via ORAL
  Filled 2015-05-29 (×3): qty 2

## 2015-05-29 MED ORDER — PREDNISONE 20 MG PO TABS: 40.0000 mg | ORAL_TABLET | Freq: Every day | ORAL | Status: DC

## 2015-05-29 MED ORDER — BUDESONIDE-FORMOTEROL FUMARATE 160-4.5 MCG/ACT IN AERO: 2.0000 | INHALATION_SPRAY | Freq: Two times a day (BID) | RESPIRATORY_TRACT | Status: DC

## 2015-05-29 MED ORDER — OXYCODONE HCL ER 20 MG PO T12A
20.0000 mg | EXTENDED_RELEASE_TABLET | Freq: Two times a day (BID) | ORAL | Status: DC
Start: 2015-05-29 — End: 2015-08-09

## 2015-05-29 MED ORDER — OXYCODONE HCL 10 MG PO TABS: 10.0000 mg | ORAL_TABLET | ORAL | Status: DC | PRN

## 2015-05-29 MED ORDER — OXYCODONE HCL 5 MG PO TABS
10.0000 mg | ORAL_TABLET | ORAL | Status: DC | PRN
  Administered 2015-05-30: 12:00:00 10 mg via ORAL
  Filled 2015-05-29: qty 2

## 2015-05-29 MED ORDER — MORPHINE SULFATE 2 MG/ML IJ SOLN
2.0000 mg | Freq: Four times a day (QID) | INTRAMUSCULAR | Status: DC | PRN
  Administered 2015-05-30: 2 mg via INTRAVENOUS
  Filled 2015-05-29: qty 1

## 2015-05-29 MED ORDER — LEVOFLOXACIN 500 MG PO TABS: 500.0000 mg | ORAL_TABLET | Freq: Every day | ORAL | Status: DC

## 2015-05-29 NOTE — Plan of Care (Signed)
Problem: Discharge Progression Outcomes Goal: Other Discharge Outcomes/Goals Outcome: Progressing Pt plan to have PET scan today. Has a lung mass that is continuing to cause pain to the right chest. Requiring frequent medication. Remains on telemetry monitoring, did have a 13 beat run of SVT but was during a coughing spell. Is usually NSR with 1st degree heart block with PVCs. Medicated for cough x 1.

## 2015-05-29 NOTE — Progress Notes (Signed)
[facility] [place] NAME: Marvin Lewis    MR#:  [id number]  DATE OF BIRTH:  1951/07/19  SUBJECTIVE:  CHIEF COMPLAINT:   Chief Complaint  Patient presents with  . Cough  . Nasal Congestion  . Fever   Cough is the main concern at this time. Still has right chest pain.ON O2 at home. PET scan could not be done today by radiology.  REVIEW OF SYSTEMS:  CONSTITUTIONAL: Positive for fever, positive for fatigue and weakness. Positive for chills and sweats. EYES: No blurred or double vision. Wears reading glasses EARS, NOSE, AND THROAT: No tinnitus or ear pain. Positive for sore throat. Positive for chronic dysphagia after neck injury. positive for runny nose. RESPIRATORY: Positive for cough with discolored phlegm, positive for shortness of breath and wheezing. No hemoptysis.  CARDIOVASCULAR: Positive for right sided chest pain.  GASTROINTESTINAL: No nausea, positive for vomiting the other day, no diarrhea or abdominal pain. No blood in bowel movements. History of constipation GENITOURINARY: No dysuria, hematuria.  ENDOCRINE: No polyuria, nocturia,  HEMATOLOGY: No anemia, easy bruising or bleeding SKIN: No rash or lesion. MUSCULOSKELETAL: No joint pain or arthritis.  NEUROLOGIC: No tingling, numbness, weakness.  PSYCHIATRY: No anxiety or depression.   ROS  DRUG ALLERGIES:  No Known Allergies  VITALS:  Blood pressure 143/74, pulse 64, temperature 97.9 F (36.6 C), temperature source Oral, resp. rate 18, height '5\' 11"'$  (1.803 m), weight 95.074 kg (209 lb 9.6 oz), SpO2 98 %.  PHYSICAL EXAMINATION:  GENERAL: 64 y.o.-year-old patient lying in the bed with no acute distress.  EYES: Pupils equal, round, reactive to light and accommodation. No scleral icterus. Extraocular muscles intact.  HEENT: Head atraumatic, normocephalic. Oropharynx and nasopharynx clear.  NECK: Supple, no jugular venous distention. No thyroid  enlargement, no tenderness.  LUNGS: Decreased breath sounds bilaterally, very difficult to hear any air entry in the right lower lobe. minimal wheezing all lung fields, no rales,rhonchi or crepitation. No use of accessory muscles of respiration.  CARDIOVASCULAR: S1, S2 normal. No murmurs, rubs, or gallops.  ABDOMEN: Soft, nontender, nondistended. Bowel sounds present. No organomegaly or mass.  EXTREMITIES: Trace edema, no cyanosis on oxygen.  NEUROLOGIC: Cranial nerves II through XII are intact. Muscle strength 5/5 in all extremities. Sensation intact. Gait not checked.  PSYCHIATRIC: The patient is alert and oriented x 3.  SKIN: No rash, lesion, or ulcer.   Physical Exam LABORATORY PANEL:   CBC  Recent Labs Lab 05/27/15 0436  WBC 7.1  HGB 11.6*  HCT 36.0*  PLT 141*   ------------------------------------------------------------------------------------------------------------------  Chemistries   Recent Labs Lab 05/26/15 0951 05/27/15 0436  NA 140 135  K 4.1 3.9  CL 106 104  CO2 24 22  GLUCOSE 147* 249*  BUN 20 23*  CREATININE 0.99 1.02  CALCIUM 9.1 9.0  AST 35  --   ALT 35  --   ALKPHOS 125  --   BILITOT 0.7  --    ------------------------------------------------------------------------------------------------------------------  Cardiac Enzymes  Recent Labs Lab 05/26/15 0951  TROPONINI <0.03   ------------------------------------------------------------------------------------------------------------------  RADIOLOGY:  No results found.  ASSESSMENT AND PLAN:   1. COPD exacerbation.   continue nebulizer treatments. Empiric Levaquin. O2.  will swich to oral steroids.requires supplemental oxygen. 2. Right Lung mass with increasing size from recent CAT scan.   As per Dr. Raul Del- PET scan and then further planning.  Will need OP PFT and f/u with Dr. Genevive Bi. 3. Tobacco abuse smoking cessation counseling  done this admission 4. Type 2 diabetes-    continue Lantus and sliding scale also. Reduced lantus to 40 units at bedtime 5. Essential hypertension continue usual medications. 6. Gastroesophageal reflux disease without esophagitis -patient on omeprazole   All the records are reviewed and case discussed with Care Management/Social Workerr. Management plans discussed with the patient, family and they are in agreement.  CODE STATUS: full  TOTAL TIME TAKING CARE OF THIS PATIENT: 35 minutes.   More than 50% of the visit was spent in counseling/coordination of care  POSSIBLE D/C TOMORROW DEPENDING ON CLINICAL CONDITION AFTER PET SCAN   Hillary Bow R M.D on 05/29/2015   Between 7am to 6pm - Pager - [phone]  After 6pm go to [email or web address] - password [email or web address] [facility]  Office  [phone]  CC: Primary care physician; Enid Derry, MD

## 2015-05-29 NOTE — Plan of Care (Signed)
Problem: Discharge Progression Outcomes Goal: Other Discharge Outcomes/Goals Plan of care progress to goal:  Patient remains on 2L-O2 PRN. Patient c/o right chest/lung pain - PRN pain medications given, minimal relief noted. Telemetry monitoring continues. PRN cough medication given. PET scan to be completed tomorrow at approx. 8:30am - will be NPO after midnight. Up to bathroom to void with standby assist.

## 2015-05-29 NOTE — Discharge Instructions (Signed)
°  DIET:  Cardiac diet  DISCHARGE CONDITION:  Stable  ACTIVITY:  Activity as tolerated  OXYGEN:  Home Oxygen: yes   Oxygen Delivery: 2 liters/min via Patient connected to nasal cannula oxygen  DISCHARGE LOCATION:  home   If you experience worsening of your admission symptoms, develop shortness of breath, life threatening emergency, suicidal or homicidal thoughts you must seek medical attention immediately by calling 911 or calling your MD immediately  if symptoms less severe.  You Must read complete instructions/literature along with all the possible adverse reactions/side effects for all the Medicines you take and that have been prescribed to you. Take any new Medicines after you have completely understood and accpet all the possible adverse reactions/side effects.   Please note  You were cared for by a hospitalist during your hospital stay. If you have any questions about your discharge medications or the care you received while you were in the hospital after you are discharged, you can call the unit and asked to speak with the hospitalist on call if the hospitalist that took care of you is not available. Once you are discharged, your primary care physician will handle any further medical issues. Please note that NO REFILLS for any discharge medications will be authorized once you are discharged, as it is imperative that you return to your primary care physician (or establish a relationship with a primary care physician if you do not have one) for your aftercare needs so that they can reassess your need for medications and monitor your lab values.

## 2015-05-29 NOTE — Care Management (Signed)
Admitted to [facility] with the diagnosis of COPD. Lives with wife, Marvin Lewis, [phone]. Home oxygen thru [facility] x 1 year, uses prn. [facility] 3 years ago. [place] in the past. Uses a cane for ambulation. Wife helps with basic activities of daily living as needed. See Dr. Lynnae Sandhoff. Last seen 3 weeks ago. Family will transport. Shelbie Ammons RN MSN Care Management [phone]

## 2015-05-29 NOTE — Progress Notes (Signed)
Inpatient Diabetes Program Recommendations  AACE/ADA: New Consensus Statement on Inpatient Glycemic Control (2013)  Target Ranges:  Prepandial:   less than 140 mg/dL      Peak postprandial:   less than 180 mg/dL (1-2 hours)      Critically ill patients:  140 - 180 mg/dL    Results for Marvin, Lewis (MRN [id number]) as of 05/29/2015 07:48  Ref. Range 05/29/2015 07:22 05/29/2015 07:42  Glucose-Capillary Latest Ref Range: 65-99 mg/dL 63 (L) 64     Admit with: COPD  History: DM, HTN, COPD, Lung Mass  Home DM Meds: Lantus 45 units QHS       Novolog SSI  (Home Meds clarified through [place] tab in EPIC- Patient saw his PCP on 05/22/15 and Home Meds were listed there)  Current DM Orders: Lantus 45 units QHS            Novolog Resistant SSI (0-20 units) tid ac + HS            Novolog 4 units tidwc    **Patient with mild Hypoglycemic event this AM (fingerstick glucose- 63 mg/dl) after receiving 45 units Lantus the night prior.  **Patient did not eat that well yesterday (20-25% of meals).    MD- If patient continues to have early AM Hypoglycemia, please consider decreasing Lantus to 40 units QHS (10% reduction)     Will follow [name] RN, MSN, CDE Diabetes Coordinator Inpatient Glycemic Control Team Team Pager: [phone] (8a-5p)

## 2015-05-29 NOTE — Progress Notes (Signed)
Called MD to notify patient had a 13 beat run SVT at 2319 pm, was coughing and asymptomatic of pain other than constant pain he complained of earlier, that has lung involvement.

## 2015-05-29 NOTE — Progress Notes (Signed)
Patient ID: Marvin Lewis, male   DOB: 14-Oct-1951, 64 y.o.   MRN: [id number]  Chief Complaint  Patient presents with  . Cough  . Nasal Congestion  . Fever    Referred By Dr. Raul Del  Reason for Referral right hilar mass  HPI Location, Quality, Duration, Severity, Timing, Context, Modifying Factors, Associated Signs and Symptoms.  Marvin Lewis is a 64 y.o. male.  This is a 64 year old gentleman with a complicated past medical history. This dates back to at least 2010 when a CT scan was obtained for pneumonia. At that time he had a large right upper lobe cavitary mass and a right hilar calcified mass consistent with some type of necrotizing pneumonia with lymphadenopathy. He has been followed by Dr. Raul Del over the years and approximately 2 weeks ago he presented with increasing cough and shortness of breath. He states that his cough has been so severe that he has had some right-sided chest pain because of it. He does use oxygen at night but has noticed that his shortness of breath has gotten worse. He denied any fevers or chills. He is also a diabetic and states that his blood sugars have been under excellent control while at home but are elevated here in the hospital. He does take insulin once per day and states that he manages his blood sugars but they are always in the 120 range. He has not had any hemoptysis. He states his appetite is been good. He does smoke several cigarettes per day and has done so all of his life. He used to smoke much more when he was younger. In addition he drinks a pint of vodka per week and has drank more heavily in the past as well. He does use oxygen at night but not during the day. He states he is able to climb a flight of stairs slowly but that he would be extremely winded at the top. While in the hospital areas been treated for his underlying bronchitis and pneumonia. His had a CT scan performed which reveals a right calcified perihilar mass measuring 3-4  cm in size. When compared to the results from 2010 there may be a slight increase in size but there is essentially no significant change. I have been asked to see the patient for consideration of surgical resection.  The patient states that he had a hip replacement 3 years ago. After that time he spent approximately one month in the hospital and required a tracheostomy and gastrostomy. He's had multiple previous abdominal procedures as well. He states that he's been told he is at very high risk for any surgical intervention. Ultimately his tracheostomy and gastrostomy tubes were removed and the sites healed without incident. He has not required any further tracheal manipulation. He has a known vocal cord paralysis.   Past Medical History  Diagnosis Date  . Pneumonia   . Hypertension   . Diabetes mellitus without complication   . GERD (gastroesophageal reflux disease)   . Depression   . COPD (chronic obstructive pulmonary disease)   . Ascending aortic aneurysm   . Ascending aortic aneurysm     Past Surgical History  Procedure Laterality Date  . Peg placement N/A   . Peg tube removal N/A   . Tracheostomy N/A   . Cholecystectomy    . Pilonidal cyst excision N/A   . Hernia repair      Family History  Problem Relation Age of Onset  . Coronary artery disease Mother   .  Hypertension Mother   . Coronary artery disease Father   . Hypertension Father     Social History History  Substance Use Topics  . Smoking status: Current Every Day Smoker -- 0.25 packs/day for 40 years  . Smokeless tobacco: Not on file  . Alcohol Use: Yes    No Known Allergies  Current Facility-Administered Medications  Medication Dose Route Frequency Provider Last Rate Last Dose  . acetaminophen (TYLENOL) tablet 650 mg  650 mg Oral Q6H PRN Loletha Grayer, MD       Or  . acetaminophen (TYLENOL) suppository 650 mg  650 mg Rectal Q6H PRN Loletha Grayer, MD      . albuterol (PROVENTIL) (2.5 MG/3ML) 0.083%  nebulizer solution 2.5 mg  2.5 mg Nebulization Once Lavonia Drafts, MD   2.5 mg at 05/26/15 1543  . amLODipine (NORVASC) tablet 10 mg  10 mg Oral Daily Loletha Grayer, MD   10 mg at 05/29/15 0804  . benazepril (LOTENSIN) tablet 20 mg  20 mg Oral Daily Loletha Grayer, MD   20 mg at 05/29/15 0804  . citalopram (CELEXA) tablet 10 mg  10 mg Oral Daily Loletha Grayer, MD   10 mg at 05/29/15 0804  . diphenhydrAMINE (BENADRYL) capsule 25 mg  25 mg Oral QHS PRN Lytle Butte, MD      . docusate sodium (COLACE) capsule 100 mg  100 mg Oral BID Loletha Grayer, MD   100 mg at 05/29/15 0804  . enoxaparin (LOVENOX) injection 40 mg  40 mg Subcutaneous Q24H Loletha Grayer, MD   40 mg at 05/28/15 2038  . guaiFENesin-codeine 100-10 MG/5ML solution 10 mL  10 mL Oral Q4H PRN Lytle Butte, MD   10 mL at 05/29/15 1139  . insulin aspart (novoLOG) injection 0-20 Units  0-20 Units Subcutaneous TID WC Loletha Grayer, MD   7 Units at 05/28/15 1646  . insulin aspart (novoLOG) injection 0-5 Units  0-5 Units Subcutaneous QHS Loletha Grayer, MD   2 Units at 05/26/15 2232  . insulin aspart (novoLOG) injection 4 Units  4 Units Subcutaneous TID WC Loletha Grayer, MD   4 Units at 05/28/15 1644  . insulin glargine (LANTUS) injection 40 Units  40 Units Subcutaneous QHS Srikar Sudini, MD      . ipratropium-albuterol (DUONEB) 0.5-2.5 (3) MG/3ML nebulizer solution 3 mL  3 mL Nebulization TID Vaughan Basta, MD   3 mL at 05/29/15 0824  . ipratropium-albuterol (DUONEB) 0.5-2.5 (3) MG/3ML nebulizer solution 3 mL  3 mL Nebulization Q6H PRN Vaughan Basta, MD      . levofloxacin (LEVAQUIN) tablet 500 mg  500 mg Oral Daily Vaughan Basta, MD   500 mg at 05/29/15 0804  . metoprolol tartrate (LOPRESSOR) tablet 25 mg  25 mg Oral BID Loletha Grayer, MD   25 mg at 05/29/15 0804  . morphine 4 MG/ML injection 4 mg  4 mg Intravenous Q4H PRN Vaughan Basta, MD   4 mg at 05/29/15 0804  . nicotine (NICODERM CQ - dosed  in mg/24 hr) patch 7 mg  7 mg Transdermal Daily Vaughan Basta, MD   7 mg at 05/29/15 0805  . oxyCODONE (Oxy IR/ROXICODONE) immediate release tablet 10 mg  10 mg Oral Q4H PRN Srikar Sudini, MD      . pantoprazole (PROTONIX) EC tablet 40 mg  40 mg Oral Daily Loletha Grayer, MD   40 mg at 05/29/15 0804  . predniSONE (DELTASONE) tablet 50 mg  50 mg Oral Q breakfast Vaughan Basta, MD  50 mg at 05/29/15 0804  . sodium chloride 0.9 % injection 3 mL  3 mL Intravenous Q12H Loletha Grayer, MD   3 mL at 05/29/15 8719      Review of Systems A 10 point review of systems was asked and was negative except for the following positive findings cough, shortness of breath, oxygen dependence, and numbness in his lower extremities with ambulation but no claudication.  Blood pressure 142/88, pulse 78, temperature 97.9 F (36.6 C), temperature source Oral, resp. rate 18, height '5\' 11"'$  (1.803 m), weight 209 lb 9.6 oz (95.074 kg), SpO2 94 %.  Physical Exam CONSTITUTIONAL:  Pleasant, well-developed, well-nourished, and in no acute distress. EYES: Pupils equal and reactive to light, Sclera non-icteric EARS, NOSE, MOUTH AND THROAT:  The oropharynx was clear.  Dentition is in poor repair.  Oral mucosa pink and moist. LYMPH NODES:  Lymph nodes in the neck and axillae were normal RESPIRATORY:  Lungs were clear.  Normal respiratory effort without pathologic use of accessory muscles of respiration CARDIOVASCULAR: Heart was regular without murmurs.  There were no carotid bruits.  There were no palpable pulses in his lower extremities GI: The abdomen was soft, nontender, and nondistended. There were no palpable masses. There was no hepatosplenomegaly. There were normal bowel sounds in all quadrants. There were multiple abdominal scars consistent with laparotomy and gastrostomy tube. GU:  Rectal deferred.   MUSCULOSKELETAL:  Normal muscle strength and tone.  No clubbing or cyanosis.   SKIN:  There were no  pathologic skin lesions.  There were no nodules on palpation.  Well-healed tracheostomy site NEUROLOGIC:  Sensation is normal.  Cranial nerves are grossly intact. PSYCH:  Oriented to person, place and time.  Mood and affect are normal.  Data Reviewed I have personally reviewed the patient's CT scans compared them to his scans over the years. The lesion in question was present in 2010. I suspect that it is reactive to his upper lobe disease at that time. There does appear to be endobronchial erosion even back in 2010 saw not sure that this is the only cause of the patient's recent cough.  I have personally reviewed the patient's imaging, laboratory findings and medical records.    Assessment    Right hilar mass    Plan    I would recommend that we first start with a complete set of pulmonary function studies. If the pulmonary function studies are adequate for surgical resection then I would recommend a CT scan with contrast to assess the relationship of the mass to the pulmonary artery and vein. I would be happy to follow-up with the patient as an outpatient. I would hold off on bronchoscopy at this point until we see whether or not he is a surgical candidate.        Nestor Lewandowsky 05/29/2015, 12:38 PM

## 2015-05-29 NOTE — Progress Notes (Signed)
   05/29/15 0915  Clinical Encounter Type  Visited With Patient and family together  Visit Type Initial  Consult/Referral To Chaplain  Visited with patient and his wife.  Pt said he was feeling ok, but did not wish to talk to me at present time because he wanted to take a nap.  I told the patient and his wife to contact us if he needed anything.  Pt thanked me and wished me a good day.  [place] [phone]

## 2015-05-30 ENCOUNTER — Telehealth: Payer: Self-pay | Admitting: *Deleted

## 2015-05-30 ENCOUNTER — Inpatient Hospital Stay: Payer: Medicare Other

## 2015-05-30 DIAGNOSIS — E1165 Type 2 diabetes mellitus with hyperglycemia: Secondary | ICD-10-CM | POA: Insufficient documentation

## 2015-05-30 DIAGNOSIS — F1010 Alcohol abuse, uncomplicated: Secondary | ICD-10-CM | POA: Insufficient documentation

## 2015-05-30 DIAGNOSIS — I2510 Atherosclerotic heart disease of native coronary artery without angina pectoris: Secondary | ICD-10-CM | POA: Insufficient documentation

## 2015-05-30 DIAGNOSIS — L602 Onychogryphosis: Secondary | ICD-10-CM | POA: Insufficient documentation

## 2015-05-30 DIAGNOSIS — I10 Essential (primary) hypertension: Secondary | ICD-10-CM | POA: Insufficient documentation

## 2015-05-30 DIAGNOSIS — IMO0002 Reserved for concepts with insufficient information to code with codable children: Secondary | ICD-10-CM | POA: Insufficient documentation

## 2015-05-30 DIAGNOSIS — I440 Atrioventricular block, first degree: Secondary | ICD-10-CM | POA: Insufficient documentation

## 2015-05-30 DIAGNOSIS — J449 Chronic obstructive pulmonary disease, unspecified: Secondary | ICD-10-CM | POA: Insufficient documentation

## 2015-05-30 DIAGNOSIS — E785 Hyperlipidemia, unspecified: Secondary | ICD-10-CM | POA: Insufficient documentation

## 2015-05-30 DIAGNOSIS — K7460 Unspecified cirrhosis of liver: Secondary | ICD-10-CM | POA: Insufficient documentation

## 2015-05-30 DIAGNOSIS — M1610 Unilateral primary osteoarthritis, unspecified hip: Secondary | ICD-10-CM | POA: Insufficient documentation

## 2015-05-30 DIAGNOSIS — B1920 Unspecified viral hepatitis C without hepatic coma: Secondary | ICD-10-CM | POA: Insufficient documentation

## 2015-05-30 DIAGNOSIS — R0902 Hypoxemia: Secondary | ICD-10-CM | POA: Insufficient documentation

## 2015-05-30 DIAGNOSIS — J439 Emphysema, unspecified: Secondary | ICD-10-CM | POA: Insufficient documentation

## 2015-05-30 DIAGNOSIS — G4700 Insomnia, unspecified: Secondary | ICD-10-CM | POA: Insufficient documentation

## 2015-05-30 LAB — GLUCOSE, CAPILLARY
Glucose-Capillary: 51 mg/dL — ABNORMAL LOW (ref 65–99)
Glucose-Capillary: 67 mg/dL (ref 65–99)

## 2015-05-30 LAB — EXPECTORATED SPUTUM ASSESSMENT W REFEX TO RESP CULTURE

## 2015-05-30 LAB — EXPECTORATED SPUTUM ASSESSMENT W GRAM STAIN, RFLX TO RESP C: Special Requests: NORMAL

## 2015-05-30 MED ORDER — FLUDEOXYGLUCOSE F - 18 (FDG) INJECTION
12.4600 | Freq: Once | INTRAVENOUS | Status: AC | PRN
  Administered 2015-05-30: 09:00:00 12.46 via INTRAVENOUS

## 2015-05-30 MED ORDER — HYDROMORPHONE HCL 1 MG/ML IJ SOLN
1.0000 mg | Freq: Once | INTRAMUSCULAR | Status: AC
  Administered 2015-05-30: 1 mg via INTRAVENOUS
  Filled 2015-05-30: qty 1

## 2015-05-30 MED ORDER — HYDROCOD POLST-CPM POLST ER 10-8 MG/5ML PO SUER: 5.0000 mL | Freq: Two times a day (BID) | ORAL | Status: DC

## 2015-05-30 MED ORDER — MORPHINE SULFATE 2 MG/ML IJ SOLN
2.0000 mg | INTRAMUSCULAR | Status: DC | PRN
  Administered 2015-05-30: 04:00:00 2 mg via INTRAVENOUS
  Filled 2015-05-30: qty 1

## 2015-05-30 NOTE — Progress Notes (Signed)
Dr. Jannifer Franklin notified pt pain has increased again and pain medication not available at this time due to NPO for procedure today. MD to put in new order.   Marvin Lewis [employer]

## 2015-05-30 NOTE — Progress Notes (Signed)
Dr. Jannifer Franklin notified of pt's continued right chest pain r/t coughing and IV pain medication not due for another 2hr.  MD to put in new orders.   Hiram Gash [employer]

## 2015-05-30 NOTE — Discharge Summary (Signed)
Marvin Lewis NAME: Marvin Lewis    MR#:  [id number]  DATE OF BIRTH:  [birth year]  DATE OF ADMISSION:  05/26/2015 ADMITTING PHYSICIAN: Loletha Grayer, MD  DATE OF DISCHARGE: No discharge date for patient encounter.  PRIMARY CARE PHYSICIAN: Enid Derry, MD    ADMISSION DIAGNOSIS:  Shortness of breath [R06.02] COPD exacerbation [J44.1]  DISCHARGE DIAGNOSIS:  Active Problems:   COPD exacerbation   Lung cancer   SECONDARY DIAGNOSIS:   Past Medical History  Diagnosis Date  . Pneumonia   . Hypertension   . Diabetes mellitus without complication   . GERD (gastroesophageal reflux disease)   . Depression   . COPD (chronic obstructive pulmonary disease)   . Ascending aortic aneurysm   . Ascending aortic aneurysm      ADMITTING HISTORY  Marvin Lewis is a 64 y.o. male with a known history of COPD, lung mass, diabetes and hypertension. He has been short of breath for over a week. He saw his PMD and they gave him antibiotics and a cough medication and he did not get any better. He feels like his chest is closing up. He feels like he is coughing. He is bringing up clear phlegm that had turned to yellow than to green and now clear again. He is wheezing. He has chest pain over the right side of his chest worse when he coughs, chest pain is with coughing and severe 10 out of 10 when he does cough.   HOSPITAL COURSE:   1. COPD exacerbation.  Improved well with IV steroidal, nebulizers, antibiotics. Change to prednisone taper at discharge. Levaquin. Continue inhalers. 2. Right Lung mass with increasing size from recent CAT scan.  PET scan suggested possible granulomatous mass and not malignancy Will need OP PFT and f/u with Dr. Genevive Bi. Seen Dr. Faith Rogue in the hospital. He has his right mainstem bronchus compressed by the mass causing most of his symptoms. Patient had complications of bronchoscopy in the past and did  not want any further procedures in the hospital. 3. Tobacco abuse smoking cessation counseling done this admission 4. Type 2 diabetes-  Lantus, sliding scale insulin, diabetic diet. 5. Essential hypertension continue usual medications. 6. Gastroesophageal reflux disease without esophagitis -patient on omeprazole   Stable at time of discharge.  CONSULTS OBTAINED:  Treatment Team:  Vilinda Boehringer, MD Erby Pian, MD  DRUG ALLERGIES:  No Known Allergies  DISCHARGE MEDICATIONS:   Current Discharge Medication List    START taking these medications   Details  budesonide-formoterol (SYMBICORT) 160-4.5 MCG/ACT inhaler Inhale 2 puffs into the lungs 2 (two) times daily. Qty: 1 Inhaler, Refills: 12    levofloxacin (LEVAQUIN) 500 MG tablet Take 1 tablet (500 mg total) by mouth daily. Qty: 4 tablet, Refills: 0    OxyCODONE (OXYCONTIN) 20 mg T12A 12 hr tablet Take 1 tablet (20 mg total) by mouth every 12 (twelve) hours. Qty: 30 tablet, Refills: 0    oxyCODONE 10 MG TABS Take 1 tablet (10 mg total) by mouth every 4 (four) hours as needed for moderate pain. Qty: 30 tablet, Refills: 0    predniSONE (DELTASONE) 20 MG tablet Take 2 tablets (40 mg total) by mouth daily with breakfast. Qty: 8 tablet, Refills: 0      CONTINUE these medications which have NOT CHANGED   Details  amLODipine (NORVASC) 10 MG tablet Take 1 tablet by mouth daily.    benazepril (LOTENSIN) 20 MG tablet Take 1 tablet  by mouth daily.    citalopram (CELEXA) 10 MG tablet Take 10 mg by mouth daily.    insulin aspart (NOVOLOG) 100 UNIT/ML FlexPen Inject 45 Units into the skin every evening.    ipratropium-albuterol (DUONEB) 0.5-2.5 (3) MG/3ML SOLN Take 3 mLs by nebulization every 6 (six) hours as needed.    metoprolol tartrate (LOPRESSOR) 25 MG tablet Take 1 tablet by mouth 2 (two) times daily.    omeprazole (PRILOSEC) 20 MG capsule Take 1 capsule by mouth daily.        Today    VITAL SIGNS:  Blood  pressure 165/76, pulse 58, temperature 97.8 F (36.6 C), temperature source Oral, resp. rate 18, height '5\' 11"'$  (1.803 m), weight 94.802 kg (209 lb), SpO2 96 %.  I/O:   Intake/Output Summary (Last 24 hours) at 05/30/15 1306 Last data filed at 05/29/15 1700  Gross per 24 hour  Intake    240 ml  Output      0 ml  Net    240 ml    PHYSICAL EXAMINATION:  Physical Exam  GENERAL:  64 y.o.-year-old patient lying in the bed with no acute distress.  LUNGS: Bilateral mild weezing CARDIOVASCULAR: S1, S2 normal. No murmurs, rubs, or gallops.  ABDOMEN: Soft, non-tender, non-distended. Bowel sounds present. No organomegaly or mass.  NEUROLOGIC: Moves all 4 extremities. PSYCHIATRIC: The patient is alert and oriented x 3.  SKIN: No obvious rash, lesion, or ulcer.   DATA REVIEW:   CBC  Recent Labs Lab 05/27/15 0436  WBC 7.1  HGB 11.6*  HCT 36.0*  PLT 141*    Chemistries   Recent Labs Lab 05/26/15 0951 05/27/15 0436  NA 140 135  K 4.1 3.9  CL 106 104  CO2 24 22  GLUCOSE 147* 249*  BUN 20 23*  CREATININE 0.99 1.02  CALCIUM 9.1 9.0  AST 35  --   ALT 35  --   ALKPHOS 125  --   BILITOT 0.7  --     Cardiac Enzymes  Recent Labs Lab 05/26/15 0951  TROPONINI <0.03    Microbiology Results  Results for orders placed or performed during the hospital encounter of 05/26/15  Culture, expectorated sputum-assessment     Status: None   Collection Time: 05/29/15  6:03 PM  Result Value Ref Range Status   Specimen Description SPUTUM  Final   Special Requests Normal  Final   Sputum evaluation THIS SPECIMEN IS ACCEPTABLE FOR SPUTUM CULTURE  Final   Report Status 05/30/2015 FINAL  Final  Culture, respiratory (NON-Expectorated)     Status: None (Preliminary result)   Collection Time: 05/29/15  6:03 PM  Result Value Ref Range Status   Specimen Description SPUTUM  Final   Special Requests Normal Reflexed from [id number]  Final   Gram Stain PENDING  Incomplete   Culture HOLDING FOR  POSSIBLE PATHOGEN  Final   Report Status PENDING  Incomplete    RADIOLOGY:  Nm Pet Image Initial (pi) Whole Body  05/30/2015   CLINICAL DATA:  Initial treatment strategy for lung mass  EXAM: NUCLEAR MEDICINE PET WHOLE BODY  TECHNIQUE: 12.46 mCi F-18 FDG was injected intravenously. Full-ring PET imaging was performed from the vertex to the feet after the radiotracer. CT data was obtained and used for attenuation correction and anatomic localization.  FASTING BLOOD GLUCOSE:  Value:  61 mg/dl  COMPARISON:  PET-CT 02/05/2012 and chest CT  FINDINGS: Head/Neck: No hypermetabolic lymph nodes in the neck. Moderate activity is noted in the neck  muscles and cervical spine facet joints. There is also moderate brown fat activity noted.  Chest: The enlarging calcified right hilar mass is hypermetabolic with SUV max of 5.2. No enlarged or hypermetabolic mediastinal lymph nodes. No other worrisome lung lesions. Stable underlying emphysematous changes and areas of pulmonary scarring. Stable advanced three-vessel coronary artery calcifications and moderate tortuosity, ectasia and calcification of the thoracic aorta.  Abdomen/Pelvis: No abnormal hypermetabolic activity within the liver, pancreas, adrenal glands, or spleen. No hypermetabolic lymph nodes in the abdomen or pelvis. Diffuse muscle uptake may suggest some type of diffuse myositis or collagen vascular disease or drug related process.  Skeleton: No focal hypermetabolic activity to suggest skeletal metastasis.  IMPRESSION: 1. Chronic calcified right hilar mass is mildly hypermetabolic with SUV max of 5.2. I still think this is most likely some type of chronic granulomas process and not neoplasm. No enlarged or hypermetabolic mediastinal or hilar lymph nodes. Biopsy versus continued CT surveillance. 2. Stable emphysematous changes and pulmonary scarring. 3. Advanced atherosclerotic disease involving the aorta and branch vessels including the coronary arteries. This is  fairly extensive disease for age. 4. Diffuse FDG uptake in the muscles suggesting some type of systemic myositis.   Electronically Signed   By: Marijo Sanes M.D.   On: 05/30/2015 11:16      Follow up with PCP in 1 week.  Management plans discussed with the patient, family and they are in agreement.  CODE STATUS:     Code Status Orders        Start     Ordered   05/26/15 1413  Full code   Continuous     05/26/15 1413    Advance Directive Documentation        Most Recent Value   Type of Advance Directive  Living will, Healthcare Power of Attorney   Pre-existing out of facility DNR order (yellow form or pink MOST form)     "MOST" Form in Place?        TOTAL TIME TAKING CARE OF THIS PATIENT ON DAY OF DISCHARGE: more than 30  minutes.    Hillary Bow R M.D on 05/30/2015 at 1:06 PM  Between 7am to 6pm - Pager - [phone]  After 6pm go to [email or web address] - password [email or web address] [facility]  Office  [phone]  CC: Primary care physician; Enid Derry, MD

## 2015-05-30 NOTE — Progress Notes (Signed)
Date: 05/30/2015,   MRN# [id number] Marvin Lewis 31-Mar-1951 Code Status:     Code Status Orders        Start     Ordered   05/26/15 1413  Full code   Continuous     05/26/15 1413    Advance Directive Documentation        Most Recent Value   Type of Advance Directive  Living will, Healthcare Power of Attorney   Pre-existing out of facility DNR order (yellow form or pink MOST form)     "MOST" Form in Place?       Hosp day:'@LENGTHOFSTAYDAYS'$ @ Referring MD: '@ATDPROV'$ @     PCP:    i     HPI: This am still has a mod cough, no blood or fever. Sob better, per scan noted. Ok to go home with pot patient follow up  PMHX:   Past Medical History  Diagnosis Date  . Pneumonia   . Hypertension   . Diabetes mellitus without complication   . GERD (gastroesophageal reflux disease)   . Depression   . COPD (chronic obstructive pulmonary disease)   . Ascending aortic aneurysm   . Ascending aortic aneurysm    Surgical Hx:  Past Surgical History  Procedure Laterality Date  . Peg placement N/A   . Peg tube removal N/A   . Tracheostomy N/A   . Cholecystectomy    . Pilonidal cyst excision N/A   . Hernia repair     Family Hx:  Family History  Problem Relation Age of Onset  . Coronary artery disease Mother   . Hypertension Mother   . Coronary artery disease Father   . Hypertension Father    Social Hx:   History  Substance Use Topics  . Smoking status: Current Every Day Smoker -- 0.25 packs/day for 40 years  . Smokeless tobacco: Not on file  . Alcohol Use: Yes   Medication:    Home Medication:  Current Outpatient Rx  Name  Route  Sig  Dispense  Refill  . budesonide-formoterol (SYMBICORT) 160-4.5 MCG/ACT inhaler   Inhalation   Inhale 2 puffs into the lungs 2 (two) times daily.   1 Inhaler   12   . levofloxacin (LEVAQUIN) 500 MG tablet   Oral   Take 1 tablet (500 mg total) by mouth daily.   4 tablet   0   . OxyCODONE (OXYCONTIN) 20 mg T12A 12 hr tablet    Oral   Take 1 tablet (20 mg total) by mouth every 12 (twelve) hours.   30 tablet   0   . oxyCODONE 10 MG TABS   Oral   Take 1 tablet (10 mg total) by mouth every 4 (four) hours as needed for moderate pain.   30 tablet   0   . predniSONE (DELTASONE) 20 MG tablet   Oral   Take 2 tablets (40 mg total) by mouth daily with breakfast.   8 tablet   0     Current Medication: '@CURMEDTAB'$ @   Allergies:  Review of patient's allergies indicates no known allergies.  Review of Systems: Gen:  Denies  fever, sweats, chills HEENT: Denies blurred vision, double vision, ear pain, eye pain, hearing loss, nose bleeds, sore throat Cvc:  No dizziness, chest pain or heaviness Resp:   Better but still coughing Gi: Denies swallowing difficulty, stomach pain, nausea or vomiting, diarrhea, constipation, bowel incontinence Gu:  Denies bladder incontinence, burning urine Ext:   No Joint pain, stiffness or swelling  Skin: No skin rash, easy bruising or bleeding or hives Endoc:  No polyuria, polydipsia , polyphagia or weight change Psych: No depression, insomnia or hallucinations  Other:  All other systems negative  Physical Examination:   VS: BP 165/76 mmHg  Pulse 58  Temp(Src) 97.8 F (36.6 C) (Oral)  Resp 18  Ht '5\' 11"'$  (1.803 m)  Wt 209 lb (94.802 kg)  BMI 29.16 kg/m2  SpO2 96%  General Appearance: No distress  Neuro: without focal findings, mental status, speech normal, alert and oriented, cranial nerves 2-12 intact, reflexes normal and symmetric, sensation grossly normal  HEENT: PERRLA, EOM intact, no ptosis, no other lesions noticed, Mallampati: Pulmonary:.No wheezing, No rales  Sputum Production:   Cardiovascular:  Normal S1,S2.  No m/r/g.  Abdominal aorta pulsation normal.    Abdomen:Benign, Soft, non-tender, No masses, hepatosplenomegaly, No lymphadenopathy Endoc: No evident thyromegaly, no signs of acromegaly or Cushing features Skin:   warm, no rashes, no ecchymosis  Extremities:  normal, no cyanosis, clubbing, no edema, warm with normal capillary refill. Other findings:   Labs results:   No results for input(s): HGB, HCT, MCV, WBC, POTASSIUM, CHLORIDE, BUN, CREATININE, GLUCOSE, CALCIUM, INR, PTT in the last 72 hours.  Invalid input(s): PLATELET, BANDS, NEUTROPHIL, LYMPHOCYTE, MONOCYTE, EOSINOPHILS, BASOPHIL, SODIUM, BICARBONATE, MAGNESIUM, PHOSPHORUS, PT, SGPT, SGOT,    No results for input(s): PH in the last 72 hours.  Invalid input(s): PCO2, PO2, BASEEXCESS, BASEDEFICITE, TFT  Culture results:     Rad results:   Nm Pet Image Initial (pi) Whole Body  05/30/2015   CLINICAL DATA:  Initial treatment strategy for lung mass  EXAM: NUCLEAR MEDICINE PET WHOLE BODY  TECHNIQUE: 12.46 mCi F-18 FDG was injected intravenously. Full-ring PET imaging was performed from the vertex to the feet after the radiotracer. CT data was obtained and used for attenuation correction and anatomic localization.  FASTING BLOOD GLUCOSE:  Value:  61 mg/dl  COMPARISON:  PET-CT 02/05/2012 and chest CT  FINDINGS: Head/Neck: No hypermetabolic lymph nodes in the neck. Moderate activity is noted in the neck muscles and cervical spine facet joints. There is also moderate brown fat activity noted.  Chest: The enlarging calcified right hilar mass is hypermetabolic with SUV max of 5.2. No enlarged or hypermetabolic mediastinal lymph nodes. No other worrisome lung lesions. Stable underlying emphysematous changes and areas of pulmonary scarring. Stable advanced three-vessel coronary artery calcifications and moderate tortuosity, ectasia and calcification of the thoracic aorta.  Abdomen/Pelvis: No abnormal hypermetabolic activity within the liver, pancreas, adrenal glands, or spleen. No hypermetabolic lymph nodes in the abdomen or pelvis. Diffuse muscle uptake may suggest some type of diffuse myositis or collagen vascular disease or drug related process.  Skeleton: No focal hypermetabolic activity to suggest  skeletal metastasis.  IMPRESSION: 1. Chronic calcified right hilar mass is mildly hypermetabolic with SUV max of 5.2. I still think this is most likely some type of chronic granulomas process and not neoplasm. No enlarged or hypermetabolic mediastinal or hilar lymph nodes. Biopsy versus continued CT surveillance. 2. Stable emphysematous changes and pulmonary scarring. 3. Advanced atherosclerotic disease involving the aorta and branch vessels including the coronary arteries. This is fairly extensive disease for age. 4. Diffuse FDG uptake in the muscles suggesting some type of systemic myositis.   Electronically Signed   By: Marijo Sanes M.D.   On: 05/30/2015 11:16      EKG:     Other:   Assessment and Plan: 1. COPD. Stage III. Bronchospasm better, S 2. Vocal cord  paralysis with chronic raspy voice. 3. Status post tracheostomy and no signs of stridor or significant stenosis. 4. Calcified Right infrahilar fullness, mildly pet positive, no change x 4 years, right middle lobe collapse(extrinsix compression) ? Granuloma, DOUBT slow growing tumor.d 5. Ascending 4.4 cm thoracic aneurysm  Plan   1. Agree with present copd regimen and d/c palns 2. Stay away from smoking  3. Out patient vascular regarding thoracic aneurysm 4. Hold on bronchoscopy per above, 5. See me in one week for recheck, PFTS etc 6. Follow up on sptum c/s  I have personally obtained a history, examined the patient, evaluated laboratory and imaging results, formulated the assessment and plan and placed orders.  The Patient requires high complexity decision making for assessment and support, frequent evaluation and titration of therapies, application of advanced monitoring technologies and extensive interpretation of multiple databases.   Marella Vanderpol,M.D. Pulmonary & Critical care Medicine [facility]

## 2015-05-30 NOTE — Plan of Care (Signed)
Problem: Discharge Progression Outcomes Goal: Other Discharge Outcomes/Goals Plan of care progress to goals: 1. C/o right chest/lung pain relieved by schedule Oxycodone & PRN Morphine. PRN cough medications given with noted relief. 2. Hemodynamically:              -VSS, afebrile, remains on 2L O2 PRN             -Telemetry monitoring continues.  3. Out of bed w/ standby assistance  4. NPO for PET scan to be completed today  No fall/injury this shift. Will continue to assess.

## 2015-05-30 NOTE — Progress Notes (Signed)
Inpatient Diabetes Program Recommendations  AACE/ADA: New Consensus Statement on Inpatient Glycemic Control (2013)  Target Ranges:  Prepandial:   less than 140 mg/dL      Peak postprandial:   less than 180 mg/dL (1-2 hours)      Critically ill patients:  140 - 180 mg/dL     Results for DORA, CLAUSS (MRN [id number]) as of 05/30/2015 11:38  Ref. Range 05/30/2015 07:16 05/30/2015 11:18  Glucose-Capillary Latest Ref Range: 65-99 mg/dL 51 (L) 67     Current DM Orders: Lantus 40 units QHS  Novolog Resistant SSI (0-20 units) tid ac + HS  Novolog 4 units tidwc    **Patient with Hypoglycemic event again this AM (fingerstick glucose- 51 mg/dl) after receiving 40 units Lantus the night prior.  **Patient ate 100% of meals yesterday.  Unsure why patient experienced Hypoglycemia this AM.    MD- If patient continues to have early AM Hypoglycemia, please consider decreasing Lantus further to 35 units QHS (another 10% reduction)  Please also consider reducing Novolog SSI to Moderate scale (0-15 units) tid ac + HS (currently ordered as Resistant: 0-20 units)  Please use Glycemic Control Order set     Will follow [name] RN, MSN, CDE Diabetes Coordinator Inpatient Glycemic Control Team Team Pager: [phone] (8a-5p)

## 2015-05-31 DIAGNOSIS — R918 Other nonspecific abnormal finding of lung field: Secondary | ICD-10-CM

## 2015-05-31 LAB — CULTURE, RESPIRATORY
Culture: NORMAL
SPECIAL REQUESTS: NORMAL

## 2015-05-31 LAB — CULTURE, RESPIRATORY W GRAM STAIN

## 2015-06-01 ENCOUNTER — Encounter: Payer: Medicare Other | Admitting: Surgery

## 2015-06-01 DIAGNOSIS — J431 Panlobular emphysema: Secondary | ICD-10-CM | POA: Diagnosis not present

## 2015-06-01 DIAGNOSIS — R918 Other nonspecific abnormal finding of lung field: Secondary | ICD-10-CM | POA: Diagnosis not present

## 2015-06-01 DIAGNOSIS — Z720 Tobacco use: Secondary | ICD-10-CM | POA: Diagnosis not present

## 2015-06-06 ENCOUNTER — Ambulatory Visit: Payer: Self-pay | Admitting: Family Medicine

## 2015-06-06 DIAGNOSIS — Z720 Tobacco use: Secondary | ICD-10-CM | POA: Diagnosis not present

## 2015-06-06 DIAGNOSIS — J431 Panlobular emphysema: Secondary | ICD-10-CM | POA: Diagnosis not present

## 2015-06-06 DIAGNOSIS — R05 Cough: Secondary | ICD-10-CM | POA: Diagnosis not present

## 2015-06-06 DIAGNOSIS — R0902 Hypoxemia: Secondary | ICD-10-CM | POA: Diagnosis not present

## 2015-06-06 DIAGNOSIS — R590 Localized enlarged lymph nodes: Secondary | ICD-10-CM | POA: Diagnosis not present

## 2015-06-06 LAB — GLUCOSE, CAPILLARY
Comment 1: 61
Glucose-Capillary: 61 mg/dL — ABNORMAL LOW (ref 65–99)

## 2015-06-08 ENCOUNTER — Encounter: Payer: Self-pay | Admitting: Cardiothoracic Surgery

## 2015-06-08 ENCOUNTER — Inpatient Hospital Stay: Payer: Medicare Other | Attending: Cardiothoracic Surgery | Admitting: Cardiothoracic Surgery

## 2015-06-08 VITALS — BP 130/72 | HR 76 | Temp 97.2°F | Resp 22 | Wt 216.3 lb

## 2015-06-08 DIAGNOSIS — R918 Other nonspecific abnormal finding of lung field: Secondary | ICD-10-CM | POA: Diagnosis not present

## 2015-06-08 NOTE — Progress Notes (Signed)
[facility] Inpatient Post-Op Note  Patient ID: Marvin Lewis, male   DOB: Apr 21, 1951, 64 y.o.   MRN: [id number]  HISTORY: This patient returns to the office today in follow-up of his recent hospitalization. He was admitted to the hospital for shortness of breath and pneumonia. He has a known history of a right hilar mass which is calcified and felt to be most consistent with granulomatous disease. This has been present radiologically for the last 6 years. There is associated right middle lobe collapse. There was a right upper lobe necrotizing pneumonia which is now a small scar. I do not see any evidence of a recurrent pneumonia on the most recent CT scan and PET scan. The patient continues to complain of pain in his lungs. He also states she's had some low-grade fevers. He saw Dr. Raul Del earlier this week and was diagnosed with bronchitis and placed on prednisone and amoxicillin. In addition he uses his pulse oximeter at home and these have been in the 80s. Today during our clinic visit they were 91-94. He continues to have a cough which is nonproductive. He continues to feel pain in his side.   Filed Vitals:   06/08/15 0847  BP: 130/72  Pulse: 76  Temp: 97.2 F (36.2 C)  Resp: 22     EXAM: Resp: Lungs are clear bilaterally.  No respiratory distress, normal effort. Heart:  Regular without murmurs Abd:  Abdomen is soft, non distended and non tender. No masses are palpable.  There is no rebound and no guarding.  Neurological: Alert and oriented to person, place, and time. Coordination normal.  Skin: Skin is warm and dry. No rash noted. No diaphoretic. No erythema. No pallor.  Psychiatric: Normal mood and affect. Normal behavior. Judgment and thought content normal.    ASSESSMENT: I have independently reviewed the patient's CT scans and PET scans. The right hilar mass and associated right middle lobe atelectasis have been present for 6 years. They may be slightly larger on the more recent  exams but they are essentially unchanged. I had a long discussion today with the patient and his wife regarding the management of their presumed granulomatous mass in the right hilum.  Additionally the patient continues to smoke at least 4 cigarettes per day.   PLAN:   I reviewed with the patient and his wife the options. He would need at least a bilobectomy for surgical resection. Given his underlying lung disease I do not believe that would be possible. I also discussed with him the possibility of bronchoscopy with stent placement. He causes would involve sedation or general anesthesia he has refused. I have little else to offer him at this time. I did suggest that some baseline pulmonary function studies might be beneficial. I did not make a return visit for him.    Nestor Lewandowsky, MD

## 2015-06-14 ENCOUNTER — Encounter: Payer: Self-pay | Admitting: Family Medicine

## 2015-06-14 ENCOUNTER — Ambulatory Visit (INDEPENDENT_AMBULATORY_CARE_PROVIDER_SITE_OTHER): Payer: Medicare Other | Admitting: Family Medicine

## 2015-06-14 VITALS — BP 126/80 | HR 72 | Temp 98.1°F | Wt 205.0 lb

## 2015-06-14 DIAGNOSIS — R0789 Other chest pain: Secondary | ICD-10-CM | POA: Diagnosis not present

## 2015-06-14 DIAGNOSIS — R0902 Hypoxemia: Secondary | ICD-10-CM

## 2015-06-14 DIAGNOSIS — R252 Cramp and spasm: Secondary | ICD-10-CM | POA: Diagnosis not present

## 2015-06-14 DIAGNOSIS — E119 Type 2 diabetes mellitus without complications: Secondary | ICD-10-CM | POA: Diagnosis not present

## 2015-06-14 DIAGNOSIS — I10 Essential (primary) hypertension: Secondary | ICD-10-CM | POA: Diagnosis not present

## 2015-06-14 DIAGNOSIS — B182 Chronic viral hepatitis C: Secondary | ICD-10-CM | POA: Diagnosis not present

## 2015-06-14 DIAGNOSIS — I639 Cerebral infarction, unspecified: Secondary | ICD-10-CM

## 2015-06-14 DIAGNOSIS — R197 Diarrhea, unspecified: Secondary | ICD-10-CM | POA: Diagnosis not present

## 2015-06-14 NOTE — Progress Notes (Signed)
BP 126/80 mmHg  Pulse 72  Temp(Src) 98.1 F (36.7 C)  Wt 205 lb (92.987 kg)  SpO2 97%   Subjective:    Patient ID: Marvin Lewis, male    DOB: [birth year], 64 y.o.   MRN: [id number]  HPI: Marvin Lewis is a 64 y.o. male  Chief Complaint  Patient presents with  . Diarrhea    from antibiotic possibly?, with bloody stools  . URI    still having alot of congestion   He went to urgent care one month ago; he was given a CXR and was started on antibiotic; he went back to the walk-in clinic with oxygen 87% discovered and then to the ER, did CT scan and he did not have pneumonia; kept him for four days He had a PET scan done; no cancer  He has been to see his pulmonologist He has also seen Dr. Faith Rogue; reviewed all of the old CT scans The present day scans look better than the old ones He is still having congestion He saw Dr. Raul Del last Tuesday; has had low grade fevers, on two antibiotics and one round of prednisone; saw him on Dr. Raul Del and was put on augmentin; that tore his stomach up; he had blood in the stool and they talked to an internist over the weekend; if bleeding didn't stop, go to ER; he continued to have a little bit of blood when going to the bathroom; having diarrhea, off antibiotic for 3 days; last antibiotic was Saturday morning; finished prednisone; bleeding has stopped Dr. Raul Del has orderd him home oxygen; 78% at home the other day a week ago His wife was sick with an upper respiratory infection Right now; no fevers; even in the hospital 99 and 100 range; sweats; low-grade; no night sweats Bruising easily; has bruises from IV and sticks in the hospital Does not feel shaky, but has been having cramps in hands and sides;  Aortic aneursym 4.4 cm ascending found on CT scan, [place] [facility] vascular and CT surgeon have both evaluated this; he is not a surgical candidate Lost some weight, sick for a month Last A1C was 6.5 in March He has bad pain along  the left flank; lung problem is on the right; right along the lower ribs, where he had fracture; wonders if diverticulitis; no mucous in the stools; the pain has been there for two weeks; hurts when he coughs  Relevant past medical, surgical, family and social history reviewed and updated as indicated. Interim medical history since our last visit reviewed. Allergies and medications reviewed and updated.  Review of Systems  Constitutional: Negative for fever (nothing over 100.5, but "low grade" reported) and unexpected weight change (but has lost weight, sick all month).  Respiratory: Positive for cough.   Gastrointestinal: Positive for diarrhea and blood in stool.  Neurological: Positive for tremors (sees neurologist next week).   Per HPI unless specifically indicated above     Objective:    BP 126/80 mmHg  Pulse 72  Temp(Src) 98.1 F (36.7 C)  Wt 205 lb (92.987 kg)  SpO2 97%  Wt Readings from Last 3 Encounters:  06/14/15 205 lb (92.987 kg)  06/08/15 216 lb 4.3 oz (98.1 kg)  05/30/15 209 lb (94.802 kg)    Physical Exam  Constitutional: He appears well-developed and well-nourished. No distress.  HENT:  Head: Normocephalic and atraumatic.  Eyes: EOM are normal. No scleral icterus.  Neck: No JVD present. No thyromegaly present.  Cardiovascular: Normal rate  and regular rhythm.   Pulmonary/Chest: Effort normal. No respiratory distress. He exhibits tenderness (tenderness to palpation along left posterolateral ribs).  Abdominal: Soft. Bowel sounds are normal. He exhibits no distension. There is no tenderness. There is no guarding.  Musculoskeletal: He exhibits no edema.  Neurological: He is alert.  Choreiform movements, right upper extremity most noticeable  Skin: Skin is warm and dry. No pallor.  Numerous ecchymoses on extensor surfaces of limbs  Psychiatric: He has a normal mood and affect. Thought content normal.      Assessment & Plan:   Problem List Items Addressed This  Visit      Cardiovascular and Mediastinum   Hypertension    Well-controlled today        Digestive   Hepatitis C    He is managed by Dr. Gustavo Lah        Endocrine   Diabetes mellitus without complication    R7N just done in the hospital and it was 6.1; excellent control; not due again until December 17th or just after        Other   Hypoxemia    Pulse ox okay in the office today; managed by pulmonologist       Other Visit Diagnoses    Diarrhea    -  Primary    stool specimen for C diff, recent antibiotic use; treat if indicated; start probiotics; reasons to seek care reviewed; refer to GI if needed    Relevant Orders    Stool C-Diff Toxin Assay    CBC With Differential/Platelet    Comprehensive metabolic panel    Cramps, extremity        check Mg2+ and K+, may be electrolyte loss secondary to diarrhea    Relevant Orders    Magnesium    Left-sided chest wall pain        sore to touch and with inspiration, cough; doubt related to GI issues; suspect pleurisy; also site of previous rib fx; he declined xray offer       Follow up plan: Return in about 2 weeks (around 06/28/2015) for current issues; Sept for diabetes.  Orders Placed This Encounter  Procedures  . Stool C-Diff Toxin Assay  . CBC With Differential/Platelet  . Magnesium  . Comprehensive metabolic panel    Order Specific Question:  Has the patient fasted?    Answer:  Yes   An After Visit Summary was printed and given to the patient.

## 2015-06-14 NOTE — Patient Instructions (Addendum)
Please return the stool specimens as quickly as possible Do start a probiotic daily Watch for blood or mucous in stools and if that occurs, we'll refer to Dr. Gustavo Lah (GI) Do consult with your lung doctor about your cough and symptoms related to your lungs Try to eat as healthy possible I would suggest Mucinex to help loosen phlegm, but run that by Dr. Raul Del Return in 2 weeks for f/u of diarrhea, breathing, etc. but okay to cancel if 100% better Return around Sept 18th or just after, but certainly sooner if needed, for diabetes etc.

## 2015-06-14 NOTE — Assessment & Plan Note (Signed)
Pulse ox okay in the office today; managed by pulmonologist

## 2015-06-14 NOTE — Assessment & Plan Note (Signed)
He is managed by Dr. Gustavo Lah

## 2015-06-14 NOTE — Assessment & Plan Note (Signed)
Well controlled today.

## 2015-06-14 NOTE — Assessment & Plan Note (Signed)
A1C just done in the hospital and it was 6.1; excellent control; not due again until December 17th or just after

## 2015-06-15 LAB — COMPREHENSIVE METABOLIC PANEL
A/G RATIO: 1.5 (ref 1.1–2.5)
ALT: 32 IU/L (ref 0–44)
AST: 31 IU/L (ref 0–40)
Albumin: 3.8 g/dL (ref 3.6–4.8)
Alkaline Phosphatase: 106 IU/L (ref 39–117)
BUN/Creatinine Ratio: 26 — ABNORMAL HIGH (ref 10–22)
BUN: 32 mg/dL — ABNORMAL HIGH (ref 8–27)
Bilirubin Total: 0.4 mg/dL (ref 0.0–1.2)
CHLORIDE: 99 mmol/L (ref 97–108)
CO2: 22 mmol/L (ref 18–29)
Calcium: 9.5 mg/dL (ref 8.6–10.2)
Creatinine, Ser: 1.24 mg/dL (ref 0.76–1.27)
GFR calc Af Amer: 71 mL/min/{1.73_m2} (ref >59)
GFR, EST NON AFRICAN AMERICAN: 61 mL/min/{1.73_m2} (ref >59)
Globulin, Total: 2.6 g/dL (ref 1.5–4.5)
Glucose: 63 mg/dL — ABNORMAL LOW (ref 65–99)
POTASSIUM: 5 mmol/L (ref 3.5–5.2)
SODIUM: 138 mmol/L (ref 134–144)
Total Protein: 6.4 g/dL (ref 6.0–8.5)

## 2015-06-15 LAB — MAGNESIUM: Magnesium: 2 mg/dL (ref 1.6–2.3)

## 2015-06-16 ENCOUNTER — Telehealth: Payer: Self-pay

## 2015-06-16 LAB — CBC WITH DIFFERENTIAL/PLATELET
BASOS: 0 %
Basophils Absolute: 0 10*3/uL (ref 0.0–0.2)
EOS (ABSOLUTE): 0.3 10*3/uL (ref 0.0–0.4)
Eos: 3 %
HEMATOCRIT: 37.7 % (ref 37.5–51.0)
Hemoglobin: 12.5 g/dL — ABNORMAL LOW (ref 12.6–17.7)
Immature Grans (Abs): 0 10*3/uL (ref 0.0–0.1)
Immature Granulocytes: 0 %
LYMPHS ABS: 2 10*3/uL (ref 0.7–3.1)
Lymphs: 19 %
MCH: 28.4 pg (ref 26.6–33.0)
MCHC: 33.2 g/dL (ref 31.5–35.7)
MCV: 86 fL (ref 79–97)
Monocytes Absolute: 0.5 10*3/uL (ref 0.1–0.9)
Monocytes: 5 %
NEUTROS ABS: 7.8 10*3/uL — AB (ref 1.4–7.0)
Neutrophils: 73 %
Platelets: 209 10*3/uL (ref 150–379)
RBC: 4.4 x10E6/uL (ref 4.14–5.80)
RDW: 15.4 % (ref 12.3–15.4)
WBC: 10.7 10*3/uL (ref 3.4–10.8)

## 2015-06-16 NOTE — Telephone Encounter (Signed)
He said that his diarrhea has cleared up so he's not going to do the stool samples. I advised him if it did start up again, to make sure to do them.

## 2015-06-20 ENCOUNTER — Other Ambulatory Visit: Payer: Self-pay

## 2015-06-20 MED ORDER — BENAZEPRIL HCL 20 MG PO TABS: 20.0000 mg | ORAL_TABLET | Freq: Every day | ORAL | Status: DC

## 2015-06-20 NOTE — Telephone Encounter (Signed)
They would like a 90 day supply.

## 2015-06-26 DIAGNOSIS — I639 Cerebral infarction, unspecified: Secondary | ICD-10-CM | POA: Diagnosis not present

## 2015-07-05 ENCOUNTER — Telehealth: Payer: Self-pay

## 2015-07-05 NOTE — Telephone Encounter (Signed)
His Lantus rx was called in as a 45 day supply, it needs to be a 90 day supply.

## 2015-07-06 MED ORDER — INSULIN GLARGINE 100 UNIT/ML ~~LOC~~ SOLN: 45.0000 [IU] | Freq: Every day | SUBCUTANEOUS | Status: DC

## 2015-07-16 ENCOUNTER — Other Ambulatory Visit: Payer: Self-pay | Admitting: Family Medicine

## 2015-07-17 DIAGNOSIS — F1020 Alcohol dependence, uncomplicated: Secondary | ICD-10-CM | POA: Insufficient documentation

## 2015-07-17 DIAGNOSIS — G259 Extrapyramidal and movement disorder, unspecified: Secondary | ICD-10-CM | POA: Insufficient documentation

## 2015-07-19 ENCOUNTER — Other Ambulatory Visit: Payer: Self-pay

## 2015-07-19 MED ORDER — METOPROLOL TARTRATE 25 MG PO TABS: 25.0000 mg | ORAL_TABLET | Freq: Two times a day (BID) | ORAL | Status: DC

## 2015-07-19 MED ORDER — CITALOPRAM HYDROBROMIDE 10 MG PO TABS: 10.0000 mg | ORAL_TABLET | Freq: Every day | ORAL | Status: DC

## 2015-07-25 DIAGNOSIS — R05 Cough: Secondary | ICD-10-CM | POA: Diagnosis not present

## 2015-07-25 DIAGNOSIS — R918 Other nonspecific abnormal finding of lung field: Secondary | ICD-10-CM | POA: Diagnosis not present

## 2015-07-25 DIAGNOSIS — J449 Chronic obstructive pulmonary disease, unspecified: Secondary | ICD-10-CM | POA: Diagnosis not present

## 2015-07-25 DIAGNOSIS — R0781 Pleurodynia: Secondary | ICD-10-CM | POA: Diagnosis not present

## 2015-07-25 DIAGNOSIS — J431 Panlobular emphysema: Secondary | ICD-10-CM | POA: Diagnosis not present

## 2015-08-03 ENCOUNTER — Ambulatory Visit: Payer: Self-pay | Admitting: Family Medicine

## 2015-08-09 ENCOUNTER — Ambulatory Visit (INDEPENDENT_AMBULATORY_CARE_PROVIDER_SITE_OTHER): Payer: Medicare Other | Admitting: Family Medicine

## 2015-08-09 ENCOUNTER — Encounter: Payer: Self-pay | Admitting: Family Medicine

## 2015-08-09 VITALS — BP 148/78 | HR 75 | Temp 97.2°F | Ht 69.0 in | Wt 211.0 lb

## 2015-08-09 DIAGNOSIS — J438 Other emphysema: Secondary | ICD-10-CM | POA: Diagnosis not present

## 2015-08-09 DIAGNOSIS — I639 Cerebral infarction, unspecified: Secondary | ICD-10-CM

## 2015-08-09 DIAGNOSIS — I2510 Atherosclerotic heart disease of native coronary artery without angina pectoris: Secondary | ICD-10-CM

## 2015-08-09 DIAGNOSIS — B353 Tinea pedis: Secondary | ICD-10-CM | POA: Diagnosis not present

## 2015-08-09 DIAGNOSIS — E785 Hyperlipidemia, unspecified: Secondary | ICD-10-CM | POA: Diagnosis not present

## 2015-08-09 DIAGNOSIS — E119 Type 2 diabetes mellitus without complications: Secondary | ICD-10-CM | POA: Diagnosis not present

## 2015-08-09 DIAGNOSIS — G259 Extrapyramidal and movement disorder, unspecified: Secondary | ICD-10-CM | POA: Diagnosis not present

## 2015-08-09 DIAGNOSIS — K7030 Alcoholic cirrhosis of liver without ascites: Secondary | ICD-10-CM

## 2015-08-09 MED ORDER — OMEPRAZOLE 20 MG PO CPDR: 20.0000 mg | DELAYED_RELEASE_CAPSULE | Freq: Every day | ORAL | Status: DC

## 2015-08-09 MED ORDER — AMLODIPINE BESYLATE 10 MG PO TABS: 10.0000 mg | ORAL_TABLET | Freq: Every day | ORAL | Status: DC

## 2015-08-09 MED ORDER — INSULIN ASPART 100 UNIT/ML FLEXPEN: PEN_INJECTOR | SUBCUTANEOUS | Status: DC

## 2015-08-09 MED ORDER — BENAZEPRIL HCL 20 MG PO TABS: 20.0000 mg | ORAL_TABLET | Freq: Every day | ORAL | Status: DC

## 2015-08-09 MED ORDER — CITALOPRAM HYDROBROMIDE 10 MG PO TABS: 10.0000 mg | ORAL_TABLET | Freq: Every day | ORAL | Status: DC

## 2015-08-09 MED ORDER — METOPROLOL TARTRATE 25 MG PO TABS: 25.0000 mg | ORAL_TABLET | Freq: Two times a day (BID) | ORAL | Status: DC

## 2015-08-09 NOTE — Progress Notes (Signed)
BP 148/78 mmHg  Pulse 75  Temp(Src) 97.2 F (36.2 C)  Ht '5\' 9"'$  (1.753 m)  Wt 211 lb (95.709 kg)  BMI 31.15 kg/m2  SpO2 96%   Subjective:    Patient ID: Marvin Lewis, male    DOB: [birth year], 64 y.o.   MRN: [id number]  HPI: Marvin Lewis is a 64 y.o. male  Chief Complaint  Patient presents with  . Hypertension  . Diabetes  . Hyperlipidemia   High blood pressure; up just a bit today because I wrote 15 day supply on last beta-blocker; he is trying to stay away from salt; does not add salt to his food; he does not use decongestants; he had been sneezing for a week  He saw the neurologist and he started him on cholesterol medicine, about 6 weeks ago; no side effects; no aches; he does not go back to see the neurologist; would like me to manage that medicine (the statin); limiting saturated fats; bacon or sausage just once a week  Diabetes: last A1C 6.1 in June; checking twice a week, always good readings; uses insulin only if needed; only used it three times in the last two months; that's with dietary indiscretion; no numbness or burning in the feet; last eye exam a little over a year ago; he will go, just got a notice from them; he will get a flu shot this fall; he refuses any more pneumonia vaccines  He does not think he needs his citalopram any more; no problems with mood  He does need the heartburn medicine he explains  Relevant past medical, surgical, family and social history reviewed and updated as indicated. Interim medical history since our last visit reviewed. Allergies and medications reviewed and updated.  Review of Systems  Per HPI unless specifically indicated above     Objective:    BP 148/78 mmHg  Pulse 75  Temp(Src) 97.2 F (36.2 C)  Ht '5\' 9"'$  (1.753 m)  Wt 211 lb (95.709 kg)  BMI 31.15 kg/m2  SpO2 96%  Wt Readings from Last 3 Encounters:  08/09/15 211 lb (95.709 kg)  05/03/15 211 lb (95.709 kg)  06/14/15 205 lb (92.987 kg)    Physical  Exam  Constitutional: He appears well-developed and well-nourished. No distress.  HENT:  Head: Normocephalic and atraumatic.  Eyes: EOM are normal. No scleral icterus.  Neck: No thyromegaly present.  Cardiovascular: Normal rate and regular rhythm.   Pulmonary/Chest: Effort normal and breath sounds normal.  Abdominal: Soft. Bowel sounds are normal. He exhibits no distension. There is no tenderness.  Musculoskeletal: He exhibits no edema.  Neurological: He displays tremor. He displays no atrophy. He exhibits normal muscle tone. Coordination normal.  Skin: Skin is warm and dry. Rash (mild erythema and scale along plantar surface and sides of both feet) noted. No ecchymosis noted. No pallor.  Psychiatric: He has a normal mood and affect. His speech is normal and behavior is normal. Judgment and thought content normal.   Diabetic Foot Form - Detailed   Diabetic Foot Exam - detailed  Diabetic Foot exam was performed with the following findings:  Yes 08/09/2015 11:36 AM  Visual Foot Exam completed.:  Yes  Are the shoes appropriate in style and fit?:  Yes  Are the toenails long?:  No  Are the toenails thick?:  No  Are the toenails ingrown?:  No    Pulse Foot Exam completed.:  Yes  Right Dorsalis Pedis:  Present Left Dorsalis Pedis:  Present  Sensory  Foot Exam Completed.:  Yes  Swelling:  No  Semmes-Weinstein Monofilament Test  R Site 1-Great Toe:  Pos L Site 1-Great Toe:  Pos  R Site 4:  Pos L Site 4:  Pos  R Site 5:  Pos L Site 5:  Pos       Results for orders placed or performed in visit on 06/14/15  Magnesium  Result Value Ref Range   Magnesium 2.0 1.6 - 2.3 mg/dL  Comprehensive metabolic panel  Result Value Ref Range   Glucose 63 (L) 65 - 99 mg/dL   BUN 32 (H) 8 - 27 mg/dL   Creatinine, Ser 1.24 0.76 - 1.27 mg/dL   GFR calc non Af Amer 61 >59 mL/min/1.73   GFR calc Af Amer 71 >59 mL/min/1.73   BUN/Creatinine Ratio 26 (H) 10 - 22   Sodium 138 134 - 144 mmol/L   Potassium 5.0  3.5 - 5.2 mmol/L   Chloride 99 97 - 108 mmol/L   CO2 22 18 - 29 mmol/L   Calcium 9.5 8.6 - 10.2 mg/dL   Total Protein 6.4 6.0 - 8.5 g/dL   Albumin 3.8 3.6 - 4.8 g/dL   Globulin, Total 2.6 1.5 - 4.5 g/dL   Albumin/Globulin Ratio 1.5 1.1 - 2.5   Bilirubin Total 0.4 0.0 - 1.2 mg/dL   Alkaline Phosphatase 106 39 - 117 IU/L   AST 31 0 - 40 IU/L   ALT 32 0 - 44 IU/L  CBC with Differential/Platelet  Result Value Ref Range   WBC 10.7 3.4 - 10.8 x10E3/uL   RBC 4.40 4.14 - 5.80 x10E6/uL   Hemoglobin 12.5 (L) 12.6 - 17.7 g/dL   Hematocrit 37.7 37.5 - 51.0 %   MCV 86 79 - 97 fL   MCH 28.4 26.6 - 33.0 pg   MCHC 33.2 31.5 - 35.7 g/dL   RDW 15.4 12.3 - 15.4 %   Platelets 209 150 - 379 x10E3/uL   Neutrophils 73 %   Lymphs 19 %   Monocytes 5 %   Eos 3 %   Basos 0 %   Neutrophils Absolute 7.8 (H) 1.4 - 7.0 x10E3/uL   Lymphocytes Absolute 2.0 0.7 - 3.1 x10E3/uL   Monocytes Absolute 0.5 0.1 - 0.9 x10E3/uL   EOS (ABSOLUTE) 0.3 0.0 - 0.4 x10E3/uL   Basophils Absolute 0.0 0.0 - 0.2 x10E3/uL   Immature Granulocytes 0 %   Immature Grans (Abs) 0.0 0.0 - 0.1 x10E3/uL      Assessment & Plan:   Problem List Items Addressed This Visit      Cardiovascular and Mediastinum   CAD (coronary artery disease)    On aspirin, beta-blocker, and statin      Relevant Medications   atorvastatin (LIPITOR) 10 MG tablet   metoprolol tartrate (LOPRESSOR) 25 MG tablet   benazepril (LOTENSIN) 20 MG tablet   amLODipine (NORVASC) 10 MG tablet   Other Relevant Orders   Lipid Panel w/o Chol/HDL Ratio     Respiratory   COPD (chronic obstructive pulmonary disease)    Follow with pulmonologist        Digestive   Cirrhosis    Follow with hepatologist      Relevant Orders   CBC with Differential/Platelet   Comprehensive metabolic panel     Endocrine   Diabetes mellitus without complication - Primary    Last A1C showed very good control; will recheck labs for A1C and lipids in 5 weeks; check feet every  night; patient to make appt  with his eye doctor; foot exam in office by MD today      Relevant Medications   atorvastatin (LIPITOR) 10 MG tablet   insulin aspart (NOVOLOG) 100 UNIT/ML FlexPen   benazepril (LOTENSIN) 20 MG tablet   Other Relevant Orders   Hgb A1c w/o eAG     Nervous and Auditory   Movement disorder    Appreciate evaluation by neurologist        Musculoskeletal and Integument   Athletes foot    Start back on the athlete's foot medicine and use daily for one month or until clear        Other   Hyperlipidemia    Started on statin; will check lipids and sgpt at f/u      Relevant Medications   atorvastatin (LIPITOR) 10 MG tablet   metoprolol tartrate (LOPRESSOR) 25 MG tablet   benazepril (LOTENSIN) 20 MG tablet   amLODipine (NORVASC) 10 MG tablet      Follow up plan: Return in about 5 weeks (around 09/13/2015) for fasting labs, and 4 months with Dr. Sanda Klein.  An after-visit summary was printed and given to the patient at [facility].  Please see the patient instructions which may contain other information and recommendations beyond what is mentioned above in the assessment and plan.

## 2015-08-09 NOTE — Assessment & Plan Note (Signed)
Start back on the athlete's foot medicine and use daily for one month or until clear

## 2015-08-09 NOTE — Assessment & Plan Note (Addendum)
Last A1C showed very good control; will recheck labs for A1C and lipids in 5 weeks; check feet every night; patient to make appt with his eye doctor; foot exam in office by MD today

## 2015-08-09 NOTE — Patient Instructions (Addendum)
Try to use PLAIN allergy medicine without the decongestant Avoid: phenylephrine, phenylpropanolamine, and pseudoephredine Plain zyrtec is fine Try to follow the DASH guidelines Return in 5 weeks for fasting labs only Return in 4 months for next visit Do call your eye doctor to schedule an appointment please Check your feet every night Do start the athlete's foot medicine and use for a month until it's completely gone Continue your other medicines except we'll stop the citalopram; take just half of a pill daily for 10 days and then just stop that altogether Do call me right away or seek medical attention for dark thoughts or worsening mood

## 2015-08-11 NOTE — Assessment & Plan Note (Signed)
Follow with pulmonologist

## 2015-08-11 NOTE — Assessment & Plan Note (Signed)
Appreciate evaluation by neurologist

## 2015-08-11 NOTE — Assessment & Plan Note (Signed)
Follow with hepatologist

## 2015-08-11 NOTE — Assessment & Plan Note (Signed)
On aspirin, beta-blocker, and statin

## 2015-08-11 NOTE — Assessment & Plan Note (Signed)
Started on statin; will check lipids and sgpt at f/u

## 2015-09-13 ENCOUNTER — Other Ambulatory Visit: Payer: Medicare Other

## 2015-09-27 ENCOUNTER — Other Ambulatory Visit (INDEPENDENT_AMBULATORY_CARE_PROVIDER_SITE_OTHER): Payer: Medicare Other

## 2015-09-27 ENCOUNTER — Other Ambulatory Visit: Payer: Self-pay

## 2015-09-27 DIAGNOSIS — Z23 Encounter for immunization: Secondary | ICD-10-CM | POA: Diagnosis not present

## 2015-09-27 DIAGNOSIS — E119 Type 2 diabetes mellitus without complications: Secondary | ICD-10-CM

## 2015-09-27 DIAGNOSIS — K7030 Alcoholic cirrhosis of liver without ascites: Secondary | ICD-10-CM

## 2015-09-27 DIAGNOSIS — I2510 Atherosclerotic heart disease of native coronary artery without angina pectoris: Secondary | ICD-10-CM

## 2015-09-27 NOTE — Addendum Note (Signed)
Addended by: Moss Mc on: 09/27/2015 08:24 AM   Modules accepted: Orders

## 2015-09-28 LAB — CBC WITH DIFFERENTIAL/PLATELET
BASOS: 1 %
Basophils Absolute: 0.1 10*3/uL (ref 0.0–0.2)
EOS (ABSOLUTE): 0.4 10*3/uL (ref 0.0–0.4)
EOS: 5 %
HEMATOCRIT: 38 % (ref 37.5–51.0)
HEMOGLOBIN: 12.4 g/dL — AB (ref 12.6–17.7)
IMMATURE GRANS (ABS): 0 10*3/uL (ref 0.0–0.1)
IMMATURE GRANULOCYTES: 0 %
LYMPHS: 27 %
Lymphocytes Absolute: 2.4 10*3/uL (ref 0.7–3.1)
MCH: 28.6 pg (ref 26.6–33.0)
MCHC: 32.6 g/dL (ref 31.5–35.7)
MCV: 88 fL (ref 79–97)
MONOCYTES: 7 %
Monocytes Absolute: 0.6 10*3/uL (ref 0.1–0.9)
NEUTROS ABS: 5.5 10*3/uL (ref 1.4–7.0)
NEUTROS PCT: 60 %
PLATELETS: 149 10*3/uL — AB (ref 150–379)
RBC: 4.34 x10E6/uL (ref 4.14–5.80)
RDW: 15.1 % (ref 12.3–15.4)
WBC: 9 10*3/uL (ref 3.4–10.8)

## 2015-09-28 LAB — COMPREHENSIVE METABOLIC PANEL
ALBUMIN: 4.2 g/dL (ref 3.6–4.8)
ALT: 35 IU/L (ref 0–44)
AST: 38 IU/L (ref 0–40)
Albumin/Globulin Ratio: 1.7 (ref 1.1–2.5)
Alkaline Phosphatase: 99 IU/L (ref 39–117)
BUN / CREAT RATIO: 29 — AB (ref 10–22)
BUN: 34 mg/dL — AB (ref 8–27)
Bilirubin Total: 0.3 mg/dL (ref 0.0–1.2)
CO2: 20 mmol/L (ref 18–29)
CREATININE: 1.16 mg/dL (ref 0.76–1.27)
Calcium: 9.2 mg/dL (ref 8.6–10.2)
Chloride: 105 mmol/L (ref 97–106)
GFR, EST AFRICAN AMERICAN: 77 mL/min/{1.73_m2} (ref >59)
GFR, EST NON AFRICAN AMERICAN: 67 mL/min/{1.73_m2} (ref >59)
GLOBULIN, TOTAL: 2.5 g/dL (ref 1.5–4.5)
GLUCOSE: 85 mg/dL (ref 65–99)
Potassium: 4.6 mmol/L (ref 3.5–5.2)
SODIUM: 143 mmol/L (ref 136–144)
TOTAL PROTEIN: 6.7 g/dL (ref 6.0–8.5)

## 2015-09-28 LAB — HGB A1C W/O EAG: Hgb A1c MFr Bld: 7.1 % — ABNORMAL HIGH (ref 4.8–5.6)

## 2015-09-28 LAB — CHOLESTEROL, TOTAL: Cholesterol, Total: 148 mg/dL (ref 100–199)

## 2015-10-02 ENCOUNTER — Encounter: Payer: Self-pay | Admitting: Family Medicine

## 2015-10-10 DIAGNOSIS — S92355A Nondisplaced fracture of fifth metatarsal bone, left foot, initial encounter for closed fracture: Secondary | ICD-10-CM

## 2015-10-10 HISTORY — DX: Nondisplaced fracture of fifth metatarsal bone, left foot, initial encounter for closed fracture: S92.355A

## 2015-10-23 DIAGNOSIS — R0609 Other forms of dyspnea: Secondary | ICD-10-CM | POA: Diagnosis not present

## 2015-10-23 DIAGNOSIS — G8929 Other chronic pain: Secondary | ICD-10-CM | POA: Diagnosis not present

## 2015-10-23 DIAGNOSIS — Z720 Tobacco use: Secondary | ICD-10-CM | POA: Diagnosis not present

## 2015-10-23 DIAGNOSIS — J3800 Paralysis of vocal cords and larynx, unspecified: Secondary | ICD-10-CM | POA: Diagnosis not present

## 2015-10-23 DIAGNOSIS — R0789 Other chest pain: Secondary | ICD-10-CM | POA: Diagnosis not present

## 2015-10-23 DIAGNOSIS — J431 Panlobular emphysema: Secondary | ICD-10-CM | POA: Diagnosis not present

## 2015-10-27 ENCOUNTER — Ambulatory Visit: Payer: Medicare Other

## 2015-10-30 ENCOUNTER — Emergency Department
Admission: EM | Admit: 2015-10-30 | Discharge: 2015-10-30 | Disposition: A | Discharge status: Home / Self Care | Payer: Medicare Other | Attending: Emergency Medicine | Admitting: Emergency Medicine

## 2015-10-30 ENCOUNTER — Encounter: Payer: Self-pay | Admitting: *Deleted

## 2015-10-30 ENCOUNTER — Emergency Department: Payer: Medicare Other

## 2015-10-30 DIAGNOSIS — Z7982 Long term (current) use of aspirin: Secondary | ICD-10-CM | POA: Diagnosis not present

## 2015-10-30 DIAGNOSIS — S9032XA Contusion of left foot, initial encounter: Secondary | ICD-10-CM | POA: Diagnosis not present

## 2015-10-30 DIAGNOSIS — E119 Type 2 diabetes mellitus without complications: Secondary | ICD-10-CM | POA: Insufficient documentation

## 2015-10-30 DIAGNOSIS — Y998 Other external cause status: Secondary | ICD-10-CM | POA: Diagnosis not present

## 2015-10-30 DIAGNOSIS — W1842XA Slipping, tripping and stumbling without falling due to stepping into hole or opening, initial encounter: Secondary | ICD-10-CM | POA: Insufficient documentation

## 2015-10-30 DIAGNOSIS — Y9301 Activity, walking, marching and hiking: Secondary | ICD-10-CM | POA: Insufficient documentation

## 2015-10-30 DIAGNOSIS — S99922A Unspecified injury of left foot, initial encounter: Secondary | ICD-10-CM | POA: Diagnosis present

## 2015-10-30 DIAGNOSIS — S92355A Nondisplaced fracture of fifth metatarsal bone, left foot, initial encounter for closed fracture: Secondary | ICD-10-CM | POA: Diagnosis not present

## 2015-10-30 DIAGNOSIS — F17210 Nicotine dependence, cigarettes, uncomplicated: Secondary | ICD-10-CM | POA: Diagnosis not present

## 2015-10-30 DIAGNOSIS — I10 Essential (primary) hypertension: Secondary | ICD-10-CM | POA: Diagnosis not present

## 2015-10-30 DIAGNOSIS — Y9289 Other specified places as the place of occurrence of the external cause: Secondary | ICD-10-CM | POA: Diagnosis not present

## 2015-10-30 DIAGNOSIS — Z794 Long term (current) use of insulin: Secondary | ICD-10-CM | POA: Diagnosis not present

## 2015-10-30 DIAGNOSIS — Z79899 Other long term (current) drug therapy: Secondary | ICD-10-CM | POA: Insufficient documentation

## 2015-10-30 MED ORDER — HYDROCODONE-ACETAMINOPHEN 5-325 MG PO TABS
1.0000 | ORAL_TABLET | Freq: Once | ORAL | Status: AC
  Administered 2015-10-30: 1 via ORAL
  Filled 2015-10-30: qty 1

## 2015-10-30 MED ORDER — HYDROCODONE-ACETAMINOPHEN 5-325 MG PO TABS: 1.0000 | ORAL_TABLET | ORAL | Status: DC | PRN

## 2015-10-30 NOTE — ED Notes (Signed)
Pt stepped in a hole and twisted left foot, pt complains of left foot pain

## 2015-10-30 NOTE — ED Notes (Signed)
Stepped in a hole and twisted left foot on Friday having pain to lateral aspect of left foot positive swelling noted

## 2015-10-30 NOTE — Discharge Instructions (Signed)
Metatarsal Fracture A metatarsal fracture is a break in a metatarsal bone. Metatarsal bones connect your toe bones to your ankle bones. CAUSES This type of fracture may be caused by:  A sudden twisting of your foot.  A fall onto your foot.  Overuse or repetitive exercise. RISK FACTORS This condition is more likely to develop in people who:  Play contact sports.  Have a bone disease.  Have a low calcium level. SYMPTOMS Symptoms of this condition include:  Pain that is worse when walking or standing.  Pain when pressing on the foot or moving the toes.  Swelling.  Bruising on the top or bottom of the foot.  A foot that appears shorter than the other one. DIAGNOSIS This condition is diagnosed with a physical exam. You may also have imaging tests, such as:  X-rays.  A CT scan.  MRI. TREATMENT Treatment for this condition depends on its severity and whether a bone has moved out of place. Treatment may involve:  Rest.  Wearing foot support such as a cast, splint, or boot for several weeks.  Using crutches.  Surgery to move bones back into the right position. Surgery is usually needed if there are many pieces of broken bone or bones that are very out of place (displaced fracture).  Physical therapy. This may be needed to help you regain full movement and strength in your foot. You will need to return to your health care provider to have X-rays taken until your bones heal. Your health care provider will look at the X-rays to make sure that your foot is healing well. HOME CARE INSTRUCTIONS  If You Have a Cast:  Do not stick anything inside the cast to scratch your skin. Doing that increases your risk of infection.  Check the skin around the cast every day. Report any concerns to your health care provider. You may put lotion on dry skin around the edges of the cast. Do not apply lotion to the skin underneath the cast.  Keep the cast clean and dry. If You Have a Splint  or a Supportive Boot:  Wear it as directed by your health care provider. Remove it only as directed by your health care provider.  Loosen it if your toes become numb and tingle, or if they turn cold and blue.  Keep it clean and dry. Bathing  Do not take baths, swim, or use a hot tub until your health care provider approves. Ask your health care provider if you can take showers. You may only be allowed to take sponge baths for bathing.  If your health care provider approves bathing and showering, cover the cast or splint with a watertight plastic bag to protect it from water. Do not let the cast or splint get wet. Managing Pain, Stiffness, and Swelling  If directed, apply ice to the injured area (if you have a splint, not a cast).  Put ice in a plastic bag.  Place a towel between your skin and the bag.  Leave the ice on for 20 minutes, 2-3 times per day.  Move your toes often to avoid stiffness and to lessen swelling.  Raise (elevate) the injured area above the level of your heart while you are sitting or lying down. Driving  Do not drive or operate heavy machinery while taking pain medicine.  Do not drive while wearing foot support on a foot that you use for driving. Activity  Return to your normal activities as directed by your health care   provider. Ask your health care provider what activities are safe for you.  Perform exercises as directed by your health care provider or physical therapist. Safety  Do not use the injured foot to support your body weight until your health care provider says that you can. Use crutches as directed by your health care provider. General Instructions  Do not put pressure on any part of the cast or splint until it is fully hardened. This may take several hours.  Do not use any tobacco products, including cigarettes, chewing tobacco, or e-cigarettes. Tobacco can delay bone healing. If you need help quitting, ask your health care  provider.  Take medicines only as directed by your health care provider.  Keep all follow-up visits as directed by your health care provider. This is important. SEEK MEDICAL CARE IF:  You have a fever.  Your cast, splint, or boot is too loose or too tight.  Your cast, splint, or boot is damaged.  Your pain medicine is not helping.  You have pain, tingling, or numbness in your foot that is not going away. SEEK IMMEDIATE MEDICAL CARE IF:  You have severe pain.  You have tingling or numbness in your foot that is getting worse.  Your foot feels cold or becomes numb.  Your foot changes color.   This information is not intended to replace advice given to you by your health care provider. Make sure you discuss any questions you have with your health care provider.   Document Released: 08/17/2002 Document Revised: 04/11/2015 Document Reviewed: 09/21/2014 Elsevier Interactive Patient Education 2016 [employer]  

## 2015-10-30 NOTE — ED Provider Notes (Signed)
[facility] Emergency Department Provider Note  ____________________________________________  Time seen: Approximately 10:34 AM  I have reviewed the triage vital signs and the nursing notes.   HISTORY  Chief Complaint Foot Pain   HPI Marvin Lewis is a 64 y.o. male is here with complaint of left foot pain since Friday. Patient states 3 days ago he was out walking when he twisted his foot stepping into a hole in the yard. He states that he did hear a snap and has had pain in his left foot since. He denies any other injuries to his foot. He has been taking over-the-counter medication and ice and elevating without any relief. He states pain is increased with walking. His pain is 5 out of 10. He denies any head injury or loss of consciousness during this incident.   Past Medical History  Diagnosis Date  . Pneumonia   . GERD (gastroesophageal reflux disease)   . Depression   . Ascending aortic aneurysm ([place])   . Ascending aortic aneurysm ([place])   . CAD (coronary artery disease)   . Hepatitis C   . Cirrhosis ([place])   . Thrombocytopenia ([place])   . Alcohol abuse   . History of smoking   . Onychogryphosis   . Insomnia   . Hypoxemia   . 1St degree AV block   . Broken rib April 2016    History of broken ribs x 3  . History of diverticulitis     ruptured and sepsis  . Diabetes mellitus without complication ([place])   . Alcohol use ([place])   . Chronic alcoholism ([place])   . Movement disorder   . COPD (chronic obstructive pulmonary disease) ([place])   . Emphysema of lung ([place])   . Hyperlipidemia   . Hypertension   . Hip arthritis     Patient Active Problem List   Diagnosis Date Noted  . Athletes foot 08/09/2015  . Chronic alcoholism ([place])   . Movement disorder   . COPD (chronic obstructive pulmonary disease) ([place])   . Emphysema of lung ([place])   . Diabetes mellitus without complication ([place])   . Hyperlipidemia   . Hypertension   . CAD (coronary artery  disease)   . Hepatitis C   . Cirrhosis ([place])   . Alcohol abuse   . Onychogryphosis   . Insomnia   . Hip arthritis   . Hypoxemia   . 1St degree AV block     Past Surgical History  Procedure Laterality Date  . Peg placement N/A   . Peg tube removal N/A   . Tracheostomy N/A   . Cholecystectomy    . Pilonidal cyst excision N/A   . Hernia repair      Current Outpatient Rx  Name  Route  Sig  Dispense  Refill  . Alum & Mag Hydroxide-Simeth (MAGIC MOUTHWASH) SOLN   Oral   Take 5 mLs by mouth 4 (four) times daily as needed for mouth pain.         Marland Kitchen amLODipine (NORVASC) 10 MG tablet   Oral   Take 1 tablet (10 mg total) by mouth daily.   30 tablet   6   . aspirin EC 81 MG tablet   Oral   Take 81 mg by mouth daily.         Marland Kitchen atorvastatin (LIPITOR) 10 MG tablet   Oral   Take 10 mg by mouth at bedtime.         . B-D INS  SYRINGE 0.5CC/30GX1/2" 30G X 1/2" 0.5 ML MISC      AS DIRECTED.   100 each   12   . benazepril (LOTENSIN) 20 MG tablet   Oral   Take 1 tablet (20 mg total) by mouth daily. Avoid salt substitutes; avoid fruit smoothies   30 tablet   3   . budesonide-formoterol (SYMBICORT) 160-4.5 MCG/ACT inhaler   Inhalation   Inhale 2 puffs into the lungs 2 (two) times daily. Patient not taking: Reported on 06/14/2015   1 Inhaler   12   . HYDROcodone-acetaminophen (NORCO/VICODIN) 5-325 MG tablet   Oral   Take 1 tablet by mouth every 4 (four) hours as needed for moderate pain.   20 tablet   0   . insulin aspart (NOVOLOG) 100 UNIT/ML FlexPen      Sliding scale Sub-Q as needed; 200-249 give 2 units, 250-299 give 4 units, etc.         . insulin glargine (LANTUS) 100 UNIT/ML injection   Subcutaneous   Inject 0.45 mLs (45 Units total) into the skin at bedtime. Or as directed   4 vial   1     4050 units = 3 month supply   . ipratropium-albuterol (DUONEB) 0.5-2.5 (3) MG/3ML SOLN   Nebulization   Take 3 mLs by nebulization every 6 (six) hours as needed.          . metoprolol tartrate (LOPRESSOR) 25 MG tablet   Oral   Take 1 tablet (25 mg total) by mouth 2 (two) times daily.   60 tablet   6     I should have written this for 60 pills last time; ...   . omeprazole (PRILOSEC) 20 MG capsule   Oral   Take 1 capsule (20 mg total) by mouth daily.   30 capsule   6     Allergies Review of patient's allergies indicates no known allergies.  Family History  Problem Relation Age of Onset  . Coronary artery disease Mother   . Hypertension Mother   . Heart disease Mother   . Lung disease Mother   . Coronary artery disease Father   . Hypertension Father   . Diabetes Father   . Heart disease Father   . Cancer Father     prostate  . Alcohol abuse Brother     Social History Social History  Substance Use Topics  . Smoking status: Current Every Day Smoker -- 0.25 packs/day for 40 years    Types: Cigarettes  . Smokeless tobacco: Never Used  . Alcohol Use: 1.8 oz/week    3 Glasses of wine per week     Comment: patient states he no longer drinks liquor    Review of Systems Constitutional: No fever/chills Eyes: No visual changes. ENT: No trauma Cardiovascular: Denies chest pain. Respiratory: Denies shortness of breath. Gastrointestinal: No abdominal pain.  No nausea, no vomiting.  Musculoskeletal: Positive for left ankle/foot pain. Skin: Negative for rash. Neurological: Negative for headaches, focal weakness or numbness.  10-point ROS otherwise negative.  ____________________________________________   PHYSICAL EXAM:  VITAL SIGNS: ED Triage Vitals  Enc Vitals Group     BP 10/30/15 1009 142/64 mmHg     Pulse Rate 10/30/15 1009 69     Resp 10/30/15 1009 18     Temp 10/30/15 1009 97.6 F (36.4 C)     Temp Source 10/30/15 1009 Oral     SpO2 10/30/15 1009 97 %     Weight 10/30/15 1009 220 lb (  99.791 kg)     Height 10/30/15 1009 '5\' 10"'$  (1.778 m)     Head Cir --      Peak Flow --      Pain Score 10/30/15 1006 5     Pain  Loc --      Pain Edu? --      Excl. in [place]? --     Constitutional: Alert and oriented. Well appearing and in no acute distress. Eyes: Conjunctivae are normal. PERRL. EOMI. Head: Atraumatic. Nose: No congestion/rhinnorhea. Neck: No stridor.   Cardiovascular: Normal rate, regular rhythm. Grossly normal heart sounds.  Good peripheral circulation. Respiratory: Normal respiratory effort.  No retractions. Lungs CTAB. Gastrointestinal: Soft and nontender. No distention. Musculoskeletal: Moderate tenderness on palpation of the left lateral foot. There is moderate edema present. Motor sensory function intact distal to the injury. Neurologic:  Normal speech and language. No gross focal neurologic deficits are appreciated. No gait instability with the use of a cane. Skin:  Skin is warm, dry and intact. No rash noted. There is moderate ecchymosis of left lateral foot dorsal aspect. Skin is intact. Psychiatric: Mood and affect are normal. Speech and behavior are normal.  ____________________________________________   LABS (all labs ordered are listed, but only abnormal results are displayed)  Labs Reviewed - No data to display RADIOLOGY  Left foot per radiologist nondisplaced fracture of the base of the left fifth metatarsal. I, Johnn Hai, personally viewed and evaluated these images (plain radiographs) as part of my medical decision making.  ____________________________________________   PROCEDURES  Procedure(s) performed: None  Critical Care performed: No  ____________________________________________   INITIAL IMPRESSION / ASSESSMENT AND PLAN / ED COURSE  Pertinent labs & imaging results that were available during my care of the patient were reviewed by me and considered in my medical decision making (see chart for details)  Patient was placed in Ace wrap and a postop shoe. He is to ice and elevate the foot for edema. Patient is given a prescription for Norco as needed for  severe pain. He is also to follow-up with Dr. Cleda Mccreedy in podiatry for his fracture.   ____________________________________________   FINAL CLINICAL IMPRESSION(S) / ED DIAGNOSES  Final diagnoses:  Nondisplaced fracture of fifth left metatarsal bone, closed, initial encounter      Johnn Hai, PA-C 10/30/15 1435  Lavonia Drafts, MD 10/30/15 1444

## 2015-10-31 ENCOUNTER — Ambulatory Visit: Payer: Medicare Other

## 2015-10-31 DIAGNOSIS — S92355A Nondisplaced fracture of fifth metatarsal bone, left foot, initial encounter for closed fracture: Secondary | ICD-10-CM | POA: Diagnosis not present

## 2015-11-06 ENCOUNTER — Ambulatory Visit
Admission: RE | Admit: 2015-11-06 | Discharge: 2015-11-06 | Disposition: A | Discharge status: Home / Self Care | Payer: Medicare Other | Source: Ambulatory Visit | Attending: Specialist | Admitting: Specialist

## 2015-11-06 DIAGNOSIS — I712 Thoracic aortic aneurysm, without rupture: Secondary | ICD-10-CM | POA: Insufficient documentation

## 2015-11-06 DIAGNOSIS — R911 Solitary pulmonary nodule: Secondary | ICD-10-CM | POA: Insufficient documentation

## 2015-11-08 DIAGNOSIS — J431 Panlobular emphysema: Secondary | ICD-10-CM | POA: Diagnosis not present

## 2015-11-08 DIAGNOSIS — R918 Other nonspecific abnormal finding of lung field: Secondary | ICD-10-CM | POA: Diagnosis not present

## 2015-11-08 DIAGNOSIS — Z720 Tobacco use: Secondary | ICD-10-CM | POA: Diagnosis not present

## 2015-11-21 DIAGNOSIS — M79672 Pain in left foot: Secondary | ICD-10-CM | POA: Diagnosis not present

## 2015-11-21 DIAGNOSIS — S92355D Nondisplaced fracture of fifth metatarsal bone, left foot, subsequent encounter for fracture with routine healing: Secondary | ICD-10-CM | POA: Diagnosis not present

## 2015-12-08 ENCOUNTER — Ambulatory Visit: Payer: Medicare Other | Admitting: Family Medicine

## 2015-12-18 ENCOUNTER — Ambulatory Visit: Payer: Medicare Other | Admitting: Family Medicine

## 2015-12-28 ENCOUNTER — Ambulatory Visit: Payer: Medicare Other | Admitting: Family Medicine

## 2016-01-08 DIAGNOSIS — H2511 Age-related nuclear cataract, right eye: Secondary | ICD-10-CM | POA: Diagnosis not present

## 2016-01-08 LAB — HM DIABETES EYE EXAM

## 2016-01-10 ENCOUNTER — Ambulatory Visit (INDEPENDENT_AMBULATORY_CARE_PROVIDER_SITE_OTHER): Payer: Medicare Other | Admitting: Family Medicine

## 2016-01-10 ENCOUNTER — Other Ambulatory Visit: Payer: Self-pay

## 2016-01-10 VITALS — BP 145/80 | HR 67 | Temp 97.6°F | Ht 70.0 in | Wt 225.0 lb

## 2016-01-10 DIAGNOSIS — B351 Tinea unguium: Secondary | ICD-10-CM | POA: Diagnosis not present

## 2016-01-10 DIAGNOSIS — E785 Hyperlipidemia, unspecified: Secondary | ICD-10-CM | POA: Diagnosis not present

## 2016-01-10 DIAGNOSIS — R490 Dysphonia: Secondary | ICD-10-CM

## 2016-01-10 DIAGNOSIS — I2510 Atherosclerotic heart disease of native coronary artery without angina pectoris: Secondary | ICD-10-CM

## 2016-01-10 DIAGNOSIS — E119 Type 2 diabetes mellitus without complications: Secondary | ICD-10-CM

## 2016-01-10 DIAGNOSIS — I10 Essential (primary) hypertension: Secondary | ICD-10-CM | POA: Diagnosis not present

## 2016-01-10 DIAGNOSIS — B353 Tinea pedis: Secondary | ICD-10-CM

## 2016-01-10 DIAGNOSIS — Z8673 Personal history of transient ischemic attack (TIA), and cerebral infarction without residual deficits: Secondary | ICD-10-CM

## 2016-01-10 DIAGNOSIS — K7030 Alcoholic cirrhosis of liver without ascites: Secondary | ICD-10-CM | POA: Diagnosis not present

## 2016-01-10 DIAGNOSIS — L602 Onychogryphosis: Secondary | ICD-10-CM

## 2016-01-10 DIAGNOSIS — J439 Emphysema, unspecified: Secondary | ICD-10-CM

## 2016-01-10 DIAGNOSIS — Z720 Tobacco use: Secondary | ICD-10-CM | POA: Diagnosis not present

## 2016-01-10 DIAGNOSIS — G259 Extrapyramidal and movement disorder, unspecified: Secondary | ICD-10-CM | POA: Diagnosis not present

## 2016-01-10 NOTE — Patient Instructions (Addendum)
Please do your foot doctor about your athlete's foot infection (fungal infection of the skin, plus toenails) Please do see Dr. Tami Ribas about your hoarse voice; it sounds worse to me and I'd like him to check to make sure there isn't cancer or something else going on since you are a smoker Continue your current medicines Keep trying to lose weight  I've sent a message to Dr. Raul Del and we'll see what he thinks about you receiving the shingles vaccine  Try to limit saturated fats in your diet (bologna, hot dogs, barbeque, cheeseburgers, hamburgers, steak, bacon, sausage, cheese, etc.) and get more fresh fruits, vegetables, and whole grains  Your goal blood pressure is less than 140 mmHg on top. Try to follow the DASH guidelines (DASH stands for Dietary Approaches to Stop Hypertension) Try to limit the sodium in your diet.  Ideally, consume less than 1.5 grams (less than 1,'500mg'$ ) per day. Do not add salt when cooking or at the table.  Check the sodium amount on labels when shopping, and choose items lower in sodium when given a choice. Avoid or limit foods that already contain a lot of sodium. Eat a diet rich in fruits and vegetables and whole grains.  I do encourage you to quit smoking Call [phone] to sign up for smoking cessation classes You can call 1-800-QUIT-NOW to talk with a smoking cessation coach

## 2016-01-10 NOTE — Progress Notes (Signed)
BP 145/80 mmHg  Pulse 67  Temp(Src) 97.6 F (36.4 C)  Ht '5\' 10"'$  (1.778 m)  Wt 225 lb (102.059 kg)  BMI 32.28 kg/m2  SpO2 96%   Subjective:    Patient ID: Marvin Lewis, male    DOB: 02-26-1951, 65 y.o.   MRN: [id number]  HPI: Marvin Lewis is a 65 y.o. male  Chief Complaint  Patient presents with  . Diabetes    routine follow up and labs  . Hyperlipidemia    routine follow up and labs  . Hypertension    routine follow up and labs  . Immunizations    he would like to talk with you about the shingles vaccine   He has had a few sugars in the last month in the 300s, just a few in the 200s; yesterday 100-110 range now that he is really working on his weight; he added five units of Lantus at night; Dr. Golden Pop said it's okay; he broke his left foot and had some immobilization  High cholesterol; tries to limit fatty foods; six eggs a week, advised to cut down to three; not much fatty processed pork once a week; skim milk  High blood pressure; checks BP away from the doctor; usually 130 on top and 75 on the bottom; checks 3 times a week  Hx of hepatitis; Dr. Gustavo Lah sees him for that  He does smoke 4 cigarettes a day; not quite ready to quit  He has a frozen vocal cord; he sees Dr. Tami Ribas, ENT; I explained my concern about his voice, concern about laryngeal cancer, he says no worse,he says he'll follow-up with ENT if worse; trouble with swallowing but that's from "vertebra" that closes when he swallows (valecula?); Dr. Raul Del said he aspirates, had a feeding tube (G-tube 3 years ago)  He has not had a shingles vaccine yet; we talked about who should not get it; he still has pain in the left side; he is getting pain medicine from his lung doctor; right now not hurting; PET scan done June 2016; cancer-free  Saw eye doctor two days ago and getting cataracts out soon, one month in between  Relevant past medical, surgical, family and social history reviewed and  updated as indicated Past Medical History  Diagnosis Date  . Pneumonia   . GERD (gastroesophageal reflux disease)   . Depression   . Ascending aortic aneurysm ([place])   . Ascending aortic aneurysm ([place])   . CAD (coronary artery disease)   . Hepatitis C   . Cirrhosis ([place])   . Thrombocytopenia ([place])   . Alcohol abuse   . History of smoking   . Onychogryphosis   . Insomnia   . Hypoxemia   . 1St degree AV block   . Broken rib April 2016    History of broken ribs x 3  . History of diverticulitis     ruptured and sepsis  . Diabetes mellitus without complication ([place])   . Alcohol use ([place])   . Chronic alcoholism ([place])   . Movement disorder   . COPD (chronic obstructive pulmonary disease) ([place])   . Emphysema of lung ([place])   . Hyperlipidemia   . Hypertension   . Hip arthritis   . Closed nondisplaced fracture of fifth metatarsal bone of left foot Nov. 2016    left   Past Surgical History  Procedure Laterality Date  . Peg placement N/A   . Peg tube removal N/A   . Tracheostomy N/A   .  Cholecystectomy    . Pilonidal cyst excision N/A   . Hernia repair     Interim medical history since our last visit reviewed. Broken left foot  Social History  Substance Use Topics  . Smoking status: Current Every Day Smoker -- 0.25 packs/day for 40 years    Types: Cigarettes  . Smokeless tobacco: Never Used  . Alcohol Use: Yes     Comment: patient states he no longer drinks liquor, states he drinks very little   Allergies and medications reviewed and updated.  Review of Systems Per HPI unless specifically indicated above     Objective:    BP 145/80 mmHg  Pulse 67  Temp(Src) 97.6 F (36.4 C)  Ht '5\' 10"'$  (1.778 m)  Wt 225 lb (102.059 kg)  BMI 32.28 kg/m2  SpO2 96%  Wt Readings from Last 3 Encounters:  01/10/16 225 lb (102.059 kg)  10/30/15 220 lb (99.791 kg)  08/09/15 211 lb (95.709 kg)    Physical Exam  Constitutional: He appears well-developed and well-nourished. No  distress.  Obese, 14 pounds weight gain since August, less than 6 months  HENT:  Head: Normocephalic and atraumatic.  Voice is very hoarse  Eyes: EOM are normal. No scleral icterus.  Neck: No JVD present. No thyromegaly present.  Cardiovascular: Normal rate and regular rhythm.   Pulmonary/Chest: Effort normal. He has wheezes (right more than left, expiratory).  Abdominal: Soft. Bowel sounds are normal. He exhibits no distension.  Surgical scar left of midline above umbilicus suggestive of J-tube placement  Musculoskeletal: He exhibits no edema.  Neurological: Coordination normal.  Jerky movements of head and upper extremities; restless, hyperactive  Skin: Skin is warm and dry. No pallor.  Scale of soles of both feet  Psychiatric: He has a normal mood and affect. His behavior is normal. Judgment and thought content normal.  Good eye contact with examiner; pleasant, jovial   Diabetic Foot Form - Detailed   Diabetic Foot Exam - detailed  Diabetic Foot exam was performed with the following findings:  Yes 01/10/2016  1:44 PM  Visual Foot Exam completed.:  Yes  Are the toenails long?:  Yes  Are the toenails thick?:  Yes  Are the toenails ingrown?:  No    Pulse Foot Exam completed.:  Yes  Right Dorsalis Pedis:  Present Left Dorsalis Pedis:  Present  Sensory Foot Exam Completed.:  Yes  Swelling:  No  Semmes-Weinstein Monofilament Test  R Site 1-Great Toe:  Pos L Site 1-Great Toe:  Pos  R Site 4:  Pos L Site 4:  Pos  R Site 5:  Pos L Site 5:  Pos    Comments:  Tinea pedis bilaterally with scale; toenails thickened and yellow      Results for orders placed or performed in visit on 09/27/15  Comprehensive metabolic panel  Result Value Ref Range   Glucose 85 65 - 99 mg/dL   BUN 34 (H) 8 - 27 mg/dL   Creatinine, Ser 1.16 0.76 - 1.27 mg/dL   GFR calc non Af Amer 67 >59 mL/min/1.73   GFR calc Af Amer 77 >59 mL/min/1.73   BUN/Creatinine Ratio 29 (H) 10 - 22   Sodium 143 136 - 144  mmol/L   Potassium 4.6 3.5 - 5.2 mmol/L   Chloride 105 97 - 106 mmol/L   CO2 20 18 - 29 mmol/L   Calcium 9.2 8.6 - 10.2 mg/dL   Total Protein 6.7 6.0 - 8.5 g/dL   Albumin 4.2 3.6 -  4.8 g/dL   Globulin, Total 2.5 1.5 - 4.5 g/dL   Albumin/Globulin Ratio 1.7 1.1 - 2.5   Bilirubin Total 0.3 0.0 - 1.2 mg/dL   Alkaline Phosphatase 99 39 - 117 IU/L   AST 38 0 - 40 IU/L   ALT 35 0 - 44 IU/L  Cholesterol, total  Result Value Ref Range   Cholesterol, Total 148 100 - 199 mg/dL  Hgb A1c w/o eAG  Result Value Ref Range   Hgb A1c MFr Bld 7.1 (H) 4.8 - 5.6 %  CBC with Differential/Platelet  Result Value Ref Range   WBC 9.0 3.4 - 10.8 x10E3/uL   RBC 4.34 4.14 - 5.80 x10E6/uL   Hemoglobin 12.4 (L) 12.6 - 17.7 g/dL   Hematocrit 38.0 37.5 - 51.0 %   MCV 88 79 - 97 fL   MCH 28.6 26.6 - 33.0 pg   MCHC 32.6 31.5 - 35.7 g/dL   RDW 15.1 12.3 - 15.4 %   Platelets 149 (L) 150 - 379 x10E3/uL   Neutrophils 60 %   Lymphs 27 %   Monocytes 7 %   Eos 5 %   Basos 1 %   Neutrophils Absolute 5.5 1.4 - 7.0 x10E3/uL   Lymphocytes Absolute 2.4 0.7 - 3.1 x10E3/uL   Monocytes Absolute 0.6 0.1 - 0.9 x10E3/uL   EOS (ABSOLUTE) 0.4 0.0 - 0.4 x10E3/uL   Basophils Absolute 0.1 0.0 - 0.2 x10E3/uL   Immature Granulocytes 0 %   Immature Grans (Abs) 0.0 0.0 - 0.1 x10E3/uL      Assessment & Plan:   Problem List Items Addressed This Visit      Cardiovascular and Mediastinum   Hypertension    Well-controlled away from the doctor's office; continue current medications; monitor urine microalbumin and creatinine      CAD (coronary artery disease)    Followed by cardiologist, Dr. Ubaldo Glassing; taking aspirin; no chest pain      Relevant Orders   Lipid Panel w/o Chol/HDL Ratio     Respiratory   Emphysema of lung (HCC)    Followed by Dr. Raul Del; patient not ready to quit      Relevant Medications   albuterol (PROVENTIL) (2.5 MG/3ML) 0.083% nebulizer solution     Digestive   Cirrhosis (HCC)    Followed by Dr.  Gustavo Lah      Relevant Orders   Comprehensive metabolic panel     Endocrine   Diabetes mellitus without complication ([place]) - Primary    Check A1c today; foot exam by MD today; patient to see eye doctor regularly, UTD; see foot doctor regularly; continue meds      Relevant Orders   Hgb A1c w/o eAG   Microalbumin / creatinine urine ratio     Nervous and Auditory   Movement disorder    Seen by neurologist        Musculoskeletal and Integument   Onychogryphosis    Patient to see foot doctor      Athletes foot    Patient will start back on medicine and see his foot doctor      Onychomycosis    Patient to see foot doctor        Other   Hyperlipidemia    Try to get LDL under 70; check today      Relevant Orders   Lipid Panel w/o Chol/HDL Ratio   Hoarseness of voice    His voice sounds hoarser than previous visit; I encouraged him to see his ENT doctor, Dr.  Tami Ribas for this; voiced my concern about possible laryngeal cancer; patient agrees to call ENT and get seen      Tobacco abuse    I encouraged patient to quit smoking; he is not ready; see AVS      Hx of completed stroke    Followed by neurologist, Dr. Brigitte Pulse         Follow up plan: Return in about 3 months (around 04/08/2016) for thirty minute follow-up with fasting labs.  An after-visit summary was printed and given to the patient at [place].  Please see the patient instructions which may contain other information and recommendations beyond what is mentioned above in the assessment and plan. Orders Placed This Encounter  Procedures  . Lipid Panel w/o Chol/HDL Ratio  . Hgb A1c w/o eAG  . Comprehensive metabolic panel  . Microalbumin / creatinine urine ratio   WAITING to hear back from pulmonary doctor about shingles vaccine; do NOT give until his blessing is received

## 2016-01-10 NOTE — Assessment & Plan Note (Signed)
I encouraged patient to quit smoking; he is not ready; see AVS

## 2016-01-10 NOTE — Assessment & Plan Note (Signed)
Followed by neurologist, Dr. Brigitte Pulse

## 2016-01-10 NOTE — Assessment & Plan Note (Signed)
Check A1c today; foot exam by MD today; patient to see eye doctor regularly, UTD; see foot doctor regularly; continue meds

## 2016-01-10 NOTE — Assessment & Plan Note (Signed)
Well-controlled away from the doctor's office; continue current medications; monitor urine microalbumin and creatinine

## 2016-01-10 NOTE — Assessment & Plan Note (Addendum)
Followed by cardiologist, Dr. Ubaldo Glassing; taking aspirin; no chest pain

## 2016-01-10 NOTE — Assessment & Plan Note (Signed)
Followed by Dr. Raul Del; patient not ready to quit

## 2016-01-10 NOTE — Assessment & Plan Note (Signed)
Patient to see foot doctor

## 2016-01-10 NOTE — Telephone Encounter (Signed)
Patient forgot to ask for med refills.

## 2016-01-10 NOTE — Assessment & Plan Note (Signed)
Followed by Dr. Gustavo Lah

## 2016-01-10 NOTE — Assessment & Plan Note (Signed)
Try to get LDL under 70; check today

## 2016-01-10 NOTE — Assessment & Plan Note (Signed)
His voice sounds hoarser than previous visit; I encouraged him to see his ENT doctor, Dr. Tami Ribas for this; voiced my concern about possible laryngeal cancer; patient agrees to call ENT and get seen

## 2016-01-10 NOTE — Assessment & Plan Note (Signed)
Seen by neurologist

## 2016-01-10 NOTE — Assessment & Plan Note (Signed)
Patient will start back on medicine and see his foot doctor

## 2016-01-11 ENCOUNTER — Other Ambulatory Visit: Payer: Self-pay | Admitting: Family Medicine

## 2016-01-11 ENCOUNTER — Other Ambulatory Visit: Payer: Self-pay

## 2016-01-11 DIAGNOSIS — H2511 Age-related nuclear cataract, right eye: Secondary | ICD-10-CM | POA: Diagnosis not present

## 2016-01-11 DIAGNOSIS — E119 Type 2 diabetes mellitus without complications: Secondary | ICD-10-CM | POA: Diagnosis not present

## 2016-01-11 LAB — COMPREHENSIVE METABOLIC PANEL
A/G RATIO: 1.5 (ref 1.1–2.5)
ALBUMIN: 4.3 g/dL (ref 3.6–4.8)
ALT: 36 IU/L (ref 0–44)
AST: 29 IU/L (ref 0–40)
Alkaline Phosphatase: 113 IU/L (ref 39–117)
BILIRUBIN TOTAL: 0.5 mg/dL (ref 0.0–1.2)
BUN / CREAT RATIO: 24 — AB (ref 10–22)
BUN: 30 mg/dL — AB (ref 8–27)
CHLORIDE: 102 mmol/L (ref 96–106)
CO2: 24 mmol/L (ref 18–29)
Calcium: 9.7 mg/dL (ref 8.6–10.2)
Creatinine, Ser: 1.26 mg/dL (ref 0.76–1.27)
GFR calc non Af Amer: 60 mL/min/{1.73_m2} (ref >59)
GFR, EST AFRICAN AMERICAN: 69 mL/min/{1.73_m2} (ref >59)
GLOBULIN, TOTAL: 2.9 g/dL (ref 1.5–4.5)
Glucose: 205 mg/dL — ABNORMAL HIGH (ref 65–99)
POTASSIUM: 5.4 mmol/L — AB (ref 3.5–5.2)
Sodium: 143 mmol/L (ref 134–144)
TOTAL PROTEIN: 7.2 g/dL (ref 6.0–8.5)

## 2016-01-11 LAB — MICROALBUMIN / CREATININE URINE RATIO

## 2016-01-11 LAB — LIPID PANEL W/O CHOL/HDL RATIO
CHOLESTEROL TOTAL: 159 mg/dL (ref 100–199)
HDL: 37 mg/dL — AB (ref >39)
LDL Calculated: 75 mg/dL (ref 0–99)
TRIGLYCERIDES: 234 mg/dL — AB (ref 0–149)
VLDL Cholesterol Cal: 47 mg/dL — ABNORMAL HIGH (ref 5–40)

## 2016-01-11 LAB — PLEASE NOTE

## 2016-01-11 LAB — HGB A1C W/O EAG: Hgb A1c MFr Bld: 9.4 % — ABNORMAL HIGH (ref 4.8–5.6)

## 2016-01-11 NOTE — Progress Notes (Signed)
Please release orders to [facility] for urine

## 2016-01-11 NOTE — Assessment & Plan Note (Signed)
Check urine microalb

## 2016-01-12 ENCOUNTER — Other Ambulatory Visit: Payer: Self-pay | Admitting: Family Medicine

## 2016-01-12 DIAGNOSIS — E875 Hyperkalemia: Secondary | ICD-10-CM

## 2016-01-12 LAB — PLEASE NOTE

## 2016-01-12 LAB — MICROALBUMIN / CREATININE URINE RATIO
Creatinine, Urine: 138.8 mg/dL
MICROALB/CREAT RATIO: 148 mg/g{creat} — AB (ref 0.0–30.0)
Microalbumin, Urine: 205.4 ug/mL

## 2016-01-12 MED ORDER — INSULIN GLARGINE 100 UNIT/ML ~~LOC~~ SOLN: 53.0000 [IU] | Freq: Every day | SUBCUTANEOUS | Status: DC

## 2016-01-12 MED ORDER — INSULIN ASPART 100 UNIT/ML FLEXPEN: PEN_INJECTOR | SUBCUTANEOUS | Status: DC

## 2016-01-12 MED ORDER — BENAZEPRIL HCL 20 MG PO TABS: 20.0000 mg | ORAL_TABLET | Freq: Every day | ORAL | Status: DC

## 2016-01-12 MED ORDER — AMLODIPINE BESYLATE 10 MG PO TABS: 10.0000 mg | ORAL_TABLET | Freq: Every day | ORAL | Status: DC

## 2016-01-12 MED ORDER — ATORVASTATIN CALCIUM 10 MG PO TABS: 10.0000 mg | ORAL_TABLET | Freq: Every day | ORAL | Status: DC

## 2016-01-12 MED ORDER — METOPROLOL TARTRATE 25 MG PO TABS: 25.0000 mg | ORAL_TABLET | Freq: Two times a day (BID) | ORAL | Status: DC

## 2016-01-12 MED ORDER — OMEPRAZOLE 20 MG PO CPDR: 20.0000 mg | DELAYED_RELEASE_CAPSULE | Freq: Every day | ORAL | Status: DC

## 2016-01-12 NOTE — Telephone Encounter (Signed)
Refills sent as requested, with a few changes  Please contact patient with lab results and medicine adjustments  His A1c has gone up; we know that reflects the Thanksgiving and Christmas season, so I hope that his eating habits will improve and he'll work on weight loss like we discussed at his visit; good luck! We know it is easier said that done  Let him know that I'd like to increase his long-acting insulin to FIFTY-THREE units every day I've also increased his sliding scale short-acting insulin a little as follows (make sure he has something to write this down): Check sugars 3x a day before breakfast, lunch, and supper For blood sugar between 200-249, give 3 units   "       "         "          "        250-299, give 5 units   "       "         "          "        300-349, give 7 units   "       "         "          "        350-399, give 9 units   "       "         "          "        400+, give 12 units  His potassium was a little up, so do NOT use any salt substitutes like Morton's Lite salt or No Salt; avoid fruit smoothies; recheck K+ on Feb 15th Thank you!

## 2016-01-12 NOTE — Progress Notes (Signed)
Orders for recheck BMP entered for Feb 15th

## 2016-01-15 ENCOUNTER — Encounter: Payer: Self-pay | Admitting: *Deleted

## 2016-01-16 ENCOUNTER — Encounter
Admission: RE | Disposition: A | Discharge status: Home / Self Care | Payer: Self-pay | Source: Ambulatory Visit | Attending: Ophthalmology

## 2016-01-16 ENCOUNTER — Ambulatory Visit: Payer: Medicare Other | Admitting: Anesthesiology

## 2016-01-16 ENCOUNTER — Encounter: Payer: Self-pay | Admitting: *Deleted

## 2016-01-16 ENCOUNTER — Ambulatory Visit
Admission: RE | Admit: 2016-01-16 | Discharge: 2016-01-16 | Disposition: A | Discharge status: Home / Self Care | Payer: Medicare Other | Source: Ambulatory Visit | Attending: Ophthalmology | Admitting: Ophthalmology

## 2016-01-16 DIAGNOSIS — I440 Atrioventricular block, first degree: Secondary | ICD-10-CM | POA: Diagnosis not present

## 2016-01-16 DIAGNOSIS — E119 Type 2 diabetes mellitus without complications: Secondary | ICD-10-CM | POA: Diagnosis not present

## 2016-01-16 DIAGNOSIS — J449 Chronic obstructive pulmonary disease, unspecified: Secondary | ICD-10-CM | POA: Insufficient documentation

## 2016-01-16 DIAGNOSIS — I10 Essential (primary) hypertension: Secondary | ICD-10-CM | POA: Insufficient documentation

## 2016-01-16 DIAGNOSIS — G4700 Insomnia, unspecified: Secondary | ICD-10-CM | POA: Diagnosis not present

## 2016-01-16 DIAGNOSIS — M1990 Unspecified osteoarthritis, unspecified site: Secondary | ICD-10-CM | POA: Diagnosis not present

## 2016-01-16 DIAGNOSIS — I2510 Atherosclerotic heart disease of native coronary artery without angina pectoris: Secondary | ICD-10-CM | POA: Insufficient documentation

## 2016-01-16 DIAGNOSIS — Z9981 Dependence on supplemental oxygen: Secondary | ICD-10-CM | POA: Diagnosis not present

## 2016-01-16 DIAGNOSIS — E785 Hyperlipidemia, unspecified: Secondary | ICD-10-CM | POA: Insufficient documentation

## 2016-01-16 DIAGNOSIS — Z8673 Personal history of transient ischemic attack (TIA), and cerebral infarction without residual deficits: Secondary | ICD-10-CM | POA: Insufficient documentation

## 2016-01-16 DIAGNOSIS — R0601 Orthopnea: Secondary | ICD-10-CM | POA: Diagnosis not present

## 2016-01-16 DIAGNOSIS — R0602 Shortness of breath: Secondary | ICD-10-CM | POA: Diagnosis not present

## 2016-01-16 DIAGNOSIS — K449 Diaphragmatic hernia without obstruction or gangrene: Secondary | ICD-10-CM | POA: Insufficient documentation

## 2016-01-16 DIAGNOSIS — H2511 Age-related nuclear cataract, right eye: Secondary | ICD-10-CM | POA: Diagnosis not present

## 2016-01-16 DIAGNOSIS — R062 Wheezing: Secondary | ICD-10-CM | POA: Diagnosis not present

## 2016-01-16 DIAGNOSIS — I739 Peripheral vascular disease, unspecified: Secondary | ICD-10-CM | POA: Diagnosis not present

## 2016-01-16 DIAGNOSIS — H9190 Unspecified hearing loss, unspecified ear: Secondary | ICD-10-CM | POA: Diagnosis not present

## 2016-01-16 DIAGNOSIS — Z8619 Personal history of other infectious and parasitic diseases: Secondary | ICD-10-CM | POA: Diagnosis not present

## 2016-01-16 DIAGNOSIS — G4730 Sleep apnea, unspecified: Secondary | ICD-10-CM | POA: Diagnosis not present

## 2016-01-16 DIAGNOSIS — F17200 Nicotine dependence, unspecified, uncomplicated: Secondary | ICD-10-CM | POA: Diagnosis not present

## 2016-01-16 DIAGNOSIS — K5790 Diverticulosis of intestine, part unspecified, without perforation or abscess without bleeding: Secondary | ICD-10-CM | POA: Diagnosis not present

## 2016-01-16 DIAGNOSIS — Z9049 Acquired absence of other specified parts of digestive tract: Secondary | ICD-10-CM | POA: Diagnosis not present

## 2016-01-16 DIAGNOSIS — R05 Cough: Secondary | ICD-10-CM | POA: Diagnosis not present

## 2016-01-16 DIAGNOSIS — I714 Abdominal aortic aneurysm, without rupture: Secondary | ICD-10-CM | POA: Insufficient documentation

## 2016-01-16 HISTORY — DX: Unspecified hearing loss, unspecified ear: H91.90

## 2016-01-16 HISTORY — DX: Personal history of other diseases of the digestive system: Z87.19

## 2016-01-16 HISTORY — PX: CATARACT EXTRACTION W/PHACO: SHX586

## 2016-01-16 HISTORY — DX: Other seasonal allergic rhinitis: J30.2

## 2016-01-16 HISTORY — DX: Bronchitis, not specified as acute or chronic: J40

## 2016-01-16 HISTORY — DX: Perforation of esophagus: K22.3

## 2016-01-16 HISTORY — DX: Cerebral infarction, unspecified: I63.9

## 2016-01-16 HISTORY — DX: Fracture of one rib, unspecified side, initial encounter for closed fracture: S22.39XA

## 2016-01-16 HISTORY — DX: Sleep apnea, unspecified: G47.30

## 2016-01-16 LAB — GLUCOSE, CAPILLARY: Glucose-Capillary: 261 mg/dL — ABNORMAL HIGH (ref 65–99)

## 2016-01-16 SURGERY — PHACOEMULSIFICATION, CATARACT, WITH IOL INSERTION
Anesthesia: Monitor Anesthesia Care | Site: Eye | Laterality: Right | Wound class: Clean

## 2016-01-16 MED ORDER — ARMC OPHTHALMIC DILATING GEL
OPHTHALMIC | Status: AC
  Filled 2016-01-16: qty 0.25

## 2016-01-16 MED ORDER — MOXIFLOXACIN HCL 0.5 % OP SOLN
OPHTHALMIC | Status: DC
Start: 2016-01-16 — End: 2016-01-16
  Filled 2016-01-16: qty 3

## 2016-01-16 MED ORDER — CEFUROXIME OPHTHALMIC INJECTION 1 MG/0.1 ML
INJECTION | OPHTHALMIC | Status: DC | PRN
  Administered 2016-01-16: 1 mg via INTRACAMERAL

## 2016-01-16 MED ORDER — NA CHONDROIT SULF-NA HYALURON 40-17 MG/ML IO SOLN
INTRAOCULAR | Status: DC | PRN
  Administered 2016-01-16: 1 mL via INTRAOCULAR

## 2016-01-16 MED ORDER — SODIUM CHLORIDE 0.9 % IV SOLN
INTRAVENOUS | Status: DC
  Administered 2016-01-16 (×2): via INTRAVENOUS

## 2016-01-16 MED ORDER — NA CHONDROIT SULF-NA HYALURON 40-17 MG/ML IO SOLN
INTRAOCULAR | Status: AC
  Filled 2016-01-16: qty 1

## 2016-01-16 MED ORDER — CEFUROXIME OPHTHALMIC INJECTION 1 MG/0.1 ML
INJECTION | OPHTHALMIC | Status: AC
  Filled 2016-01-16: qty 0.1

## 2016-01-16 MED ORDER — ARMC OPHTHALMIC DILATING GEL
1.0000 "application " | OPHTHALMIC | Status: DC | PRN
  Administered 2016-01-16: 1 via OPHTHALMIC

## 2016-01-16 MED ORDER — TETRACAINE HCL 0.5 % OP SOLN
OPHTHALMIC | Status: AC
  Filled 2016-01-16: qty 2

## 2016-01-16 MED ORDER — CARBACHOL 0.01 % IO SOLN
INTRAOCULAR | Status: DC | PRN
  Administered 2016-01-16: 0.5 mL via INTRAOCULAR

## 2016-01-16 MED ORDER — EPINEPHRINE HCL 1 MG/ML IJ SOLN
INTRAMUSCULAR | Status: AC
  Filled 2016-01-16: qty 1

## 2016-01-16 MED ORDER — MIDAZOLAM HCL 2 MG/2ML IJ SOLN
INTRAMUSCULAR | Status: DC | PRN
  Administered 2016-01-16 (×2): 0.5 mg via INTRAVENOUS

## 2016-01-16 MED ORDER — POVIDONE-IODINE 5 % OP SOLN
OPHTHALMIC | Status: AC
  Filled 2016-01-16: qty 30

## 2016-01-16 MED ORDER — TETRACAINE HCL 0.5 % OP SOLN
1.0000 [drp] | OPHTHALMIC | Status: AC | PRN
  Administered 2016-01-16: 1 [drp] via OPHTHALMIC

## 2016-01-16 MED ORDER — POVIDONE-IODINE 5 % OP SOLN: 1.0000 "application " | OPHTHALMIC | Status: DC | PRN

## 2016-01-16 MED ORDER — MOXIFLOXACIN HCL 0.5 % OP SOLN
OPHTHALMIC | Status: DC | PRN
  Administered 2016-01-16: 4 [drp] via OPHTHALMIC

## 2016-01-16 MED ORDER — ALFENTANIL 500 MCG/ML IJ INJ
INJECTION | INTRAMUSCULAR | Status: DC | PRN
  Administered 2016-01-16: 500 ug via INTRAVENOUS

## 2016-01-16 MED ORDER — MOXIFLOXACIN HCL 0.5 % OP SOLN: 1.0000 [drp] | OPHTHALMIC | Status: DC | PRN

## 2016-01-16 MED ORDER — EPINEPHRINE HCL 1 MG/ML IJ SOLN
INTRAMUSCULAR | Status: DC | PRN
  Administered 2016-01-16: 250 mL via OPHTHALMIC

## 2016-01-16 SURGICAL SUPPLY — 22 items
CANNULA ANT/CHMB 27GA (MISCELLANEOUS) ×3 IMPLANT
CUP MEDICINE 2OZ PLAST GRAD ST (MISCELLANEOUS) ×3 IMPLANT
GLOVE BIO SURGEON STRL SZ8 (GLOVE) ×3 IMPLANT
GLOVE BIOGEL M 6.5 STRL (GLOVE) ×3 IMPLANT
GLOVE SURG LX 8.0 MICRO (GLOVE) ×2
GLOVE SURG LX STRL 8.0 MICRO (GLOVE) ×1 IMPLANT
GOWN STRL REUS W/ TWL LRG LVL3 (GOWN DISPOSABLE) ×2 IMPLANT
GOWN STRL REUS W/TWL LRG LVL3 (GOWN DISPOSABLE) ×4
LENS IOL TECNIS 20.5 (Intraocular Lens) ×3 IMPLANT
LENS IOL TECNIS MONO 1P 20.5 (Intraocular Lens) ×1 IMPLANT
PACK CATARACT (MISCELLANEOUS) ×3 IMPLANT
PACK CATARACT BRASINGTON LX (MISCELLANEOUS) ×3 IMPLANT
PACK EYE AFTER SURG (MISCELLANEOUS) ×3 IMPLANT
SOL BSS BAG (MISCELLANEOUS) ×3
SOL PREP PVP 2OZ (MISCELLANEOUS) ×3
SOLUTION BSS BAG (MISCELLANEOUS) ×1 IMPLANT
SOLUTION PREP PVP 2OZ (MISCELLANEOUS) ×1 IMPLANT
SYR 3ML LL SCALE MARK (SYRINGE) ×3 IMPLANT
SYR 5ML LL (SYRINGE) ×3 IMPLANT
SYR TB 1ML 27GX1/2 LL (SYRINGE) ×3 IMPLANT
WATER STERILE IRR 1000ML POUR (IV SOLUTION) ×3 IMPLANT
WIPE NON LINTING 3.25X3.25 (MISCELLANEOUS) ×3 IMPLANT

## 2016-01-16 NOTE — Transfer of Care (Signed)
Immediate Anesthesia Transfer of Care Note  Patient: Marvin Lewis  Procedure(s) Performed: Procedure(s) with comments: CATARACT EXTRACTION PHACO AND INTRAOCULAR LENS PLACEMENT (IOC) (Right) - [place]: 00:45.0 AP%: 25.2 CDE: 11.36  Lot #[id number]  Patient Location: PACU  Anesthesia Type:MAC  Level of Consciousness: awake, alert , oriented and patient cooperative  Airway & Oxygen Therapy: Patient Spontanous Breathing  Post-op Assessment: Report given to RN, Post -op Vital signs reviewed and stable and Patient moving all extremities X 4  Post vital signs: Reviewed and stable  Last Vitals:  Filed Vitals:   01/16/16 0703  BP: 139/71  Pulse: 71  Temp: 36.8 C  Resp: 18    Complications: No apparent anesthesia complications

## 2016-01-16 NOTE — H&P (Signed)
All labs reviewed. Abnormal studies sent to patients PCP when indicated.  Previous H&P reviewed, patient examined, there are NO CHANGES.  Marvin Lewis LOUIS2/7/20178:19 AM

## 2016-01-16 NOTE — Anesthesia Postprocedure Evaluation (Signed)
Anesthesia Post Note  Patient: Marvin Lewis  Procedure(s) Performed: Procedure(s) (LRB): CATARACT EXTRACTION PHACO AND INTRAOCULAR LENS PLACEMENT (IOC) (Right)  Patient location during evaluation: PACU Anesthesia Type: MAC Level of consciousness: awake and alert and oriented Pain management: satisfactory to patient Vital Signs Assessment: post-procedure vital signs reviewed and stable Respiratory status: spontaneous breathing and respiratory function stable Cardiovascular status: stable    Last Vitals:  Filed Vitals:   01/16/16 0703  BP: 139/71  Pulse: 71  Temp: 36.8 C  Resp: 18    Last Pain: There were no vitals filed for this visit.               Silvana Newness A

## 2016-01-16 NOTE — Op Note (Signed)
PREOPERATIVE DIAGNOSIS:  Nuclear sclerotic cataract of the right eye.   POSTOPERATIVE DIAGNOSIS: right nuclear sclerotic CATARACT   OPERATIVE PROCEDURE:  Procedure(s): CATARACT EXTRACTION PHACO AND INTRAOCULAR LENS PLACEMENT (IOC)   SURGEON:  Birder Robson, MD.   ANESTHESIA:  Anesthesiologist: Martha Clan, MD CRNA: Silvana Newness, CRNA; Courtney Paris, CRNA  1.      Managed anesthesia care. 2.      Topical tetracaine drops followed by 2% Xylocaine jelly applied in the preoperative holding area.   COMPLICATIONS:  None.   TECHNIQUE:   Stop and chop   DESCRIPTION OF PROCEDURE:  The patient was examined and consented in the preoperative holding area where the aforementioned topical anesthesia was applied to the right eye and then brought back to the Operating Room where the right eye was prepped and draped in the usual sterile ophthalmic fashion and a lid speculum was placed. A paracentesis was created with the side port blade and the anterior chamber was filled with viscoelastic. A near clear corneal incision was performed with the steel keratome. A continuous curvilinear capsulorrhexis was performed with a cystotome followed by the capsulorrhexis forceps. Hydrodissection and hydrodelineation were carried out with BSS on a blunt cannula. The lens was removed in a stop and chop  technique and the remaining cortical material was removed with the irrigation-aspiration handpiece. The capsular bag was inflated with viscoelastic and the Technis ZCB00  lens was placed in the capsular bag without complication. The remaining viscoelastic was removed from the eye with the irrigation-aspiration handpiece. The wounds were hydrated. The anterior chamber was flushed with Miostat and the eye was inflated to physiologic pressure. 0.1 mL of cefuroxime concentration 10 mg/mL was placed in the anterior chamber. The wounds were found to be water tight. The eye was dressed with Vigamox. The patient was given  protective glasses to wear throughout the day and a shield with which to sleep tonight. The patient was also given drops with which to begin a drop regimen today and will follow-up with me in one day.  Implant Name Type Inv. Item Serial No. Manufacturer Lot No. LRB No. Used  LENS IMPL INTRAOC ZCB00 20.5 - [id number] Intraocular Lens LENS IMPL INTRAOC ZCB00 20.5 [id number] AMO [id number] Right 1   Procedure(s) with comments: CATARACT EXTRACTION PHACO AND INTRAOCULAR LENS PLACEMENT (IOC) (Right) - [place]: 00:45.0 AP%: 25.2 CDE: 11.36  Lot #[id number]  Electronically signed: [place] 01/16/2016 8:44 AM

## 2016-01-16 NOTE — Anesthesia Preprocedure Evaluation (Signed)
Anesthesia Evaluation  Patient identified by MRN, date of birth, ID band Patient awake    Reviewed: Allergy & Precautions, H&P , NPO status , Patient's Chart, lab work & pertinent test results, reviewed documented beta blocker date and time   History of Anesthesia Complications Negative for: history of anesthetic complications  Airway Mallampati: I  TM Distance: >3 FB Neck ROM: full    Dental no notable dental hx. (+) Partial Upper, Partial Lower, Poor Dentition   Pulmonary shortness of breath and with exertion, asthma , sleep apnea and Oxygen sleep apnea , pneumonia, resolved, COPD, neg recent URI, Current Smoker,    Pulmonary exam normal breath sounds clear to auscultation       Cardiovascular Exercise Tolerance: Good hypertension, On Medications and On Home Beta Blockers + CAD and + Peripheral Vascular Disease  (-) Past MI, (-) Cardiac Stents and (-) CABG Normal cardiovascular exam+ dysrhythmias (1st degree AV block) (-) Valvular Problems/Murmurs Rhythm:regular Rate:Normal     Neuro/Psych neg Seizures PSYCHIATRIC DISORDERS (Depression and alcoholism) CVA, No Residual Symptoms negative psych ROS   GI/Hepatic hiatal hernia, GERD  ,(+) neg Cirrhosis      , Hepatitis -, C  Endo/Other  diabetes  Renal/GU negative Renal ROS  negative genitourinary   Musculoskeletal   Abdominal   Peds  Hematology negative hematology ROS (+)   Anesthesia Other Findings Past Medical History:   Pneumonia                                                    GERD (gastroesophageal reflux disease)                       Depression                                                   Ascending aortic aneurysm (HCC)                              Ascending aortic aneurysm (HCC)                              CAD (coronary artery disease)                                Hepatitis C                                                  Cirrhosis (HCC)                                               Thrombocytopenia ([place])  Alcohol abuse                                                History of smoking                                           Onychogryphosis                                              Insomnia                                                     Hypoxemia                                                    1St degree AV block                                          Broken rib                                      April 2016     Comment:History of broken ribs x 3   History of diverticulitis                                      Comment:ruptured and sepsis   Diabetes mellitus without complication (HCC)                 Alcohol use ([place])                                            Chronic alcoholism ([place])                                     Movement disorder                                            COPD (chronic obstructive pulmonary disease) (*              Emphysema of lung (HCC)                                      Hyperlipidemia  Hypertension                                                 Hip arthritis                                                Closed nondisplaced fracture of fifth metatars* Nov. 2016      Comment:left   Sleep apnea                                                    Comment:uses O2 '@2'$  L/min at night   Stroke ([place])                                                 History of hiatal hernia                                     HOH (hard of hearing)                                        Seasonal allergies                                           Esophageal rupture                                           Rib fracture                                                 Bronchitis                                                   Reproductive/Obstetrics negative OB ROS                             Anesthesia  Physical Anesthesia Plan  ASA: IV  Anesthesia Plan: MAC   Post-op Pain Management:    Induction:   Airway Management Planned:   Additional Equipment:   Intra-op Plan:   Post-operative Plan:   Informed Consent: I have reviewed the patients History and Physical, chart, labs and discussed the procedure including the risks, benefits and alternatives for the proposed anesthesia with the patient or authorized  representative who has indicated his/her understanding and acceptance.   Dental Advisory Given  Plan Discussed with: Anesthesiologist, CRNA and Surgeon  Anesthesia Plan Comments:         Anesthesia Quick Evaluation

## 2016-01-16 NOTE — Discharge Instructions (Signed)
Eye Surgery Discharge Instructions  Expect mild scratchy sensation or mild soreness. DO NOT RUB YOUR EYE!  The day of surgery:  Minimal physical activity, but bed rest is not required  No reading, computer work, or close hand work  No bending, lifting, or straining.  May watch TV  For 24 hours:  No driving, legal decisions, or alcoholic beverages  Safety precautions  Eat anything you prefer: It is better to start with liquids, then soup then solid foods.  _____ Eye patch should be worn until postoperative exam tomorrow.  ____ Solar shield eyeglasses should be worn for comfort in the sunlight/patch while sleeping  Resume all regular medications including aspirin or Coumadin if these were discontinued prior to surgery. You may shower, bathe, shave, or wash your hair. Tylenol may be taken for mild discomfort.  Call your doctor if you experience significant pain, nausea, or vomiting, fever > 101 or other signs of infection. [phone] or [phone] Specific instructions:  Follow-up Information    Follow up with Tim Lair, MD On 01/17/2016.   Specialty:  Ophthalmology   Why:  9:55   Contact information:   [address] [place] [address] [phone]

## 2016-01-29 DIAGNOSIS — J431 Panlobular emphysema: Secondary | ICD-10-CM | POA: Diagnosis not present

## 2016-01-29 DIAGNOSIS — R091 Pleurisy: Secondary | ICD-10-CM | POA: Diagnosis not present

## 2016-01-29 DIAGNOSIS — R05 Cough: Secondary | ICD-10-CM | POA: Diagnosis not present

## 2016-01-29 DIAGNOSIS — Z720 Tobacco use: Secondary | ICD-10-CM | POA: Diagnosis not present

## 2016-01-29 DIAGNOSIS — J181 Lobar pneumonia, unspecified organism: Secondary | ICD-10-CM | POA: Diagnosis not present

## 2016-02-06 ENCOUNTER — Encounter: Admission: RE | Payer: Self-pay | Source: Ambulatory Visit

## 2016-02-06 ENCOUNTER — Ambulatory Visit: Admission: RE | Admit: 2016-02-06 | Payer: Medicare Other | Source: Ambulatory Visit | Admitting: Ophthalmology

## 2016-02-06 SURGERY — PHACOEMULSIFICATION, CATARACT, WITH IOL INSERTION
Anesthesia: Choice | Laterality: Left

## 2016-02-12 DIAGNOSIS — J431 Panlobular emphysema: Secondary | ICD-10-CM | POA: Diagnosis not present

## 2016-02-12 DIAGNOSIS — Z720 Tobacco use: Secondary | ICD-10-CM | POA: Diagnosis not present

## 2016-02-12 DIAGNOSIS — R918 Other nonspecific abnormal finding of lung field: Secondary | ICD-10-CM | POA: Diagnosis not present

## 2016-02-12 DIAGNOSIS — R05 Cough: Secondary | ICD-10-CM | POA: Diagnosis not present

## 2016-02-23 ENCOUNTER — Other Ambulatory Visit: Payer: Medicare Other

## 2016-02-23 ENCOUNTER — Telehealth: Payer: Self-pay

## 2016-02-23 ENCOUNTER — Other Ambulatory Visit: Payer: Self-pay

## 2016-02-23 DIAGNOSIS — I10 Essential (primary) hypertension: Secondary | ICD-10-CM | POA: Diagnosis not present

## 2016-02-23 DIAGNOSIS — M109 Gout, unspecified: Secondary | ICD-10-CM | POA: Insufficient documentation

## 2016-02-23 DIAGNOSIS — M10049 Idiopathic gout, unspecified hand: Secondary | ICD-10-CM

## 2016-02-23 MED ORDER — BENAZEPRIL HCL 20 MG PO TABS: 20.0000 mg | ORAL_TABLET | Freq: Every day | ORAL | Status: DC

## 2016-02-23 NOTE — Telephone Encounter (Signed)
I can't do 90 day supply just now Patient was supposed to return for a blood draw a few weeks ago; I really need to watch his kidneys and potassium on his BP pill that he just requested I'll allow just five pills but I really need him to please come in for labs

## 2016-02-23 NOTE — Telephone Encounter (Signed)
His insurance covers a 90 day supply, they'd like it to be 90 day supply instead of 30.

## 2016-02-23 NOTE — Telephone Encounter (Signed)
I entered uric acid but you'll have to release it please to lab Thank you

## 2016-02-23 NOTE — Telephone Encounter (Signed)
Patients wife notified

## 2016-02-23 NOTE — Telephone Encounter (Signed)
He came in for his BMP (I had to reorder it, I couldn't release the one that was already in there. He also thinks he has gout in his finger and wants to know if you will order a uric acid level on him.

## 2016-02-23 NOTE — Telephone Encounter (Signed)
Lab notified to add on.

## 2016-02-24 LAB — BASIC METABOLIC PANEL
BUN/Creatinine Ratio: 35 — ABNORMAL HIGH (ref 10–22)
BUN: 28 mg/dL — AB (ref 8–27)
CALCIUM: 9.3 mg/dL (ref 8.6–10.2)
CO2: 20 mmol/L (ref 18–29)
CREATININE: 0.8 mg/dL (ref 0.76–1.27)
Chloride: 99 mmol/L (ref 96–106)
GFR calc Af Amer: 109 mL/min/{1.73_m2} (ref >59)
GFR, EST NON AFRICAN AMERICAN: 94 mL/min/{1.73_m2} (ref >59)
Glucose: 221 mg/dL — ABNORMAL HIGH (ref 65–99)
Potassium: 4.9 mmol/L (ref 3.5–5.2)
Sodium: 139 mmol/L (ref 134–144)

## 2016-02-24 LAB — URIC ACID: URIC ACID: 6.9 mg/dL (ref 3.7–8.6)

## 2016-02-26 ENCOUNTER — Telehealth: Payer: Self-pay | Admitting: Family Medicine

## 2016-02-26 ENCOUNTER — Other Ambulatory Visit: Payer: Self-pay

## 2016-02-26 MED ORDER — BENAZEPRIL HCL 20 MG PO TABS: 20.0000 mg | ORAL_TABLET | Freq: Every day | ORAL | Status: DC

## 2016-02-26 MED ORDER — COLCHICINE 0.6 MG PO TABS: ORAL_TABLET | ORAL | Status: DC

## 2016-02-26 NOTE — Telephone Encounter (Signed)
Patient notified

## 2016-02-26 NOTE — Telephone Encounter (Signed)
Please let patient know that his potassium is back to normal; good news He appears dry; please encourage him to drink 8 glasses of water a day He does have an elevated uric acid; I sent a Rx for colchicine to take; do NOT take cholesterol medicine for five days after taking the colchicine

## 2016-02-26 NOTE — Telephone Encounter (Signed)
He needs a refill on Benazepril, would like a 90 day supply.

## 2016-02-26 NOTE — Telephone Encounter (Signed)
Potassium recheck was normal

## 2016-03-13 DIAGNOSIS — J431 Panlobular emphysema: Secondary | ICD-10-CM | POA: Diagnosis not present

## 2016-03-13 DIAGNOSIS — Z720 Tobacco use: Secondary | ICD-10-CM | POA: Diagnosis not present

## 2016-03-13 DIAGNOSIS — R0609 Other forms of dyspnea: Secondary | ICD-10-CM | POA: Diagnosis not present

## 2016-03-13 DIAGNOSIS — R05 Cough: Secondary | ICD-10-CM | POA: Diagnosis not present

## 2016-03-27 DIAGNOSIS — R600 Localized edema: Secondary | ICD-10-CM | POA: Diagnosis not present

## 2016-03-27 DIAGNOSIS — R0681 Apnea, not elsewhere classified: Secondary | ICD-10-CM | POA: Insufficient documentation

## 2016-03-27 DIAGNOSIS — I712 Thoracic aortic aneurysm, without rupture: Secondary | ICD-10-CM | POA: Diagnosis not present

## 2016-03-27 DIAGNOSIS — R0602 Shortness of breath: Secondary | ICD-10-CM | POA: Diagnosis not present

## 2016-03-27 DIAGNOSIS — J431 Panlobular emphysema: Secondary | ICD-10-CM | POA: Diagnosis not present

## 2016-03-27 DIAGNOSIS — I10 Essential (primary) hypertension: Secondary | ICD-10-CM | POA: Diagnosis not present

## 2016-03-27 DIAGNOSIS — E119 Type 2 diabetes mellitus without complications: Secondary | ICD-10-CM | POA: Diagnosis not present

## 2016-03-27 DIAGNOSIS — R0609 Other forms of dyspnea: Secondary | ICD-10-CM | POA: Diagnosis not present

## 2016-03-27 DIAGNOSIS — R0789 Other chest pain: Secondary | ICD-10-CM | POA: Diagnosis not present

## 2016-03-29 DIAGNOSIS — I481 Persistent atrial fibrillation: Secondary | ICD-10-CM | POA: Diagnosis not present

## 2016-03-29 DIAGNOSIS — I10 Essential (primary) hypertension: Secondary | ICD-10-CM | POA: Diagnosis not present

## 2016-03-29 DIAGNOSIS — R0609 Other forms of dyspnea: Secondary | ICD-10-CM | POA: Diagnosis not present

## 2016-03-29 DIAGNOSIS — I712 Thoracic aortic aneurysm, without rupture: Secondary | ICD-10-CM | POA: Diagnosis not present

## 2016-03-29 DIAGNOSIS — I4819 Other persistent atrial fibrillation: Secondary | ICD-10-CM | POA: Insufficient documentation

## 2016-03-29 DIAGNOSIS — E119 Type 2 diabetes mellitus without complications: Secondary | ICD-10-CM | POA: Diagnosis not present

## 2016-03-29 DIAGNOSIS — R0602 Shortness of breath: Secondary | ICD-10-CM | POA: Diagnosis not present

## 2016-04-09 DIAGNOSIS — I712 Thoracic aortic aneurysm, without rupture: Secondary | ICD-10-CM | POA: Diagnosis not present

## 2016-04-09 DIAGNOSIS — I481 Persistent atrial fibrillation: Secondary | ICD-10-CM | POA: Diagnosis not present

## 2016-04-09 DIAGNOSIS — E119 Type 2 diabetes mellitus without complications: Secondary | ICD-10-CM | POA: Diagnosis not present

## 2016-04-09 DIAGNOSIS — I4891 Unspecified atrial fibrillation: Secondary | ICD-10-CM | POA: Diagnosis not present

## 2016-04-09 DIAGNOSIS — I10 Essential (primary) hypertension: Secondary | ICD-10-CM | POA: Diagnosis not present

## 2016-04-10 ENCOUNTER — Ambulatory Visit
Admission: RE | Admit: 2016-04-10 | Discharge: 2016-04-10 | Disposition: A | Discharge status: Home / Self Care | Payer: Medicare Other | Source: Ambulatory Visit | Attending: Family Medicine | Admitting: Family Medicine

## 2016-04-10 ENCOUNTER — Ambulatory Visit (INDEPENDENT_AMBULATORY_CARE_PROVIDER_SITE_OTHER): Payer: Medicare Other | Admitting: Family Medicine

## 2016-04-10 ENCOUNTER — Encounter: Payer: Self-pay | Admitting: Family Medicine

## 2016-04-10 VITALS — BP 122/64 | HR 71 | Temp 98.1°F | Resp 16 | Ht 70.0 in | Wt 232.0 lb

## 2016-04-10 DIAGNOSIS — M10049 Idiopathic gout, unspecified hand: Secondary | ICD-10-CM | POA: Diagnosis not present

## 2016-04-10 DIAGNOSIS — I2510 Atherosclerotic heart disease of native coronary artery without angina pectoris: Secondary | ICD-10-CM | POA: Diagnosis not present

## 2016-04-10 DIAGNOSIS — G4700 Insomnia, unspecified: Secondary | ICD-10-CM

## 2016-04-10 DIAGNOSIS — M79644 Pain in right finger(s): Secondary | ICD-10-CM

## 2016-04-10 DIAGNOSIS — K7030 Alcoholic cirrhosis of liver without ascites: Secondary | ICD-10-CM

## 2016-04-10 DIAGNOSIS — E119 Type 2 diabetes mellitus without complications: Secondary | ICD-10-CM | POA: Diagnosis not present

## 2016-04-10 DIAGNOSIS — E875 Hyperkalemia: Secondary | ICD-10-CM

## 2016-04-10 DIAGNOSIS — R635 Abnormal weight gain: Secondary | ICD-10-CM | POA: Diagnosis not present

## 2016-04-10 DIAGNOSIS — E785 Hyperlipidemia, unspecified: Secondary | ICD-10-CM | POA: Diagnosis not present

## 2016-04-10 DIAGNOSIS — R7989 Other specified abnormal findings of blood chemistry: Secondary | ICD-10-CM

## 2016-04-10 DIAGNOSIS — Z5181 Encounter for therapeutic drug level monitoring: Secondary | ICD-10-CM

## 2016-04-10 DIAGNOSIS — J438 Other emphysema: Secondary | ICD-10-CM

## 2016-04-10 DIAGNOSIS — F1010 Alcohol abuse, uncomplicated: Secondary | ICD-10-CM

## 2016-04-10 DIAGNOSIS — I10 Essential (primary) hypertension: Secondary | ICD-10-CM

## 2016-04-10 DIAGNOSIS — R799 Abnormal finding of blood chemistry, unspecified: Secondary | ICD-10-CM

## 2016-04-10 NOTE — Assessment & Plan Note (Addendum)
Discussed sleep hygiene; have neuro or pulm consider ordering sleep study; I am not going to prescribe any sedative hypnotics

## 2016-04-10 NOTE — Patient Instructions (Addendum)
Follow-up with Dr. Manuella Ghazi (or Dr. Raul Del) about getting a sleep study Avoid sleeping pills Do not watch TV for two hours before anticipated bedtime Read or do crossword puzzles etc before bed Have xray done of the finger Return in 1-2 weeks for diabetes and cholesterol and fasting labs I do encourage you to quit smoking Call [phone] to sign up for smoking cessation classes You can call 1-800-QUIT-NOW to talk with a smoking cessation coach  Smoking Cessation, Tips for Success If you are ready to quit smoking, congratulations! You have chosen to help yourself be healthier. Cigarettes bring nicotine, tar, carbon monoxide, and other irritants into your body. Your lungs, heart, and blood vessels will be able to work better without these poisons. There are many different ways to quit smoking. Nicotine gum, nicotine patches, a nicotine inhaler, or nicotine nasal spray can help with physical craving. Hypnosis, support groups, and medicines help break the habit of smoking. WHAT THINGS CAN I DO TO MAKE QUITTING EASIER?  Here are some tips to help you quit for good:  Pick a date when you will quit smoking completely. Tell all of your friends and family about your plan to quit on that date.  Do not try to slowly cut down on the number of cigarettes you are smoking. Pick a quit date and quit smoking completely starting on that day.  Throw away all cigarettes.   Clean and remove all ashtrays from your home, work, and car.  On a card, write down your reasons for quitting. Carry the card with you and read it when you get the urge to smoke.  Cleanse your body of nicotine. Drink enough water and fluids to keep your urine clear or pale yellow. Do this after quitting to flush the nicotine from your body.  Learn to predict your moods. Do not let a bad situation be your excuse to have a cigarette. Some situations in your life might tempt you into wanting a cigarette.  Never have "just one" cigarette. It  leads to wanting another and another. Remind yourself of your decision to quit.  Change habits associated with smoking. If you smoked while driving or when feeling stressed, try other activities to replace smoking. Stand up when drinking your coffee. Brush your teeth after eating. Sit in a different chair when you read the paper. Avoid alcohol while trying to quit, and try to drink fewer caffeinated beverages. Alcohol and caffeine may urge you to smoke.  Avoid foods and drinks that can trigger a desire to smoke, such as sugary or spicy foods and alcohol.  Ask people who smoke not to smoke around you.  Have something planned to do right after eating or having a cup of coffee. For example, plan to take a walk or exercise.  Try a relaxation exercise to calm you down and decrease your stress. Remember, you may be tense and nervous for the first 2 weeks after you quit, but this will pass.  Find new activities to keep your hands busy. Play with a pen, coin, or rubber band. Doodle or draw things on paper.  Brush your teeth right after eating. This will help cut down on the craving for the taste of tobacco after meals. You can also try mouthwash.   Use oral substitutes in place of cigarettes. Try using lemon drops, carrots, cinnamon sticks, or chewing gum. Keep them handy so they are available when you have the urge to smoke.  When you have the urge to smoke, try  deep breathing.  Designate your home as a nonsmoking area.  If you are a heavy smoker, ask your health care provider about a prescription for nicotine chewing gum. It can ease your withdrawal from nicotine.  Reward yourself. Set aside the cigarette money you save and buy yourself something nice.  Look for support from others. Join a support group or smoking cessation program. Ask someone at home or at work to help you with your plan to quit smoking.  Always ask yourself, "Do I need this cigarette or is this just a reflex?" Tell  yourself, "Today, I choose not to smoke," or "I do not want to smoke." You are reminding yourself of your decision to quit.  Do not replace cigarette smoking with electronic cigarettes (commonly called e-cigarettes). The safety of e-cigarettes is unknown, and some may contain harmful chemicals.  If you relapse, do not give up! Plan ahead and think about what you will do the next time you get the urge to smoke. HOW WILL I FEEL WHEN I QUIT SMOKING? You may have symptoms of withdrawal because your body is used to nicotine (the addictive substance in cigarettes). You may crave cigarettes, be irritable, feel very hungry, cough often, get headaches, or have difficulty concentrating. The withdrawal symptoms are only temporary. They are strongest when you first quit but will go away within 10-14 days. When withdrawal symptoms occur, stay in control. Think about your reasons for quitting. Remind yourself that these are signs that your body is healing and getting used to being without cigarettes. Remember that withdrawal symptoms are easier to treat than the major diseases that smoking can cause.  Even after the withdrawal is over, expect periodic urges to smoke. However, these cravings are generally short lived and will go away whether you smoke or not. Do not smoke! WHAT RESOURCES ARE AVAILABLE TO HELP ME QUIT SMOKING? Your health care provider can direct you to community resources or hospitals for support, which may include:  Group support.  Education.  Hypnosis.  Therapy.   This information is not intended to replace advice given to you by your health care provider. Make sure you discuss any questions you have with your health care provider.   Document Released: 08/23/2004 Document Revised: 12/16/2014 Document Reviewed: 05/13/2013 Elsevier Interactive Patient Education [employer].

## 2016-04-10 NOTE — Progress Notes (Signed)
BP 122/64 mmHg  Pulse 71  Temp(Src) 98.1 F (36.7 C) (Oral)  Resp 16  Ht '5\' 10"'$  (1.778 m)  Wt 232 lb (105.235 kg)  BMI 33.29 kg/m2  SpO2 94%   Subjective:    Patient ID: Marvin Lewis, male    DOB: 03-08-1951, 65 y.o.   MRN: [id number]  HPI: Marvin Lewis is a 65 y.o. male  Chief Complaint  Patient presents with  . Follow-up  . Atrial Fibrillation    cardiologist dx with a-fib currently on eliquis  . Insomnia  . Hand Pain    right index   He had pneumonia; not feeling well for a few months; saw Dr. Ubaldo Glassing and was found to be in atrial fibrillation; now on Eliquis and may switch to warfarn; no bleeding from gums, nose, urine, stool; easy bruising; not conscious of a-fib; no chest pain; having SHOB but that's not new; watching dark greens; they will talk with Dr. Ubaldo Glassing about medicine; they are considering cardioversion  He had pneumonia 2 months ago; was on antibiotics; saw Dr. Raul Del, bottom of right lung; then had swelling in feet and ankles and had water weight gain, symptoms of heart failure; put him on diuretic for a few weeks to pull some fluid off; dropped several pounds; having cramps, so stopped diuretic; they told Dr. Ubaldo Glassing yesterday  Insomnia; he has seen neurologist, but did not talked to him about insomnia; he is on oxygen at night; neurologist talked to him about sleep apnea; he has not had a sleep study; no caffeine; no chocolate or tea; no electronics in bed; does watch TV  Right index finger is really sore; it got red and swollen; he can't cut meat  He is drinking alcohol; last drinks were two beers last night; about average; much less than before  Cardiology: Dr. Ubaldo Glassing Neuro: Dr. Manuella Ghazi Pulm: Dr. Raul Del Podiatry: Dr. Cleda Mccreedy GI: Dr. Gustavo Lah, Dr. Posey Pronto  Depression screen [facility] 2/9 04/10/2016  Decreased Interest 0  Down, Depressed, Hopeless 0  PHQ - 2 Score 0   Relevant past medical, surgical, family and social history reviewed Past Medical History    Diagnosis Date  . Pneumonia   . GERD (gastroesophageal reflux disease)   . Depression   . Ascending aortic aneurysm ([place])   . Ascending aortic aneurysm ([place])   . CAD (coronary artery disease)   . Hepatitis C   . Cirrhosis ([place])   . Thrombocytopenia ([place])   . Alcohol abuse   . History of smoking   . Onychogryphosis   . Insomnia   . Hypoxemia   . 1St degree AV block   . Broken rib April 2016    History of broken ribs x 3  . History of diverticulitis     ruptured and sepsis  . Diabetes mellitus without complication ([place])   . Alcohol use ([place])   . Chronic alcoholism ([place])   . Movement disorder   . COPD (chronic obstructive pulmonary disease) ([place])   . Emphysema of lung ([place])   . Hyperlipidemia   . Hypertension   . Hip arthritis   . Closed nondisplaced fracture of fifth metatarsal bone of left foot Nov. 2016    left  . Sleep apnea     uses O2 '@2'$  L/min at night  . Stroke ([place])   . History of hiatal hernia   . HOH (hard of hearing)   . Seasonal allergies   . Esophageal rupture   . Rib fracture   .  Bronchitis    Past Surgical History  Procedure Laterality Date  . Peg placement N/A   . Peg tube removal N/A   . Tracheostomy N/A   . Cholecystectomy    . Pilonidal cyst excision N/A   . Hernia repair    . Colon resection    . Cataract extraction w/phaco Right 01/16/2016    Procedure: CATARACT EXTRACTION PHACO AND INTRAOCULAR LENS PLACEMENT (IOC);  Surgeon: Birder Robson, MD;  Location: [facility] ORS;  Service: Ophthalmology;  Laterality: Right;  [place]: 00:45.0 AP%: 25.2 CDE: 11.36  Lot #[id number]   Family History  Problem Relation Age of Onset  . Coronary artery disease Mother   . Hypertension Mother   . Heart disease Mother   . Lung disease Mother   . Coronary artery disease Father   . Hypertension Father   . Diabetes Father   . Heart disease Father   . Cancer Father     prostate  . Alcohol abuse Brother    Social History  Substance Use Topics  . Smoking status:  Current Every Day Smoker -- 0.25 packs/day for 40 years    Types: Cigarettes  . Smokeless tobacco: Never Used  . Alcohol Use: 0.0 oz/week    0 Standard drinks or equivalent per week     Comment: patient states he no longer drinks liquor, states he drinks very little  He still drinks  Interim medical history since last visit reviewed. Allergies and medications reviewed  Review of Systems  Constitutional: Positive for unexpected weight change (17 pounds weight gain).  Neurological:       Not sleeping well, insomnia is back   Per HPI unless specifically indicated above     Objective:    BP 122/64 mmHg  Pulse 71  Temp(Src) 98.1 F (36.7 C) (Oral)  Resp 16  Ht '5\' 10"'$  (1.778 m)  Wt 232 lb (105.235 kg)  BMI 33.29 kg/m2  SpO2 94%  Wt Readings from Last 3 Encounters:  04/10/16 232 lb (105.235 kg)  01/15/16 215 lb (97.523 kg)  01/10/16 225 lb (102.059 kg)    Physical Exam  Constitutional: He appears well-developed and well-nourished. No distress.  Obese; 17 pound weight gain over last 3 months  HENT:  Head: Normocephalic and atraumatic.  Voice is very hoarse  Eyes: EOM are normal. No scleral icterus.  Neck: No JVD present. No thyromegaly present.  Cardiovascular: Normal rate.  An irregularly irregular rhythm present.  Pulmonary/Chest: Effort normal. He has no decreased breath sounds. He has no wheezes. He has no rhonchi. He has no rales.  Abdominal: Soft. Bowel sounds are normal. He exhibits no distension.  Musculoskeletal: He exhibits no edema.  Neurological: Coordination normal.  Jerky movements of head and upper extremities; restless, hyperactive  Skin: Skin is warm and dry. No pallor.  Scale of soles of both feet  Psychiatric: He has a normal mood and affect. His behavior is normal. Judgment and thought content normal. His mood appears not anxious. He does not exhibit a depressed mood.  Good eye contact with examiner   Results for orders placed or performed in visit on  26/94/85  Basic Metabolic Panel (BMET)  Result Value Ref Range   Glucose 221 (H) 65 - 99 mg/dL   BUN 28 (H) 8 - 27 mg/dL   Creatinine, Ser 0.80 0.76 - 1.27 mg/dL   GFR calc non Af Amer 94 >59 mL/min/1.73   GFR calc Af Amer 109 >59 mL/min/1.73   BUN/Creatinine Ratio 35 (H) 10 -  22   Sodium 139 134 - 144 mmol/L   Potassium 4.9 3.5 - 5.2 mmol/L   Chloride 99 96 - 106 mmol/L   CO2 20 18 - 29 mmol/L   Calcium 9.3 8.6 - 10.2 mg/dL  Uric acid  Result Value Ref Range   Uric Acid 6.9 3.7 - 8.6 mg/dL      Assessment & Plan:   Problem List Items Addressed This Visit      Cardiovascular and Mediastinum   CAD (coronary artery disease)    Managed by cardiologist      Relevant Medications   apixaban (ELIQUIS) 5 MG TABS tablet   Other Relevant Orders   Lipid Panel w/o Chol/HDL Ratio   Hypertension    Well-controlled today      Relevant Medications   apixaban (ELIQUIS) 5 MG TABS tablet     Respiratory   COPD (chronic obstructive pulmonary disease) ([place])    Managed by pulm; urged pt to quit smoking        Digestive   Cirrhosis ([place])    Encouraged patient to quit drinking; check liver enzymes      Relevant Orders   Comprehensive metabolic panel     Endocrine   Diabetes mellitus without complication ([place]) - Primary    Foot exam by MD today; check A1c, urine microalbumin every 6 months; on ACE-I, but having to watch K+      Relevant Orders   Lipid Panel w/o Chol/HDL Ratio   Hgb A1c w/o eAG     Other   Abnormal weight gain   Relevant Orders   TSH   Alcohol abuse    Stressed importance of limiting alcohol and smoking; he is really hurting himself by smoking and drinking; I am here to help      Relevant Orders   CBC with Differential/Platelet   Elevated brain natriuretic peptide (BNP) level    Noted during recent hospital stay; followed by cardiologist; Medicare would not pay for me to draw this again      Gout    Check uric acid      Hyperkalemia    Check  K+      Hyperlipidemia    Check lipids, avoid / limit saturated fats      Relevant Medications   apixaban (ELIQUIS) 5 MG TABS tablet   Insomnia    Discussed sleep hygiene; have neuro or pulm consider ordering sleep study; I am not going to prescribe any sedative hypnotics      Medication monitoring encounter   Relevant Orders   Comprehensive metabolic panel   CBC with Differential/Platelet    Other Visit Diagnoses    Finger pain, right        Relevant Orders    DG Finger Index Right (Completed)        Follow up plan: Return 1-2 weeks, for diabetes and cholesterol; fasting labs one day before.  An after-visit summary was printed and given to the patient at [facility].  Please see the patient instructions which may contain other information and recommendations beyond what is mentioned above in the assessment and plan.  Meds ordered this encounter  Medications  . apixaban (ELIQUIS) 5 MG TABS tablet    Sig: Take 5 mg by mouth 2 (two) times daily.   Orders Placed This Encounter  Procedures  . DG Finger Index Right  . Lipid Panel w/o Chol/HDL Ratio  . Hgb A1c w/o eAG  . Comprehensive metabolic panel  . CBC with Differential/Platelet  .  TSH

## 2016-04-16 DIAGNOSIS — E875 Hyperkalemia: Secondary | ICD-10-CM | POA: Diagnosis not present

## 2016-04-22 ENCOUNTER — Other Ambulatory Visit: Payer: Self-pay

## 2016-04-22 ENCOUNTER — Telehealth: Payer: Self-pay

## 2016-04-22 DIAGNOSIS — R7989 Other specified abnormal findings of blood chemistry: Secondary | ICD-10-CM | POA: Insufficient documentation

## 2016-04-22 DIAGNOSIS — F1010 Alcohol abuse, uncomplicated: Secondary | ICD-10-CM

## 2016-04-22 DIAGNOSIS — K7030 Alcoholic cirrhosis of liver without ascites: Secondary | ICD-10-CM

## 2016-04-22 DIAGNOSIS — Z5181 Encounter for therapeutic drug level monitoring: Secondary | ICD-10-CM

## 2016-04-22 DIAGNOSIS — E119 Type 2 diabetes mellitus without complications: Secondary | ICD-10-CM

## 2016-04-22 DIAGNOSIS — R635 Abnormal weight gain: Secondary | ICD-10-CM | POA: Insufficient documentation

## 2016-04-22 DIAGNOSIS — I2510 Atherosclerotic heart disease of native coronary artery without angina pectoris: Secondary | ICD-10-CM

## 2016-04-22 DIAGNOSIS — Z7189 Other specified counseling: Secondary | ICD-10-CM | POA: Insufficient documentation

## 2016-04-22 NOTE — Telephone Encounter (Signed)
Thank you so much I just entered his labs They are marked as "future" for tomorrow; thank you!

## 2016-04-22 NOTE — Telephone Encounter (Signed)
Pt is suppose to have labs before his next appt.  He will be here tomorrow am to pick up order.  I do not see any future labs please order?

## 2016-04-22 NOTE — Assessment & Plan Note (Addendum)
Noted during recent hospital stay; followed by cardiologist; Medicare would not pay for me to draw this again

## 2016-04-22 NOTE — Assessment & Plan Note (Signed)
Well controlled today.

## 2016-04-22 NOTE — Assessment & Plan Note (Signed)
Managed by pulm; urged pt to quit smoking

## 2016-04-22 NOTE — Assessment & Plan Note (Signed)
Check uric acid. 

## 2016-04-22 NOTE — Assessment & Plan Note (Signed)
Check K+

## 2016-04-22 NOTE — Assessment & Plan Note (Signed)
Foot exam by MD today; check A1c, urine microalbumin every 6 months; on ACE-I, but having to watch K+

## 2016-04-22 NOTE — Assessment & Plan Note (Signed)
Stressed importance of limiting alcohol and smoking; he is really hurting himself by smoking and drinking; I am here to help

## 2016-04-22 NOTE — Assessment & Plan Note (Signed)
Encouraged patient to quit drinking; check liver enzymes

## 2016-04-22 NOTE — Assessment & Plan Note (Signed)
Managed by cardiologist 

## 2016-04-22 NOTE — Assessment & Plan Note (Signed)
Check lipids, avoid / limit saturated fats

## 2016-04-23 ENCOUNTER — Ambulatory Visit: Payer: Medicare Other | Admitting: Family Medicine

## 2016-04-24 ENCOUNTER — Other Ambulatory Visit: Payer: Self-pay

## 2016-04-24 DIAGNOSIS — E875 Hyperkalemia: Secondary | ICD-10-CM

## 2016-04-24 LAB — HGB A1C W/O EAG: Hgb A1c MFr Bld: 9.9 % — ABNORMAL HIGH (ref 4.8–5.6)

## 2016-04-24 LAB — COMPREHENSIVE METABOLIC PANEL
A/G RATIO: 1.4 (ref 1.2–2.2)
ALK PHOS: 100 IU/L (ref 39–117)
ALT: 35 IU/L (ref 0–44)
AST: 25 IU/L (ref 0–40)
Albumin: 4.1 g/dL (ref 3.6–4.8)
BUN/Creatinine Ratio: 20 (ref 10–24)
BUN: 25 mg/dL (ref 8–27)
Bilirubin Total: 0.5 mg/dL (ref 0.0–1.2)
CALCIUM: 9.3 mg/dL (ref 8.6–10.2)
CO2: 25 mmol/L (ref 18–29)
Chloride: 99 mmol/L (ref 96–106)
Creatinine, Ser: 1.28 mg/dL — ABNORMAL HIGH (ref 0.76–1.27)
GFR calc Af Amer: 68 mL/min/{1.73_m2} (ref >59)
GFR calc non Af Amer: 59 mL/min/{1.73_m2} — ABNORMAL LOW (ref >59)
GLOBULIN, TOTAL: 2.9 g/dL (ref 1.5–4.5)
Glucose: 240 mg/dL — ABNORMAL HIGH (ref 65–99)
POTASSIUM: 5.6 mmol/L — AB (ref 3.5–5.2)
SODIUM: 142 mmol/L (ref 134–144)
Total Protein: 7 g/dL (ref 6.0–8.5)

## 2016-04-24 LAB — LIPID PANEL W/O CHOL/HDL RATIO
CHOLESTEROL TOTAL: 139 mg/dL (ref 100–199)
HDL: 46 mg/dL (ref >39)
LDL CALC: 65 mg/dL (ref 0–99)
Triglycerides: 138 mg/dL (ref 0–149)
VLDL CHOLESTEROL CAL: 28 mg/dL (ref 5–40)

## 2016-04-24 LAB — CBC WITH DIFFERENTIAL/PLATELET
BASOS: 1 %
Basophils Absolute: 0.1 10*3/uL (ref 0.0–0.2)
EOS (ABSOLUTE): 0.4 10*3/uL (ref 0.0–0.4)
EOS: 4 %
HEMATOCRIT: 37.3 % — AB (ref 37.5–51.0)
HEMOGLOBIN: 11.2 g/dL — AB (ref 12.6–17.7)
IMMATURE GRANS (ABS): 0 10*3/uL (ref 0.0–0.1)
Immature Granulocytes: 0 %
LYMPHS: 19 %
Lymphocytes Absolute: 1.8 10*3/uL (ref 0.7–3.1)
MCH: 24.7 pg — AB (ref 26.6–33.0)
MCHC: 30 g/dL — ABNORMAL LOW (ref 31.5–35.7)
MCV: 82 fL (ref 79–97)
MONOCYTES: 7 %
Monocytes Absolute: 0.7 10*3/uL (ref 0.1–0.9)
NEUTROS ABS: 6.3 10*3/uL (ref 1.4–7.0)
Neutrophils: 69 %
Platelets: 140 10*3/uL — ABNORMAL LOW (ref 150–379)
RBC: 4.54 x10E6/uL (ref 4.14–5.80)
RDW: 14.8 % (ref 12.3–15.4)
WBC: 9.2 10*3/uL (ref 3.4–10.8)

## 2016-04-24 LAB — TSH: TSH: 1.32 u[IU]/mL (ref 0.450–4.500)

## 2016-04-25 NOTE — Progress Notes (Signed)
Marvin Lewis, patient does not need an entire CMP (CMET); he just needs a recheck potassium I entered a new potassium order; please make sure he just get the potassium done; thanks

## 2016-04-29 ENCOUNTER — Telehealth: Payer: Self-pay | Admitting: Family Medicine

## 2016-04-29 NOTE — Telephone Encounter (Signed)
Pt wife notified she states he has had his potassium checked 3x times in the last month and it has been high.  From you and Dr. Vella Kohler

## 2016-04-29 NOTE — Telephone Encounter (Signed)
Pt wife is asking if we could fit her husband in sometime this week ( he has an appt 31st) but he is has having severe cramps all over his body and his diabetes is out of control. Please advise and see if there is somewhere to put him or something they can do.

## 2016-04-29 NOTE — Telephone Encounter (Signed)
He can go to urgent care or ER if feeling badly so they can get labs done and give him fluids if needed Please do ask him to get that K+ rechecked (orders in) today Appt with me Wednesday is I think the best we can do (last appt of morning and I'll work through lunch) Increase daily insulin an additional FIVE units (so if he's on 45 units a day, go to 50 units a day, for example)

## 2016-05-08 ENCOUNTER — Other Ambulatory Visit
Admission: RE | Admit: 2016-05-08 | Discharge: 2016-05-08 | Disposition: A | Discharge status: Home / Self Care | Payer: Medicare Other | Source: Ambulatory Visit | Attending: Family Medicine | Admitting: Family Medicine

## 2016-05-08 ENCOUNTER — Encounter: Payer: Self-pay | Admitting: Family Medicine

## 2016-05-08 ENCOUNTER — Ambulatory Visit (INDEPENDENT_AMBULATORY_CARE_PROVIDER_SITE_OTHER): Payer: Medicare Other | Admitting: Family Medicine

## 2016-05-08 VITALS — BP 124/78 | HR 105 | Temp 98.1°F | Resp 20 | Ht 70.0 in | Wt 234.1 lb

## 2016-05-08 DIAGNOSIS — I482 Chronic atrial fibrillation, unspecified: Secondary | ICD-10-CM

## 2016-05-08 DIAGNOSIS — R600 Localized edema: Secondary | ICD-10-CM | POA: Diagnosis not present

## 2016-05-08 DIAGNOSIS — M10049 Idiopathic gout, unspecified hand: Secondary | ICD-10-CM

## 2016-05-08 DIAGNOSIS — I4891 Unspecified atrial fibrillation: Secondary | ICD-10-CM

## 2016-05-08 DIAGNOSIS — R252 Cramp and spasm: Secondary | ICD-10-CM | POA: Diagnosis not present

## 2016-05-08 DIAGNOSIS — D649 Anemia, unspecified: Secondary | ICD-10-CM | POA: Diagnosis not present

## 2016-05-08 DIAGNOSIS — I2510 Atherosclerotic heart disease of native coronary artery without angina pectoris: Secondary | ICD-10-CM

## 2016-05-08 DIAGNOSIS — Z794 Long term (current) use of insulin: Secondary | ICD-10-CM

## 2016-05-08 DIAGNOSIS — E1165 Type 2 diabetes mellitus with hyperglycemia: Secondary | ICD-10-CM

## 2016-05-08 DIAGNOSIS — R5383 Other fatigue: Secondary | ICD-10-CM | POA: Insufficient documentation

## 2016-05-08 DIAGNOSIS — G4700 Insomnia, unspecified: Secondary | ICD-10-CM | POA: Diagnosis not present

## 2016-05-08 DIAGNOSIS — F1020 Alcohol dependence, uncomplicated: Secondary | ICD-10-CM

## 2016-05-08 DIAGNOSIS — K7030 Alcoholic cirrhosis of liver without ascites: Secondary | ICD-10-CM | POA: Insufficient documentation

## 2016-05-08 HISTORY — DX: Unspecified atrial fibrillation: I48.91

## 2016-05-08 LAB — COMPREHENSIVE METABOLIC PANEL
ALK PHOS: 94 U/L (ref 38–126)
ALT: 42 U/L (ref 17–63)
ANION GAP: 13 (ref 5–15)
AST: 50 U/L — ABNORMAL HIGH (ref 15–41)
Albumin: 4.1 g/dL (ref 3.5–5.0)
BILIRUBIN TOTAL: 0.8 mg/dL (ref 0.3–1.2)
BUN: 33 mg/dL — ABNORMAL HIGH (ref 6–20)
CALCIUM: 9.7 mg/dL (ref 8.9–10.3)
CO2: 30 mmol/L (ref 22–32)
CREATININE: 1.31 mg/dL — AB (ref 0.61–1.24)
Chloride: 100 mmol/L — ABNORMAL LOW (ref 101–111)
GFR, EST NON AFRICAN AMERICAN: 56 mL/min — AB (ref >60)
Glucose, Bld: 147 mg/dL — ABNORMAL HIGH (ref 65–99)
Potassium: 4.8 mmol/L (ref 3.5–5.1)
SODIUM: 143 mmol/L (ref 135–145)
TOTAL PROTEIN: 7.7 g/dL (ref 6.5–8.1)

## 2016-05-08 LAB — CORTISOL: CORTISOL PLASMA: 17 ug/dL

## 2016-05-08 LAB — MAGNESIUM: Magnesium: 1.7 mg/dL (ref 1.7–2.4)

## 2016-05-08 LAB — CBC WITH DIFFERENTIAL/PLATELET
BASOS ABS: 0.1 10*3/uL (ref 0–0.1)
EOS ABS: 0.2 10*3/uL (ref 0–0.7)
Eosinophils Relative: 2 %
HEMATOCRIT: 36.2 % — AB (ref 40.0–52.0)
HEMOGLOBIN: 11.4 g/dL — AB (ref 13.0–18.0)
Lymphocytes Relative: 14 %
Lymphs Abs: 1.7 10*3/uL (ref 1.0–3.6)
MCH: 24.4 pg — ABNORMAL LOW (ref 26.0–34.0)
MCHC: 31.5 g/dL — AB (ref 32.0–36.0)
MCV: 77.4 fL — ABNORMAL LOW (ref 80.0–100.0)
Monocytes Absolute: 1 10*3/uL (ref 0.2–1.0)
NEUTROS ABS: 9.6 10*3/uL — AB (ref 1.4–6.5)
Platelets: 160 10*3/uL (ref 150–440)
RBC: 4.68 MIL/uL (ref 4.40–5.90)
RDW: 15.7 % — ABNORMAL HIGH (ref 11.5–14.5)
WBC: 12.7 10*3/uL — AB (ref 3.8–10.6)

## 2016-05-08 LAB — URIC ACID: URIC ACID, SERUM: 8.6 mg/dL — AB (ref 4.4–7.6)

## 2016-05-08 LAB — FOLATE: Folate: 17.4 ng/mL (ref >5.9)

## 2016-05-08 LAB — VITAMIN B12: VITAMIN B 12: 429 pg/mL (ref 180–914)

## 2016-05-08 LAB — BRAIN NATRIURETIC PEPTIDE: B NATRIURETIC PEPTIDE 5: 228 pg/mL — AB (ref 0.0–100.0)

## 2016-05-08 MED ORDER — INSULIN GLARGINE 100 UNIT/ML ~~LOC~~ SOLN: 55.0000 [IU] | Freq: Every day | SUBCUTANEOUS | Status: DC

## 2016-05-08 NOTE — Progress Notes (Signed)
BP 124/78 mmHg  Pulse 105  Temp(Src) 98.1 F (36.7 C)  Resp 20  Ht '5\' 10"'$  (1.778 m)  Wt 234 lb 1 oz (106.17 kg)  BMI 33.58 kg/m2  SpO2 94%   Subjective:    Patient ID: Marvin Lewis, male    DOB: 25-Jan-1951, 65 y.o.   MRN: [id number]  HPI: Marvin Lewis is a 65 y.o. male  Chief Complaint  Patient presents with  . Diabetes  . body Marvin   Patient here with his wife Marvin Lewis; legs, calves and foot, toes are curling up; no facial involvement; no eyelid blepherospasm He is urinating okay; he goes a lot; has prostate issues too;  He has seen a kidney specialist when he is in the hospital, but not seeing one on an outpatient basis Marvin going on for 6 weeks ago Saw Dr. Raul Del first and he went right to Dr. Ubaldo Glassing Dr. Ubaldo Glassing knows about the Marvin; Dr. Raul Del has been following his potassium Dr. Raul Del found K+ was 5.3 and then 5.6; going to see him soon Leg edema  Prostate issues, patient politely declined exam; slightly enlarged; urine frequency little bit at a time; no burning; no odd smell; clear; nocturia every hour Oxygen has dropped; [place] in [place] sand particulates; [place]; bad coughing fit; has been sick every since; pulse ox 85% [place] the beach; couldn't get his breath; up to 89 or 90%; saw Dr. Raul Del and [facility] Cloudiness and antibiotics and maybe steroids Cloudiness was still there, did not go away; cough and low grade fever went away Atrial fibrillation; on blood thinner Due for GI, hepatitis C and cirrhosis follow-up He continues to smoke and drink alcohol  Depression screen [facility] 2/9 05/08/2016 04/10/2016  Decreased Interest 0 0  Down, Depressed, Hopeless 0 0  PHQ - 2 Score 0 0   Relevant past medical, surgical, family and social history reviewed Past Medical History  Diagnosis Date  . Pneumonia   . GERD (gastroesophageal reflux disease)   . Depression   . Ascending aortic  aneurysm ([place])   . Ascending aortic aneurysm ([place])   . CAD (coronary artery disease)   . Hepatitis C   . Cirrhosis ([place])   . Thrombocytopenia ([place])   . Alcohol abuse   . History of smoking   . Onychogryphosis   . Insomnia   . Hypoxemia   . 1St degree AV block   . Broken rib April 2016    History of broken ribs x 3  . History of diverticulitis     ruptured and sepsis  . Diabetes mellitus without complication ([place])   . Alcohol use ([place])   . Chronic alcoholism ([place])   . Movement disorder   . COPD (chronic obstructive pulmonary disease) ([place])   . Emphysema of lung ([place])   . Hyperlipidemia   . Hypertension   . Hip arthritis   . Closed nondisplaced fracture of fifth metatarsal bone of left foot Nov. 2016    left  . Sleep apnea     uses O2 '@2'$  L/min at night  . Stroke ([place])   . History of hiatal hernia   . HOH (hard of hearing)   . Seasonal allergies   . Esophageal rupture   . Rib fracture   . Bronchitis   . Atrial fibrillation ([place]) 05/08/2016   Past Surgical History  Procedure Laterality Date  . Peg placement N/A   .  Peg tube removal N/A   . Tracheostomy N/A   . Cholecystectomy    . Pilonidal cyst excision N/A   . Hernia repair    . Colon resection    . Cataract extraction w/phaco Right 01/16/2016    Procedure: CATARACT EXTRACTION PHACO AND INTRAOCULAR LENS PLACEMENT (IOC);  Surgeon: Birder Robson, MD;  Location: [facility] ORS;  Service: Ophthalmology;  Laterality: Right;  [place]: 00:45.0 AP%: 25.2 CDE: 11.36  Lot #[id number]  . Electrophysiologic study N/A 05/22/2016    Procedure: CARDIOVERSION;  Surgeon: Teodoro Spray, MD;  Location: [facility] ORS;  Service: Cardiovascular;  Laterality: N/A;   Family History  Problem Relation Age of Onset  . Coronary artery disease Mother   . Hypertension Mother   . Heart disease Mother   . Lung disease Mother   . Coronary artery disease Father   . Hypertension Father   . Diabetes Father   . Heart disease Father   . Cancer Father      prostate  . Alcohol abuse Brother    Social History  Substance Use Topics  . Smoking status: Current Every Day Smoker -- 0.25 packs/day for 40 years    Types: Cigarettes  . Smokeless tobacco: Never Used  . Alcohol Use: 0.0 oz/week    0 Standard drinks or equivalent per week     Comment: patient states he no longer drinks liquor, states he drinks very little  MD note: he does drink alcohol  Interim medical history since last visit reviewed. Allergies and medications reviewed  Review of Systems Per HPI unless specifically indicated above     Objective:    BP 124/78 mmHg  Pulse 105  Temp(Src) 98.1 F (36.7 C)  Resp 20  Ht '5\' 10"'$  (1.778 m)  Wt 234 lb 1 oz (106.17 kg)  BMI 33.58 kg/m2  SpO2 94%  Wt Readings from Last 3 Encounters:  05/22/16 231 lb 3.2 oz (104.872 kg)  05/08/16 234 lb 1 oz (106.17 kg)  04/10/16 232 lb (105.235 kg)    Physical Exam  Constitutional: He appears well-developed and well-nourished. No distress.  Obese; weight up 2 pounds over last 4 weeks  HENT:  Head: Normocephalic and atraumatic.  Voice is hoarse  Eyes: EOM are normal. No scleral icterus.  Neck: No JVD present. No thyromegaly present.  Cardiovascular: Normal rate.  An irregularly irregular rhythm present.  Pulmonary/Chest: Effort normal. He has no decreased breath sounds. He has no wheezes. He has no rhonchi. He has no rales.  Abdominal: Soft. Bowel sounds are normal. He exhibits no distension.  Musculoskeletal: He exhibits no edema.  Neurological: Coordination normal.  Jerky movements of head and upper extremities; restless, hyperactive  Skin: Skin is warm and dry. No pallor.  Scale of soles of both feet  Psychiatric: He has a normal mood and affect. His behavior is normal. Judgment and thought content normal. His mood appears not anxious. He does not exhibit a depressed mood.  Good eye contact with examiner   Results for orders placed or performed in visit on 04/22/16  Comprehensive  metabolic panel  Result Value Ref Range   Glucose 240 (H) 65 - 99 mg/dL   BUN 25 8 - 27 mg/dL   Creatinine, Ser 1.28 (H) 0.76 - 1.27 mg/dL   GFR calc non Af Amer 59 (L) >59 mL/min/1.73   GFR calc Af Amer 68 >59 mL/min/1.73   BUN/Creatinine Ratio 20 10 - 24   Sodium 142 134 - 144 mmol/L   Potassium  5.6 (H) 3.5 - 5.2 mmol/L   Chloride 99 96 - 106 mmol/L   CO2 25 18 - 29 mmol/L   Calcium 9.3 8.6 - 10.2 mg/dL   Total Protein 7.0 6.0 - 8.5 g/dL   Albumin 4.1 3.6 - 4.8 g/dL   Globulin, Total 2.9 1.5 - 4.5 g/dL   Albumin/Globulin Ratio 1.4 1.2 - 2.2   Bilirubin Total 0.5 0.0 - 1.2 mg/dL   Alkaline Phosphatase 100 39 - 117 IU/L   AST 25 0 - 40 IU/L   ALT 35 0 - 44 IU/L  CBC with Differential/Platelet  Result Value Ref Range   WBC 9.2 3.4 - 10.8 x10E3/uL   RBC 4.54 4.14 - 5.80 x10E6/uL   Hemoglobin 11.2 (L) 12.6 - 17.7 g/dL   Hematocrit 37.3 (L) 37.5 - 51.0 %   MCV 82 79 - 97 fL   MCH 24.7 (L) 26.6 - 33.0 pg   MCHC 30.0 (L) 31.5 - 35.7 g/dL   RDW 14.8 12.3 - 15.4 %   Platelets 140 (L) 150 - 379 x10E3/uL   Neutrophils 69 %   Lymphs 19 %   Monocytes 7 %   Eos 4 %   Basos 1 %   Neutrophils Absolute 6.3 1.4 - 7.0 x10E3/uL   Lymphocytes Absolute 1.8 0.7 - 3.1 x10E3/uL   Monocytes Absolute 0.7 0.1 - 0.9 x10E3/uL   EOS (ABSOLUTE) 0.4 0.0 - 0.4 x10E3/uL   Basophils Absolute 0.1 0.0 - 0.2 x10E3/uL   Immature Granulocytes 0 %   Immature Grans (Abs) 0.0 0.0 - 0.1 x10E3/uL  Lipid Panel w/o Chol/HDL Ratio  Result Value Ref Range   Cholesterol, Total 139 100 - 199 mg/dL   Triglycerides 138 0 - 149 mg/dL   HDL 46 >39 mg/dL   VLDL Cholesterol Cal 28 5 - 40 mg/dL   LDL Calculated 65 0 - 99 mg/dL  Hgb A1c w/o eAG  Result Value Ref Range   Hgb A1c MFr Bld 9.9 (H) 4.8 - 5.6 %  TSH  Result Value Ref Range   TSH 1.320 0.450 - 4.500 uIU/mL      Assessment & Plan:   Problem List Items Addressed This Visit      Cardiovascular and Mediastinum   Atrial fibrillation (HCC)   Relevant  Medications   atorvastatin (LIPITOR) 10 MG tablet   Other Relevant Orders   B Nat Peptide     Digestive   Cirrhosis (HCC) - Primary    Check labs; patient avers that he can see GI      Relevant Orders   Comprehensive metabolic panel   Ambulatory referral to Gastroenterology     Endocrine   Type II diabetes mellitus, uncontrolled ([place])    Refer to endo      Relevant Medications   insulin glargine (LANTUS) 100 UNIT/ML injection   atorvastatin (LIPITOR) 10 MG tablet   Other Relevant Orders   Ambulatory referral to Endocrinology   Basic metabolic panel     Other   Anemia   Relevant Orders   Ambulatory referral to Gastroenterology   Chronic alcoholism ([place])    Check labs; encouraged cessation      Relevant Orders   Comprehensive metabolic panel   Vitamin E33   Folate   Ambulatory referral to Gastroenterology   Ambulatory referral to Neurology   Gout    Uric acid      Relevant Orders   Uric acid   Uric acid   Insomnia    He  has not talked with his neurologist about his problem apparently; his wife says he is drinking to help him sleep; I urged him to see neurologist and quit drinking; referral entered      Relevant Orders   Ambulatory referral to Neurology   Leg Marvin   Relevant Orders   Magnesium   Ambulatory referral to Neurology   Magnesium    Other Visit Diagnoses    Bilateral leg edema        Other fatigue        Relevant Orders    CBC with Differential/Platelet    Cortisol       Follow up plan: No Follow-up on file.  An after-visit summary was printed and given to the patient at [place].  Please see the patient instructions which may contain other information and recommendations beyond what is mentioned above in the assessment and plan.  Meds ordered this encounter  Medications  . insulin glargine (LANTUS) 100 UNIT/ML injection    Sig: Inject 0.55 mLs (55 Units total) into the skin at bedtime. Or as directed    Dispense:  5 vial     Refill:  1    new instructions, please do the math, sorry out of time  . atorvastatin (LIPITOR) 10 MG tablet    Sig: Take 1 tablet (10 mg total) by mouth at bedtime.    Dispense:  30 tablet    Refill:  2    Orders Placed This Encounter  Procedures  . Magnesium  . Comprehensive metabolic panel  . CBC with Differential/Platelet  . B Nat Peptide  . Cortisol  . Vitamin B12  . Folate  . Uric acid  . Basic metabolic panel  . Magnesium  . Uric acid  . Ambulatory referral to Endocrinology  . Ambulatory referral to Gastroenterology  . Ambulatory referral to Neurology   --------------- Addendum; phone call 05/09/16, 1229 hours I spoke with patient, wanting to stop statin; he does not want to Patient's Marvin are much better after taking magnesium; he took it BID yesterday, and had no Marvin whatsoever last night; advised only take it short term because of kidney function Recheck BMP (mail orders) Uric acid up; may be renal etiology, but also alcohol; limit or stop alcohol, [place], gravies, chicken soup, etc. RBCs are small, see GI right away, could be losing blood; liver enzyme back up; see GI BNP up, wife wrote down number; on their way to doctor right now

## 2016-05-08 NOTE — Assessment & Plan Note (Signed)
Uric acid

## 2016-05-08 NOTE — Assessment & Plan Note (Signed)
Check labs; patient avers that he can see GI

## 2016-05-08 NOTE — Assessment & Plan Note (Signed)
Refer to endo 

## 2016-05-08 NOTE — Patient Instructions (Addendum)
Magnesium oxide 500 mg, take one immediately and another 4-6 hours later We'll contact you about labs I'll see about talking with your heart and lung doctors We'll refer you to the endocrinologist Increase the long-acting insulin to 55 units once a day Automatically give 5 units of short-acting insulin with each meal unless under 150 I will reach you at [phone] with lab results

## 2016-05-08 NOTE — Assessment & Plan Note (Signed)
Check labs; encouraged cessation

## 2016-05-08 NOTE — Assessment & Plan Note (Signed)
He has not talked with his neurologist about his problem apparently; his wife says he is drinking to help him sleep; I urged him to see neurologist and quit drinking; referral entered

## 2016-05-09 ENCOUNTER — Other Ambulatory Visit: Payer: Self-pay | Admitting: Family Medicine

## 2016-05-09 DIAGNOSIS — E1165 Type 2 diabetes mellitus with hyperglycemia: Secondary | ICD-10-CM

## 2016-05-09 DIAGNOSIS — I712 Thoracic aortic aneurysm, without rupture: Secondary | ICD-10-CM | POA: Diagnosis not present

## 2016-05-09 DIAGNOSIS — M10049 Idiopathic gout, unspecified hand: Secondary | ICD-10-CM

## 2016-05-09 DIAGNOSIS — Z794 Long term (current) use of insulin: Principal | ICD-10-CM

## 2016-05-09 DIAGNOSIS — R0609 Other forms of dyspnea: Secondary | ICD-10-CM | POA: Diagnosis not present

## 2016-05-09 DIAGNOSIS — J431 Panlobular emphysema: Secondary | ICD-10-CM | POA: Diagnosis not present

## 2016-05-09 DIAGNOSIS — Z720 Tobacco use: Secondary | ICD-10-CM | POA: Diagnosis not present

## 2016-05-09 DIAGNOSIS — R252 Cramp and spasm: Secondary | ICD-10-CM

## 2016-05-15 ENCOUNTER — Other Ambulatory Visit: Payer: Self-pay | Admitting: Family Medicine

## 2016-05-15 MED ORDER — OMEPRAZOLE 20 MG PO CPDR
20.0000 mg | DELAYED_RELEASE_CAPSULE | Freq: Every day | ORAL | Status: DC
Start: 2016-05-15 — End: 2017-09-05

## 2016-05-17 ENCOUNTER — Other Ambulatory Visit: Payer: Self-pay | Admitting: Cardiology

## 2016-05-17 DIAGNOSIS — I10 Essential (primary) hypertension: Secondary | ICD-10-CM | POA: Diagnosis not present

## 2016-05-17 DIAGNOSIS — I481 Persistent atrial fibrillation: Secondary | ICD-10-CM | POA: Diagnosis not present

## 2016-05-17 DIAGNOSIS — I712 Thoracic aortic aneurysm, without rupture: Secondary | ICD-10-CM | POA: Diagnosis not present

## 2016-05-17 DIAGNOSIS — J431 Panlobular emphysema: Secondary | ICD-10-CM | POA: Diagnosis not present

## 2016-05-17 DIAGNOSIS — E119 Type 2 diabetes mellitus without complications: Secondary | ICD-10-CM | POA: Diagnosis not present

## 2016-05-17 DIAGNOSIS — I4891 Unspecified atrial fibrillation: Secondary | ICD-10-CM | POA: Diagnosis not present

## 2016-05-21 ENCOUNTER — Inpatient Hospital Stay
Admission: AD | Admit: 2016-05-21 | Discharge: 2016-05-22 | DRG: 310 | Disposition: A | Discharge status: Home / Self Care | Payer: Medicare Other | Source: Ambulatory Visit | Attending: Cardiology | Admitting: Cardiology

## 2016-05-21 DIAGNOSIS — G4730 Sleep apnea, unspecified: Secondary | ICD-10-CM | POA: Diagnosis not present

## 2016-05-21 DIAGNOSIS — J3800 Paralysis of vocal cords and larynx, unspecified: Secondary | ICD-10-CM | POA: Diagnosis present

## 2016-05-21 DIAGNOSIS — K219 Gastro-esophageal reflux disease without esophagitis: Secondary | ICD-10-CM | POA: Diagnosis present

## 2016-05-21 DIAGNOSIS — R1310 Dysphagia, unspecified: Secondary | ICD-10-CM | POA: Diagnosis present

## 2016-05-21 DIAGNOSIS — I2510 Atherosclerotic heart disease of native coronary artery without angina pectoris: Secondary | ICD-10-CM | POA: Diagnosis present

## 2016-05-21 DIAGNOSIS — I4891 Unspecified atrial fibrillation: Secondary | ICD-10-CM | POA: Diagnosis not present

## 2016-05-21 DIAGNOSIS — Z7901 Long term (current) use of anticoagulants: Secondary | ICD-10-CM

## 2016-05-21 DIAGNOSIS — J449 Chronic obstructive pulmonary disease, unspecified: Secondary | ICD-10-CM | POA: Diagnosis present

## 2016-05-21 DIAGNOSIS — I429 Cardiomyopathy, unspecified: Secondary | ICD-10-CM | POA: Diagnosis present

## 2016-05-21 DIAGNOSIS — Z96642 Presence of left artificial hip joint: Secondary | ICD-10-CM | POA: Diagnosis present

## 2016-05-21 DIAGNOSIS — Z794 Long term (current) use of insulin: Secondary | ICD-10-CM

## 2016-05-21 DIAGNOSIS — B1920 Unspecified viral hepatitis C without hepatic coma: Secondary | ICD-10-CM | POA: Diagnosis present

## 2016-05-21 DIAGNOSIS — F17200 Nicotine dependence, unspecified, uncomplicated: Secondary | ICD-10-CM | POA: Diagnosis present

## 2016-05-21 DIAGNOSIS — I499 Cardiac arrhythmia, unspecified: Secondary | ICD-10-CM | POA: Diagnosis present

## 2016-05-21 DIAGNOSIS — E1151 Type 2 diabetes mellitus with diabetic peripheral angiopathy without gangrene: Secondary | ICD-10-CM | POA: Diagnosis present

## 2016-05-21 DIAGNOSIS — E119 Type 2 diabetes mellitus without complications: Secondary | ICD-10-CM | POA: Diagnosis not present

## 2016-05-21 DIAGNOSIS — I10 Essential (primary) hypertension: Secondary | ICD-10-CM | POA: Diagnosis present

## 2016-05-21 DIAGNOSIS — I480 Paroxysmal atrial fibrillation: Secondary | ICD-10-CM | POA: Diagnosis not present

## 2016-05-21 DIAGNOSIS — Z79899 Other long term (current) drug therapy: Secondary | ICD-10-CM

## 2016-05-21 DIAGNOSIS — F1010 Alcohol abuse, uncomplicated: Secondary | ICD-10-CM | POA: Diagnosis present

## 2016-05-21 LAB — CBC WITH DIFFERENTIAL/PLATELET
BASOS ABS: 0.1 10*3/uL (ref 0–0.1)
BASOS PCT: 1 %
EOS ABS: 0.3 10*3/uL (ref 0–0.7)
Eosinophils Relative: 4 %
HEMATOCRIT: 33.5 % — AB (ref 40.0–52.0)
HEMOGLOBIN: 10.6 g/dL — AB (ref 13.0–18.0)
Lymphocytes Relative: 16 %
Lymphs Abs: 1.3 10*3/uL (ref 1.0–3.6)
MCH: 24.3 pg — ABNORMAL LOW (ref 26.0–34.0)
MCHC: 31.5 g/dL — AB (ref 32.0–36.0)
MCV: 77 fL — ABNORMAL LOW (ref 80.0–100.0)
MONOS PCT: 10 %
Monocytes Absolute: 0.8 10*3/uL (ref 0.2–1.0)
NEUTROS ABS: 5.6 10*3/uL (ref 1.4–6.5)
NEUTROS PCT: 69 %
Platelets: 154 10*3/uL (ref 150–440)
RBC: 4.35 MIL/uL — AB (ref 4.40–5.90)
RDW: 16.5 % — ABNORMAL HIGH (ref 11.5–14.5)
WBC: 8.2 10*3/uL (ref 3.8–10.6)

## 2016-05-21 LAB — GLUCOSE, CAPILLARY
GLUCOSE-CAPILLARY: 149 mg/dL — AB (ref 65–99)
GLUCOSE-CAPILLARY: 246 mg/dL — AB (ref 65–99)
GLUCOSE-CAPILLARY: 188 mg/dL — AB (ref 65–99)
Glucose-Capillary: 173 mg/dL — ABNORMAL HIGH (ref 65–99)

## 2016-05-21 LAB — BASIC METABOLIC PANEL
ANION GAP: 8 (ref 5–15)
BUN: 31 mg/dL — ABNORMAL HIGH (ref 6–20)
CALCIUM: 8.8 mg/dL — AB (ref 8.9–10.3)
CO2: 25 mmol/L (ref 22–32)
Chloride: 104 mmol/L (ref 101–111)
Creatinine, Ser: 1.34 mg/dL — ABNORMAL HIGH (ref 0.61–1.24)
GFR, EST NON AFRICAN AMERICAN: 54 mL/min — AB (ref >60)
GLUCOSE: 198 mg/dL — AB (ref 65–99)
POTASSIUM: 4.8 mmol/L (ref 3.5–5.1)
SODIUM: 137 mmol/L (ref 135–145)

## 2016-05-21 LAB — MAGNESIUM: MAGNESIUM: 1.7 mg/dL (ref 1.7–2.4)

## 2016-05-21 MED ORDER — BENAZEPRIL HCL 20 MG PO TABS
20.0000 mg | ORAL_TABLET | Freq: Every day | ORAL | Status: DC
  Administered 2016-05-22: 20 mg via ORAL
  Filled 2016-05-21: qty 1

## 2016-05-21 MED ORDER — ONDANSETRON HCL 4 MG/2ML IJ SOLN: 4.0000 mg | Freq: Four times a day (QID) | INTRAMUSCULAR | Status: DC | PRN

## 2016-05-21 MED ORDER — AMLODIPINE BESYLATE 5 MG PO TABS
5.0000 mg | ORAL_TABLET | Freq: Every day | ORAL | Status: DC
  Administered 2016-05-22: 5 mg via ORAL
  Filled 2016-05-21: qty 1

## 2016-05-21 MED ORDER — ALBUTEROL SULFATE (2.5 MG/3ML) 0.083% IN NEBU
2.5000 mg | INHALATION_SOLUTION | Freq: Four times a day (QID) | RESPIRATORY_TRACT | Status: DC | PRN
  Administered 2016-05-21 – 2016-05-22 (×3): 2.5 mg via RESPIRATORY_TRACT
  Filled 2016-05-21 (×3): qty 3

## 2016-05-21 MED ORDER — NICOTINE 14 MG/24HR TD PT24
14.0000 mg | MEDICATED_PATCH | Freq: Every day | TRANSDERMAL | Status: DC
  Administered 2016-05-21 – 2016-05-22 (×2): 14 mg via TRANSDERMAL
  Filled 2016-05-21 (×2): qty 1

## 2016-05-21 MED ORDER — ALPRAZOLAM 0.25 MG PO TABS
0.2500 mg | ORAL_TABLET | Freq: Three times a day (TID) | ORAL | Status: DC | PRN
  Administered 2016-05-21: 0.25 mg via ORAL
  Filled 2016-05-21: qty 1

## 2016-05-21 MED ORDER — FUROSEMIDE 20 MG PO TABS
20.0000 mg | ORAL_TABLET | Freq: Every day | ORAL | Status: DC
  Administered 2016-05-22: 20 mg via ORAL
  Filled 2016-05-21: qty 1

## 2016-05-21 MED ORDER — INSULIN ASPART 100 UNIT/ML ~~LOC~~ SOLN: 0.0000 [IU] | Freq: Every day | SUBCUTANEOUS | Status: DC

## 2016-05-21 MED ORDER — SODIUM CHLORIDE 0.9 % IV SOLN
250.0000 mL | INTRAVENOUS | Status: DC
  Administered 2016-05-22: 08:00:00 via INTRAVENOUS

## 2016-05-21 MED ORDER — MELOXICAM 7.5 MG PO TABS
7.5000 mg | ORAL_TABLET | Freq: Two times a day (BID) | ORAL | Status: DC
  Administered 2016-05-21 – 2016-05-22 (×3): 7.5 mg via ORAL
  Filled 2016-05-21 (×3): qty 1

## 2016-05-21 MED ORDER — PANTOPRAZOLE SODIUM 40 MG PO TBEC
40.0000 mg | DELAYED_RELEASE_TABLET | Freq: Every day | ORAL | Status: DC
  Administered 2016-05-22: 40 mg via ORAL
  Filled 2016-05-21: qty 1

## 2016-05-21 MED ORDER — FLUTICASONE FUROATE-VILANTEROL 100-25 MCG/INH IN AEPB
1.0000 | INHALATION_SPRAY | Freq: Every day | RESPIRATORY_TRACT | Status: DC
  Administered 2016-05-21 – 2016-05-22 (×2): 1 via RESPIRATORY_TRACT
  Filled 2016-05-21: qty 28

## 2016-05-21 MED ORDER — SODIUM CHLORIDE 0.9% FLUSH
3.0000 mL | Freq: Two times a day (BID) | INTRAVENOUS | Status: DC
  Administered 2016-05-21 – 2016-05-22 (×3): 3 mL via INTRAVENOUS

## 2016-05-21 MED ORDER — SODIUM CHLORIDE 0.9% FLUSH: 3.0000 mL | INTRAVENOUS | Status: DC | PRN

## 2016-05-21 MED ORDER — APIXABAN 5 MG PO TABS
5.0000 mg | ORAL_TABLET | Freq: Two times a day (BID) | ORAL | Status: DC
  Administered 2016-05-21 – 2016-05-22 (×2): 5 mg via ORAL
  Filled 2016-05-21 (×2): qty 1

## 2016-05-21 MED ORDER — ATORVASTATIN CALCIUM 10 MG PO TABS
10.0000 mg | ORAL_TABLET | Freq: Every day | ORAL | Status: DC
  Administered 2016-05-21: 10 mg via ORAL
  Filled 2016-05-21: qty 1

## 2016-05-21 MED ORDER — INSULIN GLARGINE 100 UNIT/ML ~~LOC~~ SOLN
55.0000 [IU] | Freq: Every day | SUBCUTANEOUS | Status: DC
  Administered 2016-05-21: 55 [IU] via SUBCUTANEOUS
  Filled 2016-05-21 (×2): qty 0.55

## 2016-05-21 MED ORDER — SOTALOL HCL 80 MG PO TABS
80.0000 mg | ORAL_TABLET | Freq: Three times a day (TID) | ORAL | Status: DC
  Administered 2016-05-21 – 2016-05-22 (×4): 80 mg via ORAL
  Filled 2016-05-21 (×5): qty 1

## 2016-05-21 MED ORDER — INSULIN GLARGINE 100 UNIT/ML ~~LOC~~ SOLN: 55.0000 [IU] | Freq: Every day | SUBCUTANEOUS | Status: DC

## 2016-05-21 MED ORDER — ACETAMINOPHEN 325 MG PO TABS
650.0000 mg | ORAL_TABLET | ORAL | Status: DC | PRN
  Administered 2016-05-22: 650 mg via ORAL
  Filled 2016-05-21: qty 2

## 2016-05-21 MED ORDER — INSULIN ASPART 100 UNIT/ML ~~LOC~~ SOLN
0.0000 [IU] | Freq: Three times a day (TID) | SUBCUTANEOUS | Status: DC
  Administered 2016-05-21: 3 [IU] via SUBCUTANEOUS
  Administered 2016-05-21 – 2016-05-22 (×2): 2 [IU] via SUBCUTANEOUS
  Filled 2016-05-21: qty 2
  Filled 2016-05-21: qty 3
  Filled 2016-05-21: qty 2

## 2016-05-21 NOTE — Progress Notes (Signed)
Patient had no insulin coverage. Notified Dr. Ubaldo Glassing. Per MD okay to add CBG checks ac/hs and sliding scale. Will continue to monitor. Horton Finer

## 2016-05-21 NOTE — Progress Notes (Signed)
Patient complaining of lower back pain 7/10. Patient requesting nicotine patch and albuterol breathing treatments. Notified Dr. Ubaldo Glassing. Per MD okay to order Mobic 7.'5mg'$  BID, nicotine patch '14mg'$ , and albuterol neb treatments. Will continue to monitor. Horton Finer

## 2016-05-21 NOTE — H&P (Signed)
Chief Complaint: Chief Complaint  Patient presents with  . Follow-up  4 weeks per note ? cardioversion Im doing okay having a good day  . Edema  does have some in feet ankles lower legs  Date of Service: 05/17/2016 Date of Birth: [birth year] PCP: Satira Anis LADA, MD  History of Present Illness: Mr. Colglazier is a 65 y.o.male patient who returns for follow-up visit. Was seen previously with volume overload and peripheral edema. He was noted to be in atrial fibrillation which was not previously noted. Echocardiogram showed an EF of 50% with mild MR and mild TR. Chest x-ray revealed no active disease. EKG today revealed atrial fibrillation at a rate of 70 with a QRS duration of 80 ms and a QTC of 412 ms. He has improved somewhat with diuresis. He is currently on furosemide at 20 mg daily. He placed on Eliquis for anticoagulation and has remained on this for greater than 4 weeks. He returns now for follow-up visit. He still has symptomatic atrial fibrillation. He complains of weakness and fatigue. EKG today reveals A. fib at a rate of 69 with a QRS duration of 76 ms a QTC of 417 ms and a QRS axis -6. There is no ischemia. Past Medical and Surgical History  Past Medical History Past Medical History:  Diagnosis Date  . Alcohol abuse  . Alcoholic pancreatitis  history of  . Atrial fibrillation , unspecified (CMS-HCC)  . Cardiomyopathy, secondary (CMS-HCC)  EF 45%  . COPD (chronic obstructive pulmonary disease) , unspecified (CMS-HCC)  . Diabetes mellitus type 2, uncomplicated (CMS-HCC)  . Diverticulitis  . Dysphagia, unspecified  . GERD (gastroesophageal reflux disease)  . Hepatitis  C  . History of chicken pox  . History of GI bleed  . History of hypokalemia  and hypomagnesemia  . History of normocytic normochromic anemia  Normal iron, B-12, folate levels. Stools were negative for blood  . Hx of adenomatous colonic polyps  . Hypertension  . Tobacco abuse  . Vocal cord paralysis  due to  cervical spine injury remotely.   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   Medications and Allergies  Current Medications  Current Outpatient Prescriptions  Medication Sig Dispense Refill  . amLODIPine (NORVASC) 5 MG tablet Take 1 tablet (5 mg total) by mouth once daily. 30 tablet 6  . apixaban (ELIQUIS) 5 mg tablet Take 1 tablet (5 mg total) by mouth 2 (two) times daily. 60 tablet 3  . atorvastatin (LIPITOR) 10 MG tablet  . benazepril (LOTENSIN) 20 MG tablet Take 20 mg by mouth once daily.  . fluticasone-vilanterol (BREO ELLIPTA) 100-25 mcg/dose DsDv inhaler Inhale 1 inhalation into the lungs once daily. 1 Inhaler 4  . FUROsemide (LASIX) 20 MG tablet Take 1 tablet (20 mg total) by mouth once daily. 30 tablet 11  . insulin ASPART (NOVOLOG FLEXPEN) 100 unit/mL pen injector Inject subcutaneously 3 (three) times daily before meals.  . insulin GLARGINE (LANTUS) 100 unit/mL injection Inject 55 Units subcutaneously nightly.   . metoprolol tartrate (LOPRESSOR) 25 MG tablet Take 25 mg by mouth 2 (two) times daily.  Marland Kitchen omeprazole (PRILOSEC) 20 MG DR capsule Take 20 mg by mouth once daily.  No current facility-administered medications for this visit.   Allergies: Review of patient's allergies indicates no known allergies.  Social and Family History  Social History reports that he has been smoking. He has a 40.00 pack-year smoking history. He has never used smokeless tobacco. He reports that he drinks alcohol. He reports that he does not use illicit drugs.  Family History Family History   Problem Relation Age of Onset  . Hypertension Mother  . Heart disease Mother  . Hypertension Father  . Heart disease Father  . Diabetes type II Father  . Colon polyps Brother   Review of Systems  Review of Systems  Constitutional: Negative for chills, diaphoresis, fever, malaise/fatigue and weight loss.  HENT: Negative for congestion, ear discharge, hearing loss and tinnitus.  Eyes: Negative for blurred vision.  Respiratory: Positive for shortness of breath. Negative for cough, hemoptysis, sputum production and wheezing.  Cardiovascular: Positive for leg swelling. Negative for chest pain, palpitations, orthopnea, claudication and PND.  Gastrointestinal: Negative for abdominal pain, blood in stool, constipation, diarrhea, heartburn, melena, nausea and vomiting.  Genitourinary: Negative for dysuria, frequency, hematuria and urgency.  Musculoskeletal: Negative for back pain, falls, joint pain and myalgias.  Skin: Negative for itching and rash.  Neurological: Negative for dizziness, tingling, focal weakness, loss of consciousness, weakness and headaches.  Endo/Heme/Allergies: Negative for polydipsia. Does not bruise/bleed easily.  Psychiatric/Behavioral: Negative for depression, memory loss and substance abuse. The patient is not nervous/anxious.    Physical Examination   Vitals: BP 124/72 (BP Location: Left upper arm, Patient Position: Sitting, BP Cuff Size: Adult)  Pulse 68  Resp 12  Ht 180.3 cm ('5\' 11"'$ )  Wt (!) 108.3 kg (238 lb 11.2 oz)  BMI 33.29 kg/m2 Ht:180.3 cm ('5\' 11"'$ ) Wt:(!) 108.3 kg (238 lb 11.2 oz) JQB:HALP surface area is 2.33 meters squared. Body mass index is 33.29 kg/(m^2).  Wt Readings from Last 3 Encounters:  05/17/16 (!) 108.3 kg (238 lb 11.2 oz)  05/09/16 (!) 107.5 kg (237 lb)  04/09/16 (!) 105.7 kg (233 lb)   BP Readings from Last 3 Encounters:  05/17/16 124/72  05/09/16 123/67  04/09/16 130/84   General: Caucasian male in no acute  distress LUNGS Breath Sounds: Normal Percussion: Normal  CARDIOVASCULAR JVP CV wave: no HJR: no Elevation at 90 degrees: None Carotid Pulse: normal pulsation bilaterally Bruit: None Apex: prominent subxiphoid impulse  Auscultation Rhythm: atrial fibrillation S1: normal S2: normal Clicks: no Rub: no Murmurs: no murmurs  Gallop: None ABDOMEN Liver enlargement: no Pulsatile aorta: no Ascites: no Bruits: no  EXTREMITIES Clubbing: no Edema: 1-2 + bilateral pedal edema Pulses: peripheral pulses symmetrical Femoral Bruits: no Amputation: no SKIN Rash: no Cyanosis: no Embolic phemonenon: no Bruising: no NEURO Alert and Oriented to person, place and time: yes Non focal: yes  Assessment and Plan   65 y.o. male with  ICD-10-CM ICD-9-CM  1. Essential hypertension-blood pressure is currently controlled with metoprolol and benazepril. Will continue with this regimen and dash diet. Avoidance of alcohol and tobacco was also discussed. Daily weights are important. Will continue with current regimen including low-sodium diet I10 401.9  2. COPD, moderate-COPD currently stable with no active wheezing. Will continue to avoid tobacco and be active as possible J44.9 496  3. Diabetes mellitus type 2, uncomplicated-diabetes is being controlled with insulin and has an hemoglobin A1c goal of less than 6. Will closely follow this. ADA diet is recommended E11.9 250.00  4. Tobacco abuse-avoidance of tobacco continues to be recommended Z72.0 305.1  5. Cardiomyopathy-EF 50%. This is unchanged from approximately 10 years ago. Low-fat low-cholesterol diet with avoidance of high sodium diet and avoidance of ethanol. Echocardiogram revealed ejection fraction of 50% with mild MR and mild TR.  6. Atrial fibrillation-patient with fairly good rate control. [place]ads score is 1 for hypertension, 1 for diabetes, 1 for heart failure. Will continue with metoprolol for rate control and placed on Eliquis 5 mg  twice daily for anticoagulation. Continue with diuresis with furosemide. Continue with Norvasc and benazepril along with metoprolol for blood pressure control. Will admit next week on June 13 for loading with sotalol followed by elective cardioversion the following day.  Return in about 4 weeks (around 06/14/2016).  These notes generated with voice recognition software. I apologize for typographical errors.  Sydnee Levans, MD

## 2016-05-21 NOTE — Progress Notes (Signed)
Marvin Lewis is a 65 y.o. male patient admitted from ED awake, alert - oriented  X 4 - no acute distress noted.  VSS - Blood pressure 129/64, pulse 67, temperature 98.3 F (36.8 C), temperature source Oral, resp. rate 14, height '5\' 11"'$  (1.803 m), weight 105.643 kg (232 lb 14.4 oz), SpO2 94 %.    IV in place, occlusive dsg intact without redness.  Orientation to room, and floor completed with information packet given to patient/family. Admission INP armband ID verified with patient/family, and in place.   SR up x 2, fall assessment complete, with patient and family able to verbalize understanding of risk associated with falls, and verbalized understanding to call nsg before up out of bed.  Call light within reach, patient able to voice, and demonstrate understanding.  Skin, clean-dry- intact without evidence of bruising, or skin tears.   No evidence of skin break down noted on exam. Skin assessed with Gay Filler, RN. Tele box verified by Janett Billow, RN and [place], [place].      Will cont to eval and treat per MD orders.  Horton Finer, RN 05/21/2016 11:44 AM

## 2016-05-22 ENCOUNTER — Other Ambulatory Visit: Payer: Self-pay

## 2016-05-22 ENCOUNTER — Encounter: Payer: Self-pay | Admitting: Anesthesiology

## 2016-05-22 ENCOUNTER — Encounter
Admission: AD | Disposition: A | Discharge status: Home / Self Care | Payer: Self-pay | Source: Ambulatory Visit | Attending: Cardiology

## 2016-05-22 ENCOUNTER — Ambulatory Visit: Admission: RE | Admit: 2016-05-22 | Payer: Medicare Other | Source: Ambulatory Visit | Admitting: Cardiology

## 2016-05-22 ENCOUNTER — Inpatient Hospital Stay: Payer: Medicare Other | Admitting: Registered Nurse

## 2016-05-22 ENCOUNTER — Other Ambulatory Visit: Payer: Self-pay | Admitting: Family Medicine

## 2016-05-22 DIAGNOSIS — I480 Paroxysmal atrial fibrillation: Secondary | ICD-10-CM | POA: Diagnosis not present

## 2016-05-22 HISTORY — PX: ELECTROPHYSIOLOGIC STUDY: SHX172A

## 2016-05-22 LAB — BASIC METABOLIC PANEL
Anion gap: 6 (ref 5–15)
BUN: 32 mg/dL — AB (ref 6–20)
CALCIUM: 8.8 mg/dL — AB (ref 8.9–10.3)
CO2: 28 mmol/L (ref 22–32)
CREATININE: 1.12 mg/dL (ref 0.61–1.24)
Chloride: 102 mmol/L (ref 101–111)
GFR calc non Af Amer: >60 mL/min (ref >60)
Glucose, Bld: 238 mg/dL — ABNORMAL HIGH (ref 65–99)
Potassium: 4.3 mmol/L (ref 3.5–5.1)
SODIUM: 136 mmol/L (ref 135–145)

## 2016-05-22 LAB — GLUCOSE, CAPILLARY: Glucose-Capillary: 160 mg/dL — ABNORMAL HIGH (ref 65–99)

## 2016-05-22 SURGERY — CARDIOVERSION (CATH LAB)
Anesthesia: General

## 2016-05-22 MED ORDER — PROPOFOL 10 MG/ML IV BOLUS
INTRAVENOUS | Status: DC | PRN
  Administered 2016-05-22: 20 mg via INTRAVENOUS
  Administered 2016-05-22: 50 mg via INTRAVENOUS

## 2016-05-22 MED ORDER — SOTALOL HCL 80 MG PO TABS: 80.0000 mg | ORAL_TABLET | Freq: Two times a day (BID) | ORAL | Status: DC

## 2016-05-22 MED ORDER — INSULIN ASPART 100 UNIT/ML FLEXPEN: PEN_INJECTOR | SUBCUTANEOUS | Status: DC

## 2016-05-22 MED ORDER — SOTALOL HCL 80 MG PO TABS: 80.0000 mg | ORAL_TABLET | Freq: Two times a day (BID) | ORAL | Status: AC

## 2016-05-22 NOTE — Discharge Instructions (Signed)
car

## 2016-05-22 NOTE — Progress Notes (Addendum)
Pt returned from specials on box 15. SB, 50-60s. VSS, in NAD. Pt to discharge later today. Will continue to monitor.

## 2016-05-22 NOTE — Anesthesia Preprocedure Evaluation (Signed)
Anesthesia Evaluation  Patient identified by MRN, date of birth, ID band Patient awake    Reviewed: Allergy & Precautions, H&P , NPO status , Patient's Chart, lab work & pertinent test results, reviewed documented beta blocker date and time   History of Anesthesia Complications Negative for: history of anesthetic complications  Airway Mallampati: II  TM Distance: >3 FB Neck ROM: full    Dental no notable dental hx. (+) Edentulous Upper, Upper Dentures, Missing, Poor Dentition   Pulmonary shortness of breath and with exertion, sleep apnea and Oxygen sleep apnea , pneumonia, resolved, COPD,  COPD inhaler, neg recent URI, Current Smoker,    Pulmonary exam normal breath sounds clear to auscultation       Cardiovascular Exercise Tolerance: Good hypertension, On Medications (-) angina+ CAD and + Peripheral Vascular Disease  (-) Past MI, (-) Cardiac Stents and (-) CABG Normal cardiovascular exam+ dysrhythmias Atrial Fibrillation (-) Valvular Problems/Murmurs Rhythm:regular Rate:Normal     Neuro/Psych neg Seizures PSYCHIATRIC DISORDERS (Depression and alcoholism) CVA, No Residual Symptoms    GI/Hepatic hiatal hernia, GERD  Medicated,(+) Hepatitis -, C  Endo/Other  diabetes, Insulin Dependent  Renal/GU negative Renal ROS  negative genitourinary   Musculoskeletal   Abdominal   Peds  Hematology negative hematology ROS (+)   Anesthesia Other Findings Past Medical History:   Pneumonia                                                    GERD (gastroesophageal reflux disease)                       Depression                                                   Ascending aortic aneurysm (HCC)                              Ascending aortic aneurysm (HCC)                              CAD (coronary artery disease)                                Hepatitis C                                                  Cirrhosis (HCC)                                               Thrombocytopenia (HCC)                                       Alcohol abuse  History of smoking                                           Onychogryphosis                                              Insomnia                                                     Hypoxemia                                                    1St degree AV block                                          Broken rib                                      April 2016     Comment:History of broken ribs x 3   History of diverticulitis                                      Comment:ruptured and sepsis   Diabetes mellitus without complication (HCC)                 Alcohol use (HCC)                                            Chronic alcoholism ([place])                                     Movement disorder                                            COPD (chronic obstructive pulmonary disease) (*              Emphysema of lung (HCC)                                      Hyperlipidemia                                               Hypertension  Hip arthritis                                                Closed nondisplaced fracture of fifth metatars* Nov. 2016      Comment:left   Sleep apnea                                                    Comment:uses O2 '@2'$  L/min at night   Stroke [facility])                                                 History of hiatal hernia                                     HOH (hard of hearing)                                        Seasonal allergies                                           Esophageal rupture                                           Rib fracture                                                 Bronchitis                                                   Atrial fibrillation ([place])                       05/08/2016    Reproductive/Obstetrics negative OB ROS                              Anesthesia Physical Anesthesia Plan  ASA: IV  Anesthesia Plan: General   Post-op Pain Management:    Induction:   Airway Management Planned:   Additional Equipment:   Intra-op Plan:   Post-operative Plan:   Informed Consent: I have reviewed the patients History and Physical, chart, labs and discussed the procedure including the risks, benefits and alternatives for the proposed anesthesia with the patient or authorized representative who has indicated his/her understanding and acceptance.   Dental Advisory Given  Plan Discussed with: Anesthesiologist, CRNA and  Surgeon  Anesthesia Plan Comments:         Anesthesia Quick Evaluation

## 2016-05-22 NOTE — Transfer of Care (Signed)
Immediate Anesthesia Transfer of Care Note  Patient: Marvin Lewis  Procedure(s) Performed: Procedure(s): CARDIOVERSION (N/A)  Patient Location: PACU and Short Stay  Anesthesia Type:General  Level of Consciousness: awake, alert  and oriented  Airway & Oxygen Therapy: Patient Spontanous Breathing and Patient connected to nasal cannula oxygen  Post-op Assessment: Report given to RN and Post -op Vital signs reviewed and stable  Post vital signs: Reviewed and stable  Last Vitals:  Filed Vitals:   05/22/16 0836 05/22/16 0837  BP:  148/75  Pulse: 58   Temp:    Resp: 26 18    Complications: No apparent anesthesia complications

## 2016-05-22 NOTE — Anesthesia Procedure Notes (Signed)
Date/Time: 05/22/2016 8:24 AM Performed by: Doreen Salvage Pre-anesthesia Checklist: Patient identified, Emergency Drugs available, Suction available and Patient being monitored Patient Re-evaluated:Patient Re-evaluated prior to inductionOxygen Delivery Method: Nasal cannula Intubation Type: IV induction Dental Injury: Teeth and Oropharynx as per pre-operative assessment  Comments: Nasal cannula with etCO2 monitoring

## 2016-05-22 NOTE — Anesthesia Postprocedure Evaluation (Signed)
Anesthesia Post Note  Patient: Marvin Lewis  Procedure(s) Performed: Procedure(s) (LRB): CARDIOVERSION (N/A)  Patient location during evaluation: PACU Anesthesia Type: General Level of consciousness: awake and alert Pain management: pain level controlled Vital Signs Assessment: post-procedure vital signs reviewed and stable Respiratory status: spontaneous breathing, nonlabored ventilation, respiratory function stable and patient connected to nasal cannula oxygen Cardiovascular status: blood pressure returned to baseline and stable Postop Assessment: no signs of nausea or vomiting Anesthetic complications: no    Last Vitals:  Filed Vitals:   05/22/16 0915 05/22/16 0944  BP: 164/91 140/67  Pulse: 37 55  Temp:  36.3 C  Resp: 18     Last Pain:  Filed Vitals:   05/22/16 0944  PainSc: 5                  Martha Clan

## 2016-05-22 NOTE — Progress Notes (Signed)
Pt. Discharged to home via wc. Discharge instructions and medication regimen reviewed at bedside with patient and spouse. Both verbalize understanding of instructions and medication regimen. Prescriptions included with d/c papers. Patient assessment unchanged from this morning. TELE and IV discontinued per policy.

## 2016-05-22 NOTE — CV Procedure (Signed)
Procedure:  DCCV  Indication:  Symptomatic afib  Sedation 70 mg propfol per dept of anesthesia  After informed consent, time out protocol and adequate sedation pt received 120J and 200J biphasic dc cardioversion with conversion to nsr. No immediate complications

## 2016-05-22 NOTE — Final Progress Note (Signed)
Doing well post cardioversion. OK for discharge

## 2016-05-22 NOTE — Discharge Summary (Signed)
Physician Discharge Summary  Patient ID: MONTE ZINNI MRN: [id number] DOB/AGE: 65-Aug-1952 65 y.o.  Admit date: 05/21/2016 Discharge date: 05/22/2016  Primary Discharge Diagnosis afib Secondary Discharge Diagnosis hypertension  Significant Diagnostic Studies: yes  Consults: None  Hospital Course: Pt was admitted for a load with sotolol. He tolerated 4 doses well with no significant prolongation of his qtc. He was chronically anticoagulated with eliquis. This am he underwent biphasic dc cardioversion to nsr.    Discharge Exam: Blood pressure 140/67, pulse 55, temperature 97.4 F (36.3 C), temperature source Oral, resp. rate 18, height '5\' 11"'$  (1.803 m), weight 104.872 kg (231 lb 3.2 oz), SpO2 96 %.    General appearance: alert and cooperative Head: Normocephalic, without obvious abnormality, atraumatic Resp: clear to auscultation bilaterally Cardio: regular rate and rhythm Neurologic: Grossly normal Labs:   Lab Results  Component Value Date   WBC 8.2 05/21/2016   HGB 10.6* 05/21/2016   HCT 33.5* 05/21/2016   MCV 77.0* 05/21/2016   PLT 154 05/21/2016    Recent Labs Lab 05/22/16 0435  NA 136  K 4.3  CL 102  CO2 28  BUN 32*  CREATININE 1.12  CALCIUM 8.8*  GLUCOSE 238*       EKG: nsr   FOLLOW UP PLANS AND APPOINTMENTS    Medication List    STOP taking these medications        metoprolol tartrate 25 MG tablet  Commonly known as:  LOPRESSOR      TAKE these medications        albuterol (2.5 MG/3ML) 0.083% nebulizer solution  Commonly known as:  PROVENTIL  Take 2.5 mg by nebulization every 6 (six) hours as needed for wheezing or shortness of breath.     amLODipine 10 MG tablet  Commonly known as:  NORVASC  Take 1 tablet (10 mg total) by mouth daily.     atorvastatin 10 MG tablet  Commonly known as:  LIPITOR  Take 1 tablet (10 mg total) by mouth at bedtime.     B-D INS SYRINGE 0.5CC/30GX1/2" 30G X 1/2" 0.5 ML Misc  Generic drug:  Insulin  Syringe-Needle U-100  AS DIRECTED.     benazepril 20 MG tablet  Commonly known as:  LOTENSIN  Take 1 tablet (20 mg total) by mouth daily. Avoid salt substitutes and fruit smoothies     ELIQUIS 5 MG Tabs tablet  Generic drug:  apixaban  Take 5 mg by mouth 2 (two) times daily.     insulin aspart 100 UNIT/ML FlexPen  Commonly known as:  NOVOLOG  Sliding scale Sub-Q for FSBS 200-249 give 3 units, 250-299 give 5 units, 300-349 give 7 units, 350-399 give 9 units, 400+ give 12 units     insulin glargine 100 UNIT/ML injection  Commonly known as:  LANTUS  Inject 0.55 mLs (55 Units total) into the skin at bedtime. Or as directed     omeprazole 20 MG capsule  Commonly known as:  PRILOSEC  Take 1 capsule (20 mg total) by mouth daily. Caution:prolonged use may increase risk of pneumonia, colitis, osteoporosis, anemia     sotalol 80 MG tablet  Commonly known as:  BETAPACE  Take 1 tablet (80 mg total) by mouth 2 (two) times daily.           Follow-up Information    Follow up with Teodoro Spray., MD.   Specialty:  Cardiology   Why:  Tuesday, June 27th at 1130am, ccs   Contact information:   [address]  [place] [address] [phone]       Follow up In 1 week.   Why:  Follow up cardioversion      BRING ALL MEDICATIONS WITH YOU TO FOLLOW UP APPOINTMENTS  Time spent with patient to include physician time: 30 Signed:  Teodoro Spray MD, [facility] 05/22/2016, 10:51 AM

## 2016-05-28 ENCOUNTER — Other Ambulatory Visit: Payer: Self-pay | Admitting: Gastroenterology

## 2016-05-28 DIAGNOSIS — I69391 Dysphagia following cerebral infarction: Secondary | ICD-10-CM | POA: Diagnosis not present

## 2016-05-28 DIAGNOSIS — Z86010 Personal history of colonic polyps: Secondary | ICD-10-CM | POA: Diagnosis not present

## 2016-05-28 DIAGNOSIS — R945 Abnormal results of liver function studies: Secondary | ICD-10-CM

## 2016-05-28 DIAGNOSIS — B182 Chronic viral hepatitis C: Secondary | ICD-10-CM | POA: Diagnosis not present

## 2016-05-28 DIAGNOSIS — R7989 Other specified abnormal findings of blood chemistry: Secondary | ICD-10-CM

## 2016-05-29 DIAGNOSIS — Z794 Long term (current) use of insulin: Secondary | ICD-10-CM | POA: Diagnosis not present

## 2016-05-29 DIAGNOSIS — Z79899 Other long term (current) drug therapy: Secondary | ICD-10-CM | POA: Diagnosis not present

## 2016-05-29 DIAGNOSIS — E1165 Type 2 diabetes mellitus with hyperglycemia: Secondary | ICD-10-CM | POA: Diagnosis not present

## 2016-05-31 ENCOUNTER — Ambulatory Visit
Admission: RE | Admit: 2016-05-31 | Discharge: 2016-05-31 | Disposition: A | Discharge status: Home / Self Care | Payer: Medicare Other | Source: Ambulatory Visit | Attending: Gastroenterology | Admitting: Gastroenterology

## 2016-05-31 DIAGNOSIS — Z9049 Acquired absence of other specified parts of digestive tract: Secondary | ICD-10-CM | POA: Insufficient documentation

## 2016-05-31 DIAGNOSIS — R945 Abnormal results of liver function studies: Secondary | ICD-10-CM | POA: Diagnosis not present

## 2016-05-31 DIAGNOSIS — I714 Abdominal aortic aneurysm, without rupture: Secondary | ICD-10-CM | POA: Diagnosis not present

## 2016-05-31 DIAGNOSIS — B1920 Unspecified viral hepatitis C without hepatic coma: Secondary | ICD-10-CM | POA: Diagnosis not present

## 2016-05-31 DIAGNOSIS — B182 Chronic viral hepatitis C: Secondary | ICD-10-CM | POA: Diagnosis not present

## 2016-05-31 DIAGNOSIS — R7989 Other specified abnormal findings of blood chemistry: Secondary | ICD-10-CM

## 2016-06-03 ENCOUNTER — Other Ambulatory Visit: Payer: Self-pay | Admitting: Gastroenterology

## 2016-06-03 DIAGNOSIS — R1310 Dysphagia, unspecified: Secondary | ICD-10-CM

## 2016-06-04 DIAGNOSIS — I4891 Unspecified atrial fibrillation: Secondary | ICD-10-CM | POA: Diagnosis not present

## 2016-06-04 DIAGNOSIS — I481 Persistent atrial fibrillation: Secondary | ICD-10-CM | POA: Diagnosis not present

## 2016-06-04 DIAGNOSIS — E119 Type 2 diabetes mellitus without complications: Secondary | ICD-10-CM | POA: Diagnosis not present

## 2016-06-04 DIAGNOSIS — Z720 Tobacco use: Secondary | ICD-10-CM | POA: Diagnosis not present

## 2016-06-04 DIAGNOSIS — I10 Essential (primary) hypertension: Secondary | ICD-10-CM | POA: Diagnosis not present

## 2016-06-04 DIAGNOSIS — I712 Thoracic aortic aneurysm, without rupture: Secondary | ICD-10-CM | POA: Diagnosis not present

## 2016-07-11 DIAGNOSIS — E1165 Type 2 diabetes mellitus with hyperglycemia: Secondary | ICD-10-CM | POA: Diagnosis not present

## 2016-07-11 DIAGNOSIS — Z79899 Other long term (current) drug therapy: Secondary | ICD-10-CM | POA: Diagnosis not present

## 2016-07-11 DIAGNOSIS — Z794 Long term (current) use of insulin: Secondary | ICD-10-CM | POA: Diagnosis not present

## 2016-07-11 DIAGNOSIS — I481 Persistent atrial fibrillation: Secondary | ICD-10-CM | POA: Diagnosis not present

## 2016-07-11 DIAGNOSIS — M6530 Trigger finger, unspecified finger: Secondary | ICD-10-CM | POA: Diagnosis not present

## 2016-07-16 ENCOUNTER — Other Ambulatory Visit: Payer: Self-pay | Admitting: Family Medicine

## 2016-07-16 ENCOUNTER — Other Ambulatory Visit: Payer: Self-pay

## 2016-07-16 ENCOUNTER — Telehealth: Payer: Self-pay | Admitting: Family Medicine

## 2016-07-16 NOTE — Telephone Encounter (Signed)
Please ask patient to get this from his GI specialist from here on; chronic use of this can cause problems, and I would be more comfortable if GI was running this; thank you

## 2016-07-16 NOTE — Telephone Encounter (Signed)
See other note I am going to ask the patient to get this from his gastroenterologist from now on Thank you

## 2016-07-17 NOTE — Telephone Encounter (Signed)
Left voice mail

## 2016-08-07 DIAGNOSIS — I481 Persistent atrial fibrillation: Secondary | ICD-10-CM | POA: Diagnosis not present

## 2016-08-14 ENCOUNTER — Other Ambulatory Visit: Payer: Self-pay | Admitting: Family Medicine

## 2016-08-14 ENCOUNTER — Ambulatory Visit: Payer: Medicare Other | Admitting: Family Medicine

## 2016-08-14 NOTE — Telephone Encounter (Signed)
Last Cr and K+ reviewed; Rx approved 

## 2016-08-27 ENCOUNTER — Encounter
Admission: RE | Disposition: A | Discharge status: Home / Self Care | Payer: Self-pay | Source: Ambulatory Visit | Attending: Gastroenterology

## 2016-08-27 ENCOUNTER — Ambulatory Visit: Payer: Medicare Other | Admitting: *Deleted

## 2016-08-27 ENCOUNTER — Ambulatory Visit
Admission: RE | Admit: 2016-08-27 | Discharge: 2016-08-27 | Disposition: A | Discharge status: Home / Self Care | Payer: Medicare Other | Source: Ambulatory Visit | Attending: Gastroenterology | Admitting: Gastroenterology

## 2016-08-27 DIAGNOSIS — I110 Hypertensive heart disease with heart failure: Secondary | ICD-10-CM | POA: Diagnosis not present

## 2016-08-27 DIAGNOSIS — I739 Peripheral vascular disease, unspecified: Secondary | ICD-10-CM | POA: Insufficient documentation

## 2016-08-27 DIAGNOSIS — Z7901 Long term (current) use of anticoagulants: Secondary | ICD-10-CM | POA: Insufficient documentation

## 2016-08-27 DIAGNOSIS — I4891 Unspecified atrial fibrillation: Secondary | ICD-10-CM | POA: Diagnosis not present

## 2016-08-27 DIAGNOSIS — Z794 Long term (current) use of insulin: Secondary | ICD-10-CM | POA: Insufficient documentation

## 2016-08-27 DIAGNOSIS — K219 Gastro-esophageal reflux disease without esophagitis: Secondary | ICD-10-CM | POA: Diagnosis not present

## 2016-08-27 DIAGNOSIS — D123 Benign neoplasm of transverse colon: Secondary | ICD-10-CM | POA: Insufficient documentation

## 2016-08-27 DIAGNOSIS — Z980 Intestinal bypass and anastomosis status: Secondary | ICD-10-CM | POA: Diagnosis not present

## 2016-08-27 DIAGNOSIS — I509 Heart failure, unspecified: Secondary | ICD-10-CM | POA: Insufficient documentation

## 2016-08-27 DIAGNOSIS — E119 Type 2 diabetes mellitus without complications: Secondary | ICD-10-CM | POA: Diagnosis not present

## 2016-08-27 DIAGNOSIS — I712 Thoracic aortic aneurysm, without rupture: Secondary | ICD-10-CM | POA: Insufficient documentation

## 2016-08-27 DIAGNOSIS — Z1211 Encounter for screening for malignant neoplasm of colon: Secondary | ICD-10-CM | POA: Diagnosis not present

## 2016-08-27 DIAGNOSIS — G4730 Sleep apnea, unspecified: Secondary | ICD-10-CM | POA: Diagnosis not present

## 2016-08-27 DIAGNOSIS — D120 Benign neoplasm of cecum: Secondary | ICD-10-CM | POA: Insufficient documentation

## 2016-08-27 DIAGNOSIS — E785 Hyperlipidemia, unspecified: Secondary | ICD-10-CM | POA: Diagnosis not present

## 2016-08-27 DIAGNOSIS — Z9981 Dependence on supplemental oxygen: Secondary | ICD-10-CM | POA: Diagnosis not present

## 2016-08-27 DIAGNOSIS — K7460 Unspecified cirrhosis of liver: Secondary | ICD-10-CM | POA: Insufficient documentation

## 2016-08-27 DIAGNOSIS — D649 Anemia, unspecified: Secondary | ICD-10-CM | POA: Insufficient documentation

## 2016-08-27 DIAGNOSIS — F329 Major depressive disorder, single episode, unspecified: Secondary | ICD-10-CM | POA: Insufficient documentation

## 2016-08-27 DIAGNOSIS — M13859 Other specified arthritis, unspecified hip: Secondary | ICD-10-CM | POA: Diagnosis not present

## 2016-08-27 DIAGNOSIS — D125 Benign neoplasm of sigmoid colon: Secondary | ICD-10-CM | POA: Insufficient documentation

## 2016-08-27 DIAGNOSIS — B1920 Unspecified viral hepatitis C without hepatic coma: Secondary | ICD-10-CM | POA: Insufficient documentation

## 2016-08-27 DIAGNOSIS — K635 Polyp of colon: Secondary | ICD-10-CM | POA: Diagnosis not present

## 2016-08-27 DIAGNOSIS — K5730 Diverticulosis of large intestine without perforation or abscess without bleeding: Secondary | ICD-10-CM | POA: Insufficient documentation

## 2016-08-27 DIAGNOSIS — Z86010 Personal history of colonic polyps: Secondary | ICD-10-CM | POA: Diagnosis not present

## 2016-08-27 DIAGNOSIS — J449 Chronic obstructive pulmonary disease, unspecified: Secondary | ICD-10-CM | POA: Diagnosis not present

## 2016-08-27 DIAGNOSIS — Z8673 Personal history of transient ischemic attack (TIA), and cerebral infarction without residual deficits: Secondary | ICD-10-CM | POA: Insufficient documentation

## 2016-08-27 DIAGNOSIS — K449 Diaphragmatic hernia without obstruction or gangrene: Secondary | ICD-10-CM | POA: Insufficient documentation

## 2016-08-27 DIAGNOSIS — Z8719 Personal history of other diseases of the digestive system: Secondary | ICD-10-CM | POA: Insufficient documentation

## 2016-08-27 DIAGNOSIS — I2510 Atherosclerotic heart disease of native coronary artery without angina pectoris: Secondary | ICD-10-CM | POA: Insufficient documentation

## 2016-08-27 HISTORY — DX: Thoracic aortic aneurysm, without rupture: I71.2

## 2016-08-27 HISTORY — DX: Heart failure, unspecified: I50.9

## 2016-08-27 HISTORY — DX: Unspecified atrial fibrillation: I48.91

## 2016-08-27 HISTORY — PX: COLONOSCOPY WITH PROPOFOL: SHX5780

## 2016-08-27 HISTORY — DX: Thoracic aortic aneurysm, without rupture, unspecified: I71.20

## 2016-08-27 HISTORY — DX: Reserved for inherently not codable concepts without codable children: IMO0001

## 2016-08-27 LAB — CBC WITH DIFFERENTIAL/PLATELET
BASOS PCT: 0 %
Basophils Absolute: 0 10*3/uL (ref 0–0.1)
EOS ABS: 0.4 10*3/uL (ref 0–0.7)
EOS PCT: 6 %
HEMATOCRIT: 34.7 % — AB (ref 40.0–52.0)
Hemoglobin: 11.1 g/dL — ABNORMAL LOW (ref 13.0–18.0)
Lymphocytes Relative: 14 %
Lymphs Abs: 1 10*3/uL (ref 1.0–3.6)
MCH: 24.2 pg — ABNORMAL LOW (ref 26.0–34.0)
MCHC: 32.1 g/dL (ref 32.0–36.0)
MCV: 75.4 fL — ABNORMAL LOW (ref 80.0–100.0)
MONO ABS: 0.7 10*3/uL (ref 0.2–1.0)
MONOS PCT: 9 %
Neutro Abs: 5.2 10*3/uL (ref 1.4–6.5)
Neutrophils Relative %: 71 %
PLATELETS: 112 10*3/uL — AB (ref 150–440)
RBC: 4.6 MIL/uL (ref 4.40–5.90)
RDW: 17.6 % — AB (ref 11.5–14.5)
WBC: 7.2 10*3/uL (ref 3.8–10.6)

## 2016-08-27 LAB — GLUCOSE, CAPILLARY: Glucose-Capillary: 131 mg/dL — ABNORMAL HIGH (ref 65–99)

## 2016-08-27 LAB — PROTIME-INR
INR: 1.11
PROTHROMBIN TIME: 14.4 s (ref 11.4–15.2)

## 2016-08-27 SURGERY — COLONOSCOPY WITH PROPOFOL
Anesthesia: General

## 2016-08-27 MED ORDER — PROPOFOL 500 MG/50ML IV EMUL
INTRAVENOUS | Status: DC | PRN
  Administered 2016-08-27 (×3): 10 ug via INTRAVENOUS
  Administered 2016-08-27: 20 ug via INTRAVENOUS

## 2016-08-27 MED ORDER — SODIUM CHLORIDE 0.9 % IV SOLN: INTRAVENOUS | Status: DC

## 2016-08-27 MED ORDER — SODIUM CHLORIDE 0.9 % IV SOLN
INTRAVENOUS | Status: DC
  Administered 2016-08-27: 11:00:00 via INTRAVENOUS

## 2016-08-27 NOTE — Op Note (Signed)
[facility] Gastroenterology Patient Name: Marvin Lewis Procedure Date: 08/27/2016 11:49 AM MRN: [id number] Account #: [id number] Date of Birth: 1951-05-06 Admit Type: Outpatient Age: 65 Room: [facility] ENDO ROOM 3 Gender: Male Note Status: Finalized Procedure:            Colonoscopy Indications:          Personal history of colonic polyps Providers:            Lollie Sails, MD Referring MD:         Arnetha Courser (Referring MD) Medicines:            Monitored Anesthesia Care Complications:        No immediate complications. Procedure:            Pre-Anesthesia Assessment:                       - ASA Grade Assessment: III - A patient with severe                        systemic disease.                       After obtaining informed consent, the colonoscope was                        passed under direct vision. Throughout the procedure,                        the patient's blood pressure, pulse, and oxygen                        saturations were monitored continuously. The                        Colonoscope was introduced through the anus and                        advanced to the the cecum, identified by appendiceal                        orifice and ileocecal valve. The colonoscopy was                        performed without difficulty. The patient tolerated the                        procedure well. The quality of the bowel preparation                        was fair. Findings:      Multiple small to medium diverticula were found in the sigmoid colon,       descending colon, transverse colon and ascending colon.      There was evidence of a prior end-to-side colo-colonic anastomosis in       the proximal rectum. This was patent and was characterized by healthy       appearing mucosa. The anastomosis was traversed.      A 3 mm polyp was found in the proximal sigmoid colon. The polyp was       sessile. The polyp was removed with a cold biopsy forceps.  Resection and  retrieval were complete.      A 6 mm polyp was found in the distal transverse colon. The polyp was       semi-pedunculated. The polyp was removed with a cold snare. Resection       and retrieval were complete.      A 2 mm polyp was found in the cecum. The polyp was sessile. The polyp       was removed with a cold biopsy forceps. Resection and retrieval were       complete.      The exam was otherwise without abnormality. Impression:           - Preparation of the colon was fair.                       - Diverticulosis in the sigmoid colon, in the                        descending colon, in the transverse colon and in the                        ascending colon.                       - Patent end-to-side colo-colonic anastomosis,                        characterized by healthy appearing mucosa.                       - One 3 mm polyp in the proximal sigmoid colon, removed                        with a cold biopsy forceps. Resected and retrieved.                       - One 6 mm polyp in the distal transverse colon,                        removed with a cold snare. Resected and retrieved.                       - One 2 mm polyp in the cecum, removed with a cold                        biopsy forceps. Resected and retrieved.                       - The examination was otherwise normal. Recommendation:       - Discharge patient to home.                       - Telephone GI clinic for pathology results in 1 week. Procedure Code(s):    --- Professional ---                       [phone], Colonoscopy, flexible; with removal of tumor(s),                        polyp(s), or other lesion(s) by snare technique  36122, 107, Colonoscopy, flexible; with biopsy, single                        or multiple Diagnosis Code(s):    --- Professional ---                       Z98.0, Intestinal bypass and anastomosis status                       D12.5, Benign neoplasm of sigmoid  colon                       D12.3, Benign neoplasm of transverse colon (hepatic                        flexure or splenic flexure)                       D12.0, Benign neoplasm of cecum                       Z86.010, Personal history of colonic polyps                       K57.30, Diverticulosis of large intestine without                        perforation or abscess without bleeding CPT copyright 2016 American Medical Association. All rights reserved. The codes documented in this report are preliminary and upon coder review may  be revised to meet current compliance requirements. Lollie Sails, MD 08/27/2016 12:30:15 PM This report has been signed electronically. Number of Addenda: 0 Note Initiated On: 08/27/2016 11:49 AM Scope Withdrawal Time: 0 hours 7 minutes 36 seconds  Total Procedure Duration: 0 hours 21 minutes 27 seconds       [facility]

## 2016-08-27 NOTE — Transfer of Care (Signed)
Immediate Anesthesia Transfer of Care Note  Patient: Marvin Lewis  Procedure(s) Performed: Procedure(s): COLONOSCOPY WITH PROPOFOL (N/A)  Patient Location: PACU  Anesthesia Type:General  Level of Consciousness: awake, alert  and oriented  Airway & Oxygen Therapy: Patient Spontanous Breathing and Patient connected to nasal cannula oxygen  Post-op Assessment: Report given to RN and Post -op Vital signs reviewed and stable  Post vital signs: Reviewed and stable  Last Vitals:  Vitals:   08/27/16 1018 08/27/16 1228  BP: (!) 150/81   Pulse: 70   Resp: 17   Temp: 36.8 C (P) 36.3 C    Last Pain:  Vitals:   08/27/16 1228  TempSrc: (P) Tympanic         Complications: No apparent anesthesia complications

## 2016-08-27 NOTE — H&P (Addendum)
Outpatient short stay form Pre-procedure 08/27/2016 11:29 AM Lollie Sails MD  Primary Physician: Dr. Vladimir Creeks  Reason for visit:  Colonoscopy  History of present illness:  Patient is a 65 year old male presenting today for colonoscopy due to personal history of adenomatous colon polyps. He tolerated his prep well. He does take Coumadin and we are waiting ProTime and platelet count. Patient was previously on eliquis.    Current Facility-Administered Medications:  .  0.9 %  sodium chloride infusion, , Intravenous, Continuous, Lollie Sails, MD, Last Rate: 20 mL/hr at 08/27/16 1039 .  0.9 %  sodium chloride infusion, , Intravenous, Continuous, Lollie Sails, MD  Prescriptions Prior to Admission  Medication Sig Dispense Refill Last Dose  . amLODipine (NORVASC) 10 MG tablet Take 1 tablet (10 mg total) by mouth daily. (Patient taking differently: Take 5 mg by mouth daily. ) 30 tablet 5 08/26/2016 at 1800  . atorvastatin (LIPITOR) 10 MG tablet Take 1 tablet (10 mg total) by mouth at bedtime. 30 tablet 2 08/26/2016 at 1800  . benazepril (LOTENSIN) 20 MG tablet Take 1 tablet (20 mg total) by mouth daily. Avoid salt substitutes and fruit smoothies; 30 tablet 3 08/26/2016 at 0900  . furosemide (LASIX) 20 MG tablet Take 20 mg by mouth.   Past Week at Unknown time  . insulin glargine (LANTUS) 100 UNIT/ML injection Inject 0.55 mLs (55 Units total) into the skin at bedtime. Or as directed 5 vial 1 08/26/2016 at 2200  . omeprazole (PRILOSEC) 20 MG capsule Take 1 capsule (20 mg total) by mouth daily. Caution:prolonged use may increase risk of pneumonia, colitis, osteoporosis, anemia 30 capsule 1 08/26/2016 at 0900  . sotalol (BETAPACE) 80 MG tablet Take 1 tablet (80 mg total) by mouth 2 (two) times daily. 60 tablet 11 08/26/2016 at 1800  . albuterol (PROVENTIL) (2.5 MG/3ML) 0.083% nebulizer solution Take 2.5 mg by nebulization every 6 (six) hours as needed for wheezing or shortness of breath.    05/21/2016 at 0800  . apixaban (ELIQUIS) 5 MG TABS tablet Take 5 mg by mouth 2 (two) times daily.   Not Taking at Unknown time  . B-D INS SYRINGE 0.5CC/30GX1/2" 30G X 1/2" 0.5 ML MISC AS DIRECTED. 100 each 12 Taking  . insulin aspart (NOVOLOG) 100 UNIT/ML FlexPen Sliding scale Sub-Q for FSBS 200-249 give 3 units, 250-299 give 5 units, 300-349 give 7 units, 350-399 give 9 units, 400+ give 12 units 15 mL 1      No Known Allergies   Past Medical History:  Diagnosis Date  . A-fib ([place])   . Alcohol abuse   . Alcohol use ([place])   . Aortic aneurysm, intrathoracic ([place])   . Ascending aortic aneurysm ([place])   . Ascending aortic aneurysm ([place])   . Atrial fibrillation ([place]) 05/08/2016  . Broken rib April 2016   History of broken ribs x 3  . Bronchitis   . CAD (coronary artery disease)   . CHF (congestive heart failure) ([place])   . Chronic alcoholism ([place])   . Cirrhosis ([place])   . Closed nondisplaced fracture of fifth metatarsal bone of left foot Nov. 2016   left  . COPD (chronic obstructive pulmonary disease) ([place])   . Depression   . Diabetes mellitus without complication ([place])   . Emphysema of lung ([place])   . Esophageal rupture   . GERD (gastroesophageal reflux disease)   . Hepatitis C   . Hip arthritis   . History of diverticulitis  ruptured and sepsis  . History of hiatal hernia   . History of smoking   . HOH (hard of hearing)   . Hyperlipidemia   . Hypertension   . Hypoxemia   . Insomnia   . Movement disorder   . Onychogryphosis   . Pneumonia   . Rib fracture   . Seasonal allergies   . Shortness of breath dyspnea   . Sleep apnea    uses O2 '@2'$  L/min at night  . Stroke ([place])   . Thrombocytopenia ([place])     Review of systems:      Physical Exam    Heart and lungs: Regular rate and rhythm without rub or gallop, course airway noise bilaterally minimal wheezing bilaterally no rhonchi.    HEENT: Normocephalic atraumatic eyes are anicteric    Other:     Pertinant exam  for procedure: Soft nontender nondistended bowel sounds positive normoactive.    Planned proceedures: Colonoscopy and indicated procedures. It is of note patient has multiple medical issues including relatively recent cardioversion procedure for atrial fibrillation, cardiomyopathy, COPD, history of anemia, vocal cord paralysis, previous hospitalization requiring intubation and tracheostomy. He has also had a PEG in the past. Both of those are now discontinued. I have discussed the risks benefits and complications of procedures to include not limited to bleeding, infection, perforation and the risk of sedation and the patient wishes to proceed. I have also discussed that should we see any problems in the procedure itself we will be discontinuing. Case discussed with anesthesia as well as patient and wife.    Lollie Sails, MD Gastroenterology 08/27/2016  11:29 AM  Addendum platelet count 112 hemoglobin and hematocrit 11.1 over 34.7 white cells 7.2 INR 1.11

## 2016-08-27 NOTE — Anesthesia Postprocedure Evaluation (Signed)
Anesthesia Post Note  Patient: Marvin Lewis  Procedure(s) Performed: Procedure(s) (LRB): COLONOSCOPY WITH PROPOFOL (N/A)  Patient location during evaluation: Endoscopy Anesthesia Type: General Level of consciousness: awake and alert Pain management: pain level controlled Vital Signs Assessment: post-procedure vital signs reviewed and stable Respiratory status: spontaneous breathing and respiratory function stable Cardiovascular status: stable Anesthetic complications: no    Last Vitals:  Vitals:   08/27/16 1228 08/27/16 1238  BP: (!) 158/90 (!) 157/95  Pulse: 63 65  Resp: 20 (!) 22  Temp: 36.3 C     Last Pain:  Vitals:   08/27/16 1228  TempSrc: Tympanic                 [name] K

## 2016-08-27 NOTE — Anesthesia Preprocedure Evaluation (Signed)
Anesthesia Evaluation  Patient identified by MRN, date of birth, ID band Patient awake    Reviewed: Allergy & Precautions, NPO status , Patient's Chart, lab work & pertinent test results  Airway Mallampati: II      Comment: PT s/p tracheostomy. Now healing. Dental   Pulmonary shortness of breath, sleep apnea , COPD,  COPD inhaler and oxygen dependent, Current Smoker,           Cardiovascular hypertension, + CAD, + Peripheral Vascular Disease and +CHF  + dysrhythmias Atrial Fibrillation      Neuro/Psych Depression CVA    GI/Hepatic hiatal hernia, GERD  Medicated and Controlled,(+) Hepatitis -, Unspecified  Endo/Other  diabetes, Type 2, Oral Hypoglycemic Agents  Renal/GU      Musculoskeletal   Abdominal   Peds  Hematology  (+) anemia ,   Anesthesia Other Findings   Reproductive/Obstetrics                             Anesthesia Physical Anesthesia Plan  ASA: III  Anesthesia Plan: General   Post-op Pain Management:    Induction: Intravenous  Airway Management Planned: Nasal Cannula  Additional Equipment:   Intra-op Plan:   Post-operative Plan:   Informed Consent: I have reviewed the patients History and Physical, chart, labs and discussed the procedure including the risks, benefits and alternatives for the proposed anesthesia with the patient or authorized representative who has indicated his/her understanding and acceptance.     Plan Discussed with:   Anesthesia Plan Comments:         Anesthesia Quick Evaluation

## 2016-08-28 ENCOUNTER — Encounter: Payer: Self-pay | Admitting: Gastroenterology

## 2016-08-28 LAB — SURGICAL PATHOLOGY

## 2016-08-30 DIAGNOSIS — M79675 Pain in left toe(s): Secondary | ICD-10-CM | POA: Diagnosis not present

## 2016-08-30 DIAGNOSIS — M79674 Pain in right toe(s): Secondary | ICD-10-CM | POA: Diagnosis not present

## 2016-08-30 DIAGNOSIS — B351 Tinea unguium: Secondary | ICD-10-CM | POA: Diagnosis not present

## 2016-09-02 DIAGNOSIS — E119 Type 2 diabetes mellitus without complications: Secondary | ICD-10-CM | POA: Diagnosis not present

## 2016-09-02 DIAGNOSIS — I712 Thoracic aortic aneurysm, without rupture: Secondary | ICD-10-CM | POA: Diagnosis not present

## 2016-09-02 DIAGNOSIS — I10 Essential (primary) hypertension: Secondary | ICD-10-CM | POA: Diagnosis not present

## 2016-09-02 DIAGNOSIS — I4891 Unspecified atrial fibrillation: Secondary | ICD-10-CM | POA: Diagnosis not present

## 2016-09-02 DIAGNOSIS — I481 Persistent atrial fibrillation: Secondary | ICD-10-CM | POA: Diagnosis not present

## 2016-09-04 DIAGNOSIS — M65321 Trigger finger, right index finger: Secondary | ICD-10-CM | POA: Diagnosis not present

## 2016-09-04 DIAGNOSIS — M65322 Trigger finger, left index finger: Secondary | ICD-10-CM | POA: Diagnosis not present

## 2016-09-12 DIAGNOSIS — Z720 Tobacco use: Secondary | ICD-10-CM | POA: Diagnosis not present

## 2016-09-12 DIAGNOSIS — R0609 Other forms of dyspnea: Secondary | ICD-10-CM | POA: Diagnosis not present

## 2016-09-12 DIAGNOSIS — R918 Other nonspecific abnormal finding of lung field: Secondary | ICD-10-CM | POA: Diagnosis not present

## 2016-09-12 DIAGNOSIS — I712 Thoracic aortic aneurysm, without rupture: Secondary | ICD-10-CM | POA: Diagnosis not present

## 2016-09-12 DIAGNOSIS — J431 Panlobular emphysema: Secondary | ICD-10-CM | POA: Diagnosis not present

## 2016-09-13 ENCOUNTER — Other Ambulatory Visit: Payer: Self-pay | Admitting: Specialist

## 2016-09-13 DIAGNOSIS — I712 Thoracic aortic aneurysm, without rupture, unspecified: Secondary | ICD-10-CM

## 2016-09-13 DIAGNOSIS — R918 Other nonspecific abnormal finding of lung field: Secondary | ICD-10-CM

## 2016-09-17 DIAGNOSIS — I481 Persistent atrial fibrillation: Secondary | ICD-10-CM | POA: Diagnosis not present

## 2016-09-20 ENCOUNTER — Ambulatory Visit: Payer: Medicare Other

## 2016-10-11 DIAGNOSIS — Z794 Long term (current) use of insulin: Secondary | ICD-10-CM | POA: Diagnosis not present

## 2016-10-11 DIAGNOSIS — E1165 Type 2 diabetes mellitus with hyperglycemia: Secondary | ICD-10-CM | POA: Diagnosis not present

## 2016-10-15 ENCOUNTER — Ambulatory Visit
Admission: RE | Admit: 2016-10-15 | Discharge: 2016-10-15 | Disposition: A | Discharge status: Home / Self Care | Payer: Medicare Other | Source: Ambulatory Visit | Attending: Specialist | Admitting: Specialist

## 2016-10-15 DIAGNOSIS — I2510 Atherosclerotic heart disease of native coronary artery without angina pectoris: Secondary | ICD-10-CM | POA: Diagnosis not present

## 2016-10-15 DIAGNOSIS — J439 Emphysema, unspecified: Secondary | ICD-10-CM | POA: Insufficient documentation

## 2016-10-15 DIAGNOSIS — J988 Other specified respiratory disorders: Secondary | ICD-10-CM | POA: Diagnosis not present

## 2016-10-15 DIAGNOSIS — I700 Atherosclerosis of aorta: Secondary | ICD-10-CM | POA: Diagnosis not present

## 2016-10-15 DIAGNOSIS — I712 Thoracic aortic aneurysm, without rupture, unspecified: Secondary | ICD-10-CM

## 2016-10-15 DIAGNOSIS — R918 Other nonspecific abnormal finding of lung field: Secondary | ICD-10-CM | POA: Insufficient documentation

## 2016-10-15 DIAGNOSIS — I481 Persistent atrial fibrillation: Secondary | ICD-10-CM | POA: Diagnosis not present

## 2016-10-15 DIAGNOSIS — J9819 Other pulmonary collapse: Secondary | ICD-10-CM | POA: Diagnosis not present

## 2016-10-17 DIAGNOSIS — E1165 Type 2 diabetes mellitus with hyperglycemia: Secondary | ICD-10-CM | POA: Diagnosis not present

## 2016-10-17 DIAGNOSIS — Z794 Long term (current) use of insulin: Secondary | ICD-10-CM | POA: Diagnosis not present

## 2016-10-17 DIAGNOSIS — E875 Hyperkalemia: Secondary | ICD-10-CM | POA: Diagnosis not present

## 2016-10-23 DIAGNOSIS — E875 Hyperkalemia: Secondary | ICD-10-CM | POA: Diagnosis not present

## 2016-11-09 ENCOUNTER — Other Ambulatory Visit: Payer: Self-pay | Admitting: Family Medicine

## 2016-11-09 MED ORDER — ATORVASTATIN CALCIUM 10 MG PO TABS: 10.0000 mg | ORAL_TABLET | Freq: Every day | ORAL | 0 refills | Status: AC

## 2016-11-09 NOTE — Telephone Encounter (Signed)
Patient has not had any liver tests through our office for at least six months Please contact his GI doctor and see if they say it is okay to continue his Lipitor at 10 mg every night I just need to know his last SGPT (ALT) If he's not had any liver function tests, ask GI if they will checking those soon or if I should, but I need to know about continuing the statin I'll send one week to allow for [place] reaching them, him getting blood if needed Thank you

## 2016-11-12 ENCOUNTER — Other Ambulatory Visit: Payer: Self-pay | Admitting: Family Medicine

## 2016-11-12 ENCOUNTER — Other Ambulatory Visit: Payer: Self-pay

## 2016-11-12 DIAGNOSIS — E785 Hyperlipidemia, unspecified: Secondary | ICD-10-CM

## 2016-11-12 DIAGNOSIS — K7030 Alcoholic cirrhosis of liver without ascites: Secondary | ICD-10-CM

## 2016-11-12 DIAGNOSIS — Z5181 Encounter for therapeutic drug level monitoring: Secondary | ICD-10-CM

## 2016-11-12 NOTE — Progress Notes (Signed)
I ordered a full hepatic function panel; I'm not sure why he's not following up with GI; will forward them note and results

## 2016-11-12 NOTE — Assessment & Plan Note (Signed)
Check sgpt on statin 

## 2016-11-12 NOTE — Assessment & Plan Note (Signed)
Check labs, forward to GI

## 2016-11-12 NOTE — Telephone Encounter (Signed)
Patient is going to come here to get blood work, it has not been checked.

## 2016-11-13 DIAGNOSIS — I481 Persistent atrial fibrillation: Secondary | ICD-10-CM | POA: Diagnosis not present

## 2016-11-21 DIAGNOSIS — I481 Persistent atrial fibrillation: Secondary | ICD-10-CM | POA: Diagnosis not present

## 2016-11-29 DIAGNOSIS — Z23 Encounter for immunization: Secondary | ICD-10-CM | POA: Diagnosis not present

## 2016-12-18 DIAGNOSIS — I481 Persistent atrial fibrillation: Secondary | ICD-10-CM | POA: Diagnosis not present

## 2016-12-18 DIAGNOSIS — J431 Panlobular emphysema: Secondary | ICD-10-CM | POA: Diagnosis not present

## 2016-12-18 DIAGNOSIS — J449 Chronic obstructive pulmonary disease, unspecified: Secondary | ICD-10-CM | POA: Diagnosis not present

## 2016-12-18 DIAGNOSIS — Z720 Tobacco use: Secondary | ICD-10-CM | POA: Diagnosis not present

## 2016-12-18 DIAGNOSIS — R05 Cough: Secondary | ICD-10-CM | POA: Diagnosis not present

## 2016-12-18 DIAGNOSIS — J310 Chronic rhinitis: Secondary | ICD-10-CM | POA: Diagnosis not present

## 2017-01-02 ENCOUNTER — Other Ambulatory Visit: Payer: Self-pay | Admitting: Family Medicine

## 2017-01-08 DIAGNOSIS — I481 Persistent atrial fibrillation: Secondary | ICD-10-CM | POA: Diagnosis not present

## 2017-01-20 DIAGNOSIS — Z79899 Other long term (current) drug therapy: Secondary | ICD-10-CM | POA: Diagnosis not present

## 2017-01-20 DIAGNOSIS — Z794 Long term (current) use of insulin: Secondary | ICD-10-CM | POA: Diagnosis not present

## 2017-01-20 DIAGNOSIS — E119 Type 2 diabetes mellitus without complications: Secondary | ICD-10-CM | POA: Diagnosis not present

## 2017-02-06 DIAGNOSIS — I481 Persistent atrial fibrillation: Secondary | ICD-10-CM | POA: Diagnosis not present

## 2017-02-19 ENCOUNTER — Encounter: Payer: Medicare Other | Admitting: Family Medicine

## 2017-02-27 ENCOUNTER — Encounter: Payer: Medicare Other | Admitting: Family Medicine

## 2017-03-04 DIAGNOSIS — I4891 Unspecified atrial fibrillation: Secondary | ICD-10-CM | POA: Diagnosis not present

## 2017-03-04 DIAGNOSIS — I481 Persistent atrial fibrillation: Secondary | ICD-10-CM | POA: Diagnosis not present

## 2017-03-04 DIAGNOSIS — I10 Essential (primary) hypertension: Secondary | ICD-10-CM | POA: Diagnosis not present

## 2017-03-04 DIAGNOSIS — E119 Type 2 diabetes mellitus without complications: Secondary | ICD-10-CM | POA: Diagnosis not present

## 2017-03-12 DIAGNOSIS — Z7901 Long term (current) use of anticoagulants: Secondary | ICD-10-CM | POA: Diagnosis not present

## 2017-03-13 ENCOUNTER — Encounter: Payer: Medicare Other | Admitting: Family Medicine

## 2017-03-14 ENCOUNTER — Other Ambulatory Visit: Payer: Self-pay | Admitting: Family Medicine

## 2017-03-20 DIAGNOSIS — Z7901 Long term (current) use of anticoagulants: Secondary | ICD-10-CM | POA: Diagnosis not present

## 2017-03-26 ENCOUNTER — Other Ambulatory Visit: Payer: Self-pay | Admitting: Family Medicine

## 2017-03-31 DIAGNOSIS — I481 Persistent atrial fibrillation: Secondary | ICD-10-CM | POA: Diagnosis not present

## 2017-04-14 DIAGNOSIS — I481 Persistent atrial fibrillation: Secondary | ICD-10-CM | POA: Diagnosis not present

## 2017-04-14 DIAGNOSIS — Z794 Long term (current) use of insulin: Secondary | ICD-10-CM | POA: Diagnosis not present

## 2017-04-14 DIAGNOSIS — E119 Type 2 diabetes mellitus without complications: Secondary | ICD-10-CM | POA: Diagnosis not present

## 2017-04-16 ENCOUNTER — Other Ambulatory Visit: Payer: Self-pay | Admitting: Specialist

## 2017-04-16 DIAGNOSIS — I712 Thoracic aortic aneurysm, without rupture, unspecified: Secondary | ICD-10-CM

## 2017-04-16 DIAGNOSIS — J431 Panlobular emphysema: Secondary | ICD-10-CM | POA: Diagnosis not present

## 2017-04-16 DIAGNOSIS — Z720 Tobacco use: Secondary | ICD-10-CM | POA: Diagnosis not present

## 2017-04-16 DIAGNOSIS — R0602 Shortness of breath: Secondary | ICD-10-CM | POA: Diagnosis not present

## 2017-04-16 DIAGNOSIS — E119 Type 2 diabetes mellitus without complications: Secondary | ICD-10-CM | POA: Diagnosis not present

## 2017-04-16 DIAGNOSIS — I481 Persistent atrial fibrillation: Secondary | ICD-10-CM | POA: Diagnosis not present

## 2017-04-16 DIAGNOSIS — Z794 Long term (current) use of insulin: Secondary | ICD-10-CM | POA: Diagnosis not present

## 2017-04-16 DIAGNOSIS — I10 Essential (primary) hypertension: Secondary | ICD-10-CM | POA: Diagnosis not present

## 2017-04-16 DIAGNOSIS — R0609 Other forms of dyspnea: Secondary | ICD-10-CM | POA: Diagnosis not present

## 2017-04-21 DIAGNOSIS — Z79899 Other long term (current) drug therapy: Secondary | ICD-10-CM | POA: Diagnosis not present

## 2017-04-21 DIAGNOSIS — I10 Essential (primary) hypertension: Secondary | ICD-10-CM | POA: Diagnosis not present

## 2017-04-21 DIAGNOSIS — E1165 Type 2 diabetes mellitus with hyperglycemia: Secondary | ICD-10-CM | POA: Diagnosis not present

## 2017-04-21 DIAGNOSIS — Z794 Long term (current) use of insulin: Secondary | ICD-10-CM | POA: Diagnosis not present

## 2017-05-12 DIAGNOSIS — I481 Persistent atrial fibrillation: Secondary | ICD-10-CM | POA: Diagnosis not present

## 2017-06-10 DIAGNOSIS — Z23 Encounter for immunization: Secondary | ICD-10-CM | POA: Diagnosis not present

## 2017-06-10 DIAGNOSIS — S41112A Laceration without foreign body of left upper arm, initial encounter: Secondary | ICD-10-CM | POA: Diagnosis not present

## 2017-06-16 DIAGNOSIS — R0602 Shortness of breath: Secondary | ICD-10-CM | POA: Diagnosis not present

## 2017-06-17 ENCOUNTER — Inpatient Hospital Stay
Admission: EM | Admit: 2017-06-17 | Discharge: 2017-06-18 | DRG: 813 | Disposition: A | Discharge status: Home / Self Care | Payer: Medicare Other | Attending: Internal Medicine | Admitting: Internal Medicine

## 2017-06-17 ENCOUNTER — Inpatient Hospital Stay: Payer: Medicare Other

## 2017-06-17 ENCOUNTER — Emergency Department: Payer: Medicare Other

## 2017-06-17 DIAGNOSIS — T45515A Adverse effect of anticoagulants, initial encounter: Secondary | ICD-10-CM | POA: Diagnosis present

## 2017-06-17 DIAGNOSIS — J441 Chronic obstructive pulmonary disease with (acute) exacerbation: Secondary | ICD-10-CM | POA: Diagnosis present

## 2017-06-17 DIAGNOSIS — R918 Other nonspecific abnormal finding of lung field: Secondary | ICD-10-CM

## 2017-06-17 DIAGNOSIS — G259 Extrapyramidal and movement disorder, unspecified: Secondary | ICD-10-CM

## 2017-06-17 DIAGNOSIS — F17210 Nicotine dependence, cigarettes, uncomplicated: Secondary | ICD-10-CM | POA: Diagnosis not present

## 2017-06-17 DIAGNOSIS — I248 Other forms of acute ischemic heart disease: Secondary | ICD-10-CM | POA: Diagnosis not present

## 2017-06-17 DIAGNOSIS — Y92009 Unspecified place in unspecified non-institutional (private) residence as the place of occurrence of the external cause: Secondary | ICD-10-CM

## 2017-06-17 DIAGNOSIS — Z9049 Acquired absence of other specified parts of digestive tract: Secondary | ICD-10-CM

## 2017-06-17 DIAGNOSIS — R7989 Other specified abnormal findings of blood chemistry: Secondary | ICD-10-CM | POA: Diagnosis present

## 2017-06-17 DIAGNOSIS — Z9841 Cataract extraction status, right eye: Secondary | ICD-10-CM

## 2017-06-17 DIAGNOSIS — E785 Hyperlipidemia, unspecified: Secondary | ICD-10-CM | POA: Diagnosis present

## 2017-06-17 DIAGNOSIS — R748 Abnormal levels of other serum enzymes: Secondary | ICD-10-CM | POA: Diagnosis present

## 2017-06-17 DIAGNOSIS — I712 Thoracic aortic aneurysm, without rupture: Secondary | ICD-10-CM | POA: Diagnosis present

## 2017-06-17 DIAGNOSIS — K219 Gastro-esophageal reflux disease without esophagitis: Secondary | ICD-10-CM | POA: Diagnosis present

## 2017-06-17 DIAGNOSIS — M1610 Unilateral primary osteoarthritis, unspecified hip: Secondary | ICD-10-CM | POA: Diagnosis present

## 2017-06-17 DIAGNOSIS — R0902 Hypoxemia: Secondary | ICD-10-CM

## 2017-06-17 DIAGNOSIS — Z8673 Personal history of transient ischemic attack (TIA), and cerebral infarction without residual deficits: Secondary | ICD-10-CM

## 2017-06-17 DIAGNOSIS — Z836 Family history of other diseases of the respiratory system: Secondary | ICD-10-CM

## 2017-06-17 DIAGNOSIS — B182 Chronic viral hepatitis C: Secondary | ICD-10-CM | POA: Diagnosis present

## 2017-06-17 DIAGNOSIS — R0602 Shortness of breath: Secondary | ICD-10-CM | POA: Diagnosis not present

## 2017-06-17 DIAGNOSIS — Z794 Long term (current) use of insulin: Secondary | ICD-10-CM

## 2017-06-17 DIAGNOSIS — E663 Overweight: Secondary | ICD-10-CM | POA: Diagnosis present

## 2017-06-17 DIAGNOSIS — H9190 Unspecified hearing loss, unspecified ear: Secondary | ICD-10-CM | POA: Diagnosis present

## 2017-06-17 DIAGNOSIS — Z6834 Body mass index (BMI) 34.0-34.9, adult: Secondary | ICD-10-CM

## 2017-06-17 DIAGNOSIS — R042 Hemoptysis: Secondary | ICD-10-CM | POA: Diagnosis present

## 2017-06-17 DIAGNOSIS — I110 Hypertensive heart disease with heart failure: Secondary | ICD-10-CM | POA: Diagnosis not present

## 2017-06-17 DIAGNOSIS — R079 Chest pain, unspecified: Secondary | ICD-10-CM | POA: Diagnosis not present

## 2017-06-17 DIAGNOSIS — G4700 Insomnia, unspecified: Secondary | ICD-10-CM | POA: Diagnosis not present

## 2017-06-17 DIAGNOSIS — Z23 Encounter for immunization: Secondary | ICD-10-CM | POA: Diagnosis not present

## 2017-06-17 DIAGNOSIS — D6832 Hemorrhagic disorder due to extrinsic circulating anticoagulants: Principal | ICD-10-CM | POA: Diagnosis present

## 2017-06-17 DIAGNOSIS — R778 Other specified abnormalities of plasma proteins: Secondary | ICD-10-CM

## 2017-06-17 DIAGNOSIS — I482 Chronic atrial fibrillation: Secondary | ICD-10-CM | POA: Diagnosis not present

## 2017-06-17 DIAGNOSIS — Z9981 Dependence on supplemental oxygen: Secondary | ICD-10-CM

## 2017-06-17 DIAGNOSIS — Z8042 Family history of malignant neoplasm of prostate: Secondary | ICD-10-CM

## 2017-06-17 DIAGNOSIS — Z7901 Long term (current) use of anticoagulants: Secondary | ICD-10-CM

## 2017-06-17 DIAGNOSIS — I10 Essential (primary) hypertension: Secondary | ICD-10-CM | POA: Diagnosis not present

## 2017-06-17 DIAGNOSIS — F329 Major depressive disorder, single episode, unspecified: Secondary | ICD-10-CM | POA: Diagnosis present

## 2017-06-17 DIAGNOSIS — E119 Type 2 diabetes mellitus without complications: Secondary | ICD-10-CM | POA: Diagnosis not present

## 2017-06-17 DIAGNOSIS — F1020 Alcohol dependence, uncomplicated: Secondary | ICD-10-CM | POA: Diagnosis present

## 2017-06-17 DIAGNOSIS — I2510 Atherosclerotic heart disease of native coronary artery without angina pectoris: Secondary | ICD-10-CM | POA: Diagnosis present

## 2017-06-17 DIAGNOSIS — J9611 Chronic respiratory failure with hypoxia: Secondary | ICD-10-CM | POA: Diagnosis present

## 2017-06-17 DIAGNOSIS — Z961 Presence of intraocular lens: Secondary | ICD-10-CM | POA: Diagnosis present

## 2017-06-17 DIAGNOSIS — Z79899 Other long term (current) drug therapy: Secondary | ICD-10-CM

## 2017-06-17 DIAGNOSIS — G8929 Other chronic pain: Secondary | ICD-10-CM | POA: Diagnosis present

## 2017-06-17 DIAGNOSIS — I509 Heart failure, unspecified: Secondary | ICD-10-CM | POA: Diagnosis not present

## 2017-06-17 DIAGNOSIS — Z8249 Family history of ischemic heart disease and other diseases of the circulatory system: Secondary | ICD-10-CM

## 2017-06-17 DIAGNOSIS — J431 Panlobular emphysema: Secondary | ICD-10-CM | POA: Diagnosis not present

## 2017-06-17 DIAGNOSIS — Z811 Family history of alcohol abuse and dependence: Secondary | ICD-10-CM

## 2017-06-17 DIAGNOSIS — R0600 Dyspnea, unspecified: Secondary | ICD-10-CM | POA: Diagnosis not present

## 2017-06-17 DIAGNOSIS — G4730 Sleep apnea, unspecified: Secondary | ICD-10-CM | POA: Diagnosis present

## 2017-06-17 DIAGNOSIS — K7030 Alcoholic cirrhosis of liver without ascites: Secondary | ICD-10-CM | POA: Diagnosis present

## 2017-06-17 DIAGNOSIS — Z833 Family history of diabetes mellitus: Secondary | ICD-10-CM

## 2017-06-17 LAB — BASIC METABOLIC PANEL
ANION GAP: 8 (ref 5–15)
Anion gap: 10 (ref 5–15)
BUN: 25 mg/dL — AB (ref 6–20)
BUN: 23 mg/dL — ABNORMAL HIGH (ref 6–20)
CALCIUM: 8.3 mg/dL — AB (ref 8.9–10.3)
CALCIUM: 8.6 mg/dL — AB (ref 8.9–10.3)
CHLORIDE: 99 mmol/L — AB (ref 101–111)
CO2: 26 mmol/L (ref 22–32)
CO2: 27 mmol/L (ref 22–32)
Chloride: 96 mmol/L — ABNORMAL LOW (ref 101–111)
Creatinine, Ser: 1.04 mg/dL (ref 0.61–1.24)
Creatinine, Ser: 1.06 mg/dL (ref 0.61–1.24)
GFR calc Af Amer: >60 mL/min (ref >60)
GFR calc Af Amer: >60 mL/min (ref >60)
GFR calc non Af Amer: >60 mL/min (ref >60)
GLUCOSE: 170 mg/dL — AB (ref 65–99)
GLUCOSE: 158 mg/dL — AB (ref 65–99)
Potassium: 4.3 mmol/L (ref 3.5–5.1)
Potassium: 4.7 mmol/L (ref 3.5–5.1)
Sodium: 132 mmol/L — ABNORMAL LOW (ref 135–145)
Sodium: 134 mmol/L — ABNORMAL LOW (ref 135–145)

## 2017-06-17 LAB — TYPE AND SCREEN
ABO/RH(D): O POS
Antibody Screen: NEGATIVE

## 2017-06-17 LAB — CBC
HCT: 26.3 % — ABNORMAL LOW (ref 40.0–52.0)
HCT: 27.5 % — ABNORMAL LOW (ref 40.0–52.0)
HEMOGLOBIN: 8.4 g/dL — AB (ref 13.0–18.0)
Hemoglobin: 8 g/dL — ABNORMAL LOW (ref 13.0–18.0)
MCH: 21.9 pg — AB (ref 26.0–34.0)
MCH: 21.8 pg — AB (ref 26.0–34.0)
MCHC: 30.5 g/dL — AB (ref 32.0–36.0)
MCHC: 30.4 g/dL — AB (ref 32.0–36.0)
MCV: 71.7 fL — ABNORMAL LOW (ref 80.0–100.0)
MCV: 71.5 fL — AB (ref 80.0–100.0)
Platelets: 211 10*3/uL (ref 150–440)
Platelets: 197 10*3/uL (ref 150–440)
RBC: 3.66 MIL/uL — ABNORMAL LOW (ref 4.40–5.90)
RBC: 3.84 MIL/uL — ABNORMAL LOW (ref 4.40–5.90)
RDW: 19.4 % — AB (ref 11.5–14.5)
RDW: 20 % — ABNORMAL HIGH (ref 11.5–14.5)
WBC: 6.8 10*3/uL (ref 3.8–10.6)
WBC: 6.4 10*3/uL (ref 3.8–10.6)

## 2017-06-17 LAB — TROPONIN I
TROPONIN I: 0.11 ng/mL — AB (ref <0.03)
TROPONIN I: 0.11 ng/mL — AB (ref <0.03)
Troponin I: 0.07 ng/mL (ref <0.03)
Troponin I: 0.06 ng/mL (ref <0.03)

## 2017-06-17 LAB — PROTIME-INR
INR: 4.01
INR: 3.85
Prothrombin Time: 40.1 seconds — ABNORMAL HIGH (ref 11.4–15.2)
Prothrombin Time: 38.8 seconds — ABNORMAL HIGH (ref 11.4–15.2)

## 2017-06-17 MED ORDER — NICOTINE 21 MG/24HR TD PT24
21.0000 mg | MEDICATED_PATCH | Freq: Every day | TRANSDERMAL | Status: DC
  Administered 2017-06-17 – 2017-06-18 (×2): 21 mg via TRANSDERMAL
  Filled 2017-06-17 (×3): qty 1

## 2017-06-17 MED ORDER — ONDANSETRON HCL 4 MG/2ML IJ SOLN
4.0000 mg | Freq: Four times a day (QID) | INTRAMUSCULAR | Status: DC | PRN
  Administered 2017-06-17: 4 mg via INTRAVENOUS
  Filled 2017-06-17: qty 2

## 2017-06-17 MED ORDER — HYDROCODONE-ACETAMINOPHEN 5-325 MG PO TABS
1.0000 | ORAL_TABLET | Freq: Four times a day (QID) | ORAL | Status: DC | PRN
  Administered 2017-06-17 – 2017-06-18 (×4): 1 via ORAL
  Filled 2017-06-17 (×4): qty 1

## 2017-06-17 MED ORDER — METHYLPREDNISOLONE SODIUM SUCC 125 MG IJ SOLR
60.0000 mg | Freq: Four times a day (QID) | INTRAMUSCULAR | Status: DC
  Administered 2017-06-17 – 2017-06-18 (×4): 60 mg via INTRAVENOUS
  Filled 2017-06-17 (×4): qty 2

## 2017-06-17 MED ORDER — ACETAMINOPHEN 650 MG RE SUPP: 650.0000 mg | Freq: Four times a day (QID) | RECTAL | Status: DC | PRN

## 2017-06-17 MED ORDER — ZOLPIDEM TARTRATE 5 MG PO TABS
5.0000 mg | ORAL_TABLET | Freq: Every evening | ORAL | Status: DC | PRN
  Administered 2017-06-17: 5 mg via ORAL
  Filled 2017-06-17: qty 1

## 2017-06-17 MED ORDER — MORPHINE SULFATE (PF) 2 MG/ML IV SOLN
2.0000 mg | INTRAVENOUS | Status: DC | PRN
  Administered 2017-06-17 (×2): 2 mg via INTRAVENOUS
  Filled 2017-06-17 (×2): qty 1

## 2017-06-17 MED ORDER — SENNOSIDES-DOCUSATE SODIUM 8.6-50 MG PO TABS: 1.0000 | ORAL_TABLET | Freq: Every evening | ORAL | Status: DC | PRN

## 2017-06-17 MED ORDER — ONDANSETRON HCL 4 MG PO TABS: 4.0000 mg | ORAL_TABLET | Freq: Four times a day (QID) | ORAL | Status: DC | PRN

## 2017-06-17 MED ORDER — SOTALOL HCL 80 MG PO TABS
80.0000 mg | ORAL_TABLET | Freq: Two times a day (BID) | ORAL | Status: DC
  Administered 2017-06-17 – 2017-06-18 (×3): 80 mg via ORAL
  Filled 2017-06-17 (×4): qty 1

## 2017-06-17 MED ORDER — PNEUMOCOCCAL VAC POLYVALENT 25 MCG/0.5ML IJ INJ
0.5000 mL | INJECTION | INTRAMUSCULAR | Status: AC
  Administered 2017-06-18: 0.5 mL via INTRAMUSCULAR
  Filled 2017-06-17: qty 0.5

## 2017-06-17 MED ORDER — METHYLPREDNISOLONE SODIUM SUCC 125 MG IJ SOLR
125.0000 mg | Freq: Four times a day (QID) | INTRAMUSCULAR | Status: DC
  Administered 2017-06-17: 125 mg via INTRAVENOUS
  Filled 2017-06-17: qty 2

## 2017-06-17 MED ORDER — ACETAMINOPHEN 325 MG PO TABS: 650.0000 mg | ORAL_TABLET | Freq: Four times a day (QID) | ORAL | Status: DC | PRN

## 2017-06-17 MED ORDER — IPRATROPIUM-ALBUTEROL 0.5-2.5 (3) MG/3ML IN SOLN
3.0000 mL | Freq: Once | RESPIRATORY_TRACT | Status: AC
  Administered 2017-06-17: 3 mL via RESPIRATORY_TRACT
  Filled 2017-06-17: qty 3

## 2017-06-17 MED ORDER — IPRATROPIUM-ALBUTEROL 0.5-2.5 (3) MG/3ML IN SOLN
3.0000 mL | Freq: Four times a day (QID) | RESPIRATORY_TRACT | Status: DC
  Administered 2017-06-17 – 2017-06-18 (×5): 3 mL via RESPIRATORY_TRACT
  Filled 2017-06-17 (×5): qty 3

## 2017-06-17 MED ORDER — METHYLPREDNISOLONE SODIUM SUCC 125 MG IJ SOLR
125.0000 mg | Freq: Once | INTRAMUSCULAR | Status: AC
  Administered 2017-06-17: 125 mg via INTRAVENOUS
  Filled 2017-06-17: qty 2

## 2017-06-17 MED ORDER — VITAMIN K1 10 MG/ML IJ SOLN
10.0000 mg | Freq: Once | INTRAMUSCULAR | Status: AC
  Administered 2017-06-17: 10 mg via SUBCUTANEOUS
  Filled 2017-06-17: qty 1

## 2017-06-17 MED ORDER — SODIUM CHLORIDE 0.9 % IV SOLN
INTRAVENOUS | Status: DC
  Administered 2017-06-17: 06:00:00 via INTRAVENOUS

## 2017-06-17 NOTE — Progress Notes (Signed)
No hemoptysis this shift,pulmonary consultation done and no planned intervention.

## 2017-06-17 NOTE — ED Notes (Signed)
Admitting Provider at bedside. 

## 2017-06-17 NOTE — ED Notes (Signed)
Spoke with MD Pyreddy about patient's room assignment. Updated patient and family on patient's plan of care

## 2017-06-17 NOTE — Progress Notes (Signed)
Admitted for hemoptysis on perirectal I am not all 4. Hemoglobin stable at 8.4 today, his vitamin K 10 mg subcutaneous, INR done 23 today. Patient wants to eat. No further complaints. Lab data, physical exam done. Vitals normal.   Assessment and plan;  #1 hemoptysis due to coagulopathy with elevated INR on admission; hemoglobin stable, INR 4 on admission, received Vitamin K, INR done to 3.85. Hemodynamically stable. Hemoglobin stable. 8.4, start clear liquids, no indication for transfusion . #2 COPD exacerbation: Clinically improving, continue bronchodilators, adjusted the steroids,chronic respiratory failure;on 3 litres o2 all the time,  #3 chronic pain ; wan 4, chronic A. fib: Continue sotalol, hold warfarin. #5 diabetes mellitus type 2:;continue SSI  With coverage.resume oral medications from tomorrow.

## 2017-06-17 NOTE — ED Provider Notes (Signed)
[facility] Emergency Department Provider Note   ____________________________________________   First MD Initiated Contact with Patient 06/17/17 0101     (approximate)  I have reviewed the triage vital signs and the nursing notes.   HISTORY  Chief Complaint Weakness; Hemoptysis; and Shortness of Breath    HPI Marvin Lewis is a 66 y.o. male who comes into the hospital today with some hemoptysis. The patient reports that he has been coughing up bright red blood and clots for the past hour. He became really weak and short of breath so he called the ambulance to come in and get checked out. He reports that he had contacted his wife to start his nebulizer and after he finished with the breathing treatment he came to bed. He was sitting on the side of the bed when he started coughing. He asked his wife to turn the light on and they noticed that there was blood when he was coughing. He had multiple large spots of blood on pillows as well as on a rag. His wife could not quantify how much but she said it was quite a bit. The patient reports that he did have some right-sided chest pain over the last few weeks but has had some pain on the left as well. He has also been coughing a lot for the past few weeks and his wife reports that he's been having some increased shortness of breath. She states that his oxygen levels have been in the mid to high 80s over the last few days. He is here for evaluation. The patient rates his pain a 7/10 in intensity   Past Medical History:  Diagnosis Date  . A-fib ([place])   . Alcohol abuse   . Alcohol use   . Aortic aneurysm, intrathoracic ([place])   . Ascending aortic aneurysm ([place])   . Ascending aortic aneurysm ([place])   . Atrial fibrillation ([place]) 05/08/2016  . Broken rib April 2016   History of broken ribs x 3  . Bronchitis   . CAD (coronary artery disease)   . CHF (congestive heart failure) ([place])   . Chronic alcoholism ([place])   .  Cirrhosis ([place])   . Closed nondisplaced fracture of fifth metatarsal bone of left foot Nov. 2016   left  . COPD (chronic obstructive pulmonary disease) ([place])   . Depression   . Diabetes mellitus without complication ([place])   . Emphysema of lung ([place])   . Esophageal rupture   . GERD (gastroesophageal reflux disease)   . Hepatitis C   . Hip arthritis   . History of diverticulitis    ruptured and sepsis  . History of hiatal hernia   . History of smoking   . HOH (hard of hearing)   . Hyperlipidemia   . Hypertension   . Hypoxemia   . Insomnia   . Movement disorder   . Onychogryphosis   . Pneumonia   . Rib fracture   . Seasonal allergies   . Shortness of breath dyspnea   . Sleep apnea    uses O2 @2  L/min at night  . Stroke ([place])   . Thrombocytopenia [facility])     Patient Active Problem List   Diagnosis Date Noted  . Hemoptysis 06/17/2017  . A-fib ([place]) 05/21/2016  . Leg cramps 05/08/2016  . Anemia 05/08/2016  . Atrial fibrillation ([place]) 05/08/2016  . Abnormal weight gain 04/22/2016  . Medication monitoring encounter 04/22/2016  . Elevated brain natriuretic peptide (BNP) level 04/22/2016  .  Persistent atrial fibrillation ([place]) 03/29/2016  . Breathlessness on exertion 03/27/2016  . Gout 02/23/2016  . Hyperkalemia 01/12/2016  . Onychomycosis 01/10/2016  . Hoarseness of voice 01/10/2016  . Tobacco abuse 01/10/2016  . Hx of completed stroke 01/10/2016  . Athletes foot 08/09/2015  . Chronic alcoholism ([place])   . Movement disorder   . COPD (chronic obstructive pulmonary disease) ([place])   . Emphysema of lung ([place])   . Type II diabetes mellitus, uncontrolled ([place])   . Hyperlipidemia   . Hypertension   . CAD (coronary artery disease)   . Hepatitis C   . Cirrhosis ([place])   . Alcohol abuse   . Onychogryphosis   . Insomnia   . Hip arthritis   . Hypoxemia   . 1st degree AV block   . Controlled type 2 diabetes mellitus without complication ([place]) 08/67/6195    Past Surgical  History:  Procedure Laterality Date  . CATARACT EXTRACTION W/PHACO Right 01/16/2016   Procedure: CATARACT EXTRACTION PHACO AND INTRAOCULAR LENS PLACEMENT (IOC);  Surgeon: Birder Robson, MD;  Location: [facility] ORS;  Service: Ophthalmology;  Laterality: Right;  [place]: 00:45.0 AP%: 25.2 CDE: 11.36  Lot #[id number]  . CHOLECYSTECTOMY    . COLON RESECTION    . COLONOSCOPY WITH PROPOFOL N/A 08/27/2016   Procedure: COLONOSCOPY WITH PROPOFOL;  Surgeon: Lollie Sails, MD;  Location: [facility] ENDOSCOPY;  Service: Endoscopy;  Laterality: N/A;  . ELECTROPHYSIOLOGIC STUDY N/A 05/22/2016   Procedure: CARDIOVERSION;  Surgeon: Teodoro Spray, MD;  Location: [facility] ORS;  Service: Cardiovascular;  Laterality: N/A;  . HERNIA REPAIR    . PEG PLACEMENT N/A   . PEG TUBE REMOVAL N/A   . PILONIDAL CYST EXCISION N/A   . TRACHEOSTOMY N/A     Prior to Admission medications   Medication Sig Start Date End Date Taking? Authorizing Provider  albuterol (PROVENTIL) (2.5 MG/3ML) 0.083% nebulizer solution Take 2.5 mg by nebulization every 6 (six) hours as needed for wheezing or shortness of breath.   Yes [provider]  amLODipine (NORVASC) 5 MG tablet Take 5 mg by mouth daily. 06/13/17  Yes [provider]  atorvastatin (LIPITOR) 10 MG tablet Take 1 tablet (10 mg total) by mouth at bedtime. 11/09/16  Yes Lada, Satira Anis, MD  B-D INS SYRINGE 0.5CC/30GX1/2" 30G X 1/2" 0.5 ML MISC AS DIRECTED. 07/17/15  Yes Guadalupe Maple, MD  B-D INS SYRINGE 0.5CC/31GX5/16 31G X 5/16" 0.5 ML MISC AS DIRECTED. 01/02/17  Yes Johnson, Megan P, DO  benazepril (LOTENSIN) 10 MG tablet Take 10 mg by mouth 2 (two) times daily. 06/13/17  Yes [provider]  benazepril (LOTENSIN) 20 MG tablet Take 1 tablet (20 mg total) by mouth daily. Avoid salt substitutes and fruit smoothies; 08/14/16  Yes Lada, Satira Anis, MD  furosemide (LASIX) 20 MG tablet Take 20 mg by mouth.   Yes [provider]  insulin aspart (NOVOLOG) 100 UNIT/ML  FlexPen Sliding scale Sub-Q for FSBS 200-249 give 3 units, 250-299 give 5 units, 300-349 give 7 units, 350-399 give 9 units, 400+ give 12 units 05/22/16  Yes Lada, Satira Anis, MD  metFORMIN (GLUCOPHAGE-XR) 500 MG 24 hr tablet Take 1,000 mg by mouth daily. 05/27/17  Yes [provider]  omeprazole (PRILOSEC) 20 MG capsule Take 1 capsule (20 mg total) by mouth daily. Caution:prolonged use may increase risk of pneumonia, colitis, osteoporosis, anemia 05/15/16  Yes Lada, Satira Anis, MD  sotalol (BETAPACE) 80 MG tablet Take 1 tablet (80 mg total) by mouth  2 (two) times daily. 05/22/16  Yes FathJavier Docker, MD  TRESIBA FLEXTOUCH 200 UNIT/ML SOPN Inject 46 Units into the skin daily.  06/13/17  Yes [provider]  warfarin (COUMADIN) 2 MG tablet Take 4 mg by mouth as directed.  06/02/17  Yes [provider]  amLODipine (NORVASC) 10 MG tablet Take 1 tablet (10 mg total) by mouth daily. Patient not taking: Reported on 06/17/2017 01/12/16   Arnetha Courser, MD  apixaban (ELIQUIS) 5 MG TABS tablet Take 5 mg by mouth 2 (two) times daily.    [provider]  insulin glargine (LANTUS) 100 UNIT/ML injection Inject 0.55 mLs (55 Units total) into the skin at bedtime. Or as directed Patient not taking: Reported on 06/17/2017 05/08/16   Arnetha Courser, MD    Allergies Patient has no known allergies.  Family History  Problem Relation Age of Onset  . Coronary artery disease Mother   . Hypertension Mother   . Heart disease Mother   . Lung disease Mother   . Coronary artery disease Father   . Hypertension Father   . Diabetes Father   . Heart disease Father   . Cancer Father        prostate  . Alcohol abuse Brother     Social History Social History  Substance Use Topics  . Smoking status: Current Every Day Smoker    Packs/day: 0.25    Years: 40.00    Types: Cigarettes  . Smokeless tobacco: Never Used  . Alcohol use 0.0 oz/week     Comment: patient states he no longer drinks  liquor, states he drinks very little    Review of Systems  Constitutional: No fever/chills Eyes: No visual changes. ENT: No sore throat. Cardiovascular: chest pain. Respiratory: cough, hemoptysis shortness of breath. Gastrointestinal: No abdominal pain.  No nausea, no vomiting.  No diarrhea.  No constipation. Genitourinary: Negative for dysuria. Musculoskeletal: Negative for back pain. Skin: Negative for rash. Neurological: Negative for headaches, focal weakness or numbness.   ____________________________________________   PHYSICAL EXAM:  VITAL SIGNS: ED Triage Vitals  Enc Vitals Group     BP 06/17/17 0110 139/83     Pulse Rate 06/17/17 0105 68     Resp 06/17/17 0110 (!) 21     Temp 06/17/17 0105 97.8 F (36.6 C)     Temp src --      SpO2 06/17/17 0105 90 %     Weight --      Height --      Head Circumference --      Peak Flow --      Pain Score 06/17/17 0110 7     Pain Loc --      Pain Edu? --      Excl. in [place]? --     Constitutional: Alert and oriented. Ill appearing and in moderate distress. Eyes: Conjunctivae are normal. PERRL. EOMI. Head: Atraumatic. Nose: No congestion/rhinnorhea. Mouth/Throat: Mucous membranes are moist.  Oropharynx non-erythematous. Dried blood in oropharynx Cardiovascular: Normal rate, regular rhythm. Grossly normal heart sounds.  Good peripheral circulation. Respiratory: Mildly increased respiratory effort.  No retractions. Diminished breath sounds on the right with some wheezing on the left. Gastrointestinal: Soft and nontender. No distention. Positive bowel sounds Musculoskeletal: No lower extremity tenderness nor edema.   Neurologic:  Normal speech and language. Skin:  Skin is warm, dry and intact.  Psychiatric: Mood and affect are normal.   ____________________________________________   LABS (all labs ordered are listed, but  only abnormal results are displayed)  Labs Reviewed  BASIC METABOLIC PANEL - Abnormal; Notable for the  following:       Result Value   Sodium 132 (*)    Chloride 96 (*)    Glucose, Bld 170 (*)    BUN 25 (*)    Calcium 8.3 (*)    All other components within normal limits  CBC - Abnormal; Notable for the following:    RBC 3.66 (*)    Hemoglobin 8.0 (*)    HCT 26.3 (*)    MCV 71.7 (*)    MCH 21.9 (*)    MCHC 30.5 (*)    RDW 19.4 (*)    All other components within normal limits  TROPONIN I - Abnormal; Notable for the following:    Troponin I 0.11 (*)    All other components within normal limits  PROTIME-INR - Abnormal; Notable for the following:    Prothrombin Time 40.1 (*)    INR 4.01 (*)    All other components within normal limits  PROTIME-INR  TYPE AND SCREEN   ____________________________________________  EKG  ED ECG REPORT I, Loney Hering, the attending physician, personally viewed and interpreted this ECG.   Date: 06/17/2017  EKG Time: 112  Rate: 70  Rhythm: normal sinus rhythm  Axis: normal axis  Intervals:none  ST&T Change: ST depression in lead V4, V5 and V6  ____________________________________________  RADIOLOGY  Dg Chest Port 1 View  Result Date: 06/17/2017 CLINICAL DATA:  Shortness of breath. Right-sided chest pain. Hemoptysis. EXAM: PORTABLE CHEST 1 VIEW COMPARISON:  CT 10/15/2016 FINDINGS: Chronic volume loss in the right hemithorax. Known calcified right infrahilar mass is not well seen radiographically. Heart appears prominent size likely accentuated by AP technique, mediastinal contours are unchanged. Right lower lobe atelectasis with possible increase. No pleural effusion or pneumothorax. No acute osseous abnormalities. IMPRESSION: Chronic volume loss in the right hemithorax secondary to known partially calcified right infrahilar mass. Probable increase in right basilar atelectasis. Electronically Signed   By: Jeb Levering M.D.   On: 06/17/2017 01:43    ____________________________________________   PROCEDURES  Procedure(s) performed:  None  Procedures  Critical Care performed: No  ____________________________________________   INITIAL IMPRESSION / ASSESSMENT AND PLAN / ED COURSE  Pertinent labs & imaging results that were available during my care of the patient were reviewed by me and considered in my medical decision making (see chart for details).  This is a 66 year old who comes into the hospital today with hemoptysis. The patient's hemoglobin has decreased from 11-8 since September 2017. The patient also has some elevation of his troponin of 0.11. This may explain his chest pain as well as his ST depressions on his EKG. The patient did receive a DuoNeb treatment as well as some Solu-Medrol but I will admit the patient to the hospitalist service for further evaluation. I could not give the patient heparin since his INR is 4.01  The patient had some more coughing in the ED but there was some minimal dark sputum in the gauze. We did do a type and screen but the patient did not receive any blood down here. He was hypoxic initially with O2 sats in the high 80s but we did place him on 2 L. He does wear O2 at home at night but not consistently. The patient will be admitted to the hospitalist service for further evaluation of his symptoms.      ____________________________________________   FINAL CLINICAL IMPRESSION(S) /  ED DIAGNOSES  Final diagnoses:  Hemoptysis  Elevated troponin  Hypoxia      NEW MEDICATIONS STARTED DURING THIS VISIT:  New Prescriptions   No medications on file     Note:  This document was prepared using Dragon voice recognition software and may include unintentional dictation errors.    Loney Hering, MD 06/17/17 [phone]

## 2017-06-17 NOTE — H&P (Signed)
[name] NAME: Marvin Lewis    MR#:  [id number]  DATE OF BIRTH:  Apr 07, 1951  DATE OF ADMISSION:  06/17/2017  PRIMARY CARE PHYSICIAN: Arnetha Courser, MD   REQUESTING/REFERRING PHYSICIAN:   CHIEF COMPLAINT:   Chief Complaint  Patient presents with  . Weakness  . Hemoptysis  . Shortness of Breath    HISTORY OF PRESENT ILLNESS: Marvin Lewis  is a 66 y.o. male with a known history of Atrial fibrillation on warfarin for anticoagulation, admitted aneurysm, coronary artery disease, congestive heart failure, cirrhosis of liver, COPD, hepatitis C, hypertension, hyperlipidemia, sleep apnea presented to the emergency room with coughing of blood. Patient coughed up blood yesterday evening and according to the family members it was large amount, patient has history of lung mass which has been worked up. Current chest x-ray showed calcified right hilar mass with no further increase in size. Patient is an active smoker. His INR is greater than 4 and he is on warfarin for anticoagulation. Has some wheezing and shortness of breath. Patient uses oxygen at bedtime. He was evaluated in the emergency room his hemoglobin was around 8. Hemoglobin was around 11 in September last year. Hospitalist service was consulted for further care of the patient. His troponin level is also elevated has some right-sided chest pain. The pain is aching in nature 4 out of 10 on a scale of 1-10.  PAST MEDICAL HISTORY:   Past Medical History:  Diagnosis Date  . A-fib ([place])   . Alcohol abuse   . Alcohol use   . Aortic aneurysm, intrathoracic ([place])   . Ascending aortic aneurysm ([place])   . Ascending aortic aneurysm ([place])   . Atrial fibrillation ([place]) 05/08/2016  . Broken rib April 2016   History of broken ribs x 3  . Bronchitis   . CAD (coronary artery disease)   . CHF (congestive heart failure) ([place])   . Chronic alcoholism ([place])   . Cirrhosis ([place])   .  Closed nondisplaced fracture of fifth metatarsal bone of left foot Nov. 2016   left  . COPD (chronic obstructive pulmonary disease) ([place])   . Depression   . Diabetes mellitus without complication ([place])   . Emphysema of lung ([place])   . Esophageal rupture   . GERD (gastroesophageal reflux disease)   . Hepatitis C   . Hip arthritis   . History of diverticulitis    ruptured and sepsis  . History of hiatal hernia   . History of smoking   . HOH (hard of hearing)   . Hyperlipidemia   . Hypertension   . Hypoxemia   . Insomnia   . Movement disorder   . Onychogryphosis   . Pneumonia   . Rib fracture   . Seasonal allergies   . Shortness of breath dyspnea   . Sleep apnea    uses O2 @2  L/min at night  . Stroke ([place])   . Thrombocytopenia ([place])     PAST SURGICAL HISTORY: Past Surgical History:  Procedure Laterality Date  . CATARACT EXTRACTION W/PHACO Right 01/16/2016   Procedure: CATARACT EXTRACTION PHACO AND INTRAOCULAR LENS PLACEMENT (IOC);  Surgeon: Birder Robson, MD;  Location: [facility] ORS;  Service: Ophthalmology;  Laterality: Right;  [place]: 00:45.0 AP%: 25.2 CDE: 11.36  Lot #[id number]  . CHOLECYSTECTOMY    . COLON RESECTION    . COLONOSCOPY WITH PROPOFOL N/A 08/27/2016   Procedure: COLONOSCOPY WITH PROPOFOL;  Surgeon: Lollie Sails, MD;  Location: [facility] ENDOSCOPY;  Service: Endoscopy;  Laterality: N/A;  . ELECTROPHYSIOLOGIC STUDY N/A 05/22/2016   Procedure: CARDIOVERSION;  Surgeon: Teodoro Spray, MD;  Location: [facility] ORS;  Service: Cardiovascular;  Laterality: N/A;  . HERNIA REPAIR    . PEG PLACEMENT N/A   . PEG TUBE REMOVAL N/A   . PILONIDAL CYST EXCISION N/A   . TRACHEOSTOMY N/A     SOCIAL HISTORY:  Social History  Substance Use Topics  . Smoking status: Current Every Day Smoker    Packs/day: 0.25    Years: 40.00    Types: Cigarettes  . Smokeless tobacco: Never Used  . Alcohol use 0.0 oz/week     Comment: patient states he no longer drinks liquor, states he drinks very  little    FAMILY HISTORY:  Family History  Problem Relation Age of Onset  . Coronary artery disease Mother   . Hypertension Mother   . Heart disease Mother   . Lung disease Mother   . Coronary artery disease Father   . Hypertension Father   . Diabetes Father   . Heart disease Father   . Cancer Father        prostate  . Alcohol abuse Brother     DRUG ALLERGIES: No Known Allergies  REVIEW OF SYSTEMS:   CONSTITUTIONAL: No fever, has weakness.  EYES: No blurred or double vision.  EARS, NOSE, AND THROAT: No tinnitus or ear pain.  RESPIRATORY: Has cough, shortness of breath, wheezing and hemoptysis.  CARDIOVASCULAR: No chest pain, orthopnea, edema.  GASTROINTESTINAL: No nausea, vomiting, diarrhea or abdominal pain.  GENITOURINARY: No dysuria, hematuria.  ENDOCRINE: No polyuria, nocturia,  HEMATOLOGY: No anemia, easy bruising or bleeding SKIN: No rash or lesion. MUSCULOSKELETAL: No joint pain or arthritis.   NEUROLOGIC: No tingling, numbness, weakness.  PSYCHIATRY: No anxiety or depression.   MEDICATIONS AT HOME:  Prior to Admission medications   Medication Sig Start Date End Date Taking? Authorizing Provider  albuterol (PROVENTIL) (2.5 MG/3ML) 0.083% nebulizer solution Take 2.5 mg by nebulization every 6 (six) hours as needed for wheezing or shortness of breath.   Yes [provider]  amLODipine (NORVASC) 5 MG tablet Take 5 mg by mouth daily. 06/13/17  Yes [provider]  atorvastatin (LIPITOR) 10 MG tablet Take 1 tablet (10 mg total) by mouth at bedtime. 11/09/16  Yes Lada, Satira Anis, MD  B-D INS SYRINGE 0.5CC/30GX1/2" 30G X 1/2" 0.5 ML MISC AS DIRECTED. 07/17/15  Yes Guadalupe Maple, MD  B-D INS SYRINGE 0.5CC/31GX5/16 31G X 5/16" 0.5 ML MISC AS DIRECTED. 01/02/17  Yes Johnson, Megan P, DO  benazepril (LOTENSIN) 10 MG tablet Take 10 mg by mouth 2 (two) times daily. 06/13/17  Yes [provider]  benazepril (LOTENSIN) 20 MG tablet Take 1 tablet (20 mg  total) by mouth daily. Avoid salt substitutes and fruit smoothies; 08/14/16  Yes Lada, Satira Anis, MD  furosemide (LASIX) 20 MG tablet Take 20 mg by mouth.   Yes [provider]  insulin aspart (NOVOLOG) 100 UNIT/ML FlexPen Sliding scale Sub-Q for FSBS 200-249 give 3 units, 250-299 give 5 units, 300-349 give 7 units, 350-399 give 9 units, 400+ give 12 units 05/22/16  Yes Lada, Satira Anis, MD  metFORMIN (GLUCOPHAGE-XR) 500 MG 24 hr tablet Take 1,000 mg by mouth daily. 05/27/17  Yes [provider]  omeprazole (PRILOSEC) 20 MG capsule Take 1 capsule (20 mg total) by mouth daily. Caution:prolonged use may increase risk of pneumonia, colitis, osteoporosis, anemia 05/15/16  Yes Lada, Satira Anis, MD  sotalol (BETAPACE) 80 MG tablet Take 1 tablet (80 mg total) by mouth 2 (two) times daily. 05/22/16  Yes FathJavier Docker, MD  TRESIBA FLEXTOUCH 200 UNIT/ML SOPN Inject 46 Units into the skin daily.  06/13/17  Yes [provider]  warfarin (COUMADIN) 2 MG tablet Take 4 mg by mouth as directed.  06/02/17  Yes [provider]  amLODipine (NORVASC) 10 MG tablet Take 1 tablet (10 mg total) by mouth daily. Patient not taking: Reported on 06/17/2017 01/12/16   Arnetha Courser, MD  apixaban (ELIQUIS) 5 MG TABS tablet Take 5 mg by mouth 2 (two) times daily.    [provider]  insulin glargine (LANTUS) 100 UNIT/ML injection Inject 0.55 mLs (55 Units total) into the skin at bedtime. Or as directed Patient not taking: Reported on 06/17/2017 05/08/16   Arnetha Courser, MD      PHYSICAL EXAMINATION:   VITAL SIGNS: Blood pressure (!) 142/89, pulse 77, temperature 97.8 F (36.6 C), resp. rate (!) 26, SpO2 92 %.  GENERAL:  66 y.o.-year-old patient lying in the bed with no acute distress.  EYES: Pupils equal, round, reactive to light and accommodation. No scleral icterus. Extraocular muscles intact.  HEENT: Head atraumatic, normocephalic. Oropharynx and nasopharynx clear.  NECK:  Supple, no  jugular venous distention. No thyroid enlargement, no tenderness.  LUNGS: Decreased breath sounds bilaterally, bilateral wheezing noted. No use of accessory muscles of respiration.  CARDIOVASCULAR: S1, S2 normal. No murmurs, rubs, or gallops.  ABDOMEN: Soft, nontender, nondistended. Bowel sounds present. No organomegaly or mass.  EXTREMITIES: No pedal edema, cyanosis, or clubbing.  NEUROLOGIC: Cranial nerves II through XII are intact. Muscle strength 5/5 in all extremities. Sensation intact. Gait not checked.  PSYCHIATRIC: The patient is alert and oriented x 3.  SKIN: No obvious rash, lesion, or ulcer.   LABORATORY PANEL:   CBC  Recent Labs Lab 06/17/17 0104  WBC 6.8  HGB 8.0*  HCT 26.3*  PLT 211  MCV 71.7*  MCH 21.9*  MCHC 30.5*  RDW 19.4*   ------------------------------------------------------------------------------------------------------------------  Chemistries   Recent Labs Lab 06/17/17 0104  NA 132*  K 4.3  CL 96*  CO2 26  GLUCOSE 170*  BUN 25*  CREATININE 1.04  CALCIUM 8.3*   ------------------------------------------------------------------------------------------------------------------ CrCl cannot be calculated (Unknown ideal weight.). ------------------------------------------------------------------------------------------------------------------ No results for input(s): TSH, T4TOTAL, T3FREE, THYROIDAB in the last 72 hours.  Invalid input(s): FREET3   Coagulation profile  Recent Labs Lab 06/17/17 0104  INR 4.01*   ------------------------------------------------------------------------------------------------------------------- No results for input(s): DDIMER in the last 72 hours. -------------------------------------------------------------------------------------------------------------------  Cardiac Enzymes  Recent Labs Lab 06/17/17 0104  TROPONINI 0.11*    ------------------------------------------------------------------------------------------------------------------ Invalid input(s): POCBNP  ---------------------------------------------------------------------------------------------------------------  Urinalysis    Component Value Date/Time   COLORURINE Yellow 01/03/2014 0953   APPEARANCEUR Hazy 01/03/2014 0953   LABSPEC 1.016 01/03/2014 0953   PHURINE 6.0 01/03/2014 0953   GLUCOSEU Negative 01/03/2014 0953   HGBUR 2+ 01/03/2014 0953   BILIRUBINUR Negative 01/03/2014 0953   KETONESUR Negative 01/03/2014 0953   PROTEINUR 30 mg/dL 01/03/2014 0953   NITRITE Negative 01/03/2014 0953   LEUKOCYTESUR 3+ 01/03/2014 2993     RADIOLOGY: Dg Chest Port 1 View  Result Date: 06/17/2017 CLINICAL DATA:  Shortness of breath. Right-sided chest pain. Hemoptysis. EXAM: PORTABLE CHEST 1 VIEW COMPARISON:  CT 10/15/2016 FINDINGS: Chronic volume loss in the right hemithorax. Known calcified right infrahilar mass is not well seen radiographically. Heart appears prominent size likely  accentuated by AP technique, mediastinal contours are unchanged. Right lower lobe atelectasis with possible increase. No pleural effusion or pneumothorax. No acute osseous abnormalities. IMPRESSION: Chronic volume loss in the right hemithorax secondary to known partially calcified right infrahilar mass. Probable increase in right basilar atelectasis. Electronically Signed   By: Jeb Levering M.D.   On: 06/17/2017 01:43    EKG: Orders placed or performed during the hospital encounter of 06/17/17  . ED EKG within 10 minutes  . ED EKG within 10 minutes  . EKG 12-Lead  . EKG 12-Lead    IMPRESSION AND PLAN:  66 year old male patient with history of COPD, sleep apnea, hypertension, hepatitis C, cirrhosis of liver presented to the emergency room with wheezing, shortness of breath and cough and coughing of blood. Admitting diagnosis 1. Hemoptysis 2. Acute COPD  exacerbation 3. Elevated troponin could be secondary to demand ischemia 4. Hypertension 5. Cirrhosis of liver 6. Chronic hepatitis C Treatment plan Admit patient to medical floor Monitor hemoglobin and hematocrit Hold warfarin Keep patient nothing by mouth Pulmonary consultation Vitamin K subcutaneously Cycle troponin DVT prophylaxis sequential compression device to lower extremities Supportive care  All the records are reviewed and case discussed with ED provider.  Management plans discussed with the patient, family and they are in agreement.  CODE STATUS:FULL CODE Surrogate decision-maker wife Code Status History    Date Active Date Inactive Code Status Order ID Comments User Context   05/21/2016  9:08 AM 05/22/2016  2:30 PM Full Code [id number]  Teodoro Spray, MD Inpatient   05/26/2015  2:13 PM 05/30/2015  5:25 PM Full Code [id number]  Loletha Grayer, MD ED       TOTAL TIME TAKING CARE OF THIS PATIENT: 54 minutes.    Saundra Shelling M.D on 06/17/2017 at 3:34 AM  Between 7am to 6pm - Pager - [phone]  After 6pm go to [email or web address] - password [email or web address] [facility]  Office  [phone]  CC: Primary care physician; Arnetha Courser, MD

## 2017-06-17 NOTE — Progress Notes (Signed)
Pt complaining of right sided chest/lung pain 8 out of 10, with only tylenol ordered. Pt also would like a nicotine patch, since he is a smoker. MD paged, Dr. Estanislado Pandy gave orders for IV morphine for pain & also orders for a 21mg  nicotine patch. Conley Simmonds, RN, BSN

## 2017-06-17 NOTE — Progress Notes (Signed)
Date: 06/17/2017,   MRN# [id number] Marvin Lewis Apr 20, 1951 Code Status:     Code Status Orders        Start     Ordered   06/17/17 0518  Full code  Continuous     06/17/17 0517    Code Status History    Date Active Date Inactive Code Status Order ID Comments User Context   05/21/2016  9:08 AM 05/22/2016  2:30 PM Full Code [id number]  Teodoro Spray, MD Inpatient   05/26/2015  2:13 PM 05/30/2015  5:25 PM Full Code [id number]  Loletha Grayer, MD ED    Advance Directive Documentation     Most Recent Value  Type of Advance Directive  Living will  Pre-existing out of facility DNR order (yellow form or pink MOST form)  -  "MOST" Form in Place?  -     Hosp day:@LENGTHOFSTAYDAYS @ Referring MD: @ATDPROV @      CC: Hemoptysis    HPI: This is a 66 yo wm, smoker, well known to me came in with hemoptysis, inr 4.01, on coumidin for afib. . Last pm he had a coughing spell after a neb. Subsequently spat up blood. Came to the ER. INR is up, vit k given. He denies recent antibiotics etc. On w/u a known lung mass was seen. That mass had been there over 6 years. No real change ? Carcinoid or some benign granulomatous cause. He refused bx and he is not a surgical candidate per Dr/ Genevive Bi.   At my seeing him the hemoptysis is resolved, he is not having any respiratory difficulty     PMHX:   Past Medical History:  Diagnosis Date  . A-fib ([place])   . Alcohol abuse   . Alcohol use   . Aortic aneurysm, intrathoracic ([place])   . Ascending aortic aneurysm ([place])   . Ascending aortic aneurysm ([place])   . Atrial fibrillation ([place]) 05/08/2016  . Broken rib April 2016   History of broken ribs x 3  . Bronchitis   . CAD (coronary artery disease)   . CHF (congestive heart failure) ([place])   . Chronic alcoholism ([place])   . Cirrhosis ([place])   . Closed nondisplaced fracture of fifth metatarsal bone of left foot Nov. 2016   left  . COPD (chronic obstructive pulmonary disease) ([place])   . Depression   . Diabetes  mellitus without complication ([place])   . Emphysema of lung ([place])   . Esophageal rupture   . GERD (gastroesophageal reflux disease)   . Hepatitis C   . Hip arthritis   . History of diverticulitis    ruptured and sepsis  . History of hiatal hernia   . History of smoking   . HOH (hard of hearing)   . Hyperlipidemia   . Hypertension   . Hypoxemia   . Insomnia   . Movement disorder   . Onychogryphosis   . Pneumonia   . Rib fracture   . Seasonal allergies   . Shortness of breath dyspnea   . Sleep apnea    uses O2 @2  L/min at night  . Stroke ([place])   . Thrombocytopenia ([place])    Surgical Hx:  Past Surgical History:  Procedure Laterality Date  . CATARACT EXTRACTION W/PHACO Right 01/16/2016   Procedure: CATARACT EXTRACTION PHACO AND INTRAOCULAR LENS PLACEMENT (IOC);  Surgeon: Birder Robson, MD;  Location: [facility] ORS;  Service: Ophthalmology;  Laterality: Right;  [place]: 00:45.0 AP%: 25.2 CDE: 11.36  Lot #[id number]  . CHOLECYSTECTOMY    .  COLON RESECTION    . COLONOSCOPY WITH PROPOFOL N/A 08/27/2016   Procedure: COLONOSCOPY WITH PROPOFOL;  Surgeon: Lollie Sails, MD;  Location: [facility] ENDOSCOPY;  Service: Endoscopy;  Laterality: N/A;  . ELECTROPHYSIOLOGIC STUDY N/A 05/22/2016   Procedure: CARDIOVERSION;  Surgeon: Teodoro Spray, MD;  Location: [facility] ORS;  Service: Cardiovascular;  Laterality: N/A;  . HERNIA REPAIR    . PEG PLACEMENT N/A   . PEG TUBE REMOVAL N/A   . PILONIDAL CYST EXCISION N/A   . TRACHEOSTOMY N/A    Family Hx:  Family History  Problem Relation Age of Onset  . Coronary artery disease Mother   . Hypertension Mother   . Heart disease Mother   . Lung disease Mother   . Coronary artery disease Father   . Hypertension Father   . Diabetes Father   . Heart disease Father   . Cancer Father        prostate  . Alcohol abuse Brother    Social Hx:   Social History  Substance Use Topics  . Smoking status: Current Every Day Smoker    Packs/day: 0.25    Years: 40.00     Types: Cigarettes  . Smokeless tobacco: Never Used  . Alcohol use 0.0 oz/week     Comment: patient states he no longer drinks liquor, states he drinks very little   Medication:    Home Medication:    Current Medication: @CURMEDTAB @   Allergies:  Patient has no known allergies.  Review of Systems: Gen:  Denies  fever, sweats, chills HEENT: Denies blurred vision, double vision, ear pain, eye pain, hearing loss, nose bleeds, sore throat Cvc:  No dizziness, chest pain or heaviness Resp:   Hemoptysis is resolved Gi: Denies swallowing difficulty, stomach pain, nausea or vomiting, diarrhea, constipation, bowel incontinence Gu:  Denies bladder incontinence, burning urine Ext:   No Joint pain, stiffness or swelling Skin: No skin rash, easy bruising or bleeding or hives Endoc:  No polyuria, polydipsia , polyphagia or weight change Psych: No depression, insomnia or hallucinations  Other:  All other systems negative  Physical Examination:   VS: BP (!) 154/79 (BP Location: Left Arm)   Pulse 70   Temp 98.4 F (36.9 C) (Oral)   Resp 18   Ht 5\' 10"  (1.778 m)   Wt 238 lb 3.2 oz (108 kg)   SpO2 93%   BMI 34.18 kg/m   General Appearance: No distress  Neuro/psych: without focal findings, mental status, speech normal, alert and oriented, cranial nerves 2-12 intact, reflexes normal and symmetric, sensation grossly normal  NECK: Supple, mo jvd, no stridor HEENT: PERRLA, EOM intact, no ptosis, no other lesions noticed Pulmonary:.No wheezing, No rales     Cardiovascular:  Normal S1,S2.  No m/r/g.   Abdomen:Benign, Soft, non-tender, No masses, hepatosplenomegaly, No lymphadenopathy Endoc: No evident thyromegaly, no signs of acromegaly or Cushing features Skin:   warm, no rashes, no ecchymosis  Extremities: normal, no cyanosis, clubbing, no edema, warm with normal capillary refill. Other findings:   Labs results:   Recent Labs     06/17/17  0104  06/17/17  0538  HGB  8.0*  8.4*  HCT   26.3*  27.5*  MCV  71.7*  71.5*  WBC  6.8  6.4  BUN  25*  23*  CREATININE  1.04  1.06  GLUCOSE  170*  158*  CALCIUM  8.3*  8.6*  INR  4.01*  3.85  ,      Rad  results:   Ct Chest Wo Contrast  Result Date: 06/17/2017 CLINICAL DATA:  Inpatient. Weakness. Hemoptysis. Dyspnea. History of biopsy proven right middle lobe carcinoid. EXAM: CT CHEST WITHOUT CONTRAST TECHNIQUE: Multidetector CT imaging of the chest was performed following the standard protocol without IV contrast. COMPARISON:  Chest radiograph from earlier today. 10/15/2016 chest CT. FINDINGS: Cardiovascular: Top-normal heart size. No significant pericardial fluid/thickening. Left main, left anterior descending, left circumflex and right coronary atherosclerosis. Thoracic aortic atherosclerosis with 4.4 cm ascending thoracic aortic aneurysm, stable. Dilated main pulmonary artery (3.9 cm diameter), slightly increased from 3.7 cm. Mediastinum/Nodes: No discrete thyroid nodules. Unremarkable esophagus. No pathologically enlarged axillary, mediastinal or gross hilar lymph nodes, noting limited sensitivity for the detection of hilar adenopathy on this noncontrast study. Lungs/Pleura: No pneumothorax. No pleural effusion. Central right lower 5.0 x 4.2 cm lung mass (series 2/ image 89) demonstrates heterogeneous internal calcifications, previously 5.0 x 4.0 cm on 10/15/2016 using similar measurement technique, not appreciably changed in size. Associated occlusion of the right middle lobe bronchus with complete right middle lobe collapse, unchanged. Worsened mass-effect on the right lower lobe bronchus, which now appears occluded. New patchy tree-in-bud opacities and mucoid impaction throughout the right lower lobe. Stable irregular parenchymal bands of the right lung apex, lingula and left lower lobe, compatible with mild postinfectious/ postinflammatory scarring. Otherwise no significant pulmonary nodules. Mild centrilobular and paraseptal emphysema.  Upper abdomen: Moderate diffuse hepatic steatosis. Musculoskeletal: No aggressive appearing focal osseous lesions. Moderate thoracic spondylosis. IMPRESSION: 1. Stable size of 5.0 cm heterogeneously calcified central right lower lung mass, compatible with biopsy-proven carcinoid. 2. Stable occlusion of the right middle lobe bronchus with complete right middle lobe collapse. 3. Worsened mass-effect on the right lower lobe bronchus, which is now occluded. Increased postobstructive pneumonitis/mucoid impaction throughout the right lower lobe. 4. Stable 4.4 cm ascending thoracic aortic aneurysm. Recommend annual imaging followup by CTA or MRA. This recommendation follows 2010 ACCF/AHA/AATS/ACR/ASA/SCA/SCAI/SIR/STS/SVM Guidelines for the Diagnosis and Management of Patients with Thoracic Aortic Disease. Circulation. 2010; 121: Q008-Q761. 5. Dilated main pulmonary artery, slightly increased, suggesting pulmonary arterial hypertension. 6. Left main and 3 vessel coronary atherosclerosis. 7. Moderate diffuse hepatic steatosis. Aortic Atherosclerosis (ICD10-I70.0) and Emphysema (ICD10-J43.9). Aortic aneurysm NOS (ICD10-I71.9). Electronically Signed   By: Ilona Sorrel M.D.   On: 06/17/2017 09:20   Dg Chest Port 1 View  Result Date: 06/17/2017 CLINICAL DATA:  Shortness of breath. Right-sided chest pain. Hemoptysis. EXAM: PORTABLE CHEST 1 VIEW COMPARISON:  CT 10/15/2016 FINDINGS: Chronic volume loss in the right hemithorax. Known calcified right infrahilar mass is not well seen radiographically. Heart appears prominent size likely accentuated by AP technique, mediastinal contours are unchanged. Right lower lobe atelectasis with possible increase. No pleural effusion or pneumothorax. No acute osseous abnormalities. IMPRESSION: Chronic volume loss in the right hemithorax secondary to known partially calcified right infrahilar mass. Probable increase in right basilar atelectasis. Electronically Signed   By: Jeb Levering  M.D.   On: 06/17/2017 01:43     Assessment and Plan: Came in with hemoptysis, now resolved after correcting his coagulapathy (on coumidin, inc INR) . The mass noted been there over six years. He refused biopsy in  [place]. He did see T - surg. Dr Genevive Bi assessment is noted below:    :   I reviewed with the patient and his wife the options. He would need at least a bilobectomy for surgical resection. Given his underlying lung disease I do not believe that would be possible. I also discussed  with him the possibility of bronchoscopy with stent placement. He causes would involve sedation or general anesthesia he has refused. I have little else to offer him at this time. I did suggest that some baseline pulmonary function studies might be beneficial. I did not make a return visit for him.  So far no change in size  Hence will not go ahead with bronch He can eat regular meal Following inr, adjusting coumidin dose ( tried eliquis but was to expensive for them) anticipate d/c  Tomorrow with out patient follow In regards to his copd,  will stay on pre admit regimen   I have personally obtained a history, examined the patient, evaluated laboratory and imaging results, formulated the assessment and plan and placed orders.  The Patient requires high complexity decision making for assessment and support, frequent evaluation and titration of therapies, application of advanced monitoring technologies and extensive interpretation of multiple databases.   Herbon Fleming,M.D. Pulmonary & Critical care Medicine [facility]

## 2017-06-17 NOTE — Plan of Care (Signed)
Problem: Pain Managment: Goal: General experience of comfort will improve Outcome: Not Progressing Pt complains of right lung pain, MD paged for PRN pain medication orders.  Problem: Tissue Perfusion: Goal: Risk factors for ineffective tissue perfusion will decrease Outcome: Progressing Coumadin on hold due to INR& PT being elevated, pt refuses SCDs.  Problem: Activity: Goal: Risk for activity intolerance will decrease Outcome: Progressing Up OOB in room with standby assist. Tolerated fairly. Does get SOB with exertion   Problem: Nutrition: Goal: Adequate nutrition will be maintained Outcome: Not Progressing Pt currently NPO

## 2017-06-17 NOTE — ED Triage Notes (Signed)
Patient c/o hemoptysis with large clots beginning today. Pt c/o right-sided chest pain, SOB, and weakness.

## 2017-06-18 DIAGNOSIS — D6832 Hemorrhagic disorder due to extrinsic circulating anticoagulants: Secondary | ICD-10-CM | POA: Diagnosis not present

## 2017-06-18 DIAGNOSIS — J441 Chronic obstructive pulmonary disease with (acute) exacerbation: Secondary | ICD-10-CM | POA: Diagnosis not present

## 2017-06-18 DIAGNOSIS — R042 Hemoptysis: Secondary | ICD-10-CM | POA: Diagnosis not present

## 2017-06-18 DIAGNOSIS — Z23 Encounter for immunization: Secondary | ICD-10-CM | POA: Diagnosis not present

## 2017-06-18 DIAGNOSIS — J9611 Chronic respiratory failure with hypoxia: Secondary | ICD-10-CM | POA: Diagnosis not present

## 2017-06-18 DIAGNOSIS — I10 Essential (primary) hypertension: Secondary | ICD-10-CM | POA: Diagnosis not present

## 2017-06-18 DIAGNOSIS — R748 Abnormal levels of other serum enzymes: Secondary | ICD-10-CM | POA: Diagnosis not present

## 2017-06-18 DIAGNOSIS — Y92009 Unspecified place in unspecified non-institutional (private) residence as the place of occurrence of the external cause: Secondary | ICD-10-CM | POA: Diagnosis not present

## 2017-06-18 DIAGNOSIS — I248 Other forms of acute ischemic heart disease: Secondary | ICD-10-CM | POA: Diagnosis not present

## 2017-06-18 LAB — PROTIME-INR
INR: 1.35
Prothrombin Time: 16.8 seconds — ABNORMAL HIGH (ref 11.4–15.2)

## 2017-06-18 LAB — HIV ANTIBODY (ROUTINE TESTING W REFLEX): HIV Screen 4th Generation wRfx: NONREACTIVE

## 2017-06-18 MED ORDER — WARFARIN SODIUM 3 MG PO TABS: 3.0000 mg | ORAL_TABLET | Freq: Every day | ORAL | 0 refills | Status: DC

## 2017-06-18 MED ORDER — PREDNISONE 10 MG (21) PO TBPK: ORAL_TABLET | ORAL | 0 refills | Status: DC

## 2017-06-18 NOTE — Care Management Important Message (Signed)
Important Message  Patient Details  Name: Marvin Lewis MRN: [id number] Date of Birth: 1951/01/10   Medicare Important Message Given:  N/A - LOS <3 / Initial given by admissions    Katrina Stack, RN 06/18/2017, 12:09 PM

## 2017-06-18 NOTE — Progress Notes (Signed)
Patient discharged home as ordered,instructions explained and well understood,follow up appointment made,vital signs within normal limits,escorted by spouse and staff member via wheel chair

## 2017-06-18 NOTE — Care Management (Signed)
CM screen due to concerns reported by staff during progression.   patient admitted with hemoptysis and found to have elevated  INR.  he has a known lung mass of 6 years .  Is on chronic coumadin.  Patient declines need for any services at discharge.  Dr Ubaldo Glassing follows his protimes.  No discharge needs identified by members of the care team or patient

## 2017-06-19 ENCOUNTER — Encounter: Payer: Self-pay | Admitting: Family Medicine

## 2017-06-19 DIAGNOSIS — I719 Aortic aneurysm of unspecified site, without rupture: Secondary | ICD-10-CM

## 2017-06-19 HISTORY — DX: Aortic aneurysm of unspecified site, without rupture: I71.9

## 2017-06-19 NOTE — Discharge Summary (Addendum)
Marvin Lewis, is a 66 y.o. male  DOB 16-Nov-1951  MRN [id number].  Admission date:  06/17/2017  Admitting Physician  Saundra Shelling, MD  Discharge Date:  06/18/2017   Primary MD  Arnetha Courser, MD  Recommendations for primary care physician for things to follow:   Follow-up with PCP in 1 week   Admission Diagnosis  SOB (shortness of breath) [R06.02] Hypoxia [R09.02] Elevated troponin [R74.8] Hemoptysis [R04.2]   Discharge Diagnosis  SOB (shortness of breath) [R06.02] Hypoxia [R09.02] Elevated troponin [R74.8] Hemoptysis [R04.2]    Active Problems:   Hemoptysis      Past Medical History:  Diagnosis Date  . A-fib ([place])   . Alcohol abuse   . Alcohol use   . Aortic aneurysm, intrathoracic ([place])   . Ascending aortic aneurysm ([place])   . Ascending aortic aneurysm ([place])   . Atrial fibrillation ([place]) 05/08/2016  . Broken rib April 2016   History of broken ribs x 3  . Bronchitis   . CAD (coronary artery disease)   . CHF (congestive heart failure) ([place])   . Chronic alcoholism ([place])   . Cirrhosis ([place])   . Closed nondisplaced fracture of fifth metatarsal bone of left foot Nov. 2016   left  . COPD (chronic obstructive pulmonary disease) ([place])   . Depression   . Diabetes mellitus without complication ([place])   . Emphysema of lung ([place])   . Esophageal rupture   . GERD (gastroesophageal reflux disease)   . Hepatitis C   . Hip arthritis   . History of diverticulitis    ruptured and sepsis  . History of hiatal hernia   . History of smoking   . HOH (hard of hearing)   . Hyperlipidemia   . Hypertension   . Hypoxemia   . Insomnia   . Movement disorder   . Onychogryphosis   . Pneumonia   . Rib fracture   . Seasonal allergies   . Shortness of breath dyspnea   . Sleep apnea    uses O2 @2  L/min at night  .  Stroke ([place])   . Thrombocytopenia ([place])     Past Surgical History:  Procedure Laterality Date  . CATARACT EXTRACTION W/PHACO Right 01/16/2016   Procedure: CATARACT EXTRACTION PHACO AND INTRAOCULAR LENS PLACEMENT (IOC);  Surgeon: Birder Robson, MD;  Location: [facility] ORS;  Service: Ophthalmology;  Laterality: Right;  [place]: 00:45.0 AP%: 25.2 CDE: 11.36  Lot #[id number]  . CHOLECYSTECTOMY    . COLON RESECTION    . COLONOSCOPY WITH PROPOFOL N/A 08/27/2016   Procedure: COLONOSCOPY WITH PROPOFOL;  Surgeon: Lollie Sails, MD;  Location[facility] ENDOSCOPY;  Service: Endoscopy;  Laterality: N/A;  . ELECTROPHYSIOLOGIC STUDY N/A 05/22/2016   Procedure: CARDIOVERSION;  Surgeon: Teodoro Spray, MD;  Location: [facility] ORS;  Service: Cardiovascular;  Laterality: N/A;  . HERNIA REPAIR    . PEG PLACEMENT N/A   . PEG TUBE REMOVAL N/A   . PILONIDAL CYST EXCISION N/A   . TRACHEOSTOMY N/A        History of present illness and  Hospital Course:     Kindly see H&P for history of present illness and admission details, please review complete Labs, Consult reports and Test reports for all details in brief  HPI  from the history and physical done on the day of admission 66 year old male patient with history of atrial fibrillation on warfarin, history of CAD, CHF, liver cirrhosis, COPD, hep C, hypertension, hyperlipidemia, sleep apnea came in because  of coughing up blood about large amount. Patient INR more than for an outpatient. He he also had some wheezing, shortness of breath. Admitted to hospitalist service for hemoptysis, COPD exacerbation. Because of his  Cardiac  history history is admitted to telemetry.   Hospital Course   #1 hemoptysis secondary to supratherapeutic INR: ,INR 4 on admission. INR came down to 1.35 on July 11. Patient received vitamin K 10 Milligrams subcutaneous. Seen by  pulmonary Dr. Raul Del. CT chest is done because of hemoptysis, respiratory chest showed stable calcified central right  lower lung mass with biopsy-proven, carcinoid. .CT chest results discussed with pt by DR.Raul Del., hemoglobin stable at 8.4 did not require any transfusion.; Follow up with them as an outpatient for outpatient PFTs.  continue Albuterol nebulizers at home.  2. COPD exacerbation: Received IV steroids, patient wheezing improved, discharged home with tapering course of steroids, advised to use his oxygen that use at home all the time.  #3 movement disorder #4 insomnia #5. Chronic respiratory failure: Use 2 L of oxygen all the time #6 diabetes mellitus type 2 without complications; resume home medications at the time of discharge . #7 hepatitis C,  7 chronic atrial fibrillation: Elevated INR on admission, INR dropped to less than 2 on discharge, decreased the dose of Coumadin to 3 MG daily and follow-up with Dr. Ubaldo Glassing for close monitoring of PT/INR.  continue sotalol for rate control.wife told me that they follow with DR.Fath. 8.HTN;continue home meds.  D/w wife about all discharge plan ,RN witnessed this.  Discharge Condition:stable   Follow UP  Follow-up Information    Lada, Satira Anis, MD Follow up in 1 week(s).   Specialty:  Family Medicine Contact information: [address] [place] [address] [phone]        Teodoro Spray, MD. Schedule an appointment as soon as possible for a visit in 2 day(s).   Specialty:  Cardiology Contact information: [place] [address] [phone]             Discharge Instructions  and  Discharge Medications     Allergies as of 06/18/2017   No Known Allergies     Medication List    STOP taking these medications   ELIQUIS 5 MG Tabs tablet Generic drug:  apixaban     TAKE these medications   albuterol (2.5 MG/3ML) 0.083% nebulizer solution Commonly known as:  PROVENTIL Take 2.5 mg by nebulization every 6 (six) hours as needed for wheezing or shortness of breath.   amLODipine 5 MG tablet Commonly  known as:  NORVASC Take 5 mg by mouth daily. What changed:  Another medication with the same name was removed. Continue taking this medication, and follow the directions you see here.   atorvastatin 10 MG tablet Commonly known as:  LIPITOR Take 1 tablet (10 mg total) by mouth at bedtime.   B-D INS SYRINGE 0.5CC/30GX1/2" 30G X 1/2" 0.5 ML Misc Generic drug:  Insulin Syringe-Needle U-100 AS DIRECTED.   B-D INS SYRINGE 0.5CC/31GX5/16 31G X 5/16" 0.5 ML Misc Generic drug:  Insulin Syringe-Needle U-100 AS DIRECTED.   benazepril 10 MG tablet Commonly known as:  LOTENSIN Take 10 mg by mouth 2 (two) times daily. What changed:  Another medication with the same name was removed. Continue taking this medication, and follow the directions you see here.   furosemide 20 MG tablet Commonly known as:  LASIX Take 20 mg by mouth.   insulin aspart 100 UNIT/ML FlexPen Commonly known  as:  NOVOLOG Sliding scale Sub-Q for FSBS 200-249 give 3 units, 250-299 give 5 units, 300-349 give 7 units, 350-399 give 9 units, 400+ give 12 units   insulin glargine 100 UNIT/ML injection Commonly known as:  LANTUS Inject 0.55 mLs (55 Units total) into the skin at bedtime. Or as directed   metFORMIN 500 MG 24 hr tablet Commonly known as:  GLUCOPHAGE-XR Take 1,000 mg by mouth daily.   omeprazole 20 MG capsule Commonly known as:  PRILOSEC Take 1 capsule (20 mg total) by mouth daily. Caution:prolonged use may increase risk of pneumonia, colitis, osteoporosis, anemia   predniSONE 10 MG (21) Tbpk tablet Commonly known as:  STERAPRED UNI-PAK 21 TAB Taper by  10 mg daily   sotalol 80 MG tablet Commonly known as:  BETAPACE Take 1 tablet (80 mg total) by mouth 2 (two) times daily.   TRESIBA FLEXTOUCH 200 UNIT/ML Sopn Generic drug:  Insulin Degludec Inject 46 Units into the skin daily.   warfarin 3 MG tablet Commonly known as:  COUMADIN Take 1 tablet (3 mg total) by mouth daily. What changed:  medication  strength  how much to take  when to take this         Diet and Activity recommendation: See Discharge Instructions above   Consults obtained - pulmonary Dr.Fleming  Major procedures and Radiology Reports - PLEASE review detailed and final reports for all details, in brief -      Ct Chest Wo Contrast  Result Date: 06/17/2017 CLINICAL DATA:  Inpatient. Weakness. Hemoptysis. Dyspnea. History of biopsy proven right middle lobe carcinoid. EXAM: CT CHEST WITHOUT CONTRAST TECHNIQUE: Multidetector CT imaging of the chest was performed following the standard protocol without IV contrast. COMPARISON:  Chest radiograph from earlier today. 10/15/2016 chest CT. FINDINGS: Cardiovascular: Top-normal heart size. No significant pericardial fluid/thickening. Left main, left anterior descending, left circumflex and right coronary atherosclerosis. Thoracic aortic atherosclerosis with 4.4 cm ascending thoracic aortic aneurysm, stable. Dilated main pulmonary artery (3.9 cm diameter), slightly increased from 3.7 cm. Mediastinum/Nodes: No discrete thyroid nodules. Unremarkable esophagus. No pathologically enlarged axillary, mediastinal or gross hilar lymph nodes, noting limited sensitivity for the detection of hilar adenopathy on this noncontrast study. Lungs/Pleura: No pneumothorax. No pleural effusion. Central right lower 5.0 x 4.2 cm lung mass (series 2/ image 89) demonstrates heterogeneous internal calcifications, previously 5.0 x 4.0 cm on 10/15/2016 using similar measurement technique, not appreciably changed in size. Associated occlusion of the right middle lobe bronchus with complete right middle lobe collapse, unchanged. Worsened mass-effect on the right lower lobe bronchus, which now appears occluded. New patchy tree-in-bud opacities and mucoid impaction throughout the right lower lobe. Stable irregular parenchymal bands of the right lung apex, lingula and left lower lobe, compatible with mild  postinfectious/ postinflammatory scarring. Otherwise no significant pulmonary nodules. Mild centrilobular and paraseptal emphysema. Upper abdomen: Moderate diffuse hepatic steatosis. Musculoskeletal: No aggressive appearing focal osseous lesions. Moderate thoracic spondylosis. IMPRESSION: 1. Stable size of 5.0 cm heterogeneously calcified central right lower lung mass, compatible with biopsy-proven carcinoid. 2. Stable occlusion of the right middle lobe bronchus with complete right middle lobe collapse. 3. Worsened mass-effect on the right lower lobe bronchus, which is now occluded. Increased postobstructive pneumonitis/mucoid impaction throughout the right lower lobe. 4. Stable 4.4 cm ascending thoracic aortic aneurysm. Recommend annual imaging followup by CTA or MRA. This recommendation follows 2010 ACCF/AHA/AATS/ACR/ASA/SCA/SCAI/SIR/STS/SVM Guidelines for the Diagnosis and Management of Patients with Thoracic Aortic Disease. Circulation. 2010; 121: W098-J191. 5. Dilated main pulmonary artery, slightly  increased, suggesting pulmonary arterial hypertension. 6. Left main and 3 vessel coronary atherosclerosis. 7. Moderate diffuse hepatic steatosis. Aortic Atherosclerosis (ICD10-I70.0) and Emphysema (ICD10-J43.9). Aortic aneurysm NOS (ICD10-I71.9). Electronically Signed   By: Ilona Sorrel M.D.   On: 06/17/2017 09:20   Dg Chest Port 1 View  Result Date: 06/17/2017 CLINICAL DATA:  Shortness of breath. Right-sided chest pain. Hemoptysis. EXAM: PORTABLE CHEST 1 VIEW COMPARISON:  CT 10/15/2016 FINDINGS: Chronic volume loss in the right hemithorax. Known calcified right infrahilar mass is not well seen radiographically. Heart appears prominent size likely accentuated by AP technique, mediastinal contours are unchanged. Right lower lobe atelectasis with possible increase. No pleural effusion or pneumothorax. No acute osseous abnormalities. IMPRESSION: Chronic volume loss in the right hemithorax secondary to known  partially calcified right infrahilar mass. Probable increase in right basilar atelectasis. Electronically Signed   By: Jeb Levering M.D.   On: 06/17/2017 01:43    Micro Results     No results found for this or any previous visit (from the past 240 hour(s)).     Today   Subjective:   Marvin Lewis today has  noFurther hemoptysis, no shortness of breath. Stable for discharge.  Objective:   Blood pressure (!) 152/67, pulse 61, temperature 98.1 F (36.7 C), temperature source Oral, resp. rate 18, height 5\' 10"  (1.778 m), weight 108 kg (238 lb 3.2 oz), SpO2 92 %.  No intake or output data in the 24 hours ending 06/19/17 1635  Exam Awake Alert, Oriented x 3, No new F.N deficits, Normal affect [place].AT,PERRAL Supple Neck,No JVD, No cervical lymphadenopathy appriciated.  Symmetrical Chest wall movement, Good air movement bilaterally, CTAB RRR,No Gallops,Rubs or new Murmurs, No Parasternal Heave +ve B.Sounds, Abd Soft, Non tender, No organomegaly appriciated, No rebound -guarding or rigidity. No Cyanosis, Clubbing or edema, No new Rash or bruise  Data Review   CBC w Diff:  Lab Results  Component Value Date   WBC 6.4 06/17/2017   HGB 8.4 (L) 06/17/2017   HGB 11.2 (L) 04/23/2016   HCT 27.5 (L) 06/17/2017   HCT 37.3 (L) 04/23/2016   PLT 197 06/17/2017   PLT 140 (L) 04/23/2016   LYMPHOPCT 14 08/27/2016   LYMPHOPCT 11.3 01/01/2014   MONOPCT 9 08/27/2016   MONOPCT 7.6 01/01/2014   EOSPCT 6 08/27/2016   EOSPCT 4.8 01/01/2014   BASOPCT 0 08/27/2016   BASOPCT 1.5 01/01/2014    CMP:  Lab Results  Component Value Date   NA 134 (L) 06/17/2017   NA 142 04/23/2016   NA 139 01/03/2014   K 4.7 06/17/2017   K 3.6 01/03/2014   CL 99 (L) 06/17/2017   CL 110 (H) 01/03/2014   CO2 27 06/17/2017   CO2 22 01/03/2014   BUN 23 (H) 06/17/2017   BUN 25 04/23/2016   BUN 9 01/03/2014   CREATININE 1.06 06/17/2017   CREATININE 0.85 01/03/2014   PROT 7.7 05/08/2016   PROT 7.0  04/23/2016   PROT 7.8 12/30/2013   ALBUMIN 4.1 05/08/2016   ALBUMIN 4.1 04/23/2016   ALBUMIN 3.6 12/30/2013   BILITOT 0.8 05/08/2016   BILITOT 0.5 04/23/2016   BILITOT 0.6 12/30/2013   ALKPHOS 94 05/08/2016   ALKPHOS 118 (H) 12/30/2013   AST 50 (H) 05/08/2016   AST 49 (H) 12/30/2013   ALT 42 05/08/2016   ALT 49 12/30/2013  .   Total Time in preparing paper work, data evaluation and todays exam - 55 minutes  Marvin Lewis M.D on 06/18/2017 at 4:35 PM  Note: This dictation was prepared with Dragon dictation along with smaller phrase technology. Any transcriptional errors that result from this process are unintentional.

## 2017-06-25 DIAGNOSIS — Z720 Tobacco use: Secondary | ICD-10-CM | POA: Diagnosis not present

## 2017-06-25 DIAGNOSIS — I10 Essential (primary) hypertension: Secondary | ICD-10-CM | POA: Diagnosis not present

## 2017-06-25 DIAGNOSIS — I481 Persistent atrial fibrillation: Secondary | ICD-10-CM | POA: Diagnosis not present

## 2017-06-25 DIAGNOSIS — D62 Acute posthemorrhagic anemia: Secondary | ICD-10-CM | POA: Diagnosis not present

## 2017-06-25 DIAGNOSIS — J431 Panlobular emphysema: Secondary | ICD-10-CM | POA: Diagnosis not present

## 2017-07-09 ENCOUNTER — Emergency Department
Admission: EM | Admit: 2017-07-09 | Discharge: 2017-07-09 | Disposition: A | Discharge status: Home / Self Care | Payer: Medicare Other | Attending: Emergency Medicine | Admitting: Emergency Medicine

## 2017-07-09 ENCOUNTER — Encounter: Payer: Self-pay | Admitting: Emergency Medicine

## 2017-07-09 ENCOUNTER — Emergency Department: Payer: Medicare Other

## 2017-07-09 DIAGNOSIS — F17210 Nicotine dependence, cigarettes, uncomplicated: Secondary | ICD-10-CM | POA: Diagnosis not present

## 2017-07-09 DIAGNOSIS — E119 Type 2 diabetes mellitus without complications: Secondary | ICD-10-CM | POA: Insufficient documentation

## 2017-07-09 DIAGNOSIS — Z79899 Other long term (current) drug therapy: Secondary | ICD-10-CM | POA: Diagnosis not present

## 2017-07-09 DIAGNOSIS — I2510 Atherosclerotic heart disease of native coronary artery without angina pectoris: Secondary | ICD-10-CM | POA: Diagnosis not present

## 2017-07-09 DIAGNOSIS — Y92002 Bathroom of unspecified non-institutional (private) residence single-family (private) house as the place of occurrence of the external cause: Secondary | ICD-10-CM | POA: Diagnosis not present

## 2017-07-09 DIAGNOSIS — S52122A Displaced fracture of head of left radius, initial encounter for closed fracture: Secondary | ICD-10-CM | POA: Diagnosis not present

## 2017-07-09 DIAGNOSIS — I110 Hypertensive heart disease with heart failure: Secondary | ICD-10-CM | POA: Diagnosis not present

## 2017-07-09 DIAGNOSIS — Z794 Long term (current) use of insulin: Secondary | ICD-10-CM | POA: Insufficient documentation

## 2017-07-09 DIAGNOSIS — S52592A Other fractures of lower end of left radius, initial encounter for closed fracture: Secondary | ICD-10-CM | POA: Diagnosis not present

## 2017-07-09 DIAGNOSIS — S62102A Fracture of unspecified carpal bone, left wrist, initial encounter for closed fracture: Secondary | ICD-10-CM | POA: Insufficient documentation

## 2017-07-09 DIAGNOSIS — S59912A Unspecified injury of left forearm, initial encounter: Secondary | ICD-10-CM | POA: Diagnosis present

## 2017-07-09 DIAGNOSIS — I10 Essential (primary) hypertension: Secondary | ICD-10-CM | POA: Insufficient documentation

## 2017-07-09 DIAGNOSIS — Y9389 Activity, other specified: Secondary | ICD-10-CM | POA: Insufficient documentation

## 2017-07-09 DIAGNOSIS — S62357A Nondisplaced fracture of shaft of fifth metacarpal bone, left hand, initial encounter for closed fracture: Secondary | ICD-10-CM | POA: Insufficient documentation

## 2017-07-09 DIAGNOSIS — Y999 Unspecified external cause status: Secondary | ICD-10-CM | POA: Insufficient documentation

## 2017-07-09 DIAGNOSIS — J449 Chronic obstructive pulmonary disease, unspecified: Secondary | ICD-10-CM | POA: Insufficient documentation

## 2017-07-09 DIAGNOSIS — I509 Heart failure, unspecified: Secondary | ICD-10-CM | POA: Diagnosis not present

## 2017-07-09 DIAGNOSIS — Z7984 Long term (current) use of oral hypoglycemic drugs: Secondary | ICD-10-CM | POA: Insufficient documentation

## 2017-07-09 DIAGNOSIS — W1811XA Fall from or off toilet without subsequent striking against object, initial encounter: Secondary | ICD-10-CM | POA: Diagnosis not present

## 2017-07-09 DIAGNOSIS — S52692A Other fracture of lower end of left ulna, initial encounter for closed fracture: Secondary | ICD-10-CM | POA: Diagnosis not present

## 2017-07-09 MED ORDER — OXYCODONE-ACETAMINOPHEN 5-325 MG PO TABS
1.0000 | ORAL_TABLET | Freq: Once | ORAL | Status: AC
  Administered 2017-07-09: 1 via ORAL
  Filled 2017-07-09: qty 1

## 2017-07-09 MED ORDER — OXYCODONE-ACETAMINOPHEN 5-325 MG PO TABS: 1.0000 | ORAL_TABLET | Freq: Four times a day (QID) | ORAL | 0 refills | Status: DC | PRN

## 2017-07-09 NOTE — Discharge Instructions (Signed)
You should keep the splint in place until you are seen by Dr. Sabra Heck tomorrow afternoon. Keep the arm elevated and apply ice packs over the splint to reduce swelling. Take the pain medicine as needed.

## 2017-07-09 NOTE — ED Provider Notes (Signed)
[facility] Emergency Department Provider Note ____________________________________________  Time seen: 1534  I have reviewed the triage vital signs and the nursing notes.  HISTORY  Chief Complaint  Arm Injury   HPI AUTHOR HATLESTAD is a 66 y.o.right-handed male presents to the ED, 2 hours after a mechanical fall at home. She describes him sitting on the toilet for prolonged period of time and his legs got numb and weak on him. He attempted to stand, and when he did so, he fell breaking his fall by putting his left arm out across the bathtub. He presents now with pain, swelling, and deformity to the left wrist. Patient has a history of right wrist fracture as a teenager. He also is status post a left total hip arthroplasty. His medical history includes COPD, type 2 diabetes, hypertension, coronary artery disease, and A. Fib.  Past Medical History:  Diagnosis Date  . A-fib ([place])   . Alcohol abuse   . Alcohol use   . Aortic aneurysm ([place]) 06/19/2017   Ascending 4.4 cm, CT scan July 2018  . Aortic aneurysm, intrathoracic ([place])   . Ascending aortic aneurysm ([place])   . Ascending aortic aneurysm ([place])   . Atrial fibrillation ([place]) 05/08/2016  . Broken rib April 2016   History of broken ribs x 3  . Bronchitis   . CAD (coronary artery disease)   . CHF (congestive heart failure) ([place])   . Chronic alcoholism ([place])   . Cirrhosis ([place])   . Closed nondisplaced fracture of fifth metatarsal bone of left foot Nov. 2016   left  . COPD (chronic obstructive pulmonary disease) ([place])   . Depression   . Diabetes mellitus without complication ([place])   . Emphysema of lung ([place])   . Esophageal rupture   . GERD (gastroesophageal reflux disease)   . Hepatitis C   . Hip arthritis   . History of diverticulitis    ruptured and sepsis  . History of hiatal hernia   . History of smoking   . HOH (hard of hearing)   . Hyperlipidemia   . Hypertension   . Hypoxemia   . Insomnia    . Movement disorder   . Onychogryphosis   . Pneumonia   . Rib fracture   . Seasonal allergies   . Shortness of breath dyspnea   . Sleep apnea    uses O2 @2  L/min at night  . Stroke ([place])   . Thrombocytopenia [facility])     Patient Active Problem List   Diagnosis Date Noted  . Aortic aneurysm ([place]) 06/19/2017  . Hemoptysis 06/17/2017  . A-fib ([place]) 05/21/2016  . Leg cramps 05/08/2016  . Anemia 05/08/2016  . Atrial fibrillation ([place]) 05/08/2016  . Abnormal weight gain 04/22/2016  . Medication monitoring encounter 04/22/2016  . Elevated brain natriuretic peptide (BNP) level 04/22/2016  . Persistent atrial fibrillation ([place]) 03/29/2016  . Breathlessness on exertion 03/27/2016  . Gout 02/23/2016  . Hyperkalemia 01/12/2016  . Onychomycosis 01/10/2016  . Hoarseness of voice 01/10/2016  . Tobacco abuse 01/10/2016  . Hx of completed stroke 01/10/2016  . Athletes foot 08/09/2015  . Chronic alcoholism ([place])   . Movement disorder   . COPD (chronic obstructive pulmonary disease) ([place])   . Emphysema of lung ([place])   . Type II diabetes mellitus, uncontrolled ([place])   . Hyperlipidemia   . Hypertension   . CAD (coronary artery disease)   . Hepatitis C   . Cirrhosis ([place])   . Alcohol abuse   .  Onychogryphosis   . Insomnia   . Hip arthritis   . Hypoxemia   . 1st degree AV block   . Controlled type 2 diabetes mellitus without complication ([place]) 67/89/3810    Past Surgical History:  Procedure Laterality Date  . CATARACT EXTRACTION W/PHACO Right 01/16/2016   Procedure: CATARACT EXTRACTION PHACO AND INTRAOCULAR LENS PLACEMENT (IOC);  Surgeon: Birder Robson, MD;  Location: [facility] ORS;  Service: Ophthalmology;  Laterality: Right;  [place]: 00:45.0 AP%: 25.2 CDE: 11.36  Lot #[id number]  . CHOLECYSTECTOMY    . COLON RESECTION    . COLONOSCOPY WITH PROPOFOL N/A 08/27/2016   Procedure: COLONOSCOPY WITH PROPOFOL;  Surgeon: Lollie Sails, MD;  Location: [facility] ENDOSCOPY;  Service: Endoscopy;   Laterality: N/A;  . ELECTROPHYSIOLOGIC STUDY N/A 05/22/2016   Procedure: CARDIOVERSION;  Surgeon: Teodoro Spray, MD;  Location: [facility] ORS;  Service: Cardiovascular;  Laterality: N/A;  . HERNIA REPAIR    . PEG PLACEMENT N/A   . PEG TUBE REMOVAL N/A   . PILONIDAL CYST EXCISION N/A   . TRACHEOSTOMY N/A     Prior to Admission medications   Medication Sig Start Date End Date Taking? Authorizing Provider  albuterol (PROVENTIL) (2.5 MG/3ML) 0.083% nebulizer solution Take 2.5 mg by nebulization every 6 (six) hours as needed for wheezing or shortness of breath.    [provider]  amLODipine (NORVASC) 5 MG tablet Take 5 mg by mouth daily. 06/13/17   [provider]  atorvastatin (LIPITOR) 10 MG tablet Take 1 tablet (10 mg total) by mouth at bedtime. 11/09/16   Arnetha Courser, MD  B-D INS SYRINGE 0.5CC/30GX1/2" 30G X 1/2" 0.5 ML MISC AS DIRECTED. 07/17/15   Guadalupe Maple, MD  B-D INS SYRINGE 0.5CC/31GX5/16 31G X 5/16" 0.5 ML MISC AS DIRECTED. 01/02/17   Johnson, Megan P, DO  benazepril (LOTENSIN) 10 MG tablet Take 10 mg by mouth 2 (two) times daily. 06/13/17   [provider]  furosemide (LASIX) 20 MG tablet Take 20 mg by mouth.    [provider]  insulin aspart (NOVOLOG) 100 UNIT/ML FlexPen Sliding scale Sub-Q for FSBS 200-249 give 3 units, 250-299 give 5 units, 300-349 give 7 units, 350-399 give 9 units, 400+ give 12 units 05/22/16   Lada, Satira Anis, MD  insulin glargine (LANTUS) 100 UNIT/ML injection Inject 0.55 mLs (55 Units total) into the skin at bedtime. Or as directed Patient not taking: Reported on 06/17/2017 05/08/16   Arnetha Courser, MD  metFORMIN (GLUCOPHAGE-XR) 500 MG 24 hr tablet Take 1,000 mg by mouth daily. 05/27/17   [provider]  omeprazole (PRILOSEC) 20 MG capsule Take 1 capsule (20 mg total) by mouth daily. Caution:prolonged use may increase risk of pneumonia, colitis, osteoporosis, anemia 05/15/16   Lada, Satira Anis, MD  oxyCODONE-acetaminophen  (ROXICET) 5-325 MG tablet Take 1 tablet by mouth every 6 (six) hours as needed for moderate pain or severe pain. 07/09/17   Mistey Hoffert, Dannielle Karvonen, PA-C  predniSONE (STERAPRED UNI-PAK 21 TAB) 10 MG (21) TBPK tablet Taper by  10 mg daily 06/18/17   Epifanio Lesches, MD  sotalol (BETAPACE) 80 MG tablet Take 1 tablet (80 mg total) by mouth 2 (two) times daily. 05/22/16   Teodoro Spray, MD  TRESIBA FLEXTOUCH 200 UNIT/ML SOPN Inject 46 Units into the skin daily.  06/13/17   [provider]  warfarin (COUMADIN) 3 MG tablet Take 1 tablet (3 mg total) by mouth daily. 06/18/17 06/18/18  Epifanio Lesches, MD  Allergies Patient has no known allergies.  Family History  Problem Relation Age of Onset  . Coronary artery disease Mother   . Hypertension Mother   . Heart disease Mother   . Lung disease Mother   . Coronary artery disease Father   . Hypertension Father   . Diabetes Father   . Heart disease Father   . Cancer Father        prostate  . Alcohol abuse Brother     Social History Social History  Substance Use Topics  . Smoking status: Current Every Day Smoker    Packs/day: 0.25    Years: 40.00    Types: Cigarettes  . Smokeless tobacco: Never Used  . Alcohol use 0.0 oz/week     Comment: patient states he no longer drinks liquor, states he drinks very little    Review of Systems  Constitutional: Negative for fever. Cardiovascular: Negative for chest pain. Respiratory: Negative for shortness of breath. Gastrointestinal: Negative for abdominal pain, vomiting and diarrhea. Musculoskeletal: Negative for back pain. Left wrist deformity & disability as above. Skin: Negative for rash. Neurological: Negative for headaches, focal weakness or numbness. ____________________________________________  PHYSICAL EXAM:  VITAL SIGNS: ED Triage Vitals  Enc Vitals Group     BP 07/09/17 1456 (!) 111/59     Pulse Rate 07/09/17 1456 86     Resp 07/09/17 1456 18     Temp 07/09/17  1456 98.6 F (37 C)     Temp Source 07/09/17 1456 Oral     SpO2 07/09/17 1456 93 %     Weight 07/09/17 1457 238 lb (108 kg)     Height 07/09/17 1457 5\' 10"  (1.778 m)     Head Circumference --      Peak Flow --      Pain Score 07/09/17 1456 8     Pain Loc --      Pain Edu? --      Excl. in [place]? --     Constitutional: Alert and oriented. Well appearing and in no distress. Head: Normocephalic and atraumatic. Cardiovascular: Normal rate, regular rhythm. Normal distal pulses. Respiratory: Normal respiratory effort. No wheezes/rales/rhonchi. Gastrointestinal: Soft and nontender. No distention. Musculoskeletal: Left wrist with obvious deformity and swelling distally. Patient with normal composite fist on exam. Decreased wrist extension, flexion, and supination range secondary to pain and deformity. Nontender with normal range of motion in all extremities.  Neurologic:  Normal gross sensation. Normal intrinsic and opposition testing. Normal speech and language. No gross focal neurologic deficits are appreciated. Skin:  Skin is warm, dry and intact. No rash noted. ____________________________________________   RADIOLOGY  Left Wrist  IMPRESSION: Posterior angulated and displaced distal left radial and ulnar fractures. Probable old ulnar styloid fracture.  Patient is also found to have an oblique fracture of the 5th MC  I, Anavi Branscum, Dannielle Karvonen, personally viewed and evaluated these images (plain radiographs) as part of my medical decision making, as well as reviewing the written report by the radiologist. ____________________________________________  PROCEDURES  Oxycodone 5-325 mg PO OCL Sugar tong  ____________________________________________  INITIAL IMPRESSION / ASSESSMENT AND PLAN / ED COURSE  ----------------------------------------- 3:57 PM on 07/09/2017 ----------------------------------------- S/w Dr. Sabra Heck: He will see the patient in the office tomorrow afternoon (1  pm) for closed reduction.   Patient with ED evaluation and initial fracture management of a close, angulated, distal radial and ulnar fractures. The patient is discharged with a prescription for oxycodone (#20)  for pain. He will follow-up with  Dr. Sabra Heck as planned. He is to keep the arm splinted, elevated, and iced. ____________________________________________  FINAL CLINICAL IMPRESSION(S) / ED DIAGNOSES  Final diagnoses:  Wrist fracture, closed, left, initial encounter  Closed nondisplaced fracture of shaft of fifth metacarpal bone of left hand, initial encounter     Melvenia Needles, PA-C 07/09/17 1614    Earleen Newport, MD 07/09/17 1700

## 2017-07-09 NOTE — ED Notes (Signed)
See triage note  States he sat too long and leg gave out when he tried to stand  [employer] on left wrist  Positive swelling with some deformity  Good pulses

## 2017-07-09 NOTE — ED Triage Notes (Signed)
Pt reports sitting on toilet for a long time and leg gave out on him when he stood up and fell. Pt reports trying to break his fall with left arm. Pt presents with swelling and mild deformity to left wrist.

## 2017-07-10 DIAGNOSIS — S52532A Colles' fracture of left radius, initial encounter for closed fracture: Secondary | ICD-10-CM | POA: Diagnosis not present

## 2017-07-15 DIAGNOSIS — S52532D Colles' fracture of left radius, subsequent encounter for closed fracture with routine healing: Secondary | ICD-10-CM | POA: Diagnosis not present

## 2017-07-29 DIAGNOSIS — S52532D Colles' fracture of left radius, subsequent encounter for closed fracture with routine healing: Secondary | ICD-10-CM | POA: Diagnosis not present

## 2017-08-05 DIAGNOSIS — J431 Panlobular emphysema: Secondary | ICD-10-CM | POA: Diagnosis not present

## 2017-08-05 DIAGNOSIS — I481 Persistent atrial fibrillation: Secondary | ICD-10-CM | POA: Diagnosis not present

## 2017-08-05 DIAGNOSIS — E119 Type 2 diabetes mellitus without complications: Secondary | ICD-10-CM | POA: Diagnosis not present

## 2017-08-05 DIAGNOSIS — I10 Essential (primary) hypertension: Secondary | ICD-10-CM | POA: Diagnosis not present

## 2017-08-14 DIAGNOSIS — S52532A Colles' fracture of left radius, initial encounter for closed fracture: Secondary | ICD-10-CM | POA: Diagnosis not present

## 2017-08-15 DIAGNOSIS — R918 Other nonspecific abnormal finding of lung field: Secondary | ICD-10-CM | POA: Diagnosis not present

## 2017-08-15 DIAGNOSIS — R05 Cough: Secondary | ICD-10-CM | POA: Diagnosis not present

## 2017-08-15 DIAGNOSIS — R0609 Other forms of dyspnea: Secondary | ICD-10-CM | POA: Diagnosis not present

## 2017-08-15 DIAGNOSIS — J431 Panlobular emphysema: Secondary | ICD-10-CM | POA: Diagnosis not present

## 2017-08-19 DIAGNOSIS — Z794 Long term (current) use of insulin: Secondary | ICD-10-CM | POA: Diagnosis not present

## 2017-08-19 DIAGNOSIS — E1165 Type 2 diabetes mellitus with hyperglycemia: Secondary | ICD-10-CM | POA: Diagnosis not present

## 2017-08-21 DIAGNOSIS — E1165 Type 2 diabetes mellitus with hyperglycemia: Secondary | ICD-10-CM | POA: Diagnosis not present

## 2017-08-21 DIAGNOSIS — Z794 Long term (current) use of insulin: Secondary | ICD-10-CM | POA: Diagnosis not present

## 2017-08-26 DIAGNOSIS — E1142 Type 2 diabetes mellitus with diabetic polyneuropathy: Secondary | ICD-10-CM | POA: Diagnosis not present

## 2017-08-26 DIAGNOSIS — Z720 Tobacco use: Secondary | ICD-10-CM | POA: Diagnosis not present

## 2017-08-26 DIAGNOSIS — E1159 Type 2 diabetes mellitus with other circulatory complications: Secondary | ICD-10-CM | POA: Diagnosis not present

## 2017-08-26 DIAGNOSIS — Z794 Long term (current) use of insulin: Secondary | ICD-10-CM | POA: Diagnosis not present

## 2017-08-26 DIAGNOSIS — E1169 Type 2 diabetes mellitus with other specified complication: Secondary | ICD-10-CM | POA: Diagnosis not present

## 2017-08-26 DIAGNOSIS — E785 Hyperlipidemia, unspecified: Secondary | ICD-10-CM | POA: Diagnosis not present

## 2017-08-26 DIAGNOSIS — I10 Essential (primary) hypertension: Secondary | ICD-10-CM | POA: Diagnosis not present

## 2017-09-03 ENCOUNTER — Encounter: Payer: Self-pay | Admitting: Emergency Medicine

## 2017-09-03 ENCOUNTER — Inpatient Hospital Stay
Admission: EM | Admit: 2017-09-03 | Discharge: 2017-09-05 | DRG: 291 | Disposition: A | Discharge status: Home / Self Care | Payer: Medicare Other | Attending: Internal Medicine | Admitting: Internal Medicine

## 2017-09-03 ENCOUNTER — Inpatient Hospital Stay
Admit: 2017-09-03 | Discharge: 2017-09-03 | Disposition: A | Discharge status: Home / Self Care | Payer: Medicare Other | Attending: Internal Medicine | Admitting: Internal Medicine

## 2017-09-03 ENCOUNTER — Emergency Department: Payer: Medicare Other

## 2017-09-03 DIAGNOSIS — I110 Hypertensive heart disease with heart failure: Secondary | ICD-10-CM | POA: Diagnosis not present

## 2017-09-03 DIAGNOSIS — Z79899 Other long term (current) drug therapy: Secondary | ICD-10-CM | POA: Diagnosis not present

## 2017-09-03 DIAGNOSIS — D509 Iron deficiency anemia, unspecified: Secondary | ICD-10-CM | POA: Diagnosis present

## 2017-09-03 DIAGNOSIS — Z515 Encounter for palliative care: Secondary | ICD-10-CM

## 2017-09-03 DIAGNOSIS — F17210 Nicotine dependence, cigarettes, uncomplicated: Secondary | ICD-10-CM | POA: Diagnosis present

## 2017-09-03 DIAGNOSIS — Z8249 Family history of ischemic heart disease and other diseases of the circulatory system: Secondary | ICD-10-CM | POA: Diagnosis not present

## 2017-09-03 DIAGNOSIS — F10239 Alcohol dependence with withdrawal, unspecified: Secondary | ICD-10-CM | POA: Diagnosis present

## 2017-09-03 DIAGNOSIS — Z23 Encounter for immunization: Secondary | ICD-10-CM | POA: Diagnosis not present

## 2017-09-03 DIAGNOSIS — Z794 Long term (current) use of insulin: Secondary | ICD-10-CM

## 2017-09-03 DIAGNOSIS — J9621 Acute and chronic respiratory failure with hypoxia: Secondary | ICD-10-CM | POA: Diagnosis present

## 2017-09-03 DIAGNOSIS — J9622 Acute and chronic respiratory failure with hypercapnia: Secondary | ICD-10-CM | POA: Diagnosis present

## 2017-09-03 DIAGNOSIS — I480 Paroxysmal atrial fibrillation: Secondary | ICD-10-CM | POA: Diagnosis not present

## 2017-09-03 DIAGNOSIS — I482 Chronic atrial fibrillation: Secondary | ICD-10-CM | POA: Diagnosis present

## 2017-09-03 DIAGNOSIS — Z9981 Dependence on supplemental oxygen: Secondary | ICD-10-CM | POA: Diagnosis not present

## 2017-09-03 DIAGNOSIS — J9600 Acute respiratory failure, unspecified whether with hypoxia or hypercapnia: Secondary | ICD-10-CM

## 2017-09-03 DIAGNOSIS — E1165 Type 2 diabetes mellitus with hyperglycemia: Secondary | ICD-10-CM | POA: Diagnosis present

## 2017-09-03 DIAGNOSIS — Z7189 Other specified counseling: Secondary | ICD-10-CM | POA: Diagnosis not present

## 2017-09-03 DIAGNOSIS — J441 Chronic obstructive pulmonary disease with (acute) exacerbation: Secondary | ICD-10-CM | POA: Diagnosis not present

## 2017-09-03 DIAGNOSIS — J810 Acute pulmonary edema: Secondary | ICD-10-CM | POA: Diagnosis not present

## 2017-09-03 DIAGNOSIS — Z833 Family history of diabetes mellitus: Secondary | ICD-10-CM

## 2017-09-03 DIAGNOSIS — R0602 Shortness of breath: Secondary | ICD-10-CM

## 2017-09-03 DIAGNOSIS — K219 Gastro-esophageal reflux disease without esophagitis: Secondary | ICD-10-CM | POA: Diagnosis present

## 2017-09-03 DIAGNOSIS — J9690 Respiratory failure, unspecified, unspecified whether with hypoxia or hypercapnia: Secondary | ICD-10-CM | POA: Diagnosis not present

## 2017-09-03 DIAGNOSIS — I5023 Acute on chronic systolic (congestive) heart failure: Secondary | ICD-10-CM | POA: Diagnosis not present

## 2017-09-03 DIAGNOSIS — Z811 Family history of alcohol abuse and dependence: Secondary | ICD-10-CM

## 2017-09-03 DIAGNOSIS — J9601 Acute respiratory failure with hypoxia: Secondary | ICD-10-CM

## 2017-09-03 DIAGNOSIS — Z7901 Long term (current) use of anticoagulants: Secondary | ICD-10-CM

## 2017-09-03 DIAGNOSIS — I2510 Atherosclerotic heart disease of native coronary artery without angina pectoris: Secondary | ICD-10-CM | POA: Diagnosis present

## 2017-09-03 DIAGNOSIS — I10 Essential (primary) hypertension: Secondary | ICD-10-CM | POA: Diagnosis not present

## 2017-09-03 DIAGNOSIS — Z9049 Acquired absence of other specified parts of digestive tract: Secondary | ICD-10-CM

## 2017-09-03 DIAGNOSIS — G4733 Obstructive sleep apnea (adult) (pediatric): Secondary | ICD-10-CM | POA: Diagnosis present

## 2017-09-03 DIAGNOSIS — E785 Hyperlipidemia, unspecified: Secondary | ICD-10-CM | POA: Diagnosis present

## 2017-09-03 DIAGNOSIS — Z791 Long term (current) use of non-steroidal anti-inflammatories (NSAID): Secondary | ICD-10-CM

## 2017-09-03 DIAGNOSIS — I509 Heart failure, unspecified: Secondary | ICD-10-CM | POA: Diagnosis not present

## 2017-09-03 DIAGNOSIS — R0603 Acute respiratory distress: Secondary | ICD-10-CM

## 2017-09-03 DIAGNOSIS — I5043 Acute on chronic combined systolic (congestive) and diastolic (congestive) heart failure: Secondary | ICD-10-CM | POA: Diagnosis present

## 2017-09-03 DIAGNOSIS — Z7951 Long term (current) use of inhaled steroids: Secondary | ICD-10-CM | POA: Diagnosis not present

## 2017-09-03 DIAGNOSIS — J189 Pneumonia, unspecified organism: Secondary | ICD-10-CM | POA: Diagnosis present

## 2017-09-03 DIAGNOSIS — K7460 Unspecified cirrhosis of liver: Secondary | ICD-10-CM | POA: Diagnosis present

## 2017-09-03 DIAGNOSIS — K5900 Constipation, unspecified: Secondary | ICD-10-CM | POA: Diagnosis present

## 2017-09-03 DIAGNOSIS — Z8673 Personal history of transient ischemic attack (TIA), and cerebral infarction without residual deficits: Secondary | ICD-10-CM | POA: Diagnosis not present

## 2017-09-03 DIAGNOSIS — J440 Chronic obstructive pulmonary disease with acute lower respiratory infection: Secondary | ICD-10-CM | POA: Diagnosis present

## 2017-09-03 DIAGNOSIS — Z9111 Patient's noncompliance with dietary regimen: Secondary | ICD-10-CM | POA: Diagnosis not present

## 2017-09-03 DIAGNOSIS — B182 Chronic viral hepatitis C: Secondary | ICD-10-CM | POA: Diagnosis present

## 2017-09-03 DIAGNOSIS — R7989 Other specified abnormal findings of blood chemistry: Secondary | ICD-10-CM | POA: Diagnosis not present

## 2017-09-03 LAB — PROTIME-INR
INR: 2.73
PROTHROMBIN TIME: 28.7 s — AB (ref 11.4–15.2)

## 2017-09-03 LAB — URINALYSIS, COMPLETE (UACMP) WITH MICROSCOPIC
BILIRUBIN URINE: NEGATIVE
Bacteria, UA: NONE SEEN
GLUCOSE, UA: NEGATIVE mg/dL
HGB URINE DIPSTICK: NEGATIVE
Ketones, ur: NEGATIVE mg/dL
Leukocytes, UA: NEGATIVE
NITRITE: NEGATIVE
PROTEIN: 100 mg/dL — AB
SPECIFIC GRAVITY, URINE: 1.01 (ref 1.005–1.030)
pH: 6 (ref 5.0–8.0)

## 2017-09-03 LAB — CBC WITH DIFFERENTIAL/PLATELET
BASOS ABS: 0.1 10*3/uL (ref 0–0.1)
BASOS PCT: 1 %
EOS ABS: 0.2 10*3/uL (ref 0–0.7)
EOS PCT: 2 %
HCT: 30 % — ABNORMAL LOW (ref 40.0–52.0)
Hemoglobin: 8.5 g/dL — ABNORMAL LOW (ref 13.0–18.0)
LYMPHS PCT: 10 %
Lymphs Abs: 0.9 10*3/uL — ABNORMAL LOW (ref 1.0–3.6)
MCH: 20.1 pg — ABNORMAL LOW (ref 26.0–34.0)
MCHC: 28.2 g/dL — ABNORMAL LOW (ref 32.0–36.0)
MCV: 71.1 fL — ABNORMAL LOW (ref 80.0–100.0)
Monocytes Absolute: 0.9 10*3/uL (ref 0.2–1.0)
Monocytes Relative: 10 %
Neutro Abs: 6.9 10*3/uL — ABNORMAL HIGH (ref 1.4–6.5)
Neutrophils Relative %: 77 %
PLATELETS: 175 10*3/uL (ref 150–440)
RBC: 4.22 MIL/uL — AB (ref 4.40–5.90)
RDW: 18.6 % — ABNORMAL HIGH (ref 11.5–14.5)
WBC: 9 10*3/uL (ref 3.8–10.6)

## 2017-09-03 LAB — PROCALCITONIN: Procalcitonin: 0.1 ng/mL

## 2017-09-03 LAB — COMPREHENSIVE METABOLIC PANEL
ALBUMIN: 3.5 g/dL (ref 3.5–5.0)
ALT: 49 U/L (ref 17–63)
AST: 48 U/L — ABNORMAL HIGH (ref 15–41)
Alkaline Phosphatase: 80 U/L (ref 38–126)
Anion gap: 10 (ref 5–15)
BUN: 21 mg/dL — ABNORMAL HIGH (ref 6–20)
CO2: 29 mmol/L (ref 22–32)
Calcium: 8.6 mg/dL — ABNORMAL LOW (ref 8.9–10.3)
Chloride: 97 mmol/L — ABNORMAL LOW (ref 101–111)
Creatinine, Ser: 0.9 mg/dL (ref 0.61–1.24)
GFR calc Af Amer: >60 mL/min (ref >60)
GFR calc non Af Amer: >60 mL/min (ref >60)
GLUCOSE: 257 mg/dL — AB (ref 65–99)
POTASSIUM: 4.9 mmol/L (ref 3.5–5.1)
SODIUM: 136 mmol/L (ref 135–145)
Total Bilirubin: 1 mg/dL (ref 0.3–1.2)
Total Protein: 6.9 g/dL (ref 6.5–8.1)

## 2017-09-03 LAB — BLOOD GAS, VENOUS
ACID-BASE EXCESS: 3.8 mmol/L — AB (ref 0.0–2.0)
BICARBONATE: 31.7 mmol/L — AB (ref 20.0–28.0)
O2 SAT: 69.6 %
PH VEN: 7.27 (ref 7.250–7.430)
Patient temperature: 37
pCO2, Ven: 69 mmHg — ABNORMAL HIGH (ref 44.0–60.0)
pO2, Ven: 42 mmHg (ref 32.0–45.0)

## 2017-09-03 LAB — GLUCOSE, CAPILLARY
GLUCOSE-CAPILLARY: 237 mg/dL — AB (ref 65–99)
GLUCOSE-CAPILLARY: 289 mg/dL — AB (ref 65–99)
Glucose-Capillary: 263 mg/dL — ABNORMAL HIGH (ref 65–99)
Glucose-Capillary: 241 mg/dL — ABNORMAL HIGH (ref 65–99)
Glucose-Capillary: 320 mg/dL — ABNORMAL HIGH (ref 65–99)
Glucose-Capillary: 250 mg/dL — ABNORMAL HIGH (ref 65–99)

## 2017-09-03 LAB — BRAIN NATRIURETIC PEPTIDE: B Natriuretic Peptide: 1774 pg/mL — ABNORMAL HIGH (ref 0.0–100.0)

## 2017-09-03 LAB — TROPONIN I
TROPONIN I: 0.4 ng/mL — AB (ref <0.03)
Troponin I: 0.06 ng/mL (ref <0.03)
Troponin I: 0.35 ng/mL (ref <0.03)

## 2017-09-03 LAB — LACTIC ACID, PLASMA
LACTIC ACID, VENOUS: 1.2 mmol/L (ref 0.5–1.9)
LACTIC ACID, VENOUS: 1.5 mmol/L (ref 0.5–1.9)
Lactic Acid, Venous: 3.2 mmol/L (ref 0.5–1.9)

## 2017-09-03 LAB — MRSA PCR SCREENING: MRSA BY PCR: NEGATIVE

## 2017-09-03 MED ORDER — OXYCODONE-ACETAMINOPHEN 5-325 MG PO TABS
1.0000 | ORAL_TABLET | ORAL | Status: DC | PRN
  Administered 2017-09-03 – 2017-09-04 (×3): 2 via ORAL
  Administered 2017-09-04: 1 via ORAL
  Administered 2017-09-04 – 2017-09-05 (×5): 2 via ORAL
  Filled 2017-09-03 (×5): qty 2
  Filled 2017-09-03: qty 1
  Filled 2017-09-03 (×3): qty 2

## 2017-09-03 MED ORDER — ORAL CARE MOUTH RINSE: 15.0000 mL | Freq: Two times a day (BID) | OROMUCOSAL | Status: DC

## 2017-09-03 MED ORDER — FUROSEMIDE 10 MG/ML IJ SOLN
80.0000 mg | Freq: Once | INTRAMUSCULAR | Status: AC
  Administered 2017-09-03: 80 mg via INTRAVENOUS

## 2017-09-03 MED ORDER — METHYLPREDNISOLONE SODIUM SUCC 125 MG IJ SOLR
125.0000 mg | Freq: Once | INTRAMUSCULAR | Status: AC
  Administered 2017-09-03: 125 mg via INTRAVENOUS

## 2017-09-03 MED ORDER — PANTOPRAZOLE SODIUM 40 MG PO TBEC
40.0000 mg | DELAYED_RELEASE_TABLET | Freq: Every day | ORAL | Status: DC
  Administered 2017-09-04: 40 mg via ORAL
  Filled 2017-09-03: qty 1

## 2017-09-03 MED ORDER — ACETAMINOPHEN 325 MG PO TABS
650.0000 mg | ORAL_TABLET | ORAL | Status: DC | PRN
  Administered 2017-09-05: 650 mg via ORAL
  Filled 2017-09-03: qty 2

## 2017-09-03 MED ORDER — IPRATROPIUM-ALBUTEROL 0.5-2.5 (3) MG/3ML IN SOLN
3.0000 mL | Freq: Four times a day (QID) | RESPIRATORY_TRACT | Status: DC
  Administered 2017-09-03 – 2017-09-05 (×7): 3 mL via RESPIRATORY_TRACT
  Filled 2017-09-03 (×9): qty 3

## 2017-09-03 MED ORDER — WARFARIN - PHARMACIST DOSING INPATIENT
Freq: Every day | Status: DC
  Administered 2017-09-03: 18:00:00

## 2017-09-03 MED ORDER — SOTALOL HCL 80 MG PO TABS
80.0000 mg | ORAL_TABLET | Freq: Two times a day (BID) | ORAL | Status: DC
  Administered 2017-09-03 – 2017-09-05 (×4): 80 mg via ORAL
  Filled 2017-09-03 (×5): qty 1

## 2017-09-03 MED ORDER — SODIUM CHLORIDE 0.9 % IV SOLN: 250.0000 mL | INTRAVENOUS | Status: DC | PRN

## 2017-09-03 MED ORDER — LORAZEPAM 2 MG/ML IJ SOLN
2.0000 mg | INTRAMUSCULAR | Status: DC | PRN
  Administered 2017-09-03 – 2017-09-04 (×2): 2 mg via INTRAVENOUS
  Filled 2017-09-03 (×2): qty 1

## 2017-09-03 MED ORDER — WARFARIN SODIUM 3 MG PO TABS
3.0000 mg | ORAL_TABLET | Freq: Every day | ORAL | Status: DC
  Administered 2017-09-03: 3 mg via ORAL
  Filled 2017-09-03 (×2): qty 1

## 2017-09-03 MED ORDER — FOLIC ACID 1 MG PO TABS
1.0000 mg | ORAL_TABLET | Freq: Every day | ORAL | Status: DC
  Administered 2017-09-03 – 2017-09-05 (×3): 1 mg via ORAL
  Filled 2017-09-03 (×3): qty 1

## 2017-09-03 MED ORDER — FUROSEMIDE 10 MG/ML IJ SOLN
INTRAMUSCULAR | Status: AC
  Administered 2017-09-03: 80 mg via INTRAVENOUS
  Filled 2017-09-03: qty 8

## 2017-09-03 MED ORDER — GUAIFENESIN-DM 100-10 MG/5ML PO SYRP
5.0000 mL | ORAL_SOLUTION | ORAL | Status: DC | PRN
  Filled 2017-09-03: qty 5

## 2017-09-03 MED ORDER — ALBUTEROL SULFATE (2.5 MG/3ML) 0.083% IN NEBU
INHALATION_SOLUTION | RESPIRATORY_TRACT | Status: AC
  Administered 2017-09-03: 5 mg via RESPIRATORY_TRACT
  Filled 2017-09-03: qty 6

## 2017-09-03 MED ORDER — DEXTROSE 5 % IV SOLN
500.0000 mg | Freq: Once | INTRAVENOUS | Status: AC
  Administered 2017-09-03: 500 mg via INTRAVENOUS
  Filled 2017-09-03: qty 500

## 2017-09-03 MED ORDER — BENAZEPRIL HCL 10 MG PO TABS
10.0000 mg | ORAL_TABLET | Freq: Every day | ORAL | Status: DC
  Administered 2017-09-04 – 2017-09-05 (×2): 10 mg via ORAL
  Filled 2017-09-03 (×2): qty 1

## 2017-09-03 MED ORDER — ALBUTEROL SULFATE (2.5 MG/3ML) 0.083% IN NEBU
5.0000 mg | INHALATION_SOLUTION | Freq: Once | RESPIRATORY_TRACT | Status: AC
  Administered 2017-09-03: 5 mg via RESPIRATORY_TRACT

## 2017-09-03 MED ORDER — ATORVASTATIN CALCIUM 20 MG PO TABS
10.0000 mg | ORAL_TABLET | Freq: Every day | ORAL | Status: DC
  Administered 2017-09-03 – 2017-09-04 (×2): 10 mg via ORAL
  Filled 2017-09-03 (×2): qty 1

## 2017-09-03 MED ORDER — CEFTRIAXONE SODIUM IN DEXTROSE 20 MG/ML IV SOLN
1.0000 g | Freq: Once | INTRAVENOUS | Status: AC
  Administered 2017-09-03: 1 g via INTRAVENOUS
  Filled 2017-09-03: qty 50

## 2017-09-03 MED ORDER — ADULT MULTIVITAMIN W/MINERALS CH
1.0000 | ORAL_TABLET | Freq: Every day | ORAL | Status: DC
  Administered 2017-09-03 – 2017-09-05 (×3): 1 via ORAL
  Filled 2017-09-03 (×3): qty 1

## 2017-09-03 MED ORDER — THIAMINE HCL 100 MG/ML IJ SOLN
100.0000 mg | Freq: Every day | INTRAMUSCULAR | Status: DC
  Administered 2017-09-03 – 2017-09-05 (×3): 100 mg via INTRAVENOUS
  Filled 2017-09-03: qty 2
  Filled 2017-09-03: qty 1
  Filled 2017-09-03: qty 2

## 2017-09-03 MED ORDER — ONDANSETRON HCL 4 MG/2ML IJ SOLN: 4.0000 mg | Freq: Four times a day (QID) | INTRAMUSCULAR | Status: DC | PRN

## 2017-09-03 MED ORDER — CEFTRIAXONE SODIUM 1 G IJ SOLR: 1.0000 g | INTRAMUSCULAR | Status: DC

## 2017-09-03 MED ORDER — INSULIN ASPART 100 UNIT/ML ~~LOC~~ SOLN
0.0000 [IU] | Freq: Three times a day (TID) | SUBCUTANEOUS | Status: DC
  Administered 2017-09-03 – 2017-09-04 (×2): 11 [IU] via SUBCUTANEOUS
  Administered 2017-09-04 (×2): 7 [IU] via SUBCUTANEOUS
  Administered 2017-09-05: 11 [IU] via SUBCUTANEOUS
  Administered 2017-09-05: 08:00:00 20 [IU] via SUBCUTANEOUS
  Filled 2017-09-03 (×6): qty 1

## 2017-09-03 MED ORDER — INSULIN ASPART 100 UNIT/ML ~~LOC~~ SOLN
0.0000 [IU] | Freq: Every day | SUBCUTANEOUS | Status: DC
  Administered 2017-09-03: 4 [IU] via SUBCUTANEOUS
  Administered 2017-09-04: 3 [IU] via SUBCUTANEOUS
  Filled 2017-09-03 (×2): qty 1

## 2017-09-03 MED ORDER — METHYLPREDNISOLONE SODIUM SUCC 40 MG IJ SOLR
40.0000 mg | Freq: Two times a day (BID) | INTRAMUSCULAR | Status: DC
  Administered 2017-09-03 – 2017-09-05 (×4): 40 mg via INTRAVENOUS
  Filled 2017-09-03 (×4): qty 1

## 2017-09-03 MED ORDER — SODIUM CHLORIDE 0.9% FLUSH
3.0000 mL | Freq: Two times a day (BID) | INTRAVENOUS | Status: DC
  Administered 2017-09-03 – 2017-09-05 (×5): 3 mL via INTRAVENOUS

## 2017-09-03 MED ORDER — AMLODIPINE BESYLATE 5 MG PO TABS
5.0000 mg | ORAL_TABLET | Freq: Every day | ORAL | Status: DC
  Administered 2017-09-04 – 2017-09-05 (×2): 5 mg via ORAL
  Filled 2017-09-03 (×2): qty 1

## 2017-09-03 MED ORDER — DEXMEDETOMIDINE HCL IN NACL 400 MCG/100ML IV SOLN
0.4000 ug/kg/h | INTRAVENOUS | Status: DC
  Administered 2017-09-03: 0.4 ug/kg/h via INTRAVENOUS
  Administered 2017-09-03 – 2017-09-04 (×2): 0.5 ug/kg/h via INTRAVENOUS
  Filled 2017-09-03 (×3): qty 100

## 2017-09-03 MED ORDER — METHYLPREDNISOLONE SODIUM SUCC 125 MG IJ SOLR: 60.0000 mg | Freq: Four times a day (QID) | INTRAMUSCULAR | Status: DC

## 2017-09-03 MED ORDER — FUROSEMIDE 10 MG/ML IJ SOLN
40.0000 mg | Freq: Two times a day (BID) | INTRAMUSCULAR | Status: DC
  Administered 2017-09-03: 40 mg via INTRAVENOUS
  Filled 2017-09-03 (×2): qty 4

## 2017-09-03 MED ORDER — METHYLPREDNISOLONE SODIUM SUCC 125 MG IJ SOLR
INTRAMUSCULAR | Status: AC
  Administered 2017-09-03: 125 mg via INTRAVENOUS
  Filled 2017-09-03: qty 2

## 2017-09-03 MED ORDER — INSULIN ASPART 100 UNIT/ML ~~LOC~~ SOLN
0.0000 [IU] | Freq: Three times a day (TID) | SUBCUTANEOUS | Status: DC
  Administered 2017-09-03: 7 [IU] via SUBCUTANEOUS
  Filled 2017-09-03: qty 1

## 2017-09-03 MED ORDER — MELATONIN 5 MG PO TABS
10.0000 mg | ORAL_TABLET | Freq: Every day | ORAL | Status: DC
  Administered 2017-09-03 – 2017-09-04 (×2): 10 mg via ORAL
  Filled 2017-09-03 (×3): qty 2

## 2017-09-03 MED ORDER — SODIUM CHLORIDE 0.9% FLUSH: 3.0000 mL | INTRAVENOUS | Status: DC | PRN

## 2017-09-03 MED ORDER — INFLUENZA VAC SPLIT HIGH-DOSE 0.5 ML IM SUSY
0.5000 mL | PREFILLED_SYRINGE | INTRAMUSCULAR | Status: AC
  Administered 2017-09-05: 08:00:00 0.5 mL via INTRAMUSCULAR
  Filled 2017-09-03: qty 0.5

## 2017-09-03 MED ORDER — AZITHROMYCIN 500 MG IV SOLR: 500.0000 mg | INTRAVENOUS | Status: DC

## 2017-09-03 MED ORDER — CHLORHEXIDINE GLUCONATE 0.12 % MT SOLN
15.0000 mL | Freq: Two times a day (BID) | OROMUCOSAL | Status: DC
  Administered 2017-09-03: 15 mL via OROMUCOSAL
  Filled 2017-09-03: qty 15

## 2017-09-03 NOTE — Care Management (Signed)
RNCM consult received and will follow. PT evaluation would be helpful to determine discharge needs.

## 2017-09-03 NOTE — ED Triage Notes (Signed)
Pt in via [place] with wife at bedside.  Pt with acute onset shortness of breath this morning.  Pt on 4L nasal cannula chronically, saturation in 80's upon arrival.  Pt pale, diaphoretic, and in respiratory distress upon arrival.  ED MD called to bedside at this time.

## 2017-09-03 NOTE — Progress Notes (Signed)
*  PRELIMINARY RESULTS* Echocardiogram 2D Echocardiogram has been performed.  Sherrie Sport 09/03/2017, 3:03 PM

## 2017-09-03 NOTE — Consult Note (Signed)
[facility] Consultation    SYNOPSIS   66 yo male with right hilar carcinoid, emphysema, smoker, etoh abuse, VC paralysis with multiple previous intubations and trach, CHF, now presents with dyspnea and pulm edema.   ASSESSMENT/PLAN    Acute respiratory failure with pulmonary edema.  ETOH abuse with withdrawal.  Emphysema, possible AECOPD, doubt pneumonia (pct pending). History of vocal cord paralysis with numerous intubations - hoareness of voice.  Carcinoid tumor with sub-segmental atelectasis.  Afib on coumadin.   --Diuresis, consulted [facility] cardiology, who sees him outpt.  --Wean down steroids, if PCT negative can de-escalate abx.  --DT protocol, will start precedex prn for agitation. Minimize sedation due to potential difficulty with intubation.   INTAKE / OUTPUT:  Intake/Output Summary (Last 24 hours) at 09/03/17 1355 Last data filed at 09/03/17 1141  Gross per 24 hour  Intake              250 ml  Output             1060 ml  Net             -810 ml    Micro/culture results:  BCx2 -- UC -- Sputum--  Antibiotics: Azithromycin 9/26>> ceftriazone 9/26>>  MAJOR EVENTS/TEST RESULTS: 09/26; Admitted to ICU on bipap  Best Practices  DVT Prophylaxis: on coumadin GI Prophylaxis: --  ---------------------------------------  ---------------------------------------   Name: JANICE SEALES MRN: [id number] DOB: January 31, 1951    ADMISSION DATE:  09/03/2017 CONSULTATION DATE:  9/26  REFERRING MD :  Dr. Margaretmary Eddy.   CHIEF COMPLAINT:  Dyspnea.    HISTORY OF PRESENT ILLNESS:   The patient is a 66 yo male, he sees Dr. Raul Del outpatient for COPD, and a history of carcinoid hilar tumor. He has a history of ongoing alcohol abuse, tobacco abuse. He has undergone numerous intubations in the past (his wife says more than 10), and he has been trached in the past. He has systolic CHF,  echo  From 03/29/16 showed EF of 50%. He rpesented with 3 days of  progressive dyspnea which did not respond to a course of oral steroids.    PAST MEDICAL HISTORY :  Past Medical History:  Diagnosis Date  . A-fib ([place])   . Alcohol abuse   . Alcohol use   . Aortic aneurysm ([place]) 06/19/2017   Ascending 4.4 cm, CT scan July 2018  . Aortic aneurysm, intrathoracic ([place])   . Ascending aortic aneurysm ([place])   . Ascending aortic aneurysm ([place])   . Atrial fibrillation ([place]) 05/08/2016  . Broken rib April 2016   History of broken ribs x 3  . Bronchitis   . CAD (coronary artery disease)   . CHF (congestive heart failure) ([place])   . Chronic alcoholism ([place])   . Cirrhosis ([place])   . Closed nondisplaced fracture of fifth metatarsal bone of left foot Nov. 2016   left  . COPD (chronic obstructive pulmonary disease) ([place])   . Depression   . Diabetes mellitus without complication ([place])   . Emphysema of lung ([place])   . Esophageal rupture   . GERD (gastroesophageal reflux disease)   . Hepatitis C   . Hip arthritis   . History of diverticulitis    ruptured and sepsis  . History of hiatal hernia   . History of smoking   . HOH (hard of hearing)   . Hyperlipidemia   . Hypertension   . Hypoxemia   . Insomnia   . Movement disorder   .  Onychogryphosis   . Pneumonia   . Rib fracture   . Seasonal allergies   . Shortness of breath dyspnea   . Sleep apnea    uses O2 @2  L/min at night  . Stroke ([place])   . Thrombocytopenia ([place])    Past Surgical History:  Procedure Laterality Date  . CATARACT EXTRACTION W/PHACO Right 01/16/2016   Procedure: CATARACT EXTRACTION PHACO AND INTRAOCULAR LENS PLACEMENT (IOC);  Surgeon: Birder Robson, MD;  Location: [facility] ORS;  Service: Ophthalmology;  Laterality: Right;  [place]: 00:45.0 AP%: 25.2 CDE: 11.36  Lot #[id number]  . CHOLECYSTECTOMY    . COLON RESECTION    . COLONOSCOPY WITH PROPOFOL N/A 08/27/2016   Procedure: COLONOSCOPY WITH PROPOFOL;  Surgeon: Lollie Sails, MD;  Location[facility] ENDOSCOPY;  Service: Endoscopy;   Laterality: N/A;  . ELECTROPHYSIOLOGIC STUDY N/A 05/22/2016   Procedure: CARDIOVERSION;  Surgeon: Teodoro Spray, MD;  Location: [facility] ORS;  Service: Cardiovascular;  Laterality: N/A;  . HERNIA REPAIR    . PEG PLACEMENT N/A   . PEG TUBE REMOVAL N/A   . PILONIDAL CYST EXCISION N/A   . TRACHEOSTOMY N/A    Prior to Admission medications   Medication Sig Start Date End Date Taking? Authorizing Provider  amLODipine (NORVASC) 5 MG tablet Take 5 mg by mouth daily. 06/13/17  Yes [provider]  atorvastatin (LIPITOR) 10 MG tablet Take 1 tablet (10 mg total) by mouth at bedtime. 11/09/16  Yes Lada, Satira Anis, MD  B-D INS SYRINGE 0.5CC/30GX1/2" 30G X 1/2" 0.5 ML MISC AS DIRECTED. 07/17/15  Yes Crissman, Jeannette How, MD  benazepril (LOTENSIN) 10 MG tablet Take 10 mg by mouth daily.    Yes [provider]  fluticasone-salmeterol (ADVAIR HFA) 115-21 MCG/ACT inhaler Inhale 2 puffs into the lungs every 12 (twelve) hours.   Yes [provider]  furosemide (LASIX) 20 MG tablet Take 20-40 mg by mouth daily.    Yes [provider]  insulin aspart (NOVOLOG) 100 UNIT/ML FlexPen Sliding scale Sub-Q for FSBS 200-249 give 3 units, 250-299 give 5 units, 300-349 give 7 units, 350-399 give 9 units, 400+ give 12 units Patient taking differently: Inject 12-20 Units into the skin 3 (three) times daily with meals. 12 units every morning at breakfast, 12 units every day at lunch, and 20 units every day at supper 05/22/16  Yes Lada, Satira Anis, MD  ipratropium-albuterol (DUONEB) 0.5-2.5 (3) MG/3ML SOLN Take 3 mLs by nebulization 4 (four) times daily as needed. For shortness of breath/wheezing   Yes [provider]  omeprazole (PRILOSEC) 20 MG capsule Take 1 capsule (20 mg total) by mouth daily. Caution:prolonged use may increase risk of pneumonia, colitis, osteoporosis, anemia 05/15/16  Yes Lada, Satira Anis, MD  sotalol (BETAPACE) 80 MG tablet Take 1 tablet (80 mg total) by mouth 2 (two) times  daily. 05/22/16  Yes FathJavier Docker, MD  TRESIBA FLEXTOUCH 200 UNIT/ML SOPN Inject 48 Units into the skin at bedtime.    Yes [provider]  warfarin (COUMADIN) 3 MG tablet Take 1 tablet (3 mg total) by mouth daily. Patient taking differently: Take 3 mg by mouth at bedtime.  06/18/17 06/18/18 Yes Epifanio Lesches, MD  zolpidem (AMBIEN) 10 MG tablet Take 10 mg by mouth at bedtime as needed for sleep.    Yes [provider]   No Known Allergies  FAMILY HISTORY:  Family History  Problem Relation Age of Onset  . Coronary artery disease Mother   . Hypertension Mother   .  Heart disease Mother   . Lung disease Mother   . Coronary artery disease Father   . Hypertension Father   . Diabetes Father   . Heart disease Father   . Cancer Father        prostate  . Alcohol abuse Brother    SOCIAL HISTORY:  reports that he has been smoking Cigarettes.  He has a 10.00 pack-year smoking history. He has never used smokeless tobacco. He reports that he drinks alcohol. He reports that he does not use drugs.  REVIEW OF SYSTEMS:   Constitutional: Feels well. Cardiovascular: No chest pain.  Pulmonary: Denies dyspnea.   The remainder of systems were reviewed and were found to be negative other than what is documented in the HPI.    VITAL SIGNS: Temp:  [97.1 F (36.2 C)-97.5 F (36.4 C)] 97.5 F (36.4 C) (09/26 1140) Pulse Rate:  [63-100] 74 (09/26 1140) Resp:  [21-29] 21 (09/26 1140) BP: (128-141)/(66-89) 141/66 (09/26 1140) SpO2:  [82 %-100 %] 96 % (09/26 1333) FiO2 (%):  [40 %] 40 % (09/26 0836) Weight:  [230 lb (104.3 kg)-239 lb 10.2 oz (108.7 kg)] 239 lb 10.2 oz (108.7 kg) (09/26 1140) HEMODYNAMICS:   VENTILATOR SETTINGS: FiO2 (%):  [40 %] 40 % INTAKE / OUTPUT:  Intake/Output Summary (Last 24 hours) at 09/03/17 1355 Last data filed at 09/03/17 1141  Gross per 24 hour  Intake              250 ml  Output             1060 ml  Net             -810 ml    Physical  Examination:   VS: BP (!) 141/66 (BP Location: Right Arm)   Pulse 74   Temp (!) 97.5 F (36.4 C) (Axillary)   Resp (!) 21   Ht 5\' 11"  (1.803 m)   Wt 239 lb 10.2 oz (108.7 kg)   SpO2 96%   BMI 33.42 kg/m   General Appearance: No distress  Neuro:without focal findings, mental status, speech normal,. HEENT: PERRLA, EOM intact, no ptosis, no other lesions noticed;  Pulmonary: bilateral scattered rhonchi.  CardiovascularNormal S1,S2.  No m/r/g.    Abdomen: Benign, Soft, non-tender, No masses, hepatosplenomegaly, No lymphadenopathy Renal:  No costovertebral tenderness  GU:  Not performed at this time. Endoc: No evident thyromegaly, no signs of acromegaly. Skin:   warm, no rashes, no ecchymosis  Extremities: normal, no cyanosis, clubbing, no edema, warm with normal capillary refill.    LABS: Reviewed   LABORATORY PANEL:   CBC  Recent Labs Lab 09/03/17 0810  WBC 9.0  HGB 8.5*  HCT 30.0*  PLT 175    Chemistries   Recent Labs Lab 09/03/17 0810  NA 136  K 4.9  CL 97*  CO2 29  GLUCOSE 257*  BUN 21*  CREATININE 0.90  CALCIUM 8.6*  AST 48*  ALT 49  ALKPHOS 80  BILITOT 1.0     Recent Labs Lab 09/03/17 0809 09/03/17 1121  GLUCAP 263* 241*   No results for input(s): PHART, PCO2ART, PO2ART in the last 168 hours.  Recent Labs Lab 09/03/17 0810  AST 48*  ALT 49  ALKPHOS 80  BILITOT 1.0  ALBUMIN 3.5    Cardiac Enzymes  Recent Labs Lab 09/03/17 1208  TROPONINI 0.35*    RADIOLOGY:  Dg Chest Port 1 View  Result Date: 09/03/2017 CLINICAL DATA:  Shortness of breath and decreased  oxygen saturation EXAM: PORTABLE CHEST 1 VIEW COMPARISON:  Chest radiograph June 17, 2017 and chest CT June 17, 2017 FINDINGS: There is airspace consolidation in both lower lobes, more on the right than on the left. There is a small right pleural effusion. Lungs elsewhere clear. Heart is mildly enlarged with mild pulmonary venous hypertension. No adenopathy. No bone lesions.  IMPRESSION: Underlying pulmonary vascular congestion. Airspace consolidation in both lower lobes, more severe on the right than on the left, likely pneumonia. Rather minimal right pleural effusion evident. Followup PA and lateral chest radiographs recommended in 3-4 weeks following trial of antibiotic therapy to ensure resolution and exclude underlying malignancy. Electronically Signed   By: Lowella Grip III M.D.   On: 09/03/2017 08:29       --Marda Stalker, MD.  Board Certified in Internal Medicine, Pulmonary Medicine, [place], and Sleep Medicine.  ICU Pager [phone] [facility] Office Number: [phone]  Patricia Pesa, M.D.  Merton Border, M.D   09/03/2017, 1:55 PM

## 2017-09-03 NOTE — Progress Notes (Signed)
Inpatient Diabetes Program Recommendations  AACE/ADA: New Consensus Statement on Inpatient Glycemic Control (2015)  Target Ranges:  Prepandial:   less than 140 mg/dL      Peak postprandial:   less than 180 mg/dL (1-2 hours)      Critically ill patients:  140 - 180 mg/dL   Results for Marvin Lewis, Marvin Lewis (MRN [id number]) as of 09/03/2017 13:13  Ref. Range 09/03/2017 08:09 09/03/2017 11:21  Glucose-Capillary Latest Ref Range: 65 - 99 mg/dL 263 (H) 241 (H)    Admit with: SOB  History: DM, CHF, COPD, ETOH  Home DM Meds: Tresiba 48 units QHS       Novolog 12 units with Breakfast/ 12 units with Lunch/ 20 units with Dinner  Current Insulin Orders: None yet      MD- Note patient getting Solumedrol 60 mg Q6 hours at present.  CBGs quite high.  Please consider the following:  1. Start Lantus 38 units QHS (80% total home dose to start)  2. Start Novolog Resistant Correction Scale/ SSI (0-20 units) Q4 hours      --Will follow patient during hospitalization--  [name] RN, MSN, CDE Diabetes Coordinator Inpatient Glycemic Control Team Team Pager: [phone] (8a-5p)

## 2017-09-03 NOTE — Consult Note (Signed)
Name: Marvin Lewis MRN: [id number] DOB: Nov 05, 1951    ADMISSION DATE:  09/03/2017 CONSULTATION DATE: 09/03/2017  REFERRING MD : Dr. Margaretmary Eddy   CHIEF COMPLAINT: Shortness of Breath   BRIEF PATIENT DESCRIPTION:  66 yo male admitted with acute on chronic respiratory failure secondary to pulmonary edema and possible pneumonia  Requiring continuous Bipap  SIGNIFICANT EVENTS  09/26-Pt admitted stepdown unit   STUDIES:  None   HISTORY OF PRESENT ILLNESS:   This is a 66 yo male with PMH of Thrombocytopenia, Stroke, OSA, Pneumonia, Onychogryphosis, Carcinoid tumor, Vocal Cord Paralysis, Hyperlipidemia, HTN, Smoking, Hiatal Hernia, Diverticulitis, Hepatitis C, GERD, Esophageal Rupture, Emphysema, Diabetes Mellitus, COPD, Mechanical Intubation, Tracheostomy, Chronic home O2 @ 4L, Cirrhosis, ETOH Abuse (drinks 2 pints of liquor weekly), CHF, CAD, Bronchitis, Atrial Fibrillation, and Ascending Aortic Aneurysm. He presented to [facility] ER 09/26 with c/o increased bilateral leg swelling onset 2 days prior to presentation to the ER.  This morning he developed shortness of breath prompting current ER visit. In the ER CXR revealed possible pneumonia with pulmonary congestion, therefore he was given iv lasix and placed on Bipap due to worsening respiratory distress. He was subsequently admitted to the stepdown unit by hospitalist team for further workup and treatment PCCM consulted.  PAST MEDICAL HISTORY :   has a past medical history of A-fib ([place]); Alcohol abuse; Alcohol use; Aortic aneurysm ([place]) (06/19/2017); Aortic aneurysm, intrathoracic ([place]); Ascending aortic aneurysm ([place]); Ascending aortic aneurysm ([place]); Atrial fibrillation ([place]) (05/08/2016); Broken rib (April 2016); Bronchitis; CAD (coronary artery disease); CHF (congestive heart failure) ([place]); Chronic alcoholism ([place]); Cirrhosis ([place]); Closed nondisplaced fracture of fifth metatarsal bone of left foot (Nov. 2016); COPD (chronic obstructive  pulmonary disease) ([place]); Depression; Diabetes mellitus without complication ([place]); Emphysema of lung ([place]); Esophageal rupture; GERD (gastroesophageal reflux disease); Hepatitis C; Hip arthritis; History of diverticulitis; History of hiatal hernia; History of smoking; HOH (hard of hearing); Hyperlipidemia; Hypertension; Hypoxemia; Insomnia; Movement disorder; Onychogryphosis; Pneumonia; Rib fracture; Seasonal allergies; Shortness of breath dyspnea; Sleep apnea; Stroke ([place]); and Thrombocytopenia ([place]).  has a past surgical history that includes PEG placement (N/A); PEG tube removal (N/A); Tracheostomy (N/A); Cholecystectomy; Pilonidal cyst excision (N/A); Hernia repair; Colon resection; Cataract extraction w/PHACO (Right, 01/16/2016); Cardiac catheterization (N/A, 05/22/2016); and Colonoscopy with propofol (N/A, 08/27/2016). Prior to Admission medications   Medication Sig Start Date End Date Taking? Authorizing Provider  amLODipine (NORVASC) 5 MG tablet Take 5 mg by mouth daily. 06/13/17  Yes [provider]  atorvastatin (LIPITOR) 10 MG tablet Take 1 tablet (10 mg total) by mouth at bedtime. 11/09/16  Yes Lada, Satira Anis, MD  B-D INS SYRINGE 0.5CC/30GX1/2" 30G X 1/2" 0.5 ML MISC AS DIRECTED. 07/17/15  Yes Crissman, Jeannette How, MD  benazepril (LOTENSIN) 10 MG tablet Take 10 mg by mouth daily.    Yes [provider]  fluticasone-salmeterol (ADVAIR HFA) 115-21 MCG/ACT inhaler Inhale 2 puffs into the lungs every 12 (twelve) hours.   Yes [provider]  furosemide (LASIX) 20 MG tablet Take 20-40 mg by mouth daily.    Yes [provider]  insulin aspart (NOVOLOG) 100 UNIT/ML FlexPen Sliding scale Sub-Q for FSBS 200-249 give 3 units, 250-299 give 5 units, 300-349 give 7 units, 350-399 give 9 units, 400+ give 12 units Patient taking differently: Inject 12-20 Units into the skin 3 (three) times daily with meals. 12 units every morning at breakfast, 12 units every day at lunch, and 20  units every day at supper 05/22/16  Yes Lada, Rip Harbour  P, MD  ipratropium-albuterol (DUONEB) 0.5-2.5 (3) MG/3ML SOLN Take 3 mLs by nebulization 4 (four) times daily as needed. For shortness of breath/wheezing   Yes [provider]  omeprazole (PRILOSEC) 20 MG capsule Take 1 capsule (20 mg total) by mouth daily. Caution:prolonged use may increase risk of pneumonia, colitis, osteoporosis, anemia 05/15/16  Yes Lada, Satira Anis, MD  sotalol (BETAPACE) 80 MG tablet Take 1 tablet (80 mg total) by mouth 2 (two) times daily. 05/22/16  Yes FathJavier Docker, MD  TRESIBA FLEXTOUCH 200 UNIT/ML SOPN Inject 48 Units into the skin at bedtime.    Yes [provider]  warfarin (COUMADIN) 3 MG tablet Take 1 tablet (3 mg total) by mouth daily. Patient taking differently: Take 3 mg by mouth at bedtime.  06/18/17 06/18/18 Yes Epifanio Lesches, MD  zolpidem (AMBIEN) 10 MG tablet Take 10 mg by mouth at bedtime as needed for sleep.    Yes [provider]   No Known Allergies  FAMILY HISTORY:  family history includes Alcohol abuse in his brother; Cancer in his father; Coronary artery disease in his father and mother; Diabetes in his father; Heart disease in his father and mother; Hypertension in his father and mother; Lung disease in his mother. SOCIAL HISTORY:  reports that he has been smoking Cigarettes.  He has a 10.00 pack-year smoking history. He has never used smokeless tobacco. He reports that he drinks alcohol. He reports that he does not use drugs.  REVIEW OF SYSTEMS: Positives in BOLD  Constitutional: Negative for fever, chills, weight loss, malaise/fatigue and diaphoresis.  HENT: Negative for hearing loss, ear pain, nosebleeds, congestion, sore throat, neck pain, tinnitus and ear discharge.   Eyes: Negative for blurred vision, double vision, photophobia, pain, discharge and redness.  Respiratory: cough, hemoptysis, sputum production, shortness of breath, wheezing and stridor.     Cardiovascular: pleuritic chest pain, palpitations, orthopnea, claudication, leg swelling and PND.  Gastrointestinal: Negative for heartburn, nausea, vomiting, abdominal pain, diarrhea, constipation, blood in stool and melena.  Genitourinary: Negative for dysuria, urgency, frequency, hematuria and flank pain.  Musculoskeletal: Negative for myalgias, back pain, joint pain and falls.  Skin: Negative for itching and rash.  Neurological: Negative for dizziness, tingling, tremors, sensory change, speech change, focal weakness, seizures, loss of consciousness, weakness and headaches.  Endo/Heme/Allergies: Negative for environmental allergies and polydipsia. Does not bruise/bleed easily.  SUBJECTIVE:  Pt states breathing has improved currently off Bipap   VITAL SIGNS: Temp:  [97.1 F (36.2 C)-97.5 F (36.4 C)] 97.5 F (36.4 C) (09/26 1140) Pulse Rate:  [63-100] 74 (09/26 1140) Resp:  [21-29] 21 (09/26 1140) BP: (128-141)/(66-89) 141/66 (09/26 1140) SpO2:  [82 %-100 %] 96 % (09/26 1333) FiO2 (%):  [40 %] 40 % (09/26 0836) Weight:  [104.3 kg (230 lb)-108.7 kg (239 lb 10.2 oz)] 108.7 kg (239 lb 10.2 oz) (09/26 1140)  PHYSICAL EXAMINATION: General: well developed, well nourished Caucasian male, NAD  Neuro: alert and oriented, follows commands, PERRLA HEENT: supple, no JVD Cardiovascular: irregular, irregular, no M/R/G Lungs: rhonchi with faint expiratory wheezes throughout, even, non labored  Abdomen: +BS x4, soft, non tender, non distended  Musculoskeletal: normal tone, 2+ bilateral lower extremity edema  Skin: intact no rashes or lesions    Recent Labs Lab 09/03/17 0810  NA 136  K 4.9  CL 97*  CO2 29  BUN 21*  CREATININE 0.90  GLUCOSE 257*    Recent Labs Lab 09/03/17 0810  HGB 8.5*  HCT 30.0*  WBC  9.0  PLT 175   Dg Chest Port 1 View  Result Date: 09/03/2017 CLINICAL DATA:  Shortness of breath and decreased oxygen saturation EXAM: PORTABLE CHEST 1 VIEW COMPARISON:   Chest radiograph June 17, 2017 and chest CT June 17, 2017 FINDINGS: There is airspace consolidation in both lower lobes, more on the right than on the left. There is a small right pleural effusion. Lungs elsewhere clear. Heart is mildly enlarged with mild pulmonary venous hypertension. No adenopathy. No bone lesions. IMPRESSION: Underlying pulmonary vascular congestion. Airspace consolidation in both lower lobes, more severe on the right than on the left, likely pneumonia. Rather minimal right pleural effusion evident. Followup PA and lateral chest radiographs recommended in 3-4 weeks following trial of antibiotic therapy to ensure resolution and exclude underlying malignancy. Electronically Signed   By: Lowella Grip III M.D.   On: 09/03/2017 08:29    ASSESSMENT / PLAN: Acute on chronic hypercapnic respiratory failure secondary to pulmonary edema and possible pneumonia  Elevated troponin likely secondary to demand ischemia Anemia without acute blood loss Hyperglycemia  Chronic atrial fibrillation Hx: ETOH Abuse, Cirrhosis, and COPD   P: Prn Bipap for dyspnea and/or hypoxia Continue scheduled bronchodilator therapy  Continue IV steroids wean as tolerated Continue iv lasix Follow cultures  Continue abx  Trend WBC and monitor fever curve Trend PCT  TSH pending  Repeat CXR in am  Trend troponin's Continuous telemetry monitoring Continue coumadin dosing per pharmacy  Trend CBC  Monitor for s/sx of bleeding  Cardiology consulted appreciate input Echo pending  CIWA protocol  Will add mvi, thiamine, and folic acid  Will add SSI   Marda Stalker, [place] Pager [phone] (please enter 7 digits) PCCM Consult Pager [phone] (please enter 7 digits)

## 2017-09-03 NOTE — ED Notes (Signed)
Attempted report on patient after 20 minutes assigned bed. Charge nurse reported she has not approved bed yet for patient report

## 2017-09-03 NOTE — ED Provider Notes (Addendum)
[facility] Emergency Department Provider Note  ____________________________________________   I have reviewed the triage vital signs and the nursing notes.   HISTORY  Chief Complaint Shortness of Breath    HPI TREON Lewis is a 66 y.o. male  Who presents today c/o sob. Pt has hx of etoh abuse afib COPD, CHF, on 4 L of oxygen at baseline, was okay respiratorywhen he went to bed. Has been getting increased swelling in his legs apparently over the last couple days. Somewhat limited history.Level 5 chart caveat; no further history available due to patient status. states that he has had increased shortness of breath he denies chest pain, he has been intubated his family and he states 8 or 9 times in the past he had a trach once for failure to wean. [place] is a full code. He denies fever or productive cough. He is not having any chest pain. His symptoms started this morning in terms of his shortness of breath. Is worse when he lies flat and better when he sits up. Has not tried to walk yet.   Past Medical History:  Diagnosis Date  . A-fib (Marvin Lewis)   . Alcohol abuse   . Alcohol use   . Aortic aneurysm (Marvin Lewis) 06/19/2017   Ascending 4.4 cm, CT scan July 2018  . Aortic aneurysm, intrathoracic ([place])   . Ascending aortic aneurysm (Marvin Lewis)   . Ascending aortic aneurysm (Marvin Lewis)   . Atrial fibrillation (Marvin Lewis) 05/08/2016  . Broken rib April 2016   History of broken ribs x 3  . Bronchitis   . CAD (coronary artery disease)   . CHF (congestive heart failure) (Marvin Lewis)   . Chronic alcoholism ([place])   . Cirrhosis ([place])   . Closed nondisplaced fracture of fifth metatarsal bone of left foot Nov. 2016   left  . COPD (chronic obstructive pulmonary disease) ([place])   . Depression   . Diabetes mellitus without complication ([place])   . Emphysema of lung ([place])   . Esophageal rupture   . GERD (gastroesophageal reflux disease)   . Hepatitis C   . Hip arthritis   . History of  diverticulitis    ruptured and sepsis  . History of hiatal hernia   . History of smoking   . HOH (hard of hearing)   . Hyperlipidemia   . Hypertension   . Hypoxemia   . Insomnia   . Movement disorder   . Onychogryphosis   . Pneumonia   . Rib fracture   . Seasonal allergies   . Shortness of breath dyspnea   . Sleep apnea    uses O2 @2  L/min at night  . Stroke ([place])   . Thrombocytopenia [facility])     Patient Active Problem List   Diagnosis Date Noted  . Aortic aneurysm ([place]) 06/19/2017  . Hemoptysis 06/17/2017  . A-fib ([place]) 05/21/2016  . Leg cramps 05/08/2016  . Anemia 05/08/2016  . Atrial fibrillation ([place]) 05/08/2016  . Abnormal weight gain 04/22/2016  . Medication monitoring encounter 04/22/2016  . Elevated brain natriuretic peptide (BNP) level 04/22/2016  . Persistent atrial fibrillation (Marvin Lewis) 03/29/2016  . Breathlessness on exertion 03/27/2016  . Gout 02/23/2016  . Hyperkalemia 01/12/2016  . Onychomycosis 01/10/2016  . Hoarseness of voice 01/10/2016  . Tobacco abuse 01/10/2016  . Hx of completed stroke 01/10/2016  . Athletes foot 08/09/2015  . Chronic alcoholism (Marvin Lewis)   . Movement disorder   . COPD (chronic obstructive pulmonary disease) (Marvin Lewis)   . Emphysema of lung (  Marvin Lewis)   . Type II diabetes mellitus, uncontrolled (Marvin Lewis)   . Hyperlipidemia   . Hypertension   . CAD (coronary artery disease)   . Hepatitis C   . Cirrhosis ([place])   . Alcohol abuse   . Onychogryphosis   . Insomnia   . Hip arthritis   . Hypoxemia   . 1st degree AV block   . Controlled type 2 diabetes mellitus without complication (Marvin Lewis) 76/54/6503    Past Surgical History:  Procedure Laterality Date  . CATARACT EXTRACTION W/PHACO Right 01/16/2016   Procedure: CATARACT EXTRACTION PHACO AND INTRAOCULAR LENS PLACEMENT (IOC);  Surgeon: Birder Robson, MD;  Location: [facility] ORS;  Service: Ophthalmology;  Laterality: Right;  [place]: 00:45.0 AP%: 25.2 CDE: 11.36  Lot #[id number]  . CHOLECYSTECTOMY    .  COLON RESECTION    . COLONOSCOPY WITH PROPOFOL N/A 08/27/2016   Procedure: COLONOSCOPY WITH PROPOFOL;  Surgeon: Lollie Sails, MD;  [facility] ENDOSCOPY;  Service: Endoscopy;  Laterality: N/A;  . ELECTROPHYSIOLOGIC STUDY N/A 05/22/2016   Procedure: CARDIOVERSION;  Surgeon: Teodoro Spray, MD;  Location: [facility] ORS;  Service: Cardiovascular;  Laterality: N/A;  . HERNIA REPAIR    . PEG PLACEMENT N/A   . PEG TUBE REMOVAL N/A   . PILONIDAL CYST EXCISION N/A   . TRACHEOSTOMY N/A     Prior to Admission medications   Medication Sig Start Date End Date Taking? Authorizing Provider  albuterol (PROVENTIL) (2.5 MG/3ML) 0.083% nebulizer solution Take 2.5 mg by nebulization every 6 (six) hours as needed for wheezing or shortness of breath.    [provider]  amLODipine (NORVASC) 5 MG tablet Take 5 mg by mouth daily. 06/13/17   [provider]  atorvastatin (LIPITOR) 10 MG tablet Take 1 tablet (10 mg total) by mouth at bedtime. 11/09/16   Arnetha Courser, MD  B-D INS SYRINGE 0.5CC/30GX1/2" 30G X 1/2" 0.5 ML MISC AS DIRECTED. 07/17/15   Guadalupe Maple, MD  B-D INS SYRINGE 0.5CC/31GX5/16 31G X 5/16" 0.5 ML MISC AS DIRECTED. 01/02/17   Lewis, Marvin P, DO  benazepril (LOTENSIN) 10 MG tablet Take 10 mg by mouth 2 (two) times daily. 06/13/17   [provider]  furosemide (LASIX) 20 MG tablet Take 20 mg by mouth.    [provider]  insulin aspart (NOVOLOG) 100 UNIT/ML FlexPen Sliding scale Sub-Q for FSBS 200-249 give 3 units, 250-299 give 5 units, 300-349 give 7 units, 350-399 give 9 units, 400+ give 12 units 05/22/16   Lewis, Marvin Anis, MD  insulin glargine (LANTUS) 100 UNIT/ML injection Inject 0.55 mLs (55 Units total) into the skin at bedtime. Or as directed Patient not taking: Reported on 06/17/2017 05/08/16   Arnetha Courser, MD  metFORMIN (GLUCOPHAGE-XR) 500 MG 24 hr tablet Take 1,000 mg by mouth daily. 05/27/17   [provider]  omeprazole (PRILOSEC) 20 MG  capsule Take 1 capsule (20 mg total) by mouth daily. Caution:prolonged use may increase risk of pneumonia, colitis, osteoporosis, anemia 05/15/16   Lewis, Marvin Anis, MD  oxyCODONE-acetaminophen (ROXICET) 5-325 MG tablet Take 1 tablet by mouth every 6 (six) hours as needed for moderate pain or severe pain. 07/09/17   Menshew, Dannielle Karvonen, PA-C  predniSONE (STERAPRED UNI-PAK 21 TAB) 10 MG (21) TBPK tablet Taper by  10 mg daily 06/18/17   Epifanio Lesches, MD  sotalol (BETAPACE) 80 MG tablet Take 1 tablet (80 mg total) by mouth 2 (two) times daily. 05/22/16   Teodoro Spray, MD  TRESIBA FLEXTOUCH 200 UNIT/ML SOPN Inject 46 Units into the skin daily.  06/13/17   [provider]  warfarin (COUMADIN) 3 MG tablet Take 1 tablet (3 mg total) by mouth daily. 06/18/17 06/18/18  Epifanio Lesches, MD    Allergies Patient has no known allergies.  Family History  Problem Relation Age of Onset  . Coronary artery disease Mother   . Hypertension Mother   . Heart disease Mother   . Lung disease Mother   . Coronary artery disease Father   . Hypertension Father   . Diabetes Father   . Heart disease Father   . Cancer Father        prostate  . Alcohol abuse Brother     Social History Social History  Substance Use Topics  . Smoking status: Current Every Day Smoker    Packs/day: 0.25    Years: 40.00    Types: Cigarettes  . Smokeless tobacco: Never Used  . Alcohol use Yes     Comment: Per wife, pt is alcoholic    Review of Systems Constitutional: No fever/chills Eyes: No visual changes. ENT: No sore throat. No stiff neck no neck pain Cardiovascular: Denies chest pain. Respiratory: positiveshortness of breath. Gastrointestinal:   no vomiting.  No diarrhea.  No constipation. Genitourinary: Negative for dysuria. Musculoskeletal: positivelower extremity swelling Skin: Negative for rash. Neurological: Negative for severe headaches, focal weakness or  numbness.   ____________________________________________   PHYSICAL EXAM:  VITAL SIGNS: ED Triage Vitals  Enc Vitals Group     BP --      Pulse Rate 09/03/17 0758 100     Resp 09/03/17 0758 (!) 29     Temp --      Temp src --      SpO2 09/03/17 0758 (!) 82 %     Weight 09/03/17 0759 230 lb (104.3 kg)     Height 09/03/17 0759 5\' 10"  (1.778 m)     Head Circumference --      Peak Flow --      Pain Score --      Pain Loc --      Pain Edu? --      Excl. in [place]? --     Constitutional: patient in acute respiratory distress, pale, speaks in 2 word sentences initially. Eyes: Conjunctivae are normal Head: Atraumatic HEENT: No congestion/rhinnorhea. Mucous membranes are moist.  Oropharynx non-erythematous Neck:   Nontender with no meningismus, no masses, no stridor Cardiovascular: Normal rate, regular rhythm. Grossly normal heart sounds.  Good peripheral circulation. Respiratory: increased work of breathing, decreased respiratory sounds auscultated in the bases with [place], no rhonchi Abdominal: Soft and nontender. No distention. No guarding no rebound Back:  There is no focal tenderness or step off.  there is no midline tenderness there are no lesions noted. there is no CVA tenderness Musculoskeletal: No lower extremity tenderness, no upper extremity tenderness. No joint effusions, no DVT signs strong distal pulses tense bilateral pitting symmetric edema Neurologic:  Normal speech and language. No gross focal neurologic deficits are appreciated.  Skin:  Skin is warm, dry and intact. No rash noted. Psychiatric: Mood and affect are normal. Speech and behavior are normal.  ____________________________________________   LABS (all labs ordered are listed, but only abnormal results are displayed)  Labs Reviewed  CULTURE, BLOOD (ROUTINE X 2)  CULTURE, BLOOD (ROUTINE X 2)  CBC WITH DIFFERENTIAL/PLATELET  PROTIME-INR  COMPREHENSIVE METABOLIC PANEL  TROPONIN I  BRAIN NATRIURETIC  PEPTIDE  URINALYSIS, COMPLETE (UACMP)  WITH MICROSCOPIC  BLOOD GAS, VENOUS    Pertinent labs  results that were available during my care of the patient were reviewed by me and considered in my medical decision making (see chart for details). ____________________________________________  EKG  I personally interpreted any EKGs ordered by me or triage sinus rhythm rate 106 bpm, mild tachycardia noted, normal axis, there are T-wave depressions to the right which could be strain pattern, no ST elevations. ____________________________________________  RADIOLOGY  Pertinent labs & imaging results that were available during my care of the patient were reviewed by me and considered in my medical decision making (see chart for details). If possible, patient and/or family made aware of any abnormal findings. ____________________________________________    PROCEDURES  Procedure(s) performed: None  Procedures  Critical Care performed: CRITICAL CARE Performed by: Schuyler Amor   Total critical care time: 55 minutes  Critical care time was exclusive of separately billable procedures and treating other patients.  Critical care was necessary to treat or prevent imminent or life-threatening deterioration.  Critical care was time spent personally by me on the following activities: development of treatment plan with patient and/or surrogate as well as nursing, discussions with consultants, evaluation of patient's response to treatment, examination of patient, obtaining history from patient or surrogate, ordering and performing treatments and interventions, ordering and review of laboratory studies, ordering and review of radiographic studies, pulse oximetry and re-evaluation of patient's condition.   ____________________________________________   INITIAL IMPRESSION / ASSESSMENT AND PLAN / ED COURSE  Pertinent labs & imaging results that were available during my care of the patient were  reviewed by me and considered in my medical decision making (see chart for details).  patient here with acute respiratory distress, multifactorial most likely. History of COPD and CHF. However most consistent with CHF. I sent him upright in the bed when I first came into the room and that seemed to help we have placed him on BiPAP he is tolerating that well, is my hope that we can forestall intubation. Patient is however acutely very ill today, we are giving him IV Lasix, Solu-Medrol, and breathing treatments to see if we can do everything to optimize his breathing. Clinically low suspicion for obvious pneumonia given patient's symptoms however we will evaluate chest x-ray and blood work for that. He is doing much better after a short course of BiPAP already.  Clinical Course as of Sep 03 925  Wed Sep 03, 2017  0908 patient looks much better on BiPAP, we will continue to observe him blood work coming back unchanged significantly from prior  [JM]    Clinical Course User Index [JM] Schuyler Amor, MD   _________  Lactate noted, low specificity. I don't believe the patient to be septic at this time, white count is normal, blood pressures holding, I think the worst could do for this patient would be to aggressively give him fluids. Chest x-ray to me looks like failure, however, radiology feels it may be pneumonia develop a we'll treat him for community-acquired pneumonia. He is maintaining his respiratory status well on BiPAP and we will admit him to the hospital for further evaluation. Low suspicion for sepsis given very significant stigmata ofacute decompensated CHF_especially given how well he is responding to treatment.__________________________________   FINAL CLINICAL IMPRESSION(S) / ED DIAGNOSES  Final diagnoses:  SOB (shortness of breath)      This chart was dictated using voice recognition software.  Despite best efforts to proofread,  errors can occur which  can change meaning.       Schuyler Amor, MD 09/03/17 1886    Schuyler Amor, MD 09/03/17 [phone]

## 2017-09-03 NOTE — Progress Notes (Signed)
ANTICOAGULATION CONSULT NOTE  Pharmacy Consult for warfarin dosing  Indication: atrial fibrilation  No Known Allergies  Patient Measurements: Height: 5\' 11"  (180.3 cm) Weight: 239 lb 10.2 oz (108.7 kg) IBW/kg (Calculated) : 75.3   Vital Signs: Temp: 98.7 F (37.1 C) (09/26 1930) Temp Source: Oral (09/26 1930) BP: 119/84 (09/26 1800) Pulse Rate: 69 (09/26 1800)  Labs:  Recent Labs  09/03/17 0810 09/03/17 1208 09/03/17 1746  HGB 8.5*  --   --   HCT 30.0*  --   --   PLT 175  --   --   LABPROT 28.7*  --   --   INR 2.73  --   --   CREATININE 0.90  --   --   TROPONINI 0.06* 0.35* 0.40*    Estimated Creatinine Clearance: 102.7 mL/min (by C-G formula based on SCr of 0.9 mg/dL).   Medications:  Scheduled:  . [START ON 09/04/2017] amLODipine  5 mg Oral Daily  . atorvastatin  10 mg Oral QHS  . [START ON 09/04/2017] benazepril  10 mg Oral Daily  . chlorhexidine  15 mL Mouth Rinse BID  . folic acid  1 mg Oral Daily  . furosemide  40 mg Intravenous Q12H  . [START ON 09/04/2017] Influenza vac split quadrivalent PF  0.5 mL Intramuscular Tomorrow-1000  . insulin aspart  0-20 Units Subcutaneous TID WC  . insulin aspart  0-5 Units Subcutaneous QHS  . ipratropium-albuterol  3 mL Nebulization Q6H  . mouth rinse  15 mL Mouth Rinse q12n4p  . Melatonin  10 mg Oral QHS  . methylPREDNISolone (SOLU-MEDROL) injection  40 mg Intravenous Q12H  . multivitamin with minerals  1 tablet Oral Daily  . [START ON 09/04/2017] pantoprazole  40 mg Oral Daily  . sodium chloride flush  3 mL Intravenous Q12H  . sotalol  80 mg Oral BID  . thiamine injection  100 mg Intravenous Daily  . warfarin  3 mg Oral q1800  . Warfarin - Pharmacist Dosing Inpatient   Does not apply q1800   Infusions:  . sodium chloride    . dexmedetomidine (PRECEDEX) IV infusion 0.4 mcg/kg/hr (09/03/17 1518)    Assessment: Pharmacy consulted for warfarin dosing for 65 yo male patient admitted with CHF exacerbation and acute  respiratory failure. Patient takes warfarin 3mg  daily as an outpatient. Patient did receive azithromycin and ceftriaxone x 1 in ED. Patient's wife reports patient regularlly   Goal of Therapy:  INR 2-3 Monitor platelets by anticoagulation protocol: Yes   Plan:  Will continue warfarin 3mg  q1800. Will obtain INR with am labs.   Pharmacy will continue to monitor and adjust per consult.   Simpson,Michael L 09/03/2017,9:39 PM

## 2017-09-03 NOTE — H&P (Signed)
[name]    MR#:  [id number]  DATE OF BIRTH:  Apr 13, 1951  DATE OF ADMISSION:  09/03/2017  PRIMARY CARE PHYSICIAN: Pcc[facility], [facility], MD   REQUESTING/REFERRING PHYSICIAN: [place]   CHIEF COMPLAINT:  Shortness of breath and cough  HISTORY OF PRESENT ILLNESS:  Marvin Lewis  is a 66 y.o. male with a known history of chronic atrial fibrillation on Coumadin, CHF, COPD, coronary artery disease, alcohol abuse, on chronic 4 L of oxygenis presenting to the ED with a chief complaint of shortness of breath and cough. Chest x-ray has revealed pulmonary congestion and infiltrates. Patient was intubated in the past and had a tracheostomy. Currently patient is placed on BiPAP, Solu-Medrol was given. IV Lasix and antibiotics were also started and hospitalist team is called to admit the patient. Wife at bedside.  PAST MEDICAL HISTORY:   Past Medical History:  Diagnosis Date  . A-fib ([place])   . Alcohol abuse   . Alcohol use   . Aortic aneurysm ([place]) 06/19/2017   Ascending 4.4 cm, CT scan July 2018  . Aortic aneurysm, intrathoracic ([place])   . Ascending aortic aneurysm ([place])   . Ascending aortic aneurysm ([place])   . Atrial fibrillation ([place]) 05/08/2016  . Broken rib April 2016   History of broken ribs x 3  . Bronchitis   . CAD (coronary artery disease)   . CHF (congestive heart failure) ([place])   . Chronic alcoholism ([place])   . Cirrhosis ([place])   . Closed nondisplaced fracture of fifth metatarsal bone of left foot Nov. 2016   left  . COPD (chronic obstructive pulmonary disease) ([place])   . Depression   . Diabetes mellitus without complication ([place])   . Emphysema of lung ([place])   . Esophageal rupture   . GERD (gastroesophageal reflux disease)   . Hepatitis C   . Hip arthritis   . History of diverticulitis    ruptured and sepsis  . History of hiatal hernia   . History of smoking   . HOH (hard of hearing)    . Hyperlipidemia   . Hypertension   . Hypoxemia   . Insomnia   . Movement disorder   . Onychogryphosis   . Pneumonia   . Rib fracture   . Seasonal allergies   . Shortness of breath dyspnea   . Sleep apnea    uses O2 @2  L/min at night  . Stroke ([place])   . Thrombocytopenia ([place])     PAST SURGICAL HISTOIRY:   Past Surgical History:  Procedure Laterality Date  . CATARACT EXTRACTION W/PHACO Right 01/16/2016   Procedure: CATARACT EXTRACTION PHACO AND INTRAOCULAR LENS PLACEMENT (IOC);  Surgeon: Birder Robson, MD;  Location: [facility] ORS;  Service: Ophthalmology;  Laterality: Right;  [place]: 00:45.0 AP%: 25.2 CDE: 11.36  Lot #[id number]  . CHOLECYSTECTOMY    . COLON RESECTION    . COLONOSCOPY WITH PROPOFOL N/A 08/27/2016   Procedure: COLONOSCOPY WITH PROPOFOL;  Surgeon: Lollie Sails, MD;  [facility] END[facility];  Service: Endoscopy;  Laterality: N/A;  . ELECTROPHYSIOLOGIC STUDY N/A 05/22/2016   Procedure: CARDIOVERSION;  Surgeon: Teodoro Spray, MD;  Location: [facility] ORS;  Service: Cardiovascular;  Laterality: N/A;  . HERNIA REPAIR    . PEG PLACEMENT N/A   . PEG TUBE REMOVAL N/A   . PILONIDAL CYST EXCISION N/A   . TRACHEOSTOMY N/A     SOCIAL HISTORY:   Social History  Substance Use  Topics  . Smoking status: Current Every Day Smoker    Packs/day: 0.25    Years: 40.00    Types: Cigarettes  . Smokeless tobacco: Never Used  . Alcohol use Yes     Comment: Per wife, pt is alcoholic    FAMILY HISTORY:   Family History  Problem Relation Age of Onset  . Coronary artery disease Mother   . Hypertension Mother   . Heart disease Mother   . Lung disease Mother   . Coronary artery disease Father   . Hypertension Father   . Diabetes Father   . Heart disease Father   . Cancer Father        prostate  . Alcohol abuse Brother     DRUG ALLERGIES:  No Known Allergies  REVIEW OF SYSTEMS:  CONSTITUTIONAL: No fever, fatigue or weakness.  EYES: No blurred or double vision.  EARS,  NOSE, AND THROAT: No tinnitus or ear pain.  RESPIRATORY: reporting productivecough, shortness of breath, wheezing   CARDIOVASCULAR: No chest pain, orthopnea, edema.  GASTROINTESTINAL: No nausea, vomiting, diarrhea or abdominal pain.  GENITOURINARY: No dysuria, hematuria.  ENDOCRINE: No polyuria, nocturia,  HEMATOLOGY: No anemia, easy bruising or bleeding SKIN: No rash or lesion. MUSCULOSKELETAL: No joint pain or arthritis.   NEUROLOGIC: No tingling, numbness, weakness.  PSYCHIATRY: No anxiety or depression.   MEDICATIONS AT HOME:   Prior to Admission medications   Medication Sig Start Date End Date Taking? Authorizing Provider  amLODipine (NORVASC) 5 MG tablet Take 5 mg by mouth daily. 06/13/17  Yes [provider]  atorvastatin (LIPITOR) 10 MG tablet Take 1 tablet (10 mg total) by mouth at bedtime. 11/09/16  Yes Lada, Satira Anis, MD  B-D INS SYRINGE 0.5CC/30GX1/2" 30G X 1/2" 0.5 ML MISC AS DIRECTED. 07/17/15  Yes Crissman, Jeannette How, MD  benazepril (LOTENSIN) 10 MG tablet Take 10 mg by mouth daily.    Yes [provider]  fluticasone-salmeterol (ADVAIR HFA) 115-21 MCG/ACT inhaler Inhale 2 puffs into the lungs every 12 (twelve) hours.   Yes [provider]  furosemide (LASIX) 20 MG tablet Take 20-40 mg by mouth daily.    Yes [provider]  insulin aspart (NOVOLOG) 100 UNIT/ML FlexPen Sliding scale Sub-Q for FSBS 200-249 give 3 units, 250-299 give 5 units, 300-349 give 7 units, 350-399 give 9 units, 400+ give 12 units Patient taking differently: Inject 12-20 Units into the skin 3 (three) times daily with meals. 12 units every morning at breakfast, 12 units every day at lunch, and 20 units every day at supper 05/22/16  Yes Lada, Satira Anis, MD  ipratropium-albuterol (DUONEB) 0.5-2.5 (3) MG/3ML SOLN Take 3 mLs by nebulization 4 (four) times daily as needed. For shortness of breath/wheezing   Yes [provider]  omeprazole (PRILOSEC) 20 MG capsule Take 1  capsule (20 mg total) by mouth daily. Caution:prolonged use may increase risk of pneumonia, colitis, osteoporosis, anemia 05/15/16  Yes Lada, Satira Anis, MD  sotalol (BETAPACE) 80 MG tablet Take 1 tablet (80 mg total) by mouth 2 (two) times daily. 05/22/16  Yes FathJavier Docker, MD  TRESIBA FLEXTOUCH 200 UNIT/ML SOPN Inject 48 Units into the skin at bedtime.    Yes [provider]  warfarin (COUMADIN) 3 MG tablet Take 1 tablet (3 mg total) by mouth daily. Patient taking differently: Take 3 mg by mouth at bedtime.  06/18/17 06/18/18 Yes Epifanio Lesches, MD  zolpidem (AMBIEN) 10 MG tablet Take 10 mg by mouth at bedtime as  needed for sleep.    Yes [provider]      VITAL SIGNS:  Blood pressure (!) 141/66, pulse 74, temperature (!) 97.5 F (36.4 C), temperature source Axillary, resp. rate (!) 21, height 5\' 11"  (1.803 m), weight 108.7 kg (239 lb 10.2 oz), SpO2 96 %.  PHYSICAL EXAMINATION:  GENERAL:  66 y.o.-year-old patient lying in the bed with no acute distress.  EYES: Pupils equal, round, reactive to light and accommodation. No scleral icterus. Extraocular muscles intact.  HEENT: Head atraumatic, normocephalic. Oropharynx and nasopharynx clear.  NECK:  Supple, no jugular venous distention. No thyroid enlargement, no tenderness.  LUNGS: diminished breath sounds bilaterally, diffuse wheezing, rales,rhonchi or crepitation. No use of accessory muscles of respiration. On BiPAP CARDIOVASCULAR: S1, S2 normal. No murmurs, rubs, or gallops.  ABDOMEN: Soft, nontender, nondistended. Bowel sounds present. No organomegaly or mass.  EXTREMITIES: No pedal edema, cyanosis, or clubbing.  NEUROLOGIC: Cranial nerves II through XII are intact. Muscle strength 5/5 in all extremities. Sensation intact. Gait not checked.  PSYCHIATRIC: The patient is alert and oriented x 3.  SKIN: No obvious rash, lesion, or ulcer.   LABORATORY PANEL:   CBC  Recent Labs Lab 09/03/17 0810  WBC 9.0  HGB 8.5*   HCT 30.0*  PLT 175   ------------------------------------------------------------------------------------------------------------------  Chemistries   Recent Labs Lab 09/03/17 0810  NA 136  K 4.9  CL 97*  CO2 29  GLUCOSE 257*  BUN 21*  CREATININE 0.90  CALCIUM 8.6*  AST 48*  ALT 49  ALKPHOS 80  BILITOT 1.0   ------------------------------------------------------------------------------------------------------------------  Cardiac Enzymes  Recent Labs Lab 09/03/17 1208  TROPONINI 0.35*   ------------------------------------------------------------------------------------------------------------------  RADIOLOGY:  Dg Chest Port 1 View  Result Date: 09/03/2017 CLINICAL DATA:  Shortness of breath and decreased oxygen saturation EXAM: PORTABLE CHEST 1 VIEW COMPARISON:  Chest radiograph June 17, 2017 and chest CT June 17, 2017 FINDINGS: There is airspace consolidation in both lower lobes, more on the right than on the left. There is a small right pleural effusion. Lungs elsewhere clear. Heart is mildly enlarged with mild pulmonary venous hypertension. No adenopathy. No bone lesions. IMPRESSION: Underlying pulmonary vascular congestion. Airspace consolidation in both lower lobes, more severe on the right than on the left, likely pneumonia. Rather minimal right pleural effusion evident. Followup PA and lateral chest radiographs recommended in 3-4 weeks following trial of antibiotic therapy to ensure resolution and exclude underlying malignancy. Electronically Signed   By: Lowella Grip III M.D.   On: 09/03/2017 08:29    EKG:   Orders placed or performed during the hospital encounter of 06/17/17  . ED EKG within 10 minutes  . ED EKG within 10 minutes  . EKG 12-Lead  . EKG 12-Lead    IMPRESSION AND PLAN:  Marvin Lewis  is a 66 y.o. male with a known history of chronic atrial fibrillation on Coumadin, CHF, COPD, coronary artery disease, alcohol abuse, on chronic 4 L  of oxygenis presenting to the ED with a chief complaint of shortness of breath and cough. Chest x-ray has revealed pulmonary congestion and infiltrates. Patient was intubated in the past and had a tracheostomy. Currently patient is placed on BiPAP, Solu-Medrol was given. IV Lasix and antibiotics were also started and hospitalist team is called to admit the patient. Wife at bedside.     #acute hypoxic respiratory failure secondary to CHF exacerbation,COPD exacerbation and pneumonia Admit to stepdown unit BiPAP Discussed with intensivist Dr. Ashby Dawes IV Lasix and IV steroids and  antibiotics  #Acute CHF exacerbation IV Lasix, daily weight monitoring and intake and output Echocardiogram Continue BiPAP and wean off as tolerated Cardiac he consulted  #Pneumonia IV Rocephin and azithromycin and sputum culture and sensitivity file can obtain sputum sample  #Acute COPD IV steroids and breathing treatments On antibiotics as well  #Chronic history of atrial fibrillation currently rate controlled Pharmacy consulted for Coumadin management  #Recent left wrist fracture continue present pain management as needed   All the records are reviewed and case discussed with ED provider. Management plans discussed with the patient, family and they are in agreement.  CODE STATUS: fc Marvin Lewis   TOTALcritical care TIME TAKING CARE OF THIS PATIENT: 45 minutes.   Note: This dictation was prepared with Dragon dictation along with smaller phrase technology. Any transcriptional errors that result from this process are unintentional.  Marvin Lewis M.D on 09/03/2017 at 3:13 PM  Between 7am to 6pm - Pager - [phone]  After 6pm go to [email or web address] - password [email or web address] [facility]  Office  [phone]  CC: Primary care physician; Pccm, Ander Gaster, MD

## 2017-09-03 NOTE — ED Notes (Signed)
Multiple attempts made by RNs for second IV unsuccessful.

## 2017-09-04 ENCOUNTER — Inpatient Hospital Stay: Payer: Medicare Other

## 2017-09-04 DIAGNOSIS — Z7189 Other specified counseling: Secondary | ICD-10-CM

## 2017-09-04 DIAGNOSIS — R0603 Acute respiratory distress: Secondary | ICD-10-CM

## 2017-09-04 DIAGNOSIS — I509 Heart failure, unspecified: Secondary | ICD-10-CM

## 2017-09-04 DIAGNOSIS — J441 Chronic obstructive pulmonary disease with (acute) exacerbation: Secondary | ICD-10-CM

## 2017-09-04 DIAGNOSIS — Z515 Encounter for palliative care: Secondary | ICD-10-CM

## 2017-09-04 LAB — TSH: TSH: 0.979 u[IU]/mL (ref 0.350–4.500)

## 2017-09-04 LAB — CBC
HCT: 24.3 % — ABNORMAL LOW (ref 40.0–52.0)
HEMOGLOBIN: 6.8 g/dL — AB (ref 13.0–18.0)
MCH: 19.4 pg — ABNORMAL LOW (ref 26.0–34.0)
MCHC: 28.2 g/dL — ABNORMAL LOW (ref 32.0–36.0)
MCV: 69 fL — ABNORMAL LOW (ref 80.0–100.0)
PLATELETS: 118 10*3/uL — AB (ref 150–440)
RBC: 3.52 MIL/uL — AB (ref 4.40–5.90)
RDW: 17.9 % — ABNORMAL HIGH (ref 11.5–14.5)
WBC: 4.1 10*3/uL (ref 3.8–10.6)

## 2017-09-04 LAB — BASIC METABOLIC PANEL
ANION GAP: 7 (ref 5–15)
BUN: 30 mg/dL — ABNORMAL HIGH (ref 6–20)
CHLORIDE: 95 mmol/L — AB (ref 101–111)
CO2: 33 mmol/L — AB (ref 22–32)
Calcium: 8.4 mg/dL — ABNORMAL LOW (ref 8.9–10.3)
Creatinine, Ser: 1 mg/dL (ref 0.61–1.24)
GFR calc Af Amer: >60 mL/min (ref >60)
GFR calc non Af Amer: >60 mL/min (ref >60)
GLUCOSE: 243 mg/dL — AB (ref 65–99)
Potassium: 5 mmol/L (ref 3.5–5.1)
Sodium: 135 mmol/L (ref 135–145)

## 2017-09-04 LAB — HEMOGLOBIN AND HEMATOCRIT, BLOOD
HCT: 26.4 % — ABNORMAL LOW (ref 40.0–52.0)
HCT: 26.8 % — ABNORMAL LOW (ref 40.0–52.0)
HEMATOCRIT: 25.4 % — AB (ref 40.0–52.0)
HEMOGLOBIN: 7.3 g/dL — AB (ref 13.0–18.0)
Hemoglobin: 7.5 g/dL — ABNORMAL LOW (ref 13.0–18.0)
Hemoglobin: 7.6 g/dL — ABNORMAL LOW (ref 13.0–18.0)

## 2017-09-04 LAB — TROPONIN I
TROPONIN I: 0.19 ng/mL — AB (ref <0.03)
TROPONIN I: 0.39 ng/mL — AB (ref <0.03)
Troponin I: 0.15 ng/mL (ref <0.03)

## 2017-09-04 LAB — PROTIME-INR
INR: 2.16
PROTHROMBIN TIME: 23.9 s — AB (ref 11.4–15.2)

## 2017-09-04 LAB — GLUCOSE, CAPILLARY
GLUCOSE-CAPILLARY: 243 mg/dL — AB (ref 65–99)
GLUCOSE-CAPILLARY: 273 mg/dL — AB (ref 65–99)
Glucose-Capillary: 216 mg/dL — ABNORMAL HIGH (ref 65–99)
Glucose-Capillary: 268 mg/dL — ABNORMAL HIGH (ref 65–99)

## 2017-09-04 LAB — PROCALCITONIN: Procalcitonin: <0.1 ng/mL

## 2017-09-04 LAB — PREPARE RBC (CROSSMATCH)

## 2017-09-04 LAB — MAGNESIUM: MAGNESIUM: 1.6 mg/dL — AB (ref 1.7–2.4)

## 2017-09-04 MED ORDER — NICOTINE 21 MG/24HR TD PT24
21.0000 mg | MEDICATED_PATCH | Freq: Every day | TRANSDERMAL | Status: DC
  Administered 2017-09-04 – 2017-09-05 (×2): 21 mg via TRANSDERMAL
  Filled 2017-09-04 (×2): qty 1

## 2017-09-04 MED ORDER — WARFARIN SODIUM 4 MG PO TABS
4.0000 mg | ORAL_TABLET | Freq: Every day | ORAL | Status: DC
  Filled 2017-09-04: qty 1

## 2017-09-04 MED ORDER — MAGNESIUM SULFATE 2 GM/50ML IV SOLN
2.0000 g | Freq: Once | INTRAVENOUS | Status: AC
  Administered 2017-09-04: 17:00:00 2 g via INTRAVENOUS
  Filled 2017-09-04: qty 50

## 2017-09-04 MED ORDER — SODIUM CHLORIDE 0.9 % IV SOLN
Freq: Once | INTRAVENOUS | Status: AC
  Administered 2017-09-04: 23:00:00 via INTRAVENOUS

## 2017-09-04 MED ORDER — FUROSEMIDE 10 MG/ML IJ SOLN: 20.0000 mg | Freq: Once | INTRAMUSCULAR | Status: AC

## 2017-09-04 MED ORDER — SODIUM CHLORIDE 0.9 % IV SOLN: Freq: Once | INTRAVENOUS | Status: DC

## 2017-09-04 MED ORDER — PANTOPRAZOLE SODIUM 40 MG IV SOLR
40.0000 mg | Freq: Two times a day (BID) | INTRAVENOUS | Status: DC
  Administered 2017-09-04 – 2017-09-05 (×2): 40 mg via INTRAVENOUS
  Filled 2017-09-04 (×3): qty 40

## 2017-09-04 MED ORDER — FUROSEMIDE 10 MG/ML IJ SOLN
20.0000 mg | Freq: Two times a day (BID) | INTRAMUSCULAR | Status: DC
  Administered 2017-09-04 – 2017-09-05 (×3): 20 mg via INTRAVENOUS
  Filled 2017-09-04 (×2): qty 2

## 2017-09-04 MED ORDER — ORAL CARE MOUTH RINSE
15.0000 mL | Freq: Two times a day (BID) | OROMUCOSAL | Status: DC
  Administered 2017-09-04: 15 mL via OROMUCOSAL

## 2017-09-04 NOTE — Consult Note (Addendum)
[facility] Consultation    SYNOPSIS   66 yo male with right hilar carcinoid, emphysema, smoker, etoh abuse, VC paralysis with multiple previous intubations and trach, CHF, now presents with dyspnea and pulm edema.   ASSESSMENT/PLAN    Acute respiratory failure with pulmonary edema.  ETOH abuse with withdrawal.  Emphysema, possible AECOPD, doubt pneumonia (pct negative). History of vocal cord paralysis with numerous intubations - hoareness of voice.  Carcinoid tumor with sub-segmental atelectasis.  Afib on coumadin.   --Diuresis, will decrease, consulted [facility] cardiology, who sees him outpt.  --Wean down steroids,PCT negative can de-escalate abx.  --DT protocol. Minimize sedation due to potential difficulty with intubation.   INTAKE / OUTPUT:  Intake/Output Summary (Last 24 hours) at 09/04/17 0733 Last data filed at 09/04/17 0600  Gross per 24 hour  Intake           431.79 ml  Output             1660 ml  Net         -1228.21 ml    Micro/culture results: mRSA PCR 09/03/17; negative BCx2 9/26;negative UC -- Sputum--  Antibiotics: Azithromycin 9/26>>9.27 ceftriazone 9/26>>9.27  MAJOR EVENTS/TEST RESULTS: 09/26; Admitted to ICU on bipap  Best Practices  DVT Prophylaxis: on coumadin GI Prophylaxis: --   Pt has refused bipap, he is often noncompliant with dietary restrictions in the past for dysphagia, he has had his feeding tube removed in the past for "quality of life". I suggested to him that endotracheal intubation and CPR would not be consistent with QOL, but he maintains that he would want to be full code, including intubation, CPR. I let him know that there may be complications with attempts to intubate him, and that may end up with a tracheostomy if he needs to be intubated again.  Will consult palliative care.  ---------------------------------------  ---------------------------------------   Name: Marvin Lewis MRN: [id number] DOB: 28-Apr-1951    ADMISSION DATE:  09/03/2017 CONSULTATION DATE:  9/26   Prior to Admission medications   Medication Sig Start Date End Date Taking? Authorizing Provider  amLODipine (NORVASC) 5 MG tablet Take 5 mg by mouth daily. 06/13/17  Yes [provider]  atorvastatin (LIPITOR) 10 MG tablet Take 1 tablet (10 mg total) by mouth at bedtime. 11/09/16  Yes Lada, Satira Anis, MD  B-D INS SYRINGE 0.5CC/30GX1/2" 30G X 1/2" 0.5 ML MISC AS DIRECTED. 07/17/15  Yes Crissman, Jeannette How, MD  benazepril (LOTENSIN) 10 MG tablet Take 10 mg by mouth daily.    Yes [provider]  fluticasone-salmeterol (ADVAIR HFA) 115-21 MCG/ACT inhaler Inhale 2 puffs into the lungs every 12 (twelve) hours.   Yes [provider]  furosemide (LASIX) 20 MG tablet Take 20-40 mg by mouth daily.    Yes [provider]  insulin aspart (NOVOLOG) 100 UNIT/ML FlexPen Sliding scale Sub-Q for FSBS 200-249 give 3 units, 250-299 give 5 units, 300-349 give 7 units, 350-399 give 9 units, 400+ give 12 units Patient taking differently: Inject 12-20 Units into the skin 3 (three) times daily with meals. 12 units every morning at breakfast, 12 units every day at lunch, and 20 units every day at supper 05/22/16  Yes Lada, Satira Anis, MD  ipratropium-albuterol (DUONEB) 0.5-2.5 (3) MG/3ML SOLN Take 3 mLs by nebulization 4 (four) times daily as needed. For shortness of breath/wheezing   Yes [provider]  omeprazole (PRILOSEC) 20 MG capsule Take 1 capsule (20 mg total) by mouth  daily. Caution:prolonged use may increase risk of pneumonia, colitis, osteoporosis, anemia 05/15/16  Yes Lada, Satira Anis, MD  sotalol (BETAPACE) 80 MG tablet Take 1 tablet (80 mg total) by mouth 2 (two) times daily. 05/22/16  Yes FathJavier Docker, MD  TRESIBA FLEXTOUCH 200 UNIT/ML SOPN Inject 48 Units into the skin at bedtime.    Yes [provider]  warfarin (COUMADIN) 3 MG tablet Take 1 tablet (3 mg total)  by mouth daily. Patient taking differently: Take 3 mg by mouth at bedtime.  06/18/17 06/18/18 Yes Epifanio Lesches, MD  zolpidem (AMBIEN) 10 MG tablet Take 10 mg by mouth at bedtime as needed for sleep.    Yes [provider]   No Known Allergies  FAMILY HISTORY:  Family History  Problem Relation Age of Onset  . Coronary artery disease Mother   . Hypertension Mother   . Heart disease Mother   . Lung disease Mother   . Coronary artery disease Father   . Hypertension Father   . Diabetes Father   . Heart disease Father   . Cancer Father        prostate  . Alcohol abuse Brother    VITAL SIGNS: Temp:  [97.1 F (36.2 C)-98.7 F (37.1 C)] 98.4 F (36.9 C) (09/27 0100) Pulse Rate:  [57-100] 57 (09/27 0600) Resp:  [11-29] 17 (09/27 0600) BP: (112-159)/(58-90) 123/75 (09/27 0600) SpO2:  [82 %-100 %] 97 % (09/27 0600) FiO2 (%):  [40 %] 40 % (09/26 0836) Weight:  [230 lb (104.3 kg)-239 lb 10.2 oz (108.7 kg)] 239 lb 10.2 oz (108.7 kg) (09/26 1140) HEMODYNAMICS:   VENTILATOR SETTINGS: FiO2 (%):  [40 %] 40 % INTAKE / OUTPUT:  Intake/Output Summary (Last 24 hours) at 09/04/17 0733 Last data filed at 09/04/17 0600  Gross per 24 hour  Intake           431.79 ml  Output             1660 ml  Net         -1228.21 ml    Physical Examination:   VS: BP 123/75   Pulse (!) 57   Temp 98.4 F (36.9 C) (Oral)   Resp 17   Ht 5\' 11"  (1.803 m)   Wt 239 lb 10.2 oz (108.7 kg)   SpO2 97%   BMI 33.42 kg/m   General Appearance: No distress  Neuro:without focal findings, mental status, speech hoarse HEENT: PERRLA, EOM intact, no ptosis, no other lesions noticed;  Pulmonary: decreased air entry bilaterally. CardiovascularNormal S1,S2.  No m/r/g.    Abdomen: Benign, Soft, non-tender, No masses, hepatosplenomegaly, No lymphadenopathy Renal:  No costovertebral tenderness  GU:  Not performed at this time. Endoc: No evident thyromegaly, no signs of acromegaly. Skin:   warm, no  rashes, no ecchymosis  Extremities: normal, no cyanosis, clubbing, no edema, warm with normal capillary refill.    LABS: Reviewed   LABORATORY PANEL:   CBC  Recent Labs Lab 09/04/17 0420 09/04/17 0619  WBC 4.1  --   HGB 6.8* 7.3*  HCT 24.3* 25.4*  PLT 118*  --     Chemistries   Recent Labs Lab 09/03/17 0810 09/04/17 0420  NA 136 135  K 4.9 5.0  CL 97* 95*  CO2 29 33*  GLUCOSE 257* 243*  BUN 21* 30*  CREATININE 0.90 1.00  CALCIUM 8.6* 8.4*  MG  --  1.6*  AST 48*  --   ALT 49  --  ALKPHOS 80  --   BILITOT 1.0  --      Recent Labs Lab 09/03/17 1121 09/03/17 1459 09/03/17 1628 09/03/17 1953 09/03/17 2125 09/04/17 0717  GLUCAP 241* 237* 289* 320* 250* 216*   No results for input(s): PHART, PCO2ART, PO2ART in the last 168 hours.  Recent Labs Lab 09/03/17 0810  AST 48*  ALT 49  ALKPHOS 80  BILITOT 1.0  ALBUMIN 3.5    Cardiac Enzymes  Recent Labs Lab 09/03/17 2336  TROPONINI 0.39*    RADIOLOGY:  Dg Chest Port 1 View  Result Date: 09/04/2017 CLINICAL DATA:  Respiratory failure. EXAM: PORTABLE CHEST 1 VIEW COMPARISON:  09/03/2017. FINDINGS: Mediastinum and hilar structures are normal. Cardiomegaly. Normal pulmonary vascularity . Persistent but improved bibasilar infiltrates. Small bilateral pleural effusions. No pneumothorax. IMPRESSION: 1. Persistent but improved bibasilar infiltrates. Small bilateral pleural effusions. 2. Cardiomegaly.  No pulmonary venous congestion . Electronically Signed   By: Marcello Moores  Register   On: 09/04/2017 06:43   Dg Chest Port 1 View  Result Date: 09/03/2017 CLINICAL DATA:  Shortness of breath and decreased oxygen saturation EXAM: PORTABLE CHEST 1 VIEW COMPARISON:  Chest radiograph June 17, 2017 and chest CT June 17, 2017 FINDINGS: There is airspace consolidation in both lower lobes, more on the right than on the left. There is a small right pleural effusion. Lungs elsewhere clear. Heart is mildly enlarged with mild  pulmonary venous hypertension. No adenopathy. No bone lesions. IMPRESSION: Underlying pulmonary vascular congestion. Airspace consolidation in both lower lobes, more severe on the right than on the left, likely pneumonia. Rather minimal right pleural effusion evident. Followup PA and lateral chest radiographs recommended in 3-4 weeks following trial of antibiotic therapy to ensure resolution and exclude underlying malignancy. Electronically Signed   By: Lowella Grip III M.D.   On: 09/03/2017 08:29       --Marda Stalker, MD.  Board Certified in Internal Medicine, Pulmonary Medicine, [place], and Sleep Medicine.  ICU Pager [phone] [facility] Office Number: [phone]  Patricia Pesa, M.D.  [place] Border, M.D   09/04/2017, 7:33 AM

## 2017-09-04 NOTE — Progress Notes (Signed)
Inpatient Diabetes Program Recommendations  AACE/ADA: New Consensus Statement on Inpatient Glycemic Control (2015)  Target Ranges:  Prepandial:   less than 140 mg/dL      Peak postprandial:   less than 180 mg/dL (1-2 hours)      Critically ill patients:  140 - 180 mg/dL   Lab Results  Component Value Date   GLUCAP 216 (H) 09/04/2017   HGBA1C 9.9 (H) 04/23/2016    Review of Glycemic Control  Results for JERYN, CERNEY (MRN [id number]) as of 09/04/2017 10:34  Ref. Range 09/03/2017 14:59 09/03/2017 16:28 09/03/2017 19:53 09/03/2017 21:25 09/04/2017 07:17  Glucose-Capillary Latest Ref Range: 65 - 99 mg/dL 237 (H) 289 (H) 320 (H) 250 (H) 216 (H)    Diabetes history: Type 2 Outpatient Diabetes medications: Novolog 12 units qam, 12 units with lunch and 20 units pre-supper.  Tresiba 48 units qhs.   Current orders for Inpatient glycemic control: Novolog 0-20 units tid, Novolog 0-5 units qhs  * solu-medrol 40mg  IV q12h  Inpatient Diabetes Program Recommendations:  Per ADA recommendations "consider performing an A1C on all patients with diabetes or hyperglycemia admitted to the hospital if not performed in the prior 3 months".  Consider adding Lantus 38 units qhs (80% home dose to start)  Gentry Fitz, RN, BA, MHA, CDE Diabetes Coordinator Inpatient Diabetes Program  [phone] (Team Pager) [phone] ([place]) 09/04/2017 10:45 AM

## 2017-09-04 NOTE — Progress Notes (Addendum)
ANTICOAGULATION CONSULT NOTE  Pharmacy Consult for warfarin dosing  Indication: atrial fibrilation  No Known Allergies  Patient Measurements: Height: 5\' 11"  (180.3 cm) Weight: 239 lb 10.2 oz (108.7 kg) IBW/kg (Calculated) : 75.3   Vital Signs: Temp: 97.5 F (36.4 C) (09/27 1203) Temp Source: Oral (09/27 1203) BP: 110/55 (09/27 1203) Pulse Rate: 60 (09/27 1203)  Labs:  Recent Labs  09/03/17 0810 09/03/17 1208 09/03/17 1746 09/03/17 2336 09/04/17 0420 09/04/17 0619  HGB 8.5*  --   --   --  6.8* 7.3*  HCT 30.0*  --   --   --  24.3* 25.4*  PLT 175  --   --   --  118*  --   LABPROT 28.7*  --   --   --  23.9*  --   INR 2.73  --   --   --  2.16  --   CREATININE 0.90  --   --   --  1.00  --   TROPONINI 0.06* 0.35* 0.40* 0.39*  --   --     Estimated Creatinine Clearance: 92.4 mL/min (by C-G formula based on SCr of 1 mg/dL).   Medications:  Scheduled:  . amLODipine  5 mg Oral Daily  . atorvastatin  10 mg Oral QHS  . benazepril  10 mg Oral Daily  . folic acid  1 mg Oral Daily  . furosemide  20 mg Intravenous Q12H  . Influenza vac split quadrivalent PF  0.5 mL Intramuscular Tomorrow-1000  . insulin aspart  0-20 Units Subcutaneous TID WC  . insulin aspart  0-5 Units Subcutaneous QHS  . ipratropium-albuterol  3 mL Nebulization Q6H  . mouth rinse  15 mL Mouth Rinse BID  . Melatonin  10 mg Oral QHS  . methylPREDNISolone (SOLU-MEDROL) injection  40 mg Intravenous Q12H  . multivitamin with minerals  1 tablet Oral Daily  . pantoprazole  40 mg Oral Daily  . sodium chloride flush  3 mL Intravenous Q12H  . sotalol  80 mg Oral BID  . thiamine injection  100 mg Intravenous Daily  . warfarin  3 mg Oral q1800  . Warfarin - Pharmacist Dosing Inpatient   Does not apply q1800   Infusions:  . sodium chloride    . sodium chloride    . dexmedetomidine (PRECEDEX) IV infusion Stopped (09/04/17 6415)    Assessment: Pharmacy consulted for warfarin dosing for 66 yo male patient  admitted with CHF exacerbation and acute respiratory failure. Patient takes warfarin 3mg  daily as an outpatient. Patient did receive azithromycin and ceftriaxone x 1 in ED. Patient's wife reports patient regularly consumes alcohol and is being covered with CIWA.   Goal of Therapy:  INR 2-3  Monitor platelets by anticoagulation protocol: Yes   Plan:  Spoke with patients outpatient pharmacy and patient takes warfarin 4mg  daily.   Will transition to home dose of warfarin 4mg  q1800. Will obtain INR with am labs.   Pharmacy will continue to monitor and adjust per consult.   MLS 09/04/2017,12:15 PM

## 2017-09-04 NOTE — Consult Note (Signed)
Consultation Note Date: 09/04/2017   Patient Name: Marvin Lewis  DOB: 10-30-1951  MRN: [id number]  Age / Sex: 66 y.o., male  PCP: Laverle Hobby, MD Referring Physician: Nicholes Mango, MD  Reason for Consultation: Establishing goals of care  HPI/Patient Profile: 66 y.o. male  with extensive past medical history of diastolic CHF, afib on coumadin, CAD, hypertension, hyperlipidemia, COPD on home oxygen 4L, multiple intubations, trach/PEG placement and removal, vocal cord paralysis, DM type 2, AAA, depression, ETOH abuse, liver cirrhosis, and right hilar carcinoid admitted on 09/03/2017 with shortness of breath and cough. In ED, chest xray revealed pulmonary congestion and infiltrates. Placed on BiPAP and given IV lasix, steroids, and antibiotics. Transferred out of ICU on 9/27. Palliative medicine consultation for goals of care/quality of life.  Clinical Assessment and Goals of Care: I have reviewed medical records, discussed with care team, and met with patient and wife Marvin Lewis) at bedside to discuss diagnosis, [place], EOL wishes, disposition and options.  Introduced Palliative Medicine as specialized medical care for people living with serious illness. It focuses on providing relief from the symptoms and stress of a serious illness. The goal is to improve quality of life for both the patient and the family.  We discussed a brief life review of the patient. Worked as a Games developer. Lives at home with Marvin Lewis, wife of 23 years. They have two children and two grandchildren with twin grandchildren due in March. Prior to hospitalization, patient able to ambulate and perform ADL's with minimal assist from wife. Good appetite. Diagnosed with COPD many years ago and followed by Dr. Vella Kohler. Home HS oxygen but has needed to wear oxygen throughout the day for the past two weeks. Also followed by cardiology outpatient  for CHF and afib. Takes lasix as home as needed for BLE swelling (wife monitors swelling and checks his weight). Patient and wife share his extensive medical history including trach/feeding tube 5 years ago. They speak of him having "many lives."   Discussed hospital diagnoses and interventions. Patient tells me breathing is much better today and back to baseline 4L [place]. Explained increased oxygen requirement indicating disease progression. Educated on natural trajectory of COPD and CHF. Marvin Lewis and Marvin Lewis understand the chronic, progressive nature of these diseases.   Advanced directives, concepts specific to code status, and artifical feeding and hydration were discussed. Patient has a documented living will and HCPOA that were reviewed with him and wife. He tells me he would NOT want feeding tube again but would be open to intubation/trach again if necessary. He trusts Marvin Lewis will make proper decisions for him he is was terminally ill and unable to make his own decisions.   I asked Marvin Lewis what he is hopeful for? He is hopeful to live another decade and watch his grandchildren grow--especially the twins arriving in March. He feels he has a good quality of life.   Palliative Care services outpatient were explained and offered. Questions and concerns addressed.    SUMMARY OF RECOMMENDATIONS    FULL code/ FULL  scope  Advanced directive/living will documentation scanned into EPIC.   May benefit from outpatient palliative now or in future with multiple co-morbidities and chronic, progressive diseases.  Code Status/Advance Care Planning:  Full code  Symptom Management:   Per attending  Palliative Prophylaxis:   Bowel Regimen, Delirium Protocol, Frequent Pain Assessment and Oral Care  Psycho-social/Spiritual:   Desire for further Chaplaincy support: no  Additional Recommendations: Caregiving  Support/Resources  Prognosis:   Unable to determine  Discharge Planning: Home with  Home Health      Primary Diagnoses: Present on Admission: . Acute respiratory failure ([place])   I have reviewed the medical record, interviewed the patient and family, and examined the patient. The following aspects are pertinent.  Past Medical History:  Diagnosis Date  . A-fib ([place])   . Alcohol abuse   . Alcohol use   . Aortic aneurysm ([place]) 06/19/2017   Ascending 4.4 cm, CT scan July 2018  . Aortic aneurysm, intrathoracic ([place])   . Ascending aortic aneurysm ([place])   . Ascending aortic aneurysm ([place])   . Atrial fibrillation ([place]) 05/08/2016  . Broken rib April 2016   History of broken ribs x 3  . Bronchitis   . CAD (coronary artery disease)   . CHF (congestive heart failure) ([place])   . Chronic alcoholism ([place])   . Cirrhosis ([place])   . Closed nondisplaced fracture of fifth metatarsal bone of left foot Nov. 2016   left  . COPD (chronic obstructive pulmonary disease) ([place])   . Depression   . Diabetes mellitus without complication ([place])   . Emphysema of lung ([place])   . Esophageal rupture   . GERD (gastroesophageal reflux disease)   . Hepatitis C   . Hip arthritis   . History of diverticulitis    ruptured and sepsis  . History of hiatal hernia   . History of smoking   . HOH (hard of hearing)   . Hyperlipidemia   . Hypertension   . Hypoxemia   . Insomnia   . Movement disorder   . Onychogryphosis   . Pneumonia   . Rib fracture   . Seasonal allergies   . Shortness of breath dyspnea   . Sleep apnea    uses O2 @2  L/min at night  . Stroke ([place])   . Thrombocytopenia [facility])    Social History   Social History  . Marital status: Married    Spouse name: N/A  . Number of children: N/A  . Years of education: N/A   Occupational History  . retired    Social History Main Topics  . Smoking status: Current Every Day Smoker    Packs/day: 0.25    Years: 40.00    Types: Cigarettes  . Smokeless tobacco: Never Used  . Alcohol use Yes     Comment: Per wife, pt is alcoholic  .  Drug use: No  . Sexual activity: Yes   Other Topics Concern  . None   Social History Narrative  . None   Family History  Problem Relation Age of Onset  . Coronary artery disease Mother   . Hypertension Mother   . Heart disease Mother   . Lung disease Mother   . Coronary artery disease Father   . Hypertension Father   . Diabetes Father   . Heart disease Father   . Cancer Father        prostate  . Alcohol abuse Brother    Scheduled Meds: . amLODipine  5 mg  Oral Daily  . atorvastatin  10 mg Oral QHS  . benazepril  10 mg Oral Daily  . folic acid  1 mg Oral Daily  . furosemide  20 mg Intravenous Q12H  . furosemide  20 mg Intravenous Once  . Influenza vac split quadrivalent PF  0.5 mL Intramuscular Tomorrow-1000  . insulin aspart  0-20 Units Subcutaneous TID WC  . insulin aspart  0-5 Units Subcutaneous QHS  . ipratropium-albuterol  3 mL Nebulization Q6H  . mouth rinse  15 mL Mouth Rinse BID  . Melatonin  10 mg Oral QHS  . methylPREDNISolone (SOLU-MEDROL) injection  40 mg Intravenous Q12H  . multivitamin with minerals  1 tablet Oral Daily  . nicotine  21 mg Transdermal Daily  . pantoprazole (PROTONIX) IV  40 mg Intravenous Q12H  . sodium chloride flush  3 mL Intravenous Q12H  . sotalol  80 mg Oral BID  . thiamine injection  100 mg Intravenous Daily   Continuous Infusions: . sodium chloride    . sodium chloride    . sodium chloride    . magnesium sulfate 1 - 4 g bolus IVPB     PRN Meds:.sodium chloride, acetaminophen, guaiFENesin-dextromethorphan, LORazepam, ondansetron (ZOFRAN) IV, oxyCODONE-acetaminophen, sodium chloride flush Medications Prior to Admission:  Prior to Admission medications   Medication Sig Start Date End Date Taking? Authorizing Provider  amLODipine (NORVASC) 5 MG tablet Take 5 mg by mouth daily. 06/13/17  Yes [provider]  atorvastatin (LIPITOR) 10 MG tablet Take 1 tablet (10 mg total) by mouth at bedtime. 11/09/16  Yes Lada, Satira Anis, MD    B-D INS SYRINGE 0.5CC/30GX1/2" 30G X 1/2" 0.5 ML MISC AS DIRECTED. 07/17/15  Yes Crissman, Jeannette How, MD  benazepril (LOTENSIN) 10 MG tablet Take 10 mg by mouth daily.    Yes [provider]  fluticasone-salmeterol (ADVAIR HFA) 115-21 MCG/ACT inhaler Inhale 2 puffs into the lungs every 12 (twelve) hours.   Yes [provider]  furosemide (LASIX) 20 MG tablet Take 20-40 mg by mouth daily.    Yes [provider]  insulin aspart (NOVOLOG) 100 UNIT/ML FlexPen Sliding scale Sub-Q for FSBS 200-249 give 3 units, 250-299 give 5 units, 300-349 give 7 units, 350-399 give 9 units, 400+ give 12 units Patient taking differently: Inject 12-20 Units into the skin 3 (three) times daily with meals. 12 units every morning at breakfast, 12 units every day at lunch, and 20 units every day at supper 05/22/16  Yes Lada, Satira Anis, MD  ipratropium-albuterol (DUONEB) 0.5-2.5 (3) MG/3ML SOLN Take 3 mLs by nebulization 4 (four) times daily as needed. For shortness of breath/wheezing   Yes [provider]  omeprazole (PRILOSEC) 20 MG capsule Take 1 capsule (20 mg total) by mouth daily. Caution:prolonged use may increase risk of pneumonia, colitis, osteoporosis, anemia 05/15/16  Yes Lada, Satira Anis, MD  sotalol (BETAPACE) 80 MG tablet Take 1 tablet (80 mg total) by mouth 2 (two) times daily. 05/22/16  Yes FathJavier Docker, MD  TRESIBA FLEXTOUCH 200 UNIT/ML SOPN Inject 48 Units into the skin at bedtime.    Yes [provider]  warfarin (COUMADIN) 2 MG tablet Take 4 mg by mouth daily.   Yes [provider]  zolpidem (AMBIEN) 10 MG tablet Take 10 mg by mouth at bedtime as needed for sleep.    Yes [provider]   No Known Allergies Review of Systems  Respiratory: Positive for cough and shortness of breath.    Physical Exam  Constitutional: He is oriented to person, place, and time. He is cooperative.  HENT:  Head: Normocephalic and atraumatic.  Cardiovascular: Regular  rhythm.   Pulmonary/Chest: Effort normal.  4L [place]  Abdominal: Normal appearance.  Neurological: He is alert and oriented to person, place, and time.  Skin: Skin is warm and dry.  Psychiatric: He has a normal mood and affect. His speech is normal and behavior is normal. Cognition and memory are normal.  Nursing note and vitals reviewed.  Vital Signs: BP 136/87   Pulse 66   Temp 98 F (36.7 C) (Oral)   Resp 18   Ht 5' 11"  (1.803 m)   Wt 104.3 kg (230 lb)   SpO2 96%   BMI 32.08 kg/m  Pain Assessment: 0-10   Pain Score: 0-No pain  SpO2: SpO2: 96 % O2 Device:SpO2: 96 % O2 Flow Rate: .O2 Flow Rate (L/min): 2 L/min  IO: Intake/output summary:   Intake/Output Summary (Last 24 hours) at 09/04/17 1629 Last data filed at 09/04/17 1000  Gross per 24 hour  Intake           200.28 ml  Output              550 ml  Net          -349.72 ml    LBM: Last BM Date: 09/03/17 Baseline Weight: Weight: 104.3 kg (230 lb) Most recent weight: Weight: 104.3 kg (230 lb)     Palliative Assessment/Data: PPS 60%   Flowsheet Rows     Most Recent Value  Intake Tab  Referral Department  Critical care  Unit at Time of Referral  ICU  Palliative Care Primary Diagnosis  Other (Comment) [CHF/COPD exacerbation, multiple co-morbidities]  Palliative Care Type  New Palliative care  Reason for referral  Clarify Goals of Care  Date first seen by Palliative Care  09/04/17  Clinical Assessment  Palliative Performance Scale Score  60%  Psychosocial & Spiritual Assessment  Palliative Care Outcomes  Patient/Family meeting held?  Yes  Who was at the meeting?  patient and wife  Palliative Care Outcomes  Clarified goals of care, ACP counseling assistance, Provided psychosocial or spiritual support, Linked to palliative care logitudinal support      Time In: 1430 Time Out: 1540 Time Total: 45mn Greater than 50%  of this time was spent counseling and coordinating care related to the above assessment and  plan.  Signed by:  MIhor Dow FNP-C Palliative Medicine Team  Phone: [phone]: [phone]  Please contact Palliative Medicine Team phone at [phone] questions and concerns.  For individual provider: See AShea Evans

## 2017-09-04 NOTE — Progress Notes (Signed)
[place] [facility] [place] NAME: Marvin Lewis    MR#:  [id number]  DATE OF BIRTH:  August 13, 1951  SUBJECTIVE:  CHIEF COMPLAINT:    REVIEW OF SYSTEMS:  CONSTITUTIONAL: No fever, fatigue or weakness.  EYES: No blurred or double vision.  EARS, NOSE, AND THROAT: No tinnitus or ear pain.  RESPIRATORY: No cough, shortness of breath, wheezing or hemoptysis.  CARDIOVASCULAR: No chest pain, orthopnea, edema.  GASTROINTESTINAL: No nausea, vomiting, diarrhea or abdominal pain.  GENITOURINARY: No dysuria, hematuria.  ENDOCRINE: No polyuria, nocturia,  HEMATOLOGY: No anemia, easy bruising or bleeding SKIN: No rash or lesion. MUSCULOSKELETAL: No joint pain or arthritis.   NEUROLOGIC: No tingling, numbness, weakness.  PSYCHIATRY: No anxiety or depression.   DRUG ALLERGIES:  No Known Allergies  VITALS:  Blood pressure (!) 110/55, pulse 60, temperature (!) 97.5 F (36.4 C), temperature source Oral, resp. rate 20, height 5\' 11"  (1.803 m), weight 104.3 kg (230 lb), SpO2 100 %.  PHYSICAL EXAMINATION:  GENERAL:  66 y.o.-year-old patient lying in the bed with no acute distress.  EYES: Pupils equal, round, reactive to light and accommodation. No scleral icterus. Extraocular muscles intact.  HEENT: Head atraumatic, normocephalic. Oropharynx and nasopharynx clear.  NECK:  Supple, no jugular venous distention. No thyroid enlargement, no tenderness.  LUNGS: Normal breath sounds bilaterally, no wheezing, rales,rhonchi or crepitation. No use of accessory muscles of respiration.  CARDIOVASCULAR: S1, S2 normal. No murmurs, rubs, or gallops.  ABDOMEN: Soft, nontender, nondistended. Bowel sounds present. No organomegaly or mass.  EXTREMITIES: No pedal edema, cyanosis, or clubbing.  NEUROLOGIC: Cranial nerves II through XII are intact. Muscle strength 5/5 in all extremities. Sensation intact. Gait not checked.  PSYCHIATRIC: The patient is alert and oriented x 3.   SKIN: No obvious rash, lesion, or ulcer.    LABORATORY PANEL:   CBC  Recent Labs Lab 09/04/17 0420 09/04/17 0619  WBC 4.1  --   HGB 6.8* 7.3*  HCT 24.3* 25.4*  PLT 118*  --    ------------------------------------------------------------------------------------------------------------------  Chemistries   Recent Labs Lab 09/03/17 0810 09/04/17 0420  NA 136 135  K 4.9 5.0  CL 97* 95*  CO2 29 33*  GLUCOSE 257* 243*  BUN 21* 30*  CREATININE 0.90 1.00  CALCIUM 8.6* 8.4*  MG  --  1.6*  AST 48*  --   ALT 49  --   ALKPHOS 80  --   BILITOT 1.0  --    ------------------------------------------------------------------------------------------------------------------  Cardiac Enzymes  Recent Labs Lab 09/04/17 1152  TROPONINI 0.19*   ------------------------------------------------------------------------------------------------------------------  RADIOLOGY:  Dg Chest Port 1 View  Result Date: 09/04/2017 CLINICAL DATA:  Respiratory failure. EXAM: PORTABLE CHEST 1 VIEW COMPARISON:  09/03/2017. FINDINGS: Mediastinum and hilar structures are normal. Cardiomegaly. Normal pulmonary vascularity . Persistent but improved bibasilar infiltrates. Small bilateral pleural effusions. No pneumothorax. IMPRESSION: 1. Persistent but improved bibasilar infiltrates. Small bilateral pleural effusions. 2. Cardiomegaly.  No pulmonary venous congestion . Electronically Signed   By: Marcello Moores  Register   On: 09/04/2017 06:43   Dg Chest Port 1 View  Result Date: 09/03/2017 CLINICAL DATA:  Shortness of breath and decreased oxygen saturation EXAM: PORTABLE CHEST 1 VIEW COMPARISON:  Chest radiograph June 17, 2017 and chest CT June 17, 2017 FINDINGS: There is airspace consolidation in both lower lobes, more on the right than on the left. There is a small right pleural effusion. Lungs elsewhere clear. Heart is mildly enlarged with mild pulmonary venous hypertension. No  adenopathy. No bone lesions.  IMPRESSION: Underlying pulmonary vascular congestion. Airspace consolidation in both lower lobes, more severe on the right than on the left, likely pneumonia. Rather minimal right pleural effusion evident. Followup PA and lateral chest radiographs recommended in 3-4 weeks following trial of antibiotic therapy to ensure resolution and exclude underlying malignancy. Electronically Signed   By: Lowella Grip III M.D.   On: 09/03/2017 08:29    EKG:   Orders placed or performed during the hospital encounter of 06/17/17  . ED EKG within 10 minutes  . ED EKG within 10 minutes  . EKG 12-Lead  . EKG 12-Lead    ASSESSMENT AND PLAN:   Marvin Lewis  is a 66 y.o. male with a known history of chronic atrial fibrillation on Coumadin, CHF, COPD, coronary artery disease, alcohol abuse, on chronic 4 L of oxygenis presenting to the ED with a chief complaint of shortness of breath and cough. Chest x-ray has revealed pulmonary congestion and infiltrates. Patient was intubated in the past and had a tracheostomy. Currently patient is placed on BiPAP, Solu-Medrol was given. IV Lasix and antibiotics were also started and hospitalist team is called to admit the patient. Wife at bedside.  #acute hypoxic respiratory failure secondary to CHF exacerbation,COPD exacerbation and pneumonia Clinically improving, off BiPAP Transferring to floor  IV Lasix and IV steroids and antibiotics  # Acute anemia Stool for occult blood is pending  Blood transfusion today and monitor hemoglobin and hematocrit Protonix and GI consult Hemoglobin dropped from 8.5 to 6.8- 7.3 Hold Coumadin   #Acute CHF exacerbation IV Lasix, daily weight monitoring and intake and output Echocardiogram-Results are pending  Continue BiPAP and wean off as tolerated [facility] cardiology following   #Pneumonia IV Rocephin and azithromycin and sputum culture and sensitivity file can obtain sputum sample  #Acute COPD IV steroids and breathing  treatments On antibiotics as well  #Chronic history of atrial fibrillation currently rate controlled  hold Coumadin in view of acute anemia   GI consult   #Recent left wrist fracture continue present pain management as needed      All the records are reviewed and case discussed with Care Management/Social Workerr. Management plans discussed with the patient, family and they are in agreement.  CODE STATUS: fc   TOTAL TIME TAKING CARE OF THIS PATIENT: 36 minutes.   POSSIBLE D/C IN 1-2  DAYS, DEPENDING ON CLINICAL CONDITION.  Note: This dictation was prepared with Dragon dictation along with smaller phrase technology. Any transcriptional errors that result from this process are unintentional.   Nicholes Mango M.D on 09/04/2017 at 3:16 PM  Between 7am to 6pm - Pager - [phone] After 6pm go to [email or web address] - password [email or web address] [facility]  [facility]  Office  [phone]  CC: Primary care physician; Laverle Hobby, MD

## 2017-09-04 NOTE — Progress Notes (Signed)
Pt's wife came to ask for help getting patient untangled from cords. Upon entering the room this nurse found that the wife had attempted to transfer the patient to the bedside recliner. The patient had pulled leads off and was tangled up . Patient and family educated on importance of using the call light to ensure patient safety. Educated on risk of falling with the wires and medications the patient is receiving.   Pt then insisted that we call the IV team to check the IV placement stating "It does not feel like it is in the vein." House supervisor notified for assistance.  Will continue to assess.

## 2017-09-04 NOTE — Progress Notes (Signed)
SLP Cancellation Note  Patient Details Name: Marvin Lewis MRN: [id number] DOB: 10-10-1951   Cancelled treatment:       Reason Eval/Treat Not Completed: Patient declined, no reason specified (discussed w/ pt who declined need for evaluation) Chart reviewed; consulted NSG then met w/ pt/family in room. Pt remembered this SLP from previous encounters regarding his swallowing and voice. He immediately stated he did not "need services". He stated he "knew" his swallowing and that he had "problems w/ it sometimes - more when I eat particulate, crumbly foods". He stated he was "careful" w/ his swallowing but that he knew he would "never have another feeding tube again" after he "got the other one out". Pt stated he was "ok" eating meals and even stated he swallowed thin liquids "much better". Pt was able to describe general aspiration precautions he follows to "help out my swallowing".  Reviewed chart notes, wbc not elevated and CXR which revealed "persistent but improved bibasilar infiltrates". Pt is currently on a regular diet per MD order at admission. He has been swallowing pills w/ NSG (using water) w/ no reported deficits.  ST services will be available for further assessment and education if indicated and desired by pt while admitted; NSG updated and agreed. Noted a Palliative Care consult in chart and updated NP as well.    Orinda Kenner, MS, CCC-SLP Watson,Katherine 09/04/2017, 2:15 PM

## 2017-09-04 NOTE — Progress Notes (Signed)
Pt transported to room 107 in stable condition. Maddie, RN at bedside. All belongings with patient.   When preparing the patient to be transferred this RN found the patient hiding 1/2 of a Percocet. The Percocet was taken from the patient and asked when he had hid the medication, he stated "earlier today". He would not be more specific. Maddie, RN was notified of patient hiding Percocet.

## 2017-09-04 NOTE — Progress Notes (Signed)
Notified by CCMD that patient had a 7 beat run of V-Tach. MD notified, magnesium ordered. Pt asymptomatic, VSS. Ammie Dalton, RN

## 2017-09-04 NOTE — Consult Note (Signed)
[facility] Card[facility]  CARDIOLOGY CONSULT NOTE  Patient ID: Marvin Lewis MRN: [id number] DOB/AGE: Jul 08, 1951 66 y.o.  Admit date: 09/03/2017 Referring Physician Gouru Primary Physician  Primary Cardiologist Fath Reason for Consultation acute on chronic diastolic CHF  HPI: 66 year old male referred for acute on chronic diastolic heart failure. The patient has a history of persistent atrial fibrillation on warfarin, status post cardioversion, coronary artery disease per functional study, hypertension, hyperlipidemia, type 2 diabetes, diastolic CHF, AAA, COPD on chronic 4 L of oxygen, several previous intubations as well as a tracheostomy for failure to wean, and alcohol abuse. The patient reports significant shortness of breath yesterday morning upon waking, which he likened to prior COPD exacerbations. He did a nebulizer treatment, which typically improves his symptoms, but he did not have relief after the treatment. The patient also reports peripheral edema for the last week, for which he had to take additional Lasix for 2 days. His wife brought him to [facility] ER where he was noted to be in acute respiratory distress, was placed on BiPAP, and received IV Lasix, Solu-Medrol, and breathing treatments. The patient responded well to these treatments. Chest x-ray revealed pulmonary congestion, consolidation in both lower lobes, likely pneumonia, and right pleural effusion. ECG revealed atrial fibrillation at a rate of 106 bpm with nonspecific T wave abnormalities. Admission labs notable for borderline elevated troponin of 0.06, 0.35, 0.40, followed by 0.39. INR 2.16, hemoglobin 8.5, hematocrit 30, followed by hemoglobin 6.8 and hematocrit 24.3, platelets 175, followed by 118. Repeat hemoglobin and hematocrit this morning were 7.3 and 25.4, respectively. An order for blood transfusion has been placed. 2-D echocardiogram on 03/29/16 revealed normal left ventricular function with LVEF 50%. Currently, the patient  reports feeling better than yesterday. He is currently on oxygen via nasal cannula. The patient reports right-sided chest discomfort that has remained constant, worse with inspiration and coughing.   Review of systems complete and found to be negative unless listed above     Past Medical History:  Diagnosis Date  . A-fib ([place])   . Alcohol abuse   . Alcohol use   . Aortic aneurysm ([place]) 06/19/2017   Ascending 4.4 cm, CT scan July 2018  . Aortic aneurysm, intrathoracic ([place])   . Ascending aortic aneurysm ([place])   . Ascending aortic aneurysm ([place])   . Atrial fibrillation ([place]) 05/08/2016  . Broken rib April 2016   History of broken ribs x 3  . Bronchitis   . CAD (coronary artery disease)   . CHF (congestive heart failure) ([place])   . Chronic alcoholism ([place])   . Cirrhosis ([place])   . Closed nondisplaced fracture of fifth metatarsal bone of left foot Nov. 2016   left  . COPD (chronic obstructive pulmonary disease) ([place])   . Depression   . Diabetes mellitus without complication ([place])   . Emphysema of lung ([place])   . Esophageal rupture   . GERD (gastroesophageal reflux disease)   . Hepatitis C   . Hip arthritis   . History of diverticulitis    ruptured and sepsis  . History of hiatal hernia   . History of smoking   . HOH (hard of hearing)   . Hyperlipidemia   . Hypertension   . Hypoxemia   . Insomnia   . Movement disorder   . Onychogryphosis   . Pneumonia   . Rib fracture   . Seasonal allergies   . Shortness of breath dyspnea   . Sleep apnea    uses O2 @2   L/min at night  . Stroke ([place])   . Thrombocytopenia ([place])     Past Surgical History:  Procedure Laterality Date  . CATARACT EXTRACTION W/PHACO Right 01/16/2016   Procedure: CATARACT EXTRACTION PHACO AND INTRAOCULAR LENS PLACEMENT (IOC);  Surgeon: Birder Robson, MD;  Location: [facility] ORS;  Service: Ophthalmology;  Laterality: Right;  [place]: 00:45.0 AP%: 25.2 CDE: 11.36  Lot #[id number]  . CHOLECYSTECTOMY    . COLON  RESECTION    . COLONOSCOPY WITH PROPOFOL N/A 08/27/2016   Procedure: COLONOSCOPY WITH PROPOFOL;  Surgeon: Lollie Sails, MD;  Location: [facility] ENDOSCOPY;  Service: Endoscopy;  Laterality: N/A;  . ELECTROPHYSIOLOGIC STUDY N/A 05/22/2016   Procedure: CARDIOVERSION;  Surgeon: Teodoro Spray, MD;  Location: [facility] ORS;  Service: Cardiovascular;  Laterality: N/A;  . HERNIA REPAIR    . PEG PLACEMENT N/A   . PEG TUBE REMOVAL N/A   . PILONIDAL CYST EXCISION N/A   . TRACHEOSTOMY N/A     Prescriptions Prior to Admission  Medication Sig Dispense Refill Last Dose  . amLODipine (NORVASC) 5 MG tablet Take 5 mg by mouth daily.   09/03/2017 at am  . atorvastatin (LIPITOR) 10 MG tablet Take 1 tablet (10 mg total) by mouth at bedtime. 7 tablet 0 09/02/2017 at pm  . B-D INS SYRINGE 0.5CC/30GX1/2" 30G X 1/2" 0.5 ML MISC AS DIRECTED. 100 each 12 Taking  . benazepril (LOTENSIN) 10 MG tablet Take 10 mg by mouth daily.    09/03/2017 at am  . fluticasone-salmeterol (ADVAIR HFA) 115-21 MCG/ACT inhaler Inhale 2 puffs into the lungs every 12 (twelve) hours.   09/03/2017 at am  . furosemide (LASIX) 20 MG tablet Take 20-40 mg by mouth daily.    09/03/2017 at am  . insulin aspart (NOVOLOG) 100 UNIT/ML FlexPen Sliding scale Sub-Q for FSBS 200-249 give 3 units, 250-299 give 5 units, 300-349 give 7 units, 350-399 give 9 units, 400+ give 12 units (Patient taking differently: Inject 12-20 Units into the skin 3 (three) times daily with meals. 12 units every morning at breakfast, 12 units every day at lunch, and 20 units every day at supper) 15 mL 1 09/02/2017 at pm  . ipratropium-albuterol (DUONEB) 0.5-2.5 (3) MG/3ML SOLN Take 3 mLs by nebulization 4 (four) times daily as needed. For shortness of breath/wheezing   PRN at PRN  . omeprazole (PRILOSEC) 20 MG capsule Take 1 capsule (20 mg total) by mouth daily. Caution:prolonged use may increase risk of pneumonia, colitis, osteoporosis, anemia 30 capsule 1 09/02/2017 at am  . sotalol  (BETAPACE) 80 MG tablet Take 1 tablet (80 mg total) by mouth 2 (two) times daily. 60 tablet 11 09/03/2017 at 0700  . TRESIBA FLEXTOUCH 200 UNIT/ML SOPN Inject 48 Units into the skin at bedtime.    09/02/2017 at pm  . warfarin (COUMADIN) 3 MG tablet Take 1 tablet (3 mg total) by mouth daily. (Patient taking differently: Take 3 mg by mouth at bedtime. ) 30 tablet 0 09/02/2017 at 2200  . zolpidem (AMBIEN) 10 MG tablet Take 10 mg by mouth at bedtime as needed for sleep.    PRN at PRN   Social History   Social History  . Marital status: Married    Spouse name: N/A  . Number of children: N/A  . Years of education: N/A   Occupational History  . retired    Social History Main Topics  . Smoking status: Current Every Day Smoker    Packs/day: 0.25    Years: 40.00  Types: Cigarettes  . Smokeless tobacco: Never Used  . Alcohol use Yes     Comment: Per wife, pt is alcoholic  . Drug use: No  . Sexual activity: Yes   Other Topics Concern  . Not on file   Social History Narrative  . No narrative on file    Family History  Problem Relation Age of Onset  . Coronary artery disease Mother   . Hypertension Mother   . Heart disease Mother   . Lung disease Mother   . Coronary artery disease Father   . Hypertension Father   . Diabetes Father   . Heart disease Father   . Cancer Father        prostate  . Alcohol abuse Brother       Review of systems complete and found to be negative unless listed above      PHYSICAL EXAM  General: Chronically ill-appearing, sitting upright in bed with family at bedside, oxygen via nasal cannula, in no acute distress HEENT:  Normocephalic and atramatic Neck:  No JVD.  Lungs: Decreased breath sounds in bilateral lower lobes, expiratory wheezing. Normal effort of breathing on supplemental oxygen Heart: HRRR . Normal S1 and S2 without gallops or murmurs.  Abdomen: Bowel sounds are positive, abdomen soft and non-tender  Msk:  Back normal, patient sitting  upright in bed. Strength and tone normal for age Extremities: 1+  Bilateral lower extremity edema.   Neuro: Alert and oriented X 3. Psych:  Good affect, responds appropriately  Labs:   Lab Results  Component Value Date   WBC 4.1 09/04/2017   HGB 7.3 (L) 09/04/2017   HCT 25.4 (L) 09/04/2017   MCV 69.0 (L) 09/04/2017   PLT 118 (L) 09/04/2017    Recent Labs Lab 09/03/17 0810 09/04/17 0420  NA 136 135  K 4.9 5.0  CL 97* 95*  CO2 29 33*  BUN 21* 30*  CREATININE 0.90 1.00  CALCIUM 8.6* 8.4*  PROT 6.9  --   BILITOT 1.0  --   ALKPHOS 80  --   ALT 49  --   AST 48*  --   GLUCOSE 257* 243*   Lab Results  Component Value Date   CKTOTAL 126 01/04/2012   CKMB 9.9 (H) 01/03/2012   TROPONINI 0.39 (HH) 09/03/2017    Lab Results  Component Value Date   CHOL 139 04/23/2016   CHOL 159 01/10/2016   CHOL 148 09/27/2015   Lab Results  Component Value Date   HDL 46 04/23/2016   HDL 37 (L) 01/10/2016   Lab Results  Component Value Date   LDLCALC 65 04/23/2016   LDLCALC 75 01/10/2016   Lab Results  Component Value Date   TRIG 138 04/23/2016   TRIG 234 (H) 01/10/2016   No results found for: CHOLHDL No results found for: LDLDIRECT    Radiology: Dg Chest Port 1 View  Result Date: 09/04/2017 CLINICAL DATA:  Respiratory failure. EXAM: PORTABLE CHEST 1 VIEW COMPARISON:  09/03/2017. FINDINGS: Mediastinum and hilar structures are normal. Cardiomegaly. Normal pulmonary vascularity . Persistent but improved bibasilar infiltrates. Small bilateral pleural effusions. No pneumothorax. IMPRESSION: 1. Persistent but improved bibasilar infiltrates. Small bilateral pleural effusions. 2. Cardiomegaly.  No pulmonary venous congestion . Electronically Signed   By: Marcello Moores  Register   On: 09/04/2017 06:43   Dg Chest Port 1 View  Result Date: 09/03/2017 CLINICAL DATA:  Shortness of breath and decreased oxygen saturation EXAM: PORTABLE CHEST 1 VIEW COMPARISON:  Chest radiograph June 17, 2017 and  chest CT June 17, 2017 FINDINGS: There is airspace consolidation in both lower lobes, more on the right than on the left. There is a small right pleural effusion. Lungs elsewhere clear. Heart is mildly enlarged with mild pulmonary venous hypertension. No adenopathy. No bone lesions. IMPRESSION: Underlying pulmonary vascular congestion. Airspace consolidation in both lower lobes, more severe on the right than on the left, likely pneumonia. Rather minimal right pleural effusion evident. Followup PA and lateral chest radiographs recommended in 3-4 weeks following trial of antibiotic therapy to ensure resolution and exclude underlying malignancy. Electronically Signed   By: Lowella Grip III M.D.   On: 09/03/2017 08:29    EKG: Sinus rhythm, rate 65 bpm  ASSESSMENT AND PLAN:  1. Acute hypoxic respiratory failure secondary to acute on chronic diastolic heart failure, COPD constipation, and pneumonia, received BiPAP, IV Lasix, IV steroids, and antibiotics. Reports breathing better this morning. 2. Chronic atrial fibrillation, on warfarin and sotalol, currently in sinus rhythm. 3. Acute blood loss, hemoglobin and hematocrit this morning were 7.3 and 25.4, respectively; hemoccult card pending, blood transfusion ordered. 4. Borderline elevated troponin of 0.06, 0.35, 0.40, followed by 0.39, with nonspecific T wave abnormalities on ECG. Patient with pleuritic, constant chest discomfort. Will continue to cycle cardiac enzymes. 5. Essential hypertension 6. COPD on chronic 4 L oxygen 7. Hyperlipidemia 8. Type 2 diabetes  Recommendations: 1. Agree with overall therapy. 2. Review 2D echocardiogram 3. Continue IV Lasix with careful monitoring of renal status 4. Continue to cycle cardiac enzymes. 5. Recommend holding Warfarin in the setting of acute blood loss  Signed: Clabe Seal, PA-C 09/04/2017, 9:24 AM

## 2017-09-04 NOTE — Progress Notes (Signed)
When this nurse entered patients room. Pt observed to have blood on the floor and his IV pulled out. Precedex drip held per discussion with Dr.Ramachandran. Pt is drowsy but is ease to arouse. Pt oriented X4. Will continue to assess.

## 2017-09-04 NOTE — Progress Notes (Signed)
Attempted to call report left ascom number for return call.

## 2017-09-05 DIAGNOSIS — D509 Iron deficiency anemia, unspecified: Secondary | ICD-10-CM

## 2017-09-05 LAB — CBC
HEMATOCRIT: 29.3 % — AB (ref 40.0–52.0)
HEMOGLOBIN: 8.7 g/dL — AB (ref 13.0–18.0)
MCH: 20.7 pg — ABNORMAL LOW (ref 26.0–34.0)
MCHC: 29.6 g/dL — ABNORMAL LOW (ref 32.0–36.0)
MCV: 70.1 fL — ABNORMAL LOW (ref 80.0–100.0)
Platelets: 163 10*3/uL (ref 150–440)
RBC: 4.18 MIL/uL — AB (ref 4.40–5.90)
RDW: 18.9 % — AB (ref 11.5–14.5)
WBC: 10.3 10*3/uL (ref 3.8–10.6)

## 2017-09-05 LAB — TYPE AND SCREEN
ABO/RH(D): O POS
ANTIBODY SCREEN: NEGATIVE
Unit division: 0

## 2017-09-05 LAB — BASIC METABOLIC PANEL
ANION GAP: 11 (ref 5–15)
BUN: 44 mg/dL — ABNORMAL HIGH (ref 6–20)
CHLORIDE: 92 mmol/L — AB (ref 101–111)
CO2: 29 mmol/L (ref 22–32)
CREATININE: 1.14 mg/dL (ref 0.61–1.24)
Calcium: 8.8 mg/dL — ABNORMAL LOW (ref 8.9–10.3)
GFR calc non Af Amer: >60 mL/min (ref >60)
Glucose, Bld: 333 mg/dL — ABNORMAL HIGH (ref 65–99)
POTASSIUM: 4.7 mmol/L (ref 3.5–5.1)
SODIUM: 132 mmol/L — AB (ref 135–145)

## 2017-09-05 LAB — BPAM RBC
BLOOD PRODUCT EXPIRATION DATE: 201810012359
ISSUE DATE / TIME: 201809272323
Unit Type and Rh: 9500

## 2017-09-05 LAB — IRON AND TIBC
IRON: 93 ug/dL (ref 45–182)
Saturation Ratios: 16 % — ABNORMAL LOW (ref 17.9–39.5)
TIBC: 576 ug/dL — AB (ref 250–450)
UIBC: 483 ug/dL

## 2017-09-05 LAB — PROTIME-INR
INR: 1.73
Prothrombin Time: 20.1 seconds — ABNORMAL HIGH (ref 11.4–15.2)

## 2017-09-05 LAB — FERRITIN: FERRITIN: 18 ng/mL — AB (ref 24–336)

## 2017-09-05 LAB — GLUCOSE, CAPILLARY
GLUCOSE-CAPILLARY: 356 mg/dL — AB (ref 65–99)
Glucose-Capillary: 256 mg/dL — ABNORMAL HIGH (ref 65–99)

## 2017-09-05 LAB — ECHOCARDIOGRAM COMPLETE
Height: 71 in
WEIGHTICAEL: 3834.24 [oz_av]

## 2017-09-05 LAB — PROCALCITONIN

## 2017-09-05 LAB — VITAMIN B12: Vitamin B-12: 577 pg/mL (ref 180–914)

## 2017-09-05 LAB — FOLATE: Folate: 15.3 ng/mL (ref >5.9)

## 2017-09-05 LAB — MAGNESIUM: MAGNESIUM: 1.9 mg/dL (ref 1.7–2.4)

## 2017-09-05 MED ORDER — OXYCODONE HCL 5 MG PO TABS: 5.0000 mg | ORAL_TABLET | Freq: Four times a day (QID) | ORAL | Status: DC | PRN

## 2017-09-05 MED ORDER — FOLIC ACID 1 MG PO TABS: 1.0000 mg | ORAL_TABLET | Freq: Every day | ORAL | Status: DC

## 2017-09-05 MED ORDER — ADULT MULTIVITAMIN W/MINERALS CH: 1.0000 | ORAL_TABLET | Freq: Every day | ORAL | Status: DC

## 2017-09-05 MED ORDER — FUROSEMIDE 20 MG PO TABS: 40.0000 mg | ORAL_TABLET | Freq: Every day | ORAL | 0 refills | Status: AC

## 2017-09-05 MED ORDER — ACETAMINOPHEN 325 MG PO TABS: 650.0000 mg | ORAL_TABLET | Freq: Four times a day (QID) | ORAL | Status: DC | PRN

## 2017-09-05 MED ORDER — PREDNISONE 10 MG (21) PO TBPK: 10.0000 mg | ORAL_TABLET | Freq: Every day | ORAL | 0 refills | Status: DC

## 2017-09-05 MED ORDER — ALBUTEROL SULFATE HFA 108 (90 BASE) MCG/ACT IN AERS: 2.0000 | INHALATION_SPRAY | Freq: Four times a day (QID) | RESPIRATORY_TRACT | 1 refills | Status: DC | PRN

## 2017-09-05 MED ORDER — FERROUS SULFATE 325 (65 FE) MG PO TABS: 325.0000 mg | ORAL_TABLET | Freq: Two times a day (BID) | ORAL | 1 refills | Status: DC

## 2017-09-05 MED ORDER — GUAIFENESIN-DM 100-10 MG/5ML PO SYRP: 10.0000 mL | ORAL_SOLUTION | Freq: Four times a day (QID) | ORAL | 0 refills | Status: DC | PRN

## 2017-09-05 MED ORDER — DOCUSATE SODIUM 100 MG PO CAPS: 100.0000 mg | ORAL_CAPSULE | Freq: Two times a day (BID) | ORAL | 0 refills | Status: DC

## 2017-09-05 MED ORDER — PANTOPRAZOLE SODIUM 40 MG PO TBEC: 40.0000 mg | DELAYED_RELEASE_TABLET | Freq: Every day | ORAL | 1 refills | Status: DC

## 2017-09-05 MED ORDER — NICOTINE 21 MG/24HR TD PT24: 21.0000 mg | MEDICATED_PATCH | Freq: Every day | TRANSDERMAL | 0 refills | Status: DC

## 2017-09-05 MED ORDER — OXYCODONE HCL 5 MG PO TABS: 5.0000 mg | ORAL_TABLET | Freq: Four times a day (QID) | ORAL | 0 refills | Status: DC | PRN

## 2017-09-05 MED ORDER — AMOXICILLIN-POT CLAVULANATE 500-125 MG PO TABS: 1.0000 | ORAL_TABLET | Freq: Three times a day (TID) | ORAL | 0 refills | Status: DC

## 2017-09-05 NOTE — Consult Note (Addendum)
Marvin Bellows MD, MRCP(U.K) [address]  [place]  [place], [place] [address]  Main: [phone]  Fax: [phone]  Consultation  Referring Provider:     Dr Margaretmary Eddy  Primary Care Physician:  Laverle Hobby, MD Primary Gastroenterologist: None          Reason for Consultation:     Anemia   Date of Admission  09/03/2017 Date of Consultation:  09/05/2017         HPI:   Marvin Lewis is a 66 y.o. male with CHF, A fibb on coumadin , CAD , HTN, on home oxygen , intubated in the past , Trach and PEG , ETOH , liver cirrrhosis, carcinoid of the lung . I have been consulted for anemia,  Hb was 11.1 on 08/27/16 and was 8.5 on 09/03/17 with a MCV of 71. Yesterday dropped to 6.8 and MCV 69. Troponin's have been elevated.  No recent iron studies.    Denies any overt blood loss, stool is brown in color, uses bc powder daily for many years for arm and shoulder pain. Denies any abdominal pain. Last colonoscopy in 2017 , few [polyps resected by Dr Gustavo Lah.   He sees Dr Gustavo Lah for chronic hepatitis C and cirrhosis. H/o alcohol abuse.    Past Medical History:  Diagnosis Date  . A-fib ([place])   . Alcohol abuse   . Alcohol use   . Aortic aneurysm ([place]) 06/19/2017   Ascending 4.4 cm, CT scan July 2018  . Aortic aneurysm, intrathoracic ([place])   . Ascending aortic aneurysm ([place])   . Ascending aortic aneurysm ([place])   . Atrial fibrillation ([place]) 05/08/2016  . Broken rib April 2016   History of broken ribs x 3  . Bronchitis   . CAD (coronary artery disease)   . CHF (congestive heart failure) ([place])   . Chronic alcoholism ([place])   . Cirrhosis ([place])   . Closed nondisplaced fracture of fifth metatarsal bone of left foot Nov. 2016   left  . COPD (chronic obstructive pulmonary disease) ([place])   . Depression   . Diabetes mellitus without complication ([employer])   . Emphysema of lung ([place])   . Esophageal rupture   . GERD (gastroesophageal reflux disease)   . Hepatitis C   . Hip arthritis     . History of diverticulitis    ruptured and sepsis  . History of hiatal hernia   . History of smoking   . HOH (hard of hearing)   . Hyperlipidemia   . Hypertension   . Hypoxemia   . Insomnia   . Movement disorder   . Onychogryphosis   . Pneumonia   . Rib fracture   . Seasonal allergies   . Shortness of breath dyspnea   . Sleep apnea    uses O2 @2  L/min at night  . Stroke ([place])   . Thrombocytopenia ([place])     Past Surgical History:  Procedure Laterality Date  . CATARACT EXTRACTION W/PHACO Right 01/16/2016   Procedure: CATARACT EXTRACTION PHACO AND INTRAOCULAR LENS PLACEMENT (IOC);  Surgeon: Birder Robson, MD;  Location: [facility] ORS;  Service: Ophthalmology;  Laterality: Right;  [place]: 00:45.0 AP%: 25.2 CDE: 11.36  Lot #[id number]  . CHOLECYSTECTOMY    . COLON RESECTION    . COLONOSCOPY WITH PROPOFOL N/A 08/27/2016   Procedure: COLONOSCOPY WITH PROPOFOL;  Surgeon: Lollie Sails, MD;  Location: [facility] ENDOSCOPY;  Service: Endoscopy;  Laterality: N/A;  . ELECTROPHYSIOLOGIC STUDY N/A 05/22/2016   Procedure: CARDIOVERSION;  Surgeon: Teodoro Spray, MD;  Location: [facility] ORS;  Service: Cardiovascular;  Laterality: N/A;  . HERNIA REPAIR    . PEG PLACEMENT N/A   . PEG TUBE REMOVAL N/A   . PILONIDAL CYST EXCISION N/A   . TRACHEOSTOMY N/A     Prior to Admission medications   Medication Sig Start Date End Date Taking? Authorizing Provider  amLODipine (NORVASC) 5 MG tablet Take 5 mg by mouth daily. 06/13/17  Yes [provider]  atorvastatin (LIPITOR) 10 MG tablet Take 1 tablet (10 mg total) by mouth at bedtime. 11/09/16  Yes Lada, Satira Anis, MD  B-D INS SYRINGE 0.5CC/30GX1/2" 30G X 1/2" 0.5 ML MISC AS DIRECTED. 07/17/15  Yes Crissman, Jeannette How, MD  benazepril (LOTENSIN) 10 MG tablet Take 10 mg by mouth daily.    Yes [provider]  fluticasone-salmeterol (ADVAIR HFA) 115-21 MCG/ACT inhaler Inhale 2 puffs into the lungs every 12 (twelve) hours.   Yes [provider]   furosemide (LASIX) 20 MG tablet Take 20-40 mg by mouth daily.    Yes [provider]  insulin aspart (NOVOLOG) 100 UNIT/ML FlexPen Sliding scale Sub-Q for FSBS 200-249 give 3 units, 250-299 give 5 units, 300-349 give 7 units, 350-399 give 9 units, 400+ give 12 units Patient taking differently: Inject 12-20 Units into the skin 3 (three) times daily with meals. 12 units every morning at breakfast, 12 units every day at lunch, and 20 units every day at supper 05/22/16  Yes Lada, Satira Anis, MD  ipratropium-albuterol (DUONEB) 0.5-2.5 (3) MG/3ML SOLN Take 3 mLs by nebulization 4 (four) times daily as needed. For shortness of breath/wheezing   Yes [provider]  omeprazole (PRILOSEC) 20 MG capsule Take 1 capsule (20 mg total) by mouth daily. Caution:prolonged use may increase risk of pneumonia, colitis, osteoporosis, anemia 05/15/16  Yes Lada, Satira Anis, MD  sotalol (BETAPACE) 80 MG tablet Take 1 tablet (80 mg total) by mouth 2 (two) times daily. 05/22/16  Yes FathJavier Docker, MD  TRESIBA FLEXTOUCH 200 UNIT/ML SOPN Inject 48 Units into the skin at bedtime.    Yes [provider]  warfarin (COUMADIN) 2 MG tablet Take 4 mg by mouth daily.   Yes [provider]  zolpidem (AMBIEN) 10 MG tablet Take 10 mg by mouth at bedtime as needed for sleep.    Yes [provider]    Family History  Problem Relation Age of Onset  . Coronary artery disease Mother   . Hypertension Mother   . Heart disease Mother   . Lung disease Mother   . Coronary artery disease Father   . Hypertension Father   . Diabetes Father   . Heart disease Father   . Cancer Father        prostate  . Alcohol abuse Brother      Social History  Substance Use Topics  . Smoking status: Current Every Day Smoker    Packs/day: 0.25    Years: 40.00    Types: Cigarettes  . Smokeless tobacco: Never Used  . Alcohol use Yes     Comment: Per wife, pt is alcoholic    Allergies as of 09/03/2017  . (No  Known Allergies)    Review of Systems:    All systems reviewed and negative except where noted in HPI.   Physical Exam:  Vital signs in last 24 hours: Temp:  [97.8 F (36.6 C)-98.8 F (37.1 C)] 98.4 F (36.9 C) (09/28 0811) Pulse Rate:  [66-88]  81 (09/28 0811) Resp:  [18-22] 18 (09/28 0811) BP: (134-148)/(69-87) 148/84 (09/28 0811) SpO2:  [95 %-97 %] 96 % (09/28 0811) Weight:  [238 lb (108 kg)] 238 lb (108 kg) (09/28 0500) Last BM Date: 09/04/17 General:   Pleasant, cooperative in NAD Head:  Normocephalic and atraumatic. Eyes:   No icterus.   Conjunctiva pink. PERRLA. Ears:  Normal auditory acuity. Neck:  Supple; no masses or thyroidomegaly Lungs: On oxygen , decreased air entry b/l ,  Heart:  Regular rate and rhythm;  Without murmur, clicks, rubs or gallops Abdomen:  Soft, nondistended, nontender. Normal bowel sounds. No appreciable masses or hepatomegaly.  No rebound or guarding.  Rectal:  Not performed. Neurologic:  Alert and oriented x3;  grossly normal neurologically. Skin:  Intact without significant lesions or rashes. Cervical Nodes:  No significant cervical adenopathy. Psych:  Alert and cooperative. Normal affect.  LAB RESULTS:  Recent Labs  09/03/17 0810 09/04/17 0420  09/04/17 1603 09/04/17 2118 09/05/17 0514  WBC 9.0 4.1  --   --   --  10.3  HGB 8.5* 6.8*  < > 7.5* 7.6* 8.7*  HCT 30.0* 24.3*  < > 26.4* 26.8* 29.3*  PLT 175 118*  --   --   --  163  < > = values in this interval not displayed. BMET  Recent Labs  09/03/17 0810 09/04/17 0420 09/05/17 0514  NA 136 135 132*  K 4.9 5.0 4.7  CL 97* 95* 92*  CO2 29 33* 29  GLUCOSE 257* 243* 333*  BUN 21* 30* 44*  CREATININE 0.90 1.00 1.14  CALCIUM 8.6* 8.4* 8.8*   LFT  Recent Labs  09/03/17 0810  PROT 6.9  ALBUMIN 3.5  AST 48*  ALT 49  ALKPHOS 80  BILITOT 1.0   PT/INR  Recent Labs  09/04/17 0420 09/05/17 0514  LABPROT 23.9* 20.1*  INR 2.16 1.73    STUDIES: Dg Chest Port 1  View  Result Date: 09/04/2017 CLINICAL DATA:  Respiratory failure. EXAM: PORTABLE CHEST 1 VIEW COMPARISON:  09/03/2017. FINDINGS: Mediastinum and hilar structures are normal. Cardiomegaly. Normal pulmonary vascularity . Persistent but improved bibasilar infiltrates. Small bilateral pleural effusions. No pneumothorax. IMPRESSION: 1. Persistent but improved bibasilar infiltrates. Small bilateral pleural effusions. 2. Cardiomegaly.  No pulmonary venous congestion . Electronically Signed   By: Marcello Moores  Register   On: 09/04/2017 06:43      Impression / Plan:   RAUNEL DIMARTINO is a 66 y.o. y/o male whom I have been consulted for anemia. He has a microcytic anemia and likely chronic iron deficiency . Chronic NSAID use  Plan  1. Iron studies, b12,folate, urine analysis 2. At this point of time  Due to the CHD ex and COPD exaceberation , risks of endoscopy out weight the benefits. Unless he has overt bleeding would avoid any endoscopy at this point. 3. If iron deficient give IV iron , discussed with Dr Margaretmary Eddy 4. Suggested to stop all NSAID's whichis likely cause of his anemia. Discharge on PPI,check stool H pylori antigen  4. F/u with Dr Gustavo Lah as an outpatient for Chronic hepatitis C , cirrhosis of theliver and anemia.   Thank you for involving me in the care of this patient.      LOS: 2 days   Marvin Bellows, MD  09/05/2017, 1:46 PM

## 2017-09-05 NOTE — Care Management Note (Signed)
Case Management Note  Patient Details  Name: Marvin Lewis MRN: [id number] Date of Birth: Mar 11, 1951  Subjective/Objective:        Admitted [facility] with the diagnosis of acute respiratory failure. Lives with wife, [name], [phone]). Prescriptions are filled at [facility] in [place] seen Dr. Sanda Klein as primary care physician  A year ago. Goes to 4 specialist. Home oxygen 3-4 liters per nasal cannula continuous per [facility] for 5 years. Nebulizer, rolling walker, and cane in the home.  Home Health per [place] in 2013. [place]  Following knee surgery in 2013.   Takes care of all basic activities of daily living himself, can drive, if needed. Last fall was 5-6 weeks ago., broke left arm. Good appetite. Wife will transport         Action/Plan: [facility] information given to wife. States she will read over this information and discuss with Dr. Raul Del.    Expected Discharge Date:  09/05/17               Expected Discharge Plan:     In-House Referral:   yes  Discharge planning Services     Post Acute Care Choice:   [facility] program  Choice offered to:   [name] with [name] present  DME Arranged:    DME Agency:     HH Arranged:    [place] Agency:     Status of Service:     If discussed at [employer], dates discussed:    Additional Comments:  Shelbie Ammons, RN MSN CCM Care Management [phone] 09/05/2017, 2:52 PM

## 2017-09-05 NOTE — Progress Notes (Signed)
[facility] Cardiology  SUBJECTIVE: Patient denies chest pain. States breathing feels back to baseline. Denies palpitations.   Vitals:   09/05/17 0315 09/05/17 0408 09/05/17 0500 09/05/17 0811  BP: (!) 148/77 136/84  (!) 148/84  Pulse: 78 79  81  Resp:  (!) 22  18  Temp: 98.8 F (37.1 C) 97.8 F (36.6 C)  98.4 F (36.9 C)  TempSrc: Oral Oral  Oral  SpO2: 95% 97%  96%  Weight:   108 kg (238 lb)   Height:         Intake/Output Summary (Last 24 hours) at 09/05/17 1138 Last data filed at 09/05/17 1000  Gross per 24 hour  Intake              810 ml  Output                0 ml  Net              810 ml      PHYSICAL EXAM  General: Chronically ill-appearing, WDWN, in no acute distress HEENT:  Normocephalic and atramatic Neck:  No JVD.  Lungs: Normal effort of breathing on supplemental oxygen. Heart: HRRR . Normal S1 and S2 without gallops or murmurs.  Abdomen: Bowel sounds are positive, abdomen soft and non-tender  Msk:  Gait not assessed. Sitting upright in bed. Strength and tone appropriate for age Extremities: 1-2+ bilateral lower extremity edema.   Neuro: Alert and oriented X 3. Psych:  Good affect, responds appropriately   LABS: Basic Metabolic Panel:  Recent Labs  09/04/17 0420 09/05/17 0514  NA 135 132*  K 5.0 4.7  CL 95* 92*  CO2 33* 29  GLUCOSE 243* 333*  BUN 30* 44*  CREATININE 1.00 1.14  CALCIUM 8.4* 8.8*  MG 1.6* 1.9   Liver Function Tests:  Recent Labs  09/03/17 0810  AST 48*  ALT 49  ALKPHOS 80  BILITOT 1.0  PROT 6.9  ALBUMIN 3.5   No results for input(s): LIPASE, AMYLASE in the last 72 hours. CBC:  Recent Labs  09/03/17 0810 09/04/17 0420  09/04/17 2118 09/05/17 0514  WBC 9.0 4.1  --   --  10.3  NEUTROABS 6.9*  --   --   --   --   HGB 8.5* 6.8*  < > 7.6* 8.7*  HCT 30.0* 24.3*  < > 26.8* 29.3*  MCV 71.1* 69.0*  --   --  70.1*  PLT 175 118*  --   --  163  < > = values in this interval not displayed. Cardiac Enzymes:  Recent  Labs  09/03/17 2336 09/04/17 1152 09/04/17 1603  TROPONINI 0.39* 0.19* 0.15*   BNP: Invalid input(s): POCBNP D-Dimer: No results for input(s): DDIMER in the last 72 hours. Hemoglobin A1C: No results for input(s): HGBA1C in the last 72 hours. Fasting Lipid Panel: No results for input(s): CHOL, HDL, LDLCALC, TRIG, CHOLHDL, LDLDIRECT in the last 72 hours. Thyroid Function Tests:  Recent Labs  09/04/17 0420  TSH 0.979   Anemia Panel: No results for input(s): VITAMINB12, FOLATE, FERRITIN, TIBC, IRON, RETICCTPCT in the last 72 hours.  Dg Chest Port 1 View  Result Date: 09/04/2017 CLINICAL DATA:  Respiratory failure. EXAM: PORTABLE CHEST 1 VIEW COMPARISON:  09/03/2017. FINDINGS: Mediastinum and hilar structures are normal. Cardiomegaly. Normal pulmonary vascularity . Persistent but improved bibasilar infiltrates. Small bilateral pleural effusions. No pneumothorax. IMPRESSION: 1. Persistent but improved bibasilar infiltrates. Small bilateral pleural effusions. 2. Cardiomegaly.  No pulmonary venous  congestion . Electronically Signed   By: Marcello Moores  Register   On: 09/04/2017 06:43     Echo Pending   ASSESSMENT AND PLAN:  Active Problems:   Acute respiratory failure (HCC)   Acute respiratory distress   Acute on chronic congestive heart failure ([place])   Palliative care by specialist    Assessment: 1. Acute hypoxic respiratory failure secondary to acute on chronic diastolic heart failure, COPD exacerbation, and pneumonia, received BiPAP, IV Lasix, IV steroids, and antibiotics. Reports breathing better and back to baseline on chronic supplemental oxygen. 2. Apparent 7-beat run of V-tach noted on telemetry; appears to be more consistent with atrial fibrillation with aberrancy. 3. Chronic atrial fibrillation, warfarin being held due to acute blood loss 4. Acute blood loss, hemoglobin and hematocrit now 7.6 and 26.8, respectively, status post blood transfusion 5. Borderline elevated  troponin of 0.06, 0.35, 0.40, 0.39, 0.19, followed by 0.15, in the absence of chest pain or ECG changes, likely demand supply ischemia in the setting of acute hypoxic respiratory failure 6. Essential hypertension 7. COPD on chronic supplemental oxygen 8. Hyperlipidemia 9. Type II diabetes 10. Ongoing tobacco abuse 11. Ongoing alcohol abuse  Recommendations: 1. Agree with overall therapy. 2. Review 2D echocardiogram 3. Continue to hold warfarin in the setting of acute blood loss, with plans to resume when stabilized 4. Continue IV Lasix with careful monitoring of renal status 5. Strongly encouraged patient to stop smoking and drinking alcohol. Discussed with him the risks associated with continuing.  Clabe Seal, PA-C 09/05/2017 11:38 AM

## 2017-09-05 NOTE — Discharge Summary (Addendum)
[place] [facility] [place] NAME: Marvin Lewis    MR#:  [id number]  DATE OF BIRTH:  1951-09-05  DATE OF ADMISSION:  09/03/2017 ADMITTING PHYSICIAN: Marvin Mango, MD  DATE OF DISCHARGE:  09/05/17  PRIMARY CARE PHYSICIAN: Marvin Hobby, MD    ADMISSION DIAGNOSIS:  Acute respiratory distress [R06.03] SOB (shortness of breath) [R06.02] Acute on chronic congestive heart failure, unspecified heart failure type ([place]) [I50.9]  DISCHARGE DIAGNOSIS:  Active Problems:   Acute respiratory failure (HCC)   Acute respiratory distress   Acute on chronic congestive heart failure ([place])   Palliative care by specialist Acute exacerbation of COPD Atrial fibrillation Anemia holding Coumadin  SECONDARY DIAGNOSIS:   Past Medical History:  Diagnosis Date  . A-fib ([place])   . Alcohol abuse   . Alcohol use   . Aortic aneurysm ([place]) 06/19/2017   Ascending 4.4 cm, CT scan July 2018  . Aortic aneurysm, intrathoracic ([place])   . Ascending aortic aneurysm ([place])   . Ascending aortic aneurysm ([place])   . Atrial fibrillation ([place]) 05/08/2016  . Broken rib April 2016   History of broken ribs x 3  . Bronchitis   . CAD (coronary artery disease)   . CHF (congestive heart failure) ([place])   . Chronic alcoholism ([place])   . Cirrhosis ([place])   . Closed nondisplaced fracture of fifth metatarsal bone of left foot Nov. 2016   left  . COPD (chronic obstructive pulmonary disease) ([place])   . Depression   . Diabetes mellitus without complication ([place])   . Emphysema of lung ([place])   . Esophageal rupture   . GERD (gastroesophageal reflux disease)   . Hepatitis C   . Hip arthritis   . History of diverticulitis    ruptured and sepsis  . History of hiatal hernia   . History of smoking   . HOH (hard of hearing)   . Hyperlipidemia   . Hypertension   . Hypoxemia   . Insomnia   . Movement disorder   . Onychogryphosis   . Pneumonia   . Rib fracture   . Seasonal  allergies   . Shortness of breath dyspnea   . Sleep apnea    uses O2 @2  L/min at night  . Stroke ([place])   . Thrombocytopenia [facility])     HOSPITAL COURSE:   HPI  Marvin Lewis  is a 66 y.o. male with a known history of chronic atrial fibrillation on Coumadin, CHF, COPD, coronary artery disease, alcohol abuse, on chronic 4 L of oxygenis presenting to the ED with a chief complaint of shortness of breath and cough. Chest x-ray has revealed pulmonary congestion and infiltrates. Patient was intubated in the past and had a tracheostomy. Currently patient is placed on BiPAP, Solu-Medrol was given. IV Lasix and antibiotics were also started and hospitalist team is called to admit the patient. Wife at bedside.  #acute hypoxic respiratory failure secondary to CHF exacerbation,COPD exacerbation and pneumonia Clinically improving, off BiPAP IV Lasix and IV steroids and antibiotics given during the hospital course. Change to by mouth  # Acute anemia Stool for occult blood is pending  Blood transfusion today and monitor hemoglobin and hematocrit Protonix given and GI consulted, recommended outpatient follow-up,Discussed with Marvin Lewis and Marvin Lewis Hemoglobin dropped from 8.5 to 6.8- 7.3-after blood transfusion is at 8.7, no active bleeding Hold Coumadin Patient was offered IV iron transfusion he refused, will start him on iron supplements by mouth, outpatient follow-up with  gastroenterology Marvin Lewis as soon as possible after discharge in 2-3 days   #Acute systolic CHF exacerbation IV Lasix, daily weight monitoring and intake and output during the hospital course. Change to by mouth Lasix 40 mg once daily Echocardiogram-35-40% ejection fraction, systolic function was moderately reduced. Mild mitral regurgitation.  BiPAP weaned off as tolerated [facility] cardiology following-outpatient follow-up after discharge add small dose beta blocker , once Patient is clinically stable , currently with COPD  exacerbation  continue sotalol, ACE inhibitor and statin   #Pneumonia IV Rocephin and azithromycin and sputum culture and sensitivity file can obtain sputum sample Discharge home with by mouth Augmentin clinically improved  #Acute COPD IV steroids and breathing treatments and given, clinically improved Discharge patient with a steroid tapering dose and Augmentin antibiotic  #Chronic history of atrial fibrillation currently rate controlled  hold Coumadin in view of acute anemia   GI consulted, recommended outpatient follow-up  #Recent left wrist fracture continue present pain management as needed    DISCHARGE CONDITIONS:   stable  CONSULTS OBTAINED:  Treatment Team:  Marvin Kida, MD Marvin Cowman, MD Marvin Bellows, MD   PROCEDURES  bipap  DRUG ALLERGIES:  No Known Allergies  DISCHARGE MEDICATIONS:   Current Discharge Medication List    START taking these medications   Details  acetaminophen (TYLENOL) 325 MG tablet Take 2 tablets (650 mg total) by mouth every 6 (six) hours as needed for mild pain or headache.    albuterol (PROVENTIL HFA;VENTOLIN HFA) 108 (90 Base) MCG/ACT inhaler Inhale 2 puffs into the lungs every 6 (six) hours as needed for wheezing or shortness of breath. Qty: 1 Inhaler, Refills: 1    amoxicillin-clavulanate (AUGMENTIN) 500-125 MG tablet Take 1 tablet (500 mg total) by mouth 3 (three) times daily. Qty: 15 tablet, Refills: 0    folic acid (FOLVITE) 1 MG tablet Take 1 tablet (1 mg total) by mouth daily.    guaiFENesin-dextromethorphan (ROBITUSSIN DM) 100-10 MG/5ML syrup Take 10 mLs by mouth every 6 (six) hours as needed for cough. Qty: 118 mL, Refills: 0    Multiple Vitamin (MULTIVITAMIN WITH MINERALS) TABS tablet Take 1 tablet by mouth daily.    nicotine (NICODERM CQ - DOSED IN MG/24 HOURS) 21 mg/24hr patch Place 1 patch (21 mg total) onto the skin daily. Qty: 28 patch, Refills: 0    oxyCODONE (OXY IR/ROXICODONE) 5 MG  immediate release tablet Take 1 tablet (5 mg total) by mouth every 6 (six) hours as needed for moderate pain or breakthrough pain. Qty: 15 tablet, Refills: 0    pantoprazole (PROTONIX) 40 MG tablet Take 1 tablet (40 mg total) by mouth daily. Qty: 30 tablet, Refills: 1    predniSONE (STERAPRED UNI-PAK 21 TAB) 10 MG (21) TBPK tablet Take 1 tablet (10 mg total) by mouth daily. Take 6 tablets by mouth for 1 day followed by  5 tablets by mouth for 1 day followed by  4 tablets by mouth for 1 day followed by  3 tablets by mouth for 1 day followed by  2 tablets by mouth for 1 day followed by  1 tablet by mouth for a day and stop Qty: 21 tablet, Refills: 0      CONTINUE these medications which have CHANGED   Details  furosemide (LASIX) 20 MG tablet Take 2 tablets (40 mg total) by mouth daily. Qty: 30 tablet, Refills: 0      CONTINUE these medications which have NOT CHANGED   Details  amLODipine (NORVASC) 5 MG  tablet Take 5 mg by mouth daily.    atorvastatin (LIPITOR) 10 MG tablet Take 1 tablet (10 mg total) by mouth at bedtime. Qty: 7 tablet, Refills: 0    B-D INS SYRINGE 0.5CC/30GX1/2" 30G X 1/2" 0.5 ML MISC AS DIRECTED. Qty: 100 each, Refills: 12    benazepril (LOTENSIN) 10 MG tablet Take 10 mg by mouth daily.     fluticasone-salmeterol (ADVAIR HFA) 115-21 MCG/ACT inhaler Inhale 2 puffs into the lungs every 12 (twelve) hours.    insulin aspart (NOVOLOG) 100 UNIT/ML FlexPen Sliding scale Sub-Q for FSBS 200-249 give 3 units, 250-299 give 5 units, 300-349 give 7 units, 350-399 give 9 units, 400+ give 12 units Qty: 15 mL, Refills: 1    ipratropium-albuterol (DUONEB) 0.5-2.5 (3) MG/3ML SOLN Take 3 mLs by nebulization 4 (four) times daily as needed. For shortness of breath/wheezing    sotalol (BETAPACE) 80 MG tablet Take 1 tablet (80 mg total) by mouth 2 (two) times daily. Qty: 60 tablet, Refills: 11    TRESIBA FLEXTOUCH 200 UNIT/ML SOPN Inject 48 Units into the skin at bedtime.      zolpidem (AMBIEN) 10 MG tablet Take 10 mg by mouth at bedtime as needed for sleep.       STOP taking these medications     omeprazole (PRILOSEC) 20 MG capsule      warfarin (COUMADIN) 2 MG tablet          DISCHARGE INSTRUCTIONS:  Continue 3 L of home oxygen via nasal cannula Follow-up with primary care physician in one week, please consider repeating CBC Follow-up with pulmonology in 2 weeks Follow-up with cardiologykc in 2 weeks Follow-up with gastroenterology in 1 week Stop Coumadin  DIET:  Cardiac diet  DISCHARGE CONDITION:  Stable  ACTIVITY:  Activity as tolerated  OXYGEN:  Home Oxygen: Yes.     Oxygen Delivery: 3 liters/min via Patient connected to nasal cannula oxygen  DISCHARGE LOCATION:  home   If you experience worsening of your admission symptoms, develop shortness of breath, life threatening emergency, suicidal or homicidal thoughts you must seek medical attention immediately by calling 911 or calling your MD immediately  if symptoms less severe.  You Must read complete instructions/literature along with all the possible adverse reactions/side effects for all the Medicines you take and that have been prescribed to you. Take any new Medicines after you have completely understood and accpet all the possible adverse reactions/side effects.   Please note  You were cared for by a hospitalist during your hospital stay. If you have any questions about your discharge medications or the care you received while you were in the hospital after you are discharged, you can call the unit and asked to speak with the hospitalist on call if the hospitalist that took care of you is not available. Once you are discharged, your primary care physician will handle any further medical issues. Please note that NO REFILLS for any discharge medications will be authorized once you are discharged, as it is imperative that you return to your primary care physician (or establish a  relationship with a primary care physician if you do not have one) for your aftercare needs so that they can reassess your need for medications and monitor your lab values.     Today  Chief Complaint  Patient presents with  . Shortness of Breath   Patient is feeling much better shortness of breath significantly improved denies any palpitations and wants to go home. Wife at bedside  ROS:  CONSTITUTIONAL: Denies fevers, chills. Denies any fatigue, weakness.  EYES: Denies blurry vision, double vision, eye pain. EARS, NOSE, THROAT: Denies tinnitus, ear pain, hearing loss. RESPIRATORY: Denies cough, wheeze, shortness of breath.  CARDIOVASCULAR: Denies chest pain, palpitations, edema.  GASTROINTESTINAL: Denies nausea, vomiting, diarrhea, abdominal pain. Denies bright red blood per rectum. GENITOURINARY: Denies dysuria, hematuria. ENDOCRINE: Denies nocturia or thyroid problems. HEMATOLOGIC AND LYMPHATIC: Denies easy bruising or bleeding. SKIN: Denies rash or lesion. MUSCULOSKELETAL: Denies pain in neck, back, shoulder, knees, hips or arthritic symptoms.  NEUROLOGIC: Denies paralysis, paresthesias.  PSYCHIATRIC: Denies anxiety or depressive symptoms.   VITAL SIGNS:  Blood pressure 137/74, pulse 69, temperature 97.8 F (36.6 C), temperature source Oral, resp. rate 18, height 5\' 11"  (1.803 m), weight 108 kg (238 lb), SpO2 98 %.  I/O:    Intake/Output Summary (Last 24 hours) at 09/05/17 1429 Last data filed at 09/05/17 1339  Gross per 24 hour  Intake             1170 ml  Output                0 ml  Net             1170 ml    PHYSICAL EXAMINATION:  GENERAL:  66 y.o.-year-old patient lying in the bed with no acute distress.  EYES: Pupils equal, round, reactive to light and accommodation. No scleral icterus. Extraocular muscles intact.  HEENT: Head atraumatic, normocephalic. Oropharynx and nasopharynx clear.  NECK:  Supple, no jugular venous distention. No thyroid enlargement, no  tenderness.  LUNGS: Normal breath sounds bilaterally, no wheezing, rales,rhonchi or crepitation. No use of accessory muscles of respiration.  CARDIOVASCULAR: S1, S2 normal. No murmurs, rubs, or gallops.  ABDOMEN: Soft, non-tender, non-distended. Bowel sounds present. No organomegaly or mass.  EXTREMITIES: No pedal edema, cyanosis, or clubbing.  NEUROLOGIC: Cranial nerves II through XII are intact. Muscle strength 5/5 in all extremities. Sensation intact. Gait not checked.  PSYCHIATRIC: The patient is alert and oriented x 3.  SKIN: No obvious rash, lesion, or ulcer.   DATA REVIEW:   CBC  Recent Labs Lab 09/05/17 0514  WBC 10.3  HGB 8.7*  HCT 29.3*  PLT 163    Chemistries   Recent Labs Lab 09/03/17 0810  09/05/17 0514  NA 136  < > 132*  K 4.9  < > 4.7  CL 97*  < > 92*  CO2 29  < > 29  GLUCOSE 257*  < > 333*  BUN 21*  < > 44*  CREATININE 0.90  < > 1.14  CALCIUM 8.6*  < > 8.8*  MG  --   < > 1.9  AST 48*  --   --   ALT 49  --   --   ALKPHOS 80  --   --   BILITOT 1.0  --   --   < > = values in this interval not displayed.  Cardiac Enzymes  Recent Labs Lab 09/04/17 1603  TROPONINI 0.15*    Microbiology Results  Results for orders placed or performed during the hospital encounter of 09/03/17  Culture, blood (routine x 2)     Status: None (Preliminary result)   Collection Time: 09/03/17  8:11 AM  Result Value Ref Range Status   Specimen Description BLOOD LEFT AC  Final   Special Requests   Final    BOTTLES DRAWN AEROBIC AND ANAEROBIC Blood Culture results may not be optimal due to an excessive volume of blood  received in culture bottles   Culture NO GROWTH 2 DAYS  Final   Report Status PENDING  Incomplete  Culture, blood (routine x 2)     Status: None (Preliminary result)   Collection Time: 09/03/17  8:16 AM  Result Value Ref Range Status   Specimen Description BLOOD RIGHT HAND  Final   Special Requests   Final    BOTTLES DRAWN AEROBIC AND ANAEROBIC Blood  Culture adequate volume   Culture NO GROWTH 2 DAYS  Final   Report Status PENDING  Incomplete  MRSA PCR Screening     Status: None   Collection Time: 09/03/17 11:24 AM  Result Value Ref Range Status   MRSA by PCR NEGATIVE NEGATIVE Final    Comment:        The GeneXpert MRSA Assay (FDA approved for NASAL specimens only), is one component of a comprehensive MRSA colonization surveillance program. It is not intended to diagnose MRSA infection nor to guide or monitor treatment for MRSA infections.     RADIOLOGY:  Dg Chest Port 1 View  Result Date: 09/04/2017 CLINICAL DATA:  Respiratory failure. EXAM: PORTABLE CHEST 1 VIEW COMPARISON:  09/03/2017. FINDINGS: Mediastinum and hilar structures are normal. Cardiomegaly. Normal pulmonary vascularity . Persistent but improved bibasilar infiltrates. Small bilateral pleural effusions. No pneumothorax. IMPRESSION: 1. Persistent but improved bibasilar infiltrates. Small bilateral pleural effusions. 2. Cardiomegaly.  No pulmonary venous congestion . Electronically Signed   By: Marcello Moores  Register   On: 09/04/2017 06:43   Dg Chest Port 1 View  Result Date: 09/03/2017 CLINICAL DATA:  Shortness of breath and decreased oxygen saturation EXAM: PORTABLE CHEST 1 VIEW COMPARISON:  Chest radiograph June 17, 2017 and chest CT June 17, 2017 FINDINGS: There is airspace consolidation in both lower lobes, more on the right than on the left. There is a small right pleural effusion. Lungs elsewhere clear. Heart is mildly enlarged with mild pulmonary venous hypertension. No adenopathy. No bone lesions. IMPRESSION: Underlying pulmonary vascular congestion. Airspace consolidation in both lower lobes, more severe on the right than on the left, likely pneumonia. Rather minimal right pleural effusion evident. Followup PA and lateral chest radiographs recommended in 3-4 weeks following trial of antibiotic therapy to ensure resolution and exclude underlying malignancy.  Electronically Signed   By: Lowella Grip III M.D.   On: 09/03/2017 08:29    EKG:   Orders placed or performed during the hospital encounter of 06/17/17  . ED EKG within 10 minutes  . ED EKG within 10 minutes  . EKG 12-Lead  . EKG 12-Lead      Management plans discussed with the patient, family and they are in agreement.  CODE STATUS:     Code Status Orders        Start     Ordered   09/03/17 1140  Full code  Continuous     09/03/17 1139    Code Status History    Date Active Date Inactive Code Status Order ID Comments User Context   06/17/2017  5:17 AM 06/18/2017  4:08 PM Full Code [id number]  Saundra Shelling, MD ED   05/21/2016  9:08 AM 05/22/2016  2:30 PM Full Code [id number]  Teodoro Spray, MD Inpatient   05/26/2015  2:13 PM 05/30/2015  5:25 PM Full Code [id number]  Loletha Grayer, MD ED    Advance Directive Documentation     Most Recent Value  Type of Advance Directive  Healthcare Power of Attorney, Living will  Pre-existing out of facility DNR  order (yellow form or pink MOST form)  -  "MOST" Form in Place?  -      TOTAL TIME TAKING CARE OF THIS PATIENT: 45  minutes.   Note: This dictation was prepared with Dragon dictation along with smaller phrase technology. Any transcriptional errors that result from this process are unintentional.   @[facility] @  on 09/05/2017 at 2:29 PM  Between 7am to 6pm - Pager - [phone]  After 6pm go to [email or web address] - password [email or web address] [facility]  [facility]  Office  [phone]  CC: Primary care physician; Marvin Hobby, MD

## 2017-09-05 NOTE — Discharge Instructions (Signed)
Continue 3 L of home oxygen via nasal cannula Follow-up with primary care physician in one week, please consider repeating CBC Follow-up with pulmonology in 2 weeks Follow-up with cardiologykc in 2 weeks Follow-up with gastroenterology in 1 week Stop Coumadin

## 2017-09-05 NOTE — Progress Notes (Signed)
Inpatient Diabetes Program Recommendations  AACE/ADA: New Consensus Statement on Inpatient Glycemic Control (2015)  Target Ranges:  Prepandial:   less than 140 mg/dL      Peak postprandial:   less than 180 mg/dL (1-2 hours)      Critically ill patients:  140 - 180 mg/dL   Results for Marvin Lewis, Marvin Lewis (MRN [id number]) as of 09/05/2017 09:03  Ref. Range 09/04/2017 07:17 09/04/2017 12:00 09/04/2017 16:57 09/04/2017 20:47  Glucose-Capillary Latest Ref Range: 65 - 99 mg/dL 216 (H)  7 units Novolog 268 (H)  11 units Novolog 243 (H)  7 units Novolog 273 (H)  3 units Novolog   Results for Marvin Lewis, Marvin Lewis (MRN [id number]) as of 09/05/2017 09:03  Ref. Range 09/05/2017 07:30  Glucose-Capillary Latest Ref Range: 65 - 99 mg/dL 356 (H)  20 units Novolog    Home DM Meds: Tresiba 48 units QHS                             Novolog 12 units with Breakfast/ 12 units with Lunch/ 20 units with Dinner  Current Insulin Orders: Novolog Resistant Correction Scale/ SSI (0-20 units) TID AC + HS       MD- Note patient getting Solumedrol 40 mg BID at present.  CBGs quite high.  Please consider the following:  Start Lantus 38 units daily (80% total home dose to start).  Please start this AM.    --Will follow patient during hospitalization--  [name] RN, MSN, CDE Diabetes Coordinator Inpatient Glycemic Control Team Team Pager: [phone] (8a-5p)

## 2017-09-05 NOTE — Progress Notes (Signed)
PT being discharged home, discharge instructions and prescriptions reviewed with pt and wife, states understanding, pt with no complaints at discharge

## 2017-09-08 LAB — CULTURE, BLOOD (ROUTINE X 2)
CULTURE: NO GROWTH
Culture: NO GROWTH
Special Requests: ADEQUATE

## 2017-09-10 DIAGNOSIS — S52532A Colles' fracture of left radius, initial encounter for closed fracture: Secondary | ICD-10-CM | POA: Diagnosis not present

## 2017-09-16 ENCOUNTER — Inpatient Hospital Stay
Admission: EM | Admit: 2017-09-16 | Discharge: 2017-09-19 | DRG: 481 | Disposition: A | Discharge status: Skilled Nursing Facility | Payer: Medicare Other | Attending: Specialist | Admitting: Specialist

## 2017-09-16 ENCOUNTER — Emergency Department: Payer: Medicare Other

## 2017-09-16 ENCOUNTER — Inpatient Hospital Stay: Payer: Medicare Other

## 2017-09-16 ENCOUNTER — Encounter
Admission: EM | Disposition: A | Discharge status: Skilled Nursing Facility | Payer: Self-pay | Source: Home / Self Care | Attending: Specialist

## 2017-09-16 ENCOUNTER — Inpatient Hospital Stay: Payer: Medicare Other | Admitting: Anesthesiology

## 2017-09-16 ENCOUNTER — Encounter: Payer: Self-pay | Admitting: *Deleted

## 2017-09-16 DIAGNOSIS — S79911A Unspecified injury of right hip, initial encounter: Secondary | ICD-10-CM | POA: Diagnosis not present

## 2017-09-16 DIAGNOSIS — Z8673 Personal history of transient ischemic attack (TIA), and cerebral infarction without residual deficits: Secondary | ICD-10-CM

## 2017-09-16 DIAGNOSIS — Z8619 Personal history of other infectious and parasitic diseases: Secondary | ICD-10-CM

## 2017-09-16 DIAGNOSIS — H9190 Unspecified hearing loss, unspecified ear: Secondary | ICD-10-CM | POA: Diagnosis present

## 2017-09-16 DIAGNOSIS — Z79899 Other long term (current) drug therapy: Secondary | ICD-10-CM | POA: Diagnosis not present

## 2017-09-16 DIAGNOSIS — F1020 Alcohol dependence, uncomplicated: Secondary | ICD-10-CM | POA: Diagnosis present

## 2017-09-16 DIAGNOSIS — S72141A Displaced intertrochanteric fracture of right femur, initial encounter for closed fracture: Principal | ICD-10-CM | POA: Diagnosis present

## 2017-09-16 DIAGNOSIS — Z9981 Dependence on supplemental oxygen: Secondary | ICD-10-CM | POA: Diagnosis not present

## 2017-09-16 DIAGNOSIS — Y9301 Activity, walking, marching and hiking: Secondary | ICD-10-CM | POA: Diagnosis present

## 2017-09-16 DIAGNOSIS — S72001A Fracture of unspecified part of neck of right femur, initial encounter for closed fracture: Secondary | ICD-10-CM | POA: Diagnosis present

## 2017-09-16 DIAGNOSIS — Z7401 Bed confinement status: Secondary | ICD-10-CM | POA: Diagnosis not present

## 2017-09-16 DIAGNOSIS — G4700 Insomnia, unspecified: Secondary | ICD-10-CM | POA: Diagnosis present

## 2017-09-16 DIAGNOSIS — I714 Abdominal aortic aneurysm, without rupture: Secondary | ICD-10-CM | POA: Diagnosis present

## 2017-09-16 DIAGNOSIS — F17210 Nicotine dependence, cigarettes, uncomplicated: Secondary | ICD-10-CM | POA: Diagnosis present

## 2017-09-16 DIAGNOSIS — G4730 Sleep apnea, unspecified: Secondary | ICD-10-CM | POA: Diagnosis present

## 2017-09-16 DIAGNOSIS — I110 Hypertensive heart disease with heart failure: Secondary | ICD-10-CM | POA: Diagnosis present

## 2017-09-16 DIAGNOSIS — E785 Hyperlipidemia, unspecified: Secondary | ICD-10-CM | POA: Diagnosis present

## 2017-09-16 DIAGNOSIS — Z419 Encounter for procedure for purposes other than remedying health state, unspecified: Secondary | ICD-10-CM

## 2017-09-16 DIAGNOSIS — D638 Anemia in other chronic diseases classified elsewhere: Secondary | ICD-10-CM | POA: Diagnosis present

## 2017-09-16 DIAGNOSIS — J439 Emphysema, unspecified: Secondary | ICD-10-CM | POA: Diagnosis present

## 2017-09-16 DIAGNOSIS — E11649 Type 2 diabetes mellitus with hypoglycemia without coma: Secondary | ICD-10-CM

## 2017-09-16 DIAGNOSIS — I482 Chronic atrial fibrillation: Secondary | ICD-10-CM | POA: Diagnosis present

## 2017-09-16 DIAGNOSIS — M84459A Pathological fracture, hip, unspecified, initial encounter for fracture: Secondary | ICD-10-CM | POA: Diagnosis not present

## 2017-09-16 DIAGNOSIS — K7460 Unspecified cirrhosis of liver: Secondary | ICD-10-CM | POA: Diagnosis present

## 2017-09-16 DIAGNOSIS — Z862 Personal history of diseases of the blood and blood-forming organs and certain disorders involving the immune mechanism: Secondary | ICD-10-CM

## 2017-09-16 DIAGNOSIS — S0990XA Unspecified injury of head, initial encounter: Secondary | ICD-10-CM | POA: Diagnosis not present

## 2017-09-16 DIAGNOSIS — M1610 Unilateral primary osteoarthritis, unspecified hip: Secondary | ICD-10-CM | POA: Diagnosis present

## 2017-09-16 DIAGNOSIS — I10 Essential (primary) hypertension: Secondary | ICD-10-CM | POA: Diagnosis not present

## 2017-09-16 DIAGNOSIS — M1990 Unspecified osteoarthritis, unspecified site: Secondary | ICD-10-CM | POA: Diagnosis not present

## 2017-09-16 DIAGNOSIS — J449 Chronic obstructive pulmonary disease, unspecified: Secondary | ICD-10-CM | POA: Diagnosis not present

## 2017-09-16 DIAGNOSIS — G259 Extrapyramidal and movement disorder, unspecified: Secondary | ICD-10-CM | POA: Diagnosis present

## 2017-09-16 DIAGNOSIS — I5022 Chronic systolic (congestive) heart failure: Secondary | ICD-10-CM | POA: Diagnosis present

## 2017-09-16 DIAGNOSIS — I4891 Unspecified atrial fibrillation: Secondary | ICD-10-CM | POA: Diagnosis not present

## 2017-09-16 DIAGNOSIS — E119 Type 2 diabetes mellitus without complications: Secondary | ICD-10-CM | POA: Diagnosis not present

## 2017-09-16 DIAGNOSIS — S72009A Fracture of unspecified part of neck of unspecified femur, initial encounter for closed fracture: Secondary | ICD-10-CM

## 2017-09-16 DIAGNOSIS — I2510 Atherosclerotic heart disease of native coronary artery without angina pectoris: Secondary | ICD-10-CM | POA: Diagnosis present

## 2017-09-16 DIAGNOSIS — K219 Gastro-esophageal reflux disease without esophagitis: Secondary | ICD-10-CM | POA: Diagnosis present

## 2017-09-16 DIAGNOSIS — Z794 Long term (current) use of insulin: Secondary | ICD-10-CM | POA: Diagnosis not present

## 2017-09-16 DIAGNOSIS — W1830XA Fall on same level, unspecified, initial encounter: Secondary | ICD-10-CM | POA: Diagnosis present

## 2017-09-16 DIAGNOSIS — F329 Major depressive disorder, single episode, unspecified: Secondary | ICD-10-CM | POA: Diagnosis not present

## 2017-09-16 DIAGNOSIS — M25551 Pain in right hip: Secondary | ICD-10-CM | POA: Diagnosis not present

## 2017-09-16 DIAGNOSIS — W228XXA Striking against or struck by other objects, initial encounter: Secondary | ICD-10-CM | POA: Diagnosis present

## 2017-09-16 DIAGNOSIS — Z7951 Long term (current) use of inhaled steroids: Secondary | ICD-10-CM

## 2017-09-16 DIAGNOSIS — M79604 Pain in right leg: Secondary | ICD-10-CM | POA: Diagnosis not present

## 2017-09-16 HISTORY — PX: INTRAMEDULLARY (IM) NAIL INTERTROCHANTERIC: SHX5875

## 2017-09-16 LAB — GLUCOSE, CAPILLARY
GLUCOSE-CAPILLARY: 133 mg/dL — AB (ref 65–99)
Glucose-Capillary: 169 mg/dL — ABNORMAL HIGH (ref 65–99)
Glucose-Capillary: 345 mg/dL — ABNORMAL HIGH (ref 65–99)

## 2017-09-16 LAB — CBC WITH DIFFERENTIAL/PLATELET
BASOS PCT: 1 %
Basophils Absolute: 0.1 10*3/uL (ref 0–0.1)
Eosinophils Absolute: 0.1 10*3/uL (ref 0–0.7)
Eosinophils Relative: 1 %
HEMATOCRIT: 33.7 % — AB (ref 40.0–52.0)
HEMOGLOBIN: 9.8 g/dL — AB (ref 13.0–18.0)
LYMPHS ABS: 0.7 10*3/uL — AB (ref 1.0–3.6)
LYMPHS PCT: 5 %
MCH: 21.4 pg — AB (ref 26.0–34.0)
MCHC: 29.1 g/dL — AB (ref 32.0–36.0)
MCV: 73.5 fL — AB (ref 80.0–100.0)
MONO ABS: 0.6 10*3/uL (ref 0.2–1.0)
MONOS PCT: 5 %
NEUTROS ABS: 11 10*3/uL — AB (ref 1.4–6.5)
NEUTROS PCT: 88 %
Platelets: 105 10*3/uL — ABNORMAL LOW (ref 150–440)
RBC: 4.58 MIL/uL (ref 4.40–5.90)
RDW: 22 % — AB (ref 11.5–14.5)
WBC: 12.4 10*3/uL — ABNORMAL HIGH (ref 3.8–10.6)

## 2017-09-16 LAB — COMPREHENSIVE METABOLIC PANEL
ALBUMIN: 3.5 g/dL (ref 3.5–5.0)
ALK PHOS: 87 U/L (ref 38–126)
ALT: 106 U/L — ABNORMAL HIGH (ref 17–63)
ANION GAP: 11 (ref 5–15)
AST: 97 U/L — ABNORMAL HIGH (ref 15–41)
BILIRUBIN TOTAL: 0.9 mg/dL (ref 0.3–1.2)
BUN: 25 mg/dL — ABNORMAL HIGH (ref 6–20)
CALCIUM: 8.7 mg/dL — AB (ref 8.9–10.3)
CO2: 27 mmol/L (ref 22–32)
Chloride: 102 mmol/L (ref 101–111)
Creatinine, Ser: 0.96 mg/dL (ref 0.61–1.24)
GFR calc Af Amer: >60 mL/min (ref >60)
GLUCOSE: 200 mg/dL — AB (ref 65–99)
Potassium: 3.9 mmol/L (ref 3.5–5.1)
Sodium: 140 mmol/L (ref 135–145)
TOTAL PROTEIN: 7 g/dL (ref 6.5–8.1)

## 2017-09-16 LAB — URINALYSIS, COMPLETE (UACMP) WITH MICROSCOPIC
BACTERIA UA: NONE SEEN
Bilirubin Urine: NEGATIVE
GLUCOSE, UA: NEGATIVE mg/dL
Hgb urine dipstick: NEGATIVE
KETONES UR: NEGATIVE mg/dL
LEUKOCYTES UA: NEGATIVE
Nitrite: NEGATIVE
PH: 5 (ref 5.0–8.0)
Protein, ur: 100 mg/dL — AB
SPECIFIC GRAVITY, URINE: 1.02 (ref 1.005–1.030)
SQUAMOUS EPITHELIAL / LPF: NONE SEEN

## 2017-09-16 LAB — CK: Total CK: 59 U/L (ref 49–397)

## 2017-09-16 LAB — TYPE AND SCREEN
ABO/RH(D): O POS
Antibody Screen: NEGATIVE

## 2017-09-16 LAB — PROTIME-INR
INR: 0.97
PROTHROMBIN TIME: 12.8 s (ref 11.4–15.2)

## 2017-09-16 LAB — ETHANOL: ALCOHOL ETHYL (B): 98 mg/dL — AB (ref <10)

## 2017-09-16 SURGERY — FIXATION, FRACTURE, INTERTROCHANTERIC, WITH INTRAMEDULLARY ROD
Anesthesia: Spinal | Site: Hip | Laterality: Right | Wound class: Clean

## 2017-09-16 MED ORDER — CEFAZOLIN SODIUM-DEXTROSE 2-4 GM/100ML-% IV SOLN: 2.0000 g | INTRAVENOUS | Status: DC

## 2017-09-16 MED ORDER — FERROUS SULFATE 325 (65 FE) MG PO TABS
325.0000 mg | ORAL_TABLET | Freq: Two times a day (BID) | ORAL | Status: DC
  Administered 2017-09-17 – 2017-09-19 (×5): 325 mg via ORAL
  Filled 2017-09-16 (×5): qty 1

## 2017-09-16 MED ORDER — ACETAMINOPHEN 10 MG/ML IV SOLN
INTRAVENOUS | Status: AC
Start: 2017-09-16 — End: 2017-09-16
  Filled 2017-09-16: qty 100

## 2017-09-16 MED ORDER — BENAZEPRIL HCL 10 MG PO TABS
10.0000 mg | ORAL_TABLET | Freq: Every day | ORAL | Status: DC
  Administered 2017-09-17 – 2017-09-19 (×3): 10 mg via ORAL
  Filled 2017-09-16 (×4): qty 1

## 2017-09-16 MED ORDER — NEOMYCIN-POLYMYXIN B GU 40-200000 IR SOLN
Status: AC
Start: 2017-09-16 — End: ?
  Filled 2017-09-16: qty 4

## 2017-09-16 MED ORDER — INSULIN ASPART 100 UNIT/ML ~~LOC~~ SOLN: 0.0000 [IU] | Freq: Every day | SUBCUTANEOUS | Status: DC

## 2017-09-16 MED ORDER — AMLODIPINE BESYLATE 5 MG PO TABS
5.0000 mg | ORAL_TABLET | Freq: Every day | ORAL | Status: DC
  Administered 2017-09-17 – 2017-09-19 (×3): 5 mg via ORAL
  Filled 2017-09-16 (×3): qty 1

## 2017-09-16 MED ORDER — PHENYLEPHRINE HCL 10 MG/ML IJ SOLN
INTRAMUSCULAR | Status: DC | PRN
  Administered 2017-09-16 (×2): 100 ug via INTRAVENOUS

## 2017-09-16 MED ORDER — METHYLPREDNISOLONE SODIUM SUCC 125 MG IJ SOLR
INTRAMUSCULAR | Status: DC | PRN
  Administered 2017-09-16: 125 mg via INTRAVENOUS

## 2017-09-16 MED ORDER — CEFAZOLIN SODIUM 1 G IJ SOLR
INTRAMUSCULAR | Status: AC
  Filled 2017-09-16: qty 20

## 2017-09-16 MED ORDER — ENOXAPARIN SODIUM 40 MG/0.4ML ~~LOC~~ SOLN
40.0000 mg | SUBCUTANEOUS | Status: DC
  Administered 2017-09-17 – 2017-09-19 (×3): 40 mg via SUBCUTANEOUS
  Filled 2017-09-16 (×3): qty 0.4

## 2017-09-16 MED ORDER — EPINEPHRINE PF 1 MG/ML IJ SOLN
INTRAMUSCULAR | Status: DC | PRN
  Administered 2017-09-16: .0001 mg via INTRAVENOUS

## 2017-09-16 MED ORDER — PROPOFOL 500 MG/50ML IV EMUL
INTRAVENOUS | Status: AC
  Filled 2017-09-16: qty 50

## 2017-09-16 MED ORDER — DEXTROSE 5 % IV SOLN
Freq: Four times a day (QID) | INTRAVENOUS | Status: AC
  Administered 2017-09-17: via INTRAVENOUS
  Filled 2017-09-16 (×2): qty 20

## 2017-09-16 MED ORDER — MORPHINE SULFATE (PF) 4 MG/ML IV SOLN
4.0000 mg | Freq: Once | INTRAVENOUS | Status: AC
  Administered 2017-09-16: 4 mg via INTRAVENOUS
  Filled 2017-09-16: qty 1

## 2017-09-16 MED ORDER — ONDANSETRON HCL 4 MG PO TABS: 4.0000 mg | ORAL_TABLET | Freq: Four times a day (QID) | ORAL | Status: DC | PRN

## 2017-09-16 MED ORDER — FOLIC ACID 1 MG PO TABS: 1.0000 mg | ORAL_TABLET | Freq: Every day | ORAL | Status: DC

## 2017-09-16 MED ORDER — SENNA 8.6 MG PO TABS
1.0000 | ORAL_TABLET | Freq: Two times a day (BID) | ORAL | Status: DC
  Administered 2017-09-16 – 2017-09-19 (×6): 8.6 mg via ORAL
  Filled 2017-09-16 (×6): qty 1

## 2017-09-16 MED ORDER — GLYCOPYRROLATE 0.2 MG/ML IJ SOLN
INTRAMUSCULAR | Status: AC
  Filled 2017-09-16: qty 1

## 2017-09-16 MED ORDER — MIDAZOLAM HCL 2 MG/2ML IJ SOLN
INTRAMUSCULAR | Status: AC
  Filled 2017-09-16: qty 2

## 2017-09-16 MED ORDER — KETAMINE HCL 50 MG/ML IJ SOLN
INTRAMUSCULAR | Status: AC
  Filled 2017-09-16: qty 10

## 2017-09-16 MED ORDER — EPHEDRINE SULFATE 50 MG/ML IJ SOLN
INTRAMUSCULAR | Status: AC
  Filled 2017-09-16: qty 1

## 2017-09-16 MED ORDER — OXYCODONE HCL 5 MG PO TABS
5.0000 mg | ORAL_TABLET | ORAL | Status: DC | PRN
  Administered 2017-09-16 – 2017-09-17 (×5): 10 mg via ORAL
  Filled 2017-09-16 (×5): qty 2

## 2017-09-16 MED ORDER — PHENYLEPHRINE HCL 10 MG/ML IJ SOLN
INTRAMUSCULAR | Status: DC | PRN
  Administered 2017-09-16: 25 ug/min via INTRAVENOUS

## 2017-09-16 MED ORDER — ALUM & MAG HYDROXIDE-SIMETH 200-200-20 MG/5ML PO SUSP: 30.0000 mL | ORAL | Status: DC | PRN

## 2017-09-16 MED ORDER — ACETAMINOPHEN 650 MG RE SUPP: 650.0000 mg | Freq: Four times a day (QID) | RECTAL | Status: DC | PRN

## 2017-09-16 MED ORDER — CEFAZOLIN SODIUM-DEXTROSE 2-4 GM/100ML-% IV SOLN
2.0000 g | Freq: Four times a day (QID) | INTRAVENOUS | Status: DC
  Filled 2017-09-16 (×3): qty 100

## 2017-09-16 MED ORDER — SODIUM CHLORIDE 0.9 % IV SOLN
INTRAVENOUS | Status: DC | PRN
  Administered 2017-09-16: 13:00:00 via INTRAVENOUS

## 2017-09-16 MED ORDER — HYDROMORPHONE HCL 1 MG/ML IJ SOLN
1.0000 mg | INTRAMUSCULAR | Status: DC | PRN
  Administered 2017-09-16 – 2017-09-17 (×3): 1 mg via INTRAVENOUS
  Filled 2017-09-16 (×3): qty 1

## 2017-09-16 MED ORDER — ONDANSETRON HCL 4 MG/2ML IJ SOLN
INTRAMUSCULAR | Status: DC | PRN
  Administered 2017-09-16: 4 mg via INTRAVENOUS

## 2017-09-16 MED ORDER — SODIUM CHLORIDE 0.9 % IV SOLN
75.0000 mL/h | INTRAVENOUS | Status: DC
  Administered 2017-09-16: 75 mL/h via INTRAVENOUS

## 2017-09-16 MED ORDER — POLYETHYLENE GLYCOL 3350 17 G PO PACK
17.0000 g | PACK | Freq: Every day | ORAL | Status: DC | PRN
  Administered 2017-09-17 – 2017-09-18 (×2): 17 g via ORAL
  Filled 2017-09-16 (×2): qty 1

## 2017-09-16 MED ORDER — HYDROMORPHONE HCL 1 MG/ML IJ SOLN
1.0000 mg | INTRAMUSCULAR | Status: DC | PRN
  Administered 2017-09-16: 1 mg via INTRAVENOUS
  Filled 2017-09-16: qty 1

## 2017-09-16 MED ORDER — ATORVASTATIN CALCIUM 10 MG PO TABS
10.0000 mg | ORAL_TABLET | Freq: Every day | ORAL | Status: DC
  Administered 2017-09-16 – 2017-09-18 (×3): 10 mg via ORAL
  Filled 2017-09-16 (×3): qty 1

## 2017-09-16 MED ORDER — SEVOFLURANE IN SOLN
RESPIRATORY_TRACT | Status: AC
  Filled 2017-09-16: qty 250

## 2017-09-16 MED ORDER — CEFAZOLIN SODIUM-DEXTROSE 2-4 GM/100ML-% IV SOLN
2.0000 g | INTRAVENOUS | Status: AC
Start: 2017-09-16 — End: 2017-09-16
  Administered 2017-09-16: 2 g via INTRAVENOUS

## 2017-09-16 MED ORDER — ONDANSETRON HCL 4 MG/2ML IJ SOLN
INTRAMUSCULAR | Status: AC
  Filled 2017-09-16: qty 2

## 2017-09-16 MED ORDER — MORPHINE SULFATE (PF) 4 MG/ML IV SOLN
6.0000 mg | Freq: Once | INTRAVENOUS | Status: AC
  Administered 2017-09-16: 6 mg via INTRAMUSCULAR
  Filled 2017-09-16: qty 2

## 2017-09-16 MED ORDER — FENTANYL CITRATE (PF) 100 MCG/2ML IJ SOLN: 25.0000 ug | INTRAMUSCULAR | Status: DC | PRN

## 2017-09-16 MED ORDER — MAGNESIUM CITRATE PO SOLN
1.0000 | Freq: Once | ORAL | Status: DC | PRN
  Filled 2017-09-16: qty 296

## 2017-09-16 MED ORDER — VITAMIN B-1 100 MG PO TABS
100.0000 mg | ORAL_TABLET | Freq: Every day | ORAL | Status: DC
Start: 2017-09-16 — End: 2017-09-19
  Administered 2017-09-17 – 2017-09-19 (×3): 100 mg via ORAL
  Filled 2017-09-16 (×3): qty 1

## 2017-09-16 MED ORDER — DEXAMETHASONE SODIUM PHOSPHATE 10 MG/ML IJ SOLN
INTRAMUSCULAR | Status: AC
  Filled 2017-09-16: qty 1

## 2017-09-16 MED ORDER — ADULT MULTIVITAMIN W/MINERALS CH
1.0000 | ORAL_TABLET | Freq: Every day | ORAL | Status: DC
  Administered 2017-09-17 – 2017-09-19 (×3): 1 via ORAL
  Filled 2017-09-16 (×3): qty 1

## 2017-09-16 MED ORDER — THIAMINE HCL 100 MG/ML IJ SOLN: 100.0000 mg | Freq: Every day | INTRAMUSCULAR | Status: DC

## 2017-09-16 MED ORDER — INSULIN ASPART 100 UNIT/ML ~~LOC~~ SOLN
0.0000 [IU] | Freq: Three times a day (TID) | SUBCUTANEOUS | Status: DC
  Administered 2017-09-16: 1 [IU] via SUBCUTANEOUS
  Filled 2017-09-16: qty 1

## 2017-09-16 MED ORDER — BISACODYL 10 MG RE SUPP: 10.0000 mg | Freq: Every day | RECTAL | Status: DC | PRN

## 2017-09-16 MED ORDER — ACETAMINOPHEN 325 MG PO TABS: 650.0000 mg | ORAL_TABLET | Freq: Four times a day (QID) | ORAL | Status: DC | PRN

## 2017-09-16 MED ORDER — SODIUM CHLORIDE 0.9 % IR SOLN
Status: DC | PRN
  Administered 2017-09-16: 180 mL

## 2017-09-16 MED ORDER — FENTANYL CITRATE (PF) 100 MCG/2ML IJ SOLN
INTRAMUSCULAR | Status: AC
  Filled 2017-09-16: qty 2

## 2017-09-16 MED ORDER — MOMETASONE FURO-FORMOTEROL FUM 200-5 MCG/ACT IN AERO
2.0000 | INHALATION_SPRAY | Freq: Two times a day (BID) | RESPIRATORY_TRACT | Status: DC
  Administered 2017-09-16 – 2017-09-19 (×6): 2 via RESPIRATORY_TRACT
  Filled 2017-09-16: qty 8.8

## 2017-09-16 MED ORDER — PANTOPRAZOLE SODIUM 40 MG PO TBEC
40.0000 mg | DELAYED_RELEASE_TABLET | Freq: Every day | ORAL | Status: DC
  Administered 2017-09-17 – 2017-09-19 (×3): 40 mg via ORAL
  Filled 2017-09-16 (×3): qty 1

## 2017-09-16 MED ORDER — DOCUSATE SODIUM 100 MG PO CAPS
100.0000 mg | ORAL_CAPSULE | Freq: Two times a day (BID) | ORAL | Status: DC
  Administered 2017-09-16 – 2017-09-19 (×6): 100 mg via ORAL
  Filled 2017-09-16 (×6): qty 1

## 2017-09-16 MED ORDER — LORAZEPAM 1 MG PO TABS: 1.0000 mg | ORAL_TABLET | Freq: Four times a day (QID) | ORAL | Status: DC | PRN

## 2017-09-16 MED ORDER — EPINEPHRINE PF 1 MG/ML IJ SOLN
INTRAMUSCULAR | Status: AC
  Filled 2017-09-16: qty 1

## 2017-09-16 MED ORDER — PROPOFOL 10 MG/ML IV BOLUS
INTRAVENOUS | Status: AC
  Filled 2017-09-16: qty 20

## 2017-09-16 MED ORDER — ONDANSETRON HCL 4 MG/2ML IJ SOLN: 4.0000 mg | Freq: Once | INTRAMUSCULAR | Status: DC | PRN

## 2017-09-16 MED ORDER — SUCCINYLCHOLINE CHLORIDE 20 MG/ML IJ SOLN
INTRAMUSCULAR | Status: AC
  Filled 2017-09-16: qty 1

## 2017-09-16 MED ORDER — LIDOCAINE HCL (PF) 2 % IJ SOLN
INTRAMUSCULAR | Status: AC
  Filled 2017-09-16: qty 10

## 2017-09-16 MED ORDER — IPRATROPIUM-ALBUTEROL 0.5-2.5 (3) MG/3ML IN SOLN
3.0000 mL | Freq: Four times a day (QID) | RESPIRATORY_TRACT | Status: DC | PRN
  Administered 2017-09-17 – 2017-09-18 (×2): 3 mL via RESPIRATORY_TRACT
  Filled 2017-09-16 (×9): qty 3

## 2017-09-16 MED ORDER — FUROSEMIDE 40 MG PO TABS
40.0000 mg | ORAL_TABLET | Freq: Every day | ORAL | Status: DC
  Administered 2017-09-16 – 2017-09-19 (×4): 40 mg via ORAL
  Filled 2017-09-16 (×4): qty 1

## 2017-09-16 MED ORDER — FOLIC ACID 1 MG PO TABS
1.0000 mg | ORAL_TABLET | Freq: Every day | ORAL | Status: DC
  Administered 2017-09-17 – 2017-09-19 (×3): 1 mg via ORAL
  Filled 2017-09-16 (×3): qty 1

## 2017-09-16 MED ORDER — MORPHINE SULFATE (PF) 10 MG/ML IV SOLN
INTRAVENOUS | Status: AC
  Filled 2017-09-16: qty 1

## 2017-09-16 MED ORDER — MIDAZOLAM HCL 5 MG/5ML IJ SOLN
INTRAMUSCULAR | Status: DC | PRN
  Administered 2017-09-16 (×2): 1 mg via INTRAVENOUS

## 2017-09-16 MED ORDER — BUPIVACAINE HCL (PF) 0.5 % IJ SOLN
INTRAMUSCULAR | Status: AC
  Filled 2017-09-16: qty 10

## 2017-09-16 MED ORDER — ACETAMINOPHEN 10 MG/ML IV SOLN
INTRAVENOUS | Status: DC | PRN
  Administered 2017-09-16: 1000 mg via INTRAVENOUS

## 2017-09-16 MED ORDER — KETAMINE HCL 50 MG/ML IJ SOLN
INTRAMUSCULAR | Status: DC | PRN
  Administered 2017-09-16 (×2): 25 mg via INTRAMUSCULAR

## 2017-09-16 MED ORDER — LORAZEPAM 2 MG/ML IJ SOLN: 1.0000 mg | Freq: Four times a day (QID) | INTRAMUSCULAR | Status: DC | PRN

## 2017-09-16 MED ORDER — SODIUM CHLORIDE 0.9 % IJ SOLN
INTRAMUSCULAR | Status: AC
  Filled 2017-09-16: qty 10

## 2017-09-16 MED ORDER — ONDANSETRON HCL 4 MG/2ML IJ SOLN
4.0000 mg | Freq: Four times a day (QID) | INTRAMUSCULAR | Status: DC | PRN
Start: 2017-09-16 — End: 2017-09-16

## 2017-09-16 MED ORDER — PROPOFOL 500 MG/50ML IV EMUL
INTRAVENOUS | Status: DC | PRN
  Administered 2017-09-16: 25 ug/kg/min via INTRAVENOUS

## 2017-09-16 MED ORDER — METHYLPREDNISOLONE SODIUM SUCC 125 MG IJ SOLR
INTRAMUSCULAR | Status: AC
  Filled 2017-09-16: qty 2

## 2017-09-16 MED ORDER — METHOCARBAMOL 500 MG PO TABS
500.0000 mg | ORAL_TABLET | Freq: Three times a day (TID) | ORAL | Status: DC | PRN
  Administered 2017-09-17 – 2017-09-18 (×4): 500 mg via ORAL
  Filled 2017-09-16 (×4): qty 1

## 2017-09-16 MED ORDER — ZOLPIDEM TARTRATE 5 MG PO TABS
5.0000 mg | ORAL_TABLET | Freq: Every evening | ORAL | Status: DC | PRN
  Administered 2017-09-16 – 2017-09-18 (×2): 5 mg via ORAL
  Filled 2017-09-16 (×2): qty 1

## 2017-09-16 MED ORDER — ROCURONIUM BROMIDE 50 MG/5ML IV SOLN
INTRAVENOUS | Status: AC
  Filled 2017-09-16: qty 1

## 2017-09-16 MED ORDER — ACETAMINOPHEN 325 MG PO TABS
650.0000 mg | ORAL_TABLET | Freq: Four times a day (QID) | ORAL | Status: DC | PRN
  Administered 2017-09-18 (×2): 650 mg via ORAL
  Filled 2017-09-16 (×2): qty 2

## 2017-09-16 MED ORDER — SOTALOL HCL 80 MG PO TABS
80.0000 mg | ORAL_TABLET | Freq: Two times a day (BID) | ORAL | Status: DC
  Administered 2017-09-16 – 2017-09-19 (×5): 80 mg via ORAL
  Filled 2017-09-16 (×8): qty 1

## 2017-09-16 MED ORDER — METHOCARBAMOL 1000 MG/10ML IJ SOLN
500.0000 mg | Freq: Four times a day (QID) | INTRAVENOUS | Status: DC | PRN
  Filled 2017-09-16: qty 5

## 2017-09-16 MED ORDER — INSULIN ASPART 100 UNIT/ML ~~LOC~~ SOLN
0.0000 [IU] | Freq: Three times a day (TID) | SUBCUTANEOUS | Status: DC
  Administered 2017-09-16: 7 [IU] via SUBCUTANEOUS
  Administered 2017-09-17: 2 [IU] via SUBCUTANEOUS
  Administered 2017-09-17: 5 [IU] via SUBCUTANEOUS
  Administered 2017-09-17 (×2): 3 [IU] via SUBCUTANEOUS
  Administered 2017-09-18: 2 [IU] via SUBCUTANEOUS
  Administered 2017-09-18: 3 [IU] via SUBCUTANEOUS
  Administered 2017-09-18: 2 [IU] via SUBCUTANEOUS
  Administered 2017-09-19: 3 [IU] via SUBCUTANEOUS
  Administered 2017-09-19: 2 [IU] via SUBCUTANEOUS
  Filled 2017-09-16 (×10): qty 1

## 2017-09-16 MED ORDER — FENTANYL CITRATE (PF) 100 MCG/2ML IJ SOLN
INTRAMUSCULAR | Status: DC | PRN
  Administered 2017-09-16 (×2): 50 ug via INTRAVENOUS

## 2017-09-16 MED ORDER — BUPIVACAINE HCL (PF) 0.5 % IJ SOLN
INTRAMUSCULAR | Status: DC | PRN
  Administered 2017-09-16: 3 mL

## 2017-09-16 MED ORDER — ONDANSETRON HCL 4 MG/2ML IJ SOLN: 4.0000 mg | Freq: Four times a day (QID) | INTRAMUSCULAR | Status: DC | PRN

## 2017-09-16 MED ORDER — NICOTINE 21 MG/24HR TD PT24
21.0000 mg | MEDICATED_PATCH | Freq: Every day | TRANSDERMAL | Status: DC
  Administered 2017-09-17 – 2017-09-19 (×3): 21 mg via TRANSDERMAL
  Filled 2017-09-16 (×3): qty 1

## 2017-09-16 SURGICAL SUPPLY — 42 items
BIT DRILL 4.3MMS DISTAL GRDTED (BIT) ×1 IMPLANT
BNDG COHESIVE 6X5 TAN STRL LF (GAUZE/BANDAGES/DRESSINGS) ×9 IMPLANT
CANISTER SUCT 1200ML W/VALVE (MISCELLANEOUS) ×3 IMPLANT
CORTICAL BONE SCR 5.0MM X 56MM (Screw) ×3 IMPLANT
DRAPE SHEET LG 3/4 BI-LAMINATE (DRAPES) ×6 IMPLANT
DRAPE SURG 17X11 SM STRL (DRAPES) ×6 IMPLANT
DRAPE U-SHAPE 47X51 STRL (DRAPES) ×6 IMPLANT
DRILL 4.3MMS DISTAL GRADUATED (BIT) ×3
DRSG OPSITE POSTOP 3X4 (GAUZE/BANDAGES/DRESSINGS) ×6 IMPLANT
DRSG OPSITE POSTOP 4X14 (GAUZE/BANDAGES/DRESSINGS) ×3 IMPLANT
DRSG OPSITE POSTOP 4X6 (GAUZE/BANDAGES/DRESSINGS) ×3 IMPLANT
DURAPREP 26ML APPLICATOR (WOUND CARE) ×6 IMPLANT
ELECT REM PT RETURN 9FT ADLT (ELECTROSURGICAL) ×3
ELECTRODE REM PT RTRN 9FT ADLT (ELECTROSURGICAL) ×1 IMPLANT
EVACUATOR 1/8 PVC DRAIN (DRAIN) ×3 IMPLANT
GLOVE BIOGEL PI IND STRL 7.0 (GLOVE) ×4 IMPLANT
GLOVE BIOGEL PI IND STRL 9 (GLOVE) ×1 IMPLANT
GLOVE BIOGEL PI INDICATOR 7.0 (GLOVE) ×8
GLOVE BIOGEL PI INDICATOR 9 (GLOVE) ×2
GLOVE SURG 9.0 ORTHO LTXF (GLOVE) ×6 IMPLANT
GOWN STRL REUS TWL 2XL XL LVL4 (GOWN DISPOSABLE) ×3 IMPLANT
GOWN STRL REUS W/ TWL LRG LVL3 (GOWN DISPOSABLE) ×3 IMPLANT
GOWN STRL REUS W/TWL LRG LVL3 (GOWN DISPOSABLE) ×6
GUIDEPIN VERSANAIL DSP 3.2X444 (ORTHOPEDIC DISPOSABLE SUPPLIES) ×3 IMPLANT
GUIDEWIRE BALL NOSE 100CM (WIRE) ×3 IMPLANT
HFN LAG SCREW 10.5MM X 115MM (Orthopedic Implant) ×3 IMPLANT
KIT RM TURNOVER CYSTO AR (KITS) ×3 IMPLANT
MAT BLUE FLOOR 46X72 FLO (MISCELLANEOUS) ×3 IMPLANT
NAIL HIP FRAC RT 130 11MX400M (Nail) ×3 IMPLANT
NS IRRIG 1000ML POUR BTL (IV SOLUTION) ×3 IMPLANT
PACK HIP COMPR (MISCELLANEOUS) ×3 IMPLANT
SCREW BONE CORTICAL 5.0X52 (Screw) ×3 IMPLANT
SCREW CORTICAL 5.0MMX60M (Screw) ×3 IMPLANT
STAPLER SKIN PROX 35W (STAPLE) ×3 IMPLANT
SUCTION FRAZIER HANDLE 10FR (MISCELLANEOUS) ×2
SUCTION TUBE FRAZIER 10FR DISP (MISCELLANEOUS) ×1 IMPLANT
SUT VIC AB 0 CT1 36 (SUTURE) ×6 IMPLANT
SUT VIC AB 2-0 CT1 27 (SUTURE) ×2
SUT VIC AB 2-0 CT1 TAPERPNT 27 (SUTURE) ×1 IMPLANT
SUT VICRYL 0 AB UR-6 (SUTURE) ×3 IMPLANT
SUT VICRYL 0 UR6 27IN ABS (SUTURE) ×3 IMPLANT
SYR 30ML LL (SYRINGE) ×3 IMPLANT

## 2017-09-16 NOTE — ED Provider Notes (Signed)
[facility] Emergency Department Provider Note  ____________________________________________   I have reviewed the triage vital signs and the nursing notes.   HISTORY  Chief Complaint Fall and Hip Injury    HPI Marvin Lewis is a 66 y.o. male who presents today complaining of hip pain after a nonsurgical fall. He was walking and fell. He is no longer on anticoagulation, he did not his head did not pass out but could not get up use down on the floor for a few hours. He denies any fever or chills. He denies any chest pain shortness of breath or syncopal symptoms. pain is sharp, nonradiating, worse when he tries to move a  Past Medical History:  Diagnosis Date  . A-fib ([place])   . Alcohol abuse   . Alcohol use   . Aortic aneurysm ([place]) 06/19/2017   Ascending 4.4 cm, CT scan July 2018  . Aortic aneurysm, intrathoracic ([place])   . Ascending aortic aneurysm ([place])   . Ascending aortic aneurysm ([place])   . Atrial fibrillation ([place]) 05/08/2016  . Broken rib April 2016   History of broken ribs x 3  . Bronchitis   . CAD (coronary artery disease)   . CHF (congestive heart failure) ([place])   . Chronic alcoholism ([place])   . Cirrhosis ([place])   . Closed nondisplaced fracture of fifth metatarsal bone of left foot Nov. 2016   left  . COPD (chronic obstructive pulmonary disease) ([place])   . Depression   . Diabetes mellitus without complication ([place])   . Emphysema of lung ([place])   . Esophageal rupture   . GERD (gastroesophageal reflux disease)   . Hepatitis C   . Hip arthritis   . History of diverticulitis    ruptured and sepsis  . History of hiatal hernia   . History of smoking   . HOH (hard of hearing)   . Hyperlipidemia   . Hypertension   . Hypoxemia   . Insomnia   . Movement disorder   . Onychogryphosis   . Pneumonia   . Rib fracture   . Seasonal allergies   . Shortness of breath dyspnea   . Sleep apnea    uses O2 @2  L/min at night  . Stroke ([place])   .  Thrombocytopenia [facility])     Patient Active Problem List   Diagnosis Date Noted  . Acute respiratory distress   . Acute on chronic congestive heart failure ([place])   . Palliative care by specialist   . Acute respiratory failure ([place]) 09/03/2017  . Aortic aneurysm ([place]) 06/19/2017  . Hemoptysis 06/17/2017  . A-fib ([place]) 05/21/2016  . Leg cramps 05/08/2016  . Anemia 05/08/2016  . Atrial fibrillation ([place]) 05/08/2016  . Abnormal weight gain 04/22/2016  . Goals of care, counseling/discussion 04/22/2016  . Elevated brain natriuretic peptide (BNP) level 04/22/2016  . Persistent atrial fibrillation ([place]) 03/29/2016  . Breathlessness on exertion 03/27/2016  . Gout 02/23/2016  . Hyperkalemia 01/12/2016  . Onychomycosis 01/10/2016  . Hoarseness of voice 01/10/2016  . Tobacco abuse 01/10/2016  . Hx of completed stroke 01/10/2016  . Athletes foot 08/09/2015  . Chronic alcoholism ([place])   . Movement disorder   . COPD (chronic obstructive pulmonary disease) ([place])   . Emphysema of lung ([place])   . Type II diabetes mellitus, uncontrolled ([place])   . Hyperlipidemia   . Hypertension   . CAD (coronary artery disease)   . Hepatitis C   . Cirrhosis ([place])   . Alcohol  abuse   . Onychogryphosis   . Insomnia   . Hip arthritis   . Hypoxemia   . 1st degree AV block   . COPD exacerbation ([place]) 05/26/2015  . Controlled type 2 diabetes mellitus without complication ([place]) 72/62/0355    Past Surgical History:  Procedure Laterality Date  . CATARACT EXTRACTION W/PHACO Right 01/16/2016   Procedure: CATARACT EXTRACTION PHACO AND INTRAOCULAR LENS PLACEMENT (IOC);  Surgeon: Birder Robson, MD;  Location: [facility] ORS;  Service: Ophthalmology;  Laterality: Right;  [place]: 00:45.0 AP%: 25.2 CDE: 11.36  Lot #[id number]  . CHOLECYSTECTOMY    . COLON RESECTION    . COLONOSCOPY WITH PROPOFOL N/A 08/27/2016   Procedure: COLONOSCOPY WITH PROPOFOL;  Surgeon: Lollie Sails, MD;  Location: [facility] ENDOSCOPY;  Service:  Endoscopy;  Laterality: N/A;  . ELECTROPHYSIOLOGIC STUDY N/A 05/22/2016   Procedure: CARDIOVERSION;  Surgeon: Teodoro Spray, MD;  Location: [facility] ORS;  Service: Cardiovascular;  Laterality: N/A;  . HERNIA REPAIR    . PEG PLACEMENT N/A   . PEG TUBE REMOVAL N/A   . PILONIDAL CYST EXCISION N/A   . TRACHEOSTOMY N/A     Prior to Admission medications   Medication Sig Start Date End Date Taking? Authorizing Provider  amLODipine (NORVASC) 5 MG tablet Take 5 mg by mouth daily. 06/13/17  Yes [provider]  atorvastatin (LIPITOR) 10 MG tablet Take 1 tablet (10 mg total) by mouth at bedtime. 11/09/16  Yes Lada, Satira Anis, MD  B-D INS SYRINGE 0.5CC/30GX1/2" 30G X 1/2" 0.5 ML MISC AS DIRECTED. 07/17/15  Yes Crissman, Jeannette How, MD  benazepril (LOTENSIN) 10 MG tablet Take 10 mg by mouth daily.    Yes [provider]  docusate sodium (COLACE) 100 MG capsule Take 1 capsule (100 mg total) by mouth 2 (two) times daily. Patient taking differently: Take 100 mg by mouth daily.  09/05/17  Yes Gouru, Illene Silver, MD  ferrous sulfate 325 (65 FE) MG tablet Take 1 tablet (325 mg total) by mouth 2 (two) times daily with a meal. 09/05/17 09/05/18 Yes Gouru, Aruna, MD  fluticasone-salmeterol (ADVAIR HFA) 115-21 MCG/ACT inhaler Inhale 2 puffs into the lungs every 12 (twelve) hours.   Yes [provider]  folic acid (FOLVITE) 1 MG tablet Take 1 tablet (1 mg total) by mouth daily. 09/06/17  Yes Gouru, Illene Silver, MD  furosemide (LASIX) 20 MG tablet Take 2 tablets (40 mg total) by mouth daily. 09/05/17  Yes Gouru, Aruna, MD  insulin degludec (TRESIBA) 100 UNIT/ML SOPN FlexTouch Pen Inject 48 Units into the skin at bedtime.   Yes [provider]  ipratropium-albuterol (DUONEB) 0.5-2.5 (3) MG/3ML SOLN Take 3 mLs by nebulization every 6 (six) hours as needed. For shortness of breath/wheezing   Yes [provider]  nicotine (NICODERM CQ - DOSED IN MG/24 HOURS) 21 mg/24hr patch Place 1 patch (21 mg total)  onto the skin daily. 09/06/17  Yes Gouru, Illene Silver, MD  omeprazole (PRILOSEC) 20 MG capsule Take 20 mg by mouth daily.   Yes [provider]  sotalol (BETAPACE) 80 MG tablet Take 1 tablet (80 mg total) by mouth 2 (two) times daily. 05/22/16  Yes Teodoro Spray, MD  zolpidem (AMBIEN) 10 MG tablet Take 10 mg by mouth at bedtime as needed for sleep.    Yes [provider]  albuterol (PROVENTIL HFA;VENTOLIN HFA) 108 (90 Base) MCG/ACT inhaler Inhale 2 puffs into the lungs every 6 (six) hours as needed for wheezing or shortness of breath. Patient not taking:  Reported on 09/16/2017 09/05/17   Nicholes Mango, MD  amoxicillin-clavulanate (AUGMENTIN) 500-125 MG tablet Take 1 tablet (500 mg total) by mouth 3 (three) times daily. Patient not taking: Reported on 09/16/2017 09/05/17   Nicholes Mango, MD  guaiFENesin-dextromethorphan (ROBITUSSIN DM) 100-10 MG/5ML syrup Take 10 mLs by mouth every 6 (six) hours as needed for cough. Patient not taking: Reported on 09/16/2017 09/05/17   Nicholes Mango, MD    Allergies Patient has no known allergies.  Family History  Problem Relation Age of Onset  . Coronary artery disease Mother   . Hypertension Mother   . Heart disease Mother   . Lung disease Mother   . Coronary artery disease Father   . Hypertension Father   . Diabetes Father   . Heart disease Father   . Cancer Father        prostate  . Alcohol abuse Brother     Social History Social History  Substance Use Topics  . Smoking status: Current Every Day Smoker    Packs/day: 0.25    Years: 40.00    Types: Cigarettes  . Smokeless tobacco: Never Used  . Alcohol use Yes     Comment: Per wife, pt is alcoholic    Review of Systems Constitutional: No fever/chills Eyes: No visual changes. ENT: No sore throat. No stiff neck no neck pain Cardiovascular: Denies chest pain. Respiratory: Denies shortness of breath. Gastrointestinal:   no vomiting.  No diarrhea.  No constipation. Genitourinary:  Negative for dysuria. Musculoskeletal: Negative lower extremity swelling Skin: Negative for rash. Neurological: Negative for severe headaches, focal weakness or numbness.   ____________________________________________   PHYSICAL EXAM:  VITAL SIGNS: ED Triage Vitals  Enc Vitals Group     BP 09/16/17 0659 (!) 164/92     Pulse Rate 09/16/17 0659 69     Resp 09/16/17 0659 (!) 25     Temp 09/16/17 0659 97.9 F (36.6 C)     Temp Source 09/16/17 0659 Oral     SpO2 09/16/17 0653 99 %     Weight 09/16/17 0700 238 lb (108 kg)     Height 09/16/17 0700 5\' 11"  (1.803 m)     Head Circumference --      Peak Flow --      Pain Score 09/16/17 0656 9     Pain Loc --      Pain Edu? --      Excl. in [place]? --     Constitutional: Alert and oriented. Well appearing and in no acute distress. Eyes: Conjunctivae are normal Head: Atraumatic HEENT: No congestion/rhinnorhea. Mucous membranes are moist.  Oropharynx non-erythematous Neck:   Nontender with no meningismus, no masses, no stridor Cardiovascular: Normal rate, regular rhythm. Grossly normal heart sounds.  Good peripheral circulation. Respiratory: Normal respiratory effort.  No retractions. Lungs CTAB. Abdominal: Soft and nontender. No distention. No guarding no rebound Back:  There is no focal tenderness or step off.  there is no midline tenderness there are no lesions noted. there is no CVA tenderness Musculoskeletal:times palpation to the right hip with no significant deformityno upper extremity tenderness. No joint effusions, no DVT signs strong distal pulses no edema Neurologic:  Normal speech and language. No gross focal neurologic deficits are appreciated.  Skin:  Skin is warm, dry and intact. No rash noted. Psychiatric: Mood and affect are normal. Speech and behavior are normal.  ____________________________________________   LABS (all labs ordered are listed, but only abnormal results are displayed)  Labs Reviewed  CBC  WITH  DIFFERENTIAL/PLATELET  COMPREHENSIVE METABOLIC PANEL  ETHANOL  CK  PROTIME-INR  URINALYSIS, COMPLETE (UACMP) WITH MICROSCOPIC  TYPE AND SCREEN    Pertinent labs  results that were available during my care of the patient were reviewed by me and considered in my medical decision making (see chart for details). ____________________________________________  EKG  I personally interpreted any EKGs ordered by me or triage sinus rhythm at 6 2 bpm, normal axis no acute ST elevation or depression unremarkable EKG ____________________________________________  RADIOLOGY  Pertinent labs & imaging results that were available during my care of the patient were reviewed by me and considered in my medical decision making (see chart for details). If possible, patient and/or family made aware of any abnormal findings. ____________________________________________    PROCEDURES  Procedure(s) performed: None  Procedures  Critical Care performed: None  ____________________________________________   INITIAL IMPRESSION / ASSESSMENT AND PLAN / ED COURSE  Pertinent labs & imaging results that were available during my care of the patient were reviewed by me and considered in my medical decision making (see chart for details).  patient here with a right hip fracture clinically which is been verified by x-ray. Patient has very poor vascular access. I'll truly did do an EJ in the right, on the second pass. Patient tolerated well and no evidence of complications. It flushes well. We will continue to give him pain medication. He has no other complaints at this time he will require admission. Because he fell, even though he is no longer on Coumadin, I did do a CT scan of his head which is reassuring.    ____________________________________________   FINAL CLINICAL IMPRESSION(S) / ED DIAGNOSES  Final diagnoses:  Closed fracture of right hip, initial encounter [facility])      This chart was dictated  using voice recognition software.  Despite best efforts to proofread,  errors can occur which can change meaning.      Schuyler Amor, MD 09/16/17 [phone]

## 2017-09-16 NOTE — ED Triage Notes (Signed)
Pt walking in dark in home and ran into a closed door. Pt fell, ijnjuring R hip, estimated time down 4 hours.

## 2017-09-16 NOTE — ED Notes (Signed)
Lab to bedside at this time. 

## 2017-09-16 NOTE — Progress Notes (Signed)
Pt arrived from [facility]. AAOx4. 3 L O2 - chronic at home at night. VSS. Pain 7/10. Right hip honeycomb dsgs x3 ddi. Wife at bedside. Wife states pt is actively drinking at home and requests ETOH protocol. Notified md. Awaiting orders. Will cont to monitor. Pt placed on cardiac monitor.

## 2017-09-16 NOTE — Progress Notes (Signed)
Pts. fsbs is 345. Dr. Jannifer Franklin notified and putting in new orders for coverage.

## 2017-09-16 NOTE — H&P (Signed)
[facility] NAME: Marvin Lewis    MR#:  [id number]  DATE OF BIRTH:  Jul 15, 1951  DATE OF ADMISSION:  09/16/2017  PRIMARY CARE PHYSICIAN: Laverle Hobby, MD   REQUESTING/REFERRING PHYSICIAN: Dr. Charlotte Crumb  CHIEF COMPLAINT:   Chief Complaint  Patient presents with  . Fall  . Hip Injury    HISTORY OF PRESENT ILLNESS:  Marvin Lewis  is a 66 y.o. male with a known history of Chronic atrial fibrillation, alcohol abuse, COPD with ongoing tobacco abuse, history of coronary artery disease, CHF, attention, hyperlipidemia, depression who was recently admitted to the hospital due to COPD exacerbation and pneumonia now returns after a mechanical fall and noted to have a right hip fracture. Patient says he was up late at night watching TV and attempted to walk from his living room to his bedroom and he fell hit the door of his bedroom. He didn't hit his head but did not lose consciousness. Patient did not have any prodromal symptoms like chest pain palpitations dizziness nausea vomiting or any other associated symptoms. He was having significant pain in his right groin area and his right leg and therefore came to the ER for further evaluation. Patient CT head is negative for acute pathology but x-rays of his pelvis and right hip area showed a right-sided intertrochanteric fracture. Hospitalist services were contacted further treatment and evaluation.  PAST MEDICAL HISTORY:   Past Medical History:  Diagnosis Date  . A-fib ([place])   . Alcohol abuse   . Alcohol use   . Aortic aneurysm ([place]) 06/19/2017   Ascending 4.4 cm, CT scan July 2018  . Aortic aneurysm, intrathoracic ([place])   . Ascending aortic aneurysm ([place])   . Ascending aortic aneurysm ([employer])   . Atrial fibrillation ([place]) 05/08/2016  . Broken rib April 2016   History of broken ribs x 3  . Bronchitis   . CAD (coronary artery disease)   . CHF (congestive heart failure)  ([place])   . Chronic alcoholism ([place])   . Cirrhosis ([place])   . Closed nondisplaced fracture of fifth metatarsal bone of left foot Nov. 2016   left  . COPD (chronic obstructive pulmonary disease) ([place])   . Depression   . Diabetes mellitus without complication ([place])   . Emphysema of lung ([place])   . Esophageal rupture   . GERD (gastroesophageal reflux disease)   . Hepatitis C   . Hip arthritis   . History of diverticulitis    ruptured and sepsis  . History of hiatal hernia   . History of smoking   . HOH (hard of hearing)   . Hyperlipidemia   . Hypertension   . Hypoxemia   . Insomnia   . Movement disorder   . Onychogryphosis   . Pneumonia   . Rib fracture   . Seasonal allergies   . Shortness of breath dyspnea   . Sleep apnea    uses O2 @2  L/min at night  . Stroke ([place])   . Thrombocytopenia ([place])     PAST SURGICAL HISTORY:   Past Surgical History:  Procedure Laterality Date  . CATARACT EXTRACTION W/PHACO Right 01/16/2016   Procedure: CATARACT EXTRACTION PHACO AND INTRAOCULAR LENS PLACEMENT (IOC);  Surgeon: Birder Robson, MD;  Location: [facility] ORS;  Service: Ophthalmology;  Laterality: Right;  [place]: 00:45.0 AP%: 25.2 CDE: 11.36  Lot #[id number]  . CHOLECYSTECTOMY    . COLON RESECTION    . COLONOSCOPY WITH PROPOFOL N/A 08/27/2016  Procedure: COLONOSCOPY WITH PROPOFOL;  Surgeon: Lollie Sails, MD;  [facility] ENDOSCOPY;  Service: Endoscopy;  Laterality: N/A;  . ELECTROPHYSIOLOGIC STUDY N/A 05/22/2016   Procedure: CARDIOVERSION;  Surgeon: Teodoro Spray, MD;  Location: [facility] ORS;  Service: Cardiovascular;  Laterality: N/A;  . HERNIA REPAIR    . PEG PLACEMENT N/A   . PEG TUBE REMOVAL N/A   . PILONIDAL CYST EXCISION N/A   . TRACHEOSTOMY N/A     SOCIAL HISTORY:   Social History  Substance Use Topics  . Smoking status: Current Every Day Smoker    Packs/day: 0.25    Years: 40.00    Types: Cigarettes  . Smokeless tobacco: Never Used  . Alcohol use Yes     Comment: Per  wife, pt is alcoholic    FAMILY HISTORY:   Family History  Problem Relation Age of Onset  . Coronary artery disease Mother   . Hypertension Mother   . Heart disease Mother   . Lung disease Mother   . Coronary artery disease Father   . Hypertension Father   . Diabetes Father   . Heart disease Father   . Cancer Father        prostate  . Alcohol abuse Brother     DRUG ALLERGIES:  No Known Allergies  REVIEW OF SYSTEMS:   Review of Systems  Constitutional: Negative for fever and weight loss.  HENT: Negative for congestion, nosebleeds and tinnitus.   Eyes: Negative for blurred vision, double vision and redness.  Respiratory: Negative for cough, hemoptysis and shortness of breath.   Cardiovascular: Negative for chest pain, orthopnea, leg swelling and PND.  Gastrointestinal: Negative for abdominal pain, diarrhea, melena, nausea and vomiting.  Genitourinary: Negative for dysuria, hematuria and urgency.  Musculoskeletal: Positive for falls and joint pain (Right Hip).  Neurological: Negative for dizziness, tingling, sensory change, focal weakness, seizures, weakness and headaches.  Endo/Heme/Allergies: Negative for polydipsia. Does not bruise/bleed easily.  Psychiatric/Behavioral: Negative for depression and memory loss. The patient is not nervous/anxious.     MEDICATIONS AT HOME:   Prior to Admission medications   Medication Sig Start Date End Date Taking? Authorizing Provider  amLODipine (NORVASC) 5 MG tablet Take 5 mg by mouth daily. 06/13/17  Yes [provider]  atorvastatin (LIPITOR) 10 MG tablet Take 1 tablet (10 mg total) by mouth at bedtime. 11/09/16  Yes Lada, Satira Anis, MD  B-D INS SYRINGE 0.5CC/30GX1/2" 30G X 1/2" 0.5 ML MISC AS DIRECTED. 07/17/15  Yes Crissman, Jeannette How, MD  benazepril (LOTENSIN) 10 MG tablet Take 10 mg by mouth daily.    Yes [provider]  docusate sodium (COLACE) 100 MG capsule Take 1 capsule (100 mg total) by mouth 2 (two) times  daily. Patient taking differently: Take 100 mg by mouth daily.  09/05/17  Yes Gouru, Illene Silver, MD  ferrous sulfate 325 (65 FE) MG tablet Take 1 tablet (325 mg total) by mouth 2 (two) times daily with a meal. 09/05/17 09/05/18 Yes Gouru, Aruna, MD  fluticasone-salmeterol (ADVAIR HFA) 115-21 MCG/ACT inhaler Inhale 2 puffs into the lungs every 12 (twelve) hours.   Yes [provider]  folic acid (FOLVITE) 1 MG tablet Take 1 tablet (1 mg total) by mouth daily. 09/06/17  Yes Gouru, Illene Silver, MD  furosemide (LASIX) 20 MG tablet Take 2 tablets (40 mg total) by mouth daily. 09/05/17  Yes Gouru, Aruna, MD  insulin degludec (TRESIBA) 100 UNIT/ML SOPN FlexTouch Pen Inject 48 Units into the skin at bedtime.  Yes [provider]  ipratropium-albuterol (DUONEB) 0.5-2.5 (3) MG/3ML SOLN Take 3 mLs by nebulization every 6 (six) hours as needed. For shortness of breath/wheezing   Yes [provider]  nicotine (NICODERM CQ - DOSED IN MG/24 HOURS) 21 mg/24hr patch Place 1 patch (21 mg total) onto the skin daily. 09/06/17  Yes Gouru, Illene Silver, MD  omeprazole (PRILOSEC) 20 MG capsule Take 20 mg by mouth daily.   Yes [provider]  sotalol (BETAPACE) 80 MG tablet Take 1 tablet (80 mg total) by mouth 2 (two) times daily. 05/22/16  Yes Teodoro Spray, MD  zolpidem (AMBIEN) 10 MG tablet Take 10 mg by mouth at bedtime as needed for sleep.    Yes [provider]  albuterol (PROVENTIL HFA;VENTOLIN HFA) 108 (90 Base) MCG/ACT inhaler Inhale 2 puffs into the lungs every 6 (six) hours as needed for wheezing or shortness of breath. Patient not taking: Reported on 09/16/2017 09/05/17   Nicholes Mango, MD  amoxicillin-clavulanate (AUGMENTIN) 500-125 MG tablet Take 1 tablet (500 mg total) by mouth 3 (three) times daily. Patient not taking: Reported on 09/16/2017 09/05/17   Nicholes Mango, MD  guaiFENesin-dextromethorphan (ROBITUSSIN DM) 100-10 MG/5ML syrup Take 10 mLs by mouth every 6 (six) hours as needed for  cough. Patient not taking: Reported on 09/16/2017 09/05/17   Nicholes Mango, MD      VITAL SIGNS:  Blood pressure (!) 147/75, pulse 69, temperature 97.9 F (36.6 C), temperature source Oral, resp. rate 17, height 5\' 11"  (1.803 m), weight 108 kg (238 lb), SpO2 100 %.  PHYSICAL EXAMINATION:  Physical Exam  GENERAL:  66 y.o.-year-old patient lying in the bed in no acute distress.  EYES: Pupils equal, round, reactive to light and accommodation. No scleral icterus. Extraocular muscles intact.  HEENT: Head atraumatic, normocephalic. Oropharynx and nasopharynx clear. No oropharyngeal erythema, moist oral mucosa  NECK:  Supple, no jugular venous distention. No thyroid enlargement, no tenderness.  LUNGS: Normal breath sounds bilaterally, no wheezing, rales, rhonchi. No use of accessory muscles of respiration.  CARDIOVASCULAR: S1, S2 RRR. No murmurs, rubs, gallops, clicks.  ABDOMEN: Soft, nontender, nondistended. Bowel sounds present. No organomegaly or mass.  EXTREMITIES: No pedal edema, cyanosis, or clubbing. + 2 pedal & radial pulses b/l.  Right lower extremity shortening and external rotated due to the hip fracture. NEUROLOGIC: Cranial nerves II through XII are intact. No focal Motor or sensory deficits appreciated b/l.  Globally weak PSYCHIATRIC: The patient is alert and oriented x 3.  SKIN: No obvious rash, lesion, or ulcer.   LABORATORY PANEL:   CBC  Recent Labs Lab 09/16/17 0836  WBC 12.4*  HGB 9.8*  HCT 33.7*  PLT 105*   ------------------------------------------------------------------------------------------------------------------  Chemistries   Recent Labs Lab 09/16/17 0836  NA 140  K 3.9  CL 102  CO2 27  GLUCOSE 200*  BUN 25*  CREATININE 0.96  CALCIUM 8.7*  AST 97*  ALT 106*  ALKPHOS 87  BILITOT 0.9   ------------------------------------------------------------------------------------------------------------------  Cardiac Enzymes No results for input(s):  TROPONINI in the last 168 hours. ------------------------------------------------------------------------------------------------------------------  RADIOLOGY:  Ct Head Wo Contrast  Result Date: 09/16/2017 CLINICAL DATA:  Hit head, fall. EXAM: CT HEAD WITHOUT CONTRAST TECHNIQUE: Contiguous axial images were obtained from the base of the skull through the vertex without intravenous contrast. COMPARISON:  MRI 05/05/2015.  CT 07/14/2012. 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 Mild cerebral atrophy. No acute intracranial abnormality. Specifically, no hemorrhage, hydrocephalus, mass lesion, acute infarction, or significant intracranial injury.  Vascular: No hyperdense vessel or unexpected calcification. Skull: No acute calvarial abnormality. Sinuses/Orbits: Visualized paranasal sinuses and mastoids clear. Orbital soft tissues unremarkable. Other: None IMPRESSION: No acute intracranial abnormality. Electronically Signed   By: Rolm Baptise M.D.   On: 09/16/2017 07:47   Dg Hip Unilat  With Pelvis 2-3 Views Right  Result Date: 09/16/2017 CLINICAL DATA:  Injury.  Fall. EXAM: DG HIP (WITH OR WITHOUT PELVIS) 2-3V RIGHT COMPARISON:  No recent. FINDINGS: Right intertrochanteric fracture noted of the right femur. Displaced fracture of the lesser trochanter noted. Plate and screw fixation left hip. Degenerative changes both hips . Aortoiliac and peripheral vascular calcification. Surgical clips in the pelvis. IMPRESSION: 1. Right intertrochanteric proximal femoral fracture. Displaced fracture of the lesser trochanter noted. 2. Wire plate and screw fixation left hip. 3. Aorto iliac and peripheral vascular disease . Electronically Signed   By: Marcello Moores  Register   On: 09/16/2017 08:13     IMPRESSION AND PLAN:   66 year old male with past medical history of hypertension, hyperlipidemia, chronic atrial fibrillation, alcohol abuse, COPD, CHF who presents to the hospital after a mechanical fall and  noted to have a right hip fracture.  1. Status post fall and right hip fracture-patient is a moderate risk for noncardiac surgery. No absolute contraindication surgery at this time. EKG has been reviewed shows normal sinus rhythm with no acute ST-T wave changes. -We'll consult orthopedics, discussed with Dr. Mack Guise. Supportive care with pain control until seen by orthopedics. - will keep NPO in case pt. Goes to surgery today.   2. Diabetes type 2 without complication-hold patient's scheduled insulin as patient's going to be nothing by mouth. Place on sliding scale insulin. Follow blood sugars.  3. Essential hypertension-continue Norvasc, benazepril.  4. History of chronic atrial fibrillation-rate controlled. Continue sotalol. Patient is not on long-term coagulation due to history of alcohol abuse and falls.  5. COPD-no acute exacerbation-continue Dulera, DuoNeb nebs as needed.  6. GERD-continue Protonix.  7. Anemia of chronic disease-secondary to alcohol abuse. Hemoglobin currently stable note daily for transfusion will continue to monitor.  8. History of chronic systolic CHF-clinically patient is not in congestive heart failure. Continue sotalol, benazepril, Lasix.  9. Tobacco abuse-continue nicotine patch.  All the records are reviewed and case discussed with ED provider. Management plans discussed with the patient, family and they are in agreement.  CODE STATUS: Full code  TOTAL TIME TAKING CARE OF THIS PATIENT: 45 minutes.    Henreitta Leber M.D on 09/16/2017 at 9:41 AM  Between 7am to 6pm - Pager - [phone]  After 6pm go to [email or web address] - password [email or web address] [facility]  [facility]  Office  [phone]  CC: Primary care physician; Laverle Hobby, MD

## 2017-09-16 NOTE — Anesthesia Procedure Notes (Signed)
Spinal  Start time: 09/16/2017 1:15 PM End time: 09/16/2017 1:19 PM Staffing Anesthesiologist: Alvin Critchley Performed: anesthesiologist  Preanesthetic Checklist Completed: patient identified, site marked, surgical consent, pre-op evaluation, timeout performed, IV checked, risks and benefits discussed and monitors and equipment checked Spinal Block Patient position: sitting Prep: Betadine and site prepped and draped Patient monitoring: heart rate, cardiac monitor, continuous pulse ox and blood pressure Approach: right paramedian Location: L3-4 Injection technique: single-shot Needle Needle gauge: 25 G Assessment Sensory level: T8 Additional Notes Time out called.  Patient placed in sitting position.  Back prepped and draped in sterile fashion.  A skin wheal was made in the L3-L4 interspace with 1% Lidocaine plain.  A 25 G Quincke needle was used for the spinal with the return of clear, colorless CSF in all 4 quadrants.  15 mg of spinal marcaine + epi wash.  Patient tolerated the procedure well.

## 2017-09-16 NOTE — Anesthesia Post-op Follow-up Note (Signed)
Anesthesia QCDR form completed.        

## 2017-09-16 NOTE — Anesthesia Preprocedure Evaluation (Addendum)
Anesthesia Evaluation  Patient identified by MRN, date of birth, ID band Patient awake    Reviewed: Allergy & Precautions, NPO status , Patient's Chart, lab work & pertinent test results  Airway Mallampati: II      Comment: PT s/p tracheostomy. Now healing. Dental   Pulmonary shortness of breath, sleep apnea , pneumonia, COPD,  COPD inhaler and oxygen dependent, Current Smoker,    Pulmonary exam normal        Cardiovascular hypertension, Pt. on medications + CAD, + Peripheral Vascular Disease and +CHF  Normal cardiovascular exam+ dysrhythmias Atrial Fibrillation      Neuro/Psych PSYCHIATRIC DISORDERS Depression CVA    GI/Hepatic hiatal hernia, GERD  Medicated and Controlled,(+) Cirrhosis       , Hepatitis -, Unspecified  Endo/Other  diabetes, Type 2, Oral Hypoglycemic Agents  Renal/GU   negative genitourinary   Musculoskeletal  (+) Arthritis , Osteoarthritis,    Abdominal Normal abdominal exam  (+)   Peds negative pediatric ROS (+)  Hematology  (+) anemia ,   Anesthesia Other Findings Past Medical History: No date: A-fib ([place]) No date: Alcohol abuse No date: Alcohol use 06/19/2017: Aortic aneurysm ([facility])     Comment:  Ascending 4.4 cm, CT scan July 2018 No date: Aortic aneurysm, intrathoracic ([facility]) No date: Ascending aortic aneurysm ([facility]) No date: Ascending aortic aneurysm ([place]) 05/08/2016: Atrial fibrillation [facility]) April 2016: Broken rib     Comment:  History of broken ribs x 3 No date: Bronchitis No date: CAD (coronary artery disease) No date: CHF (congestive heart failure) (HCC) No date: Chronic alcoholism ([place]) No date: Cirrhosis ([place]) Nov. 2016: Closed nondisplaced fracture of fifth metatarsal bone of  left foot     Comment:  left No date: COPD (chronic obstructive pulmonary disease) (HCC) No date: Depression No date: Diabetes mellitus without complication (HCC) No date: Emphysema of lung (HCC) No  date: Esophageal rupture No date: GERD (gastroesophageal reflux disease) No date: Hepatitis C No date: Hip arthritis No date: History of diverticulitis     Comment:  ruptured and sepsis No date: History of hiatal hernia No date: History of smoking No date: HOH (hard of hearing) No date: Hyperlipidemia No date: Hypertension No date: Hypoxemia No date: Insomnia No date: Movement disorder No date: Onychogryphosis No date: Pneumonia No date: Rib fracture No date: Seasonal allergies No date: Shortness of breath dyspnea No date: Sleep apnea     Comment:  uses O2 @2  L/min at night No date: Stroke [facility]) No date: Thrombocytopenia ([place])  Reproductive/Obstetrics                             Anesthesia Physical  Anesthesia Plan  ASA: IV  Anesthesia Plan: Spinal   Post-op Pain Management:    Induction: Intravenous  PONV Risk Score and Plan:   Airway Management Planned: Nasal Cannula  Additional Equipment:   Intra-op Plan:   Post-operative Plan:   Informed Consent: I have reviewed the patients History and Physical, chart, labs and discussed the procedure including the risks, benefits and alternatives for the proposed anesthesia with the patient or authorized representative who has indicated his/her understanding and acceptance.   Dental advisory given  Plan Discussed with: CRNA and Surgeon  Anesthesia Plan Comments: (Talked with the daughter and the patient has had major issues with breathing in the past.  Although the PLT count is 105 K, down from 168 K, the benefits outweigh the risks and patient will  have a spinal.  Family and patient desire a regional block and not GOT if at all possible. They understand the risks and benefits.  INR is 0.97)       Anesthesia Quick Evaluation

## 2017-09-16 NOTE — Op Note (Signed)
09/16/2017  3:10 PM  PATIENT:  Marvin Lewis    PRE-OPERATIVE DIAGNOSIS:  RIGHT INTERTROCHANTERIC HIP FRACTURE  POST-OPERATIVE DIAGNOSIS:  Same  PROCEDURE:  INTRAMEDULLARY FIXATION  OF RIGHT  INTERTROCHANTRIC HIP FRACTURE  SURGEON:  Thornton Park, MD  ANESTHESIA:   General  IMPLANT:  Biomet Affixus long nail 11 x 400 mm, lag screw 115 mm and two distal interlocking screws 60 and 52 mm.  PREOPERATIVE INDICATIONS:  Marvin Lewis is a  66 y.o. male with a diagnosis of RIGHT INTERTROCHANTERIC HIP FRACTURE who failed conservative measures and elected for surgical management.    The risks, benefits and alternatives were discussed with the patient and his wife.  The risks include but are not limited to infection, bleeding, nerve or blood vessel injury, malunion, nonunion, hardware prominence, hardware failure, change in leg lengths or lower extremity rotation need for further surgery including hardware removal with conversion to a total hip arthroplasty. Medical risks include but are not limited to DVT and pulmonary embolism, myocardial infarction, stroke, pneumonia, respiratory failure and death. The patient and their family understood these risks and wished to proceed with surgery.  OPERATIVE PROCEDURE:  The patient was brought to the operating room and placed in the supine position on the fracture table. General anesthesia was administered.  A closed reduction was performed under C-arm guidance.  The fracture reduction was confirmed on both AP and lateral views. A time out was performed to verify the patient's name, date of birth, medical record number, correct site of surgery correct procedure to be performed. The timeout was also used to verify the patient received antibiotics and all appropriate instruments, implants and radiographic studies were available in the room. Once all in attendance were in agreement, the case began. The patient was prepped and draped in a sterile  fashion. He received preoperative antibiotics with kefzol 2 grams.  An incision was made proximal to the greater trochanter in line with the femur. A guidewire was placed over the tip of the greater trochanter and advanced into the proximal femur to the level of the lesser trochanter.  Confirmation of the drill pin position was made on AP and lateral C-arm images.  The threaded guidepin was then overdrilled with the proximal femoral drill.  A ball-tipped guidewire was then advanced across the fracture and down the femoral shaft to the knee. Its position at both the hip and the knee was confirmed on C-arm imaging. A depth gauge was used to measure the length of the long nail to be used. It was measured to be 400 mm. The nail was then inserted into the proximal femur, across the fracture site and down the femoral shaft. Its position was confirmed on AP and lateral C-arm images.   Once the nail was completely seated, the drill guide for the lag screw was placed through the guide arm for the Affixus nail. A guidepin was then placed through this drill guide and advanced through the lateral cortex of the femur, across the fracture site and into the femoral head achieving a tip apex distance of less than 25 mm. The length of the drill pin was measured to be 115 mm in length, and then the drill for the lag screw was advanced through the lateral cortex, across the fracture site and up into the femoral head..  The lag screw was then advanced by hand into position across the fracture site into the femoral head. Its final position was confirmed on AP and lateral C-arm images.  Compression was applied as traction was carefully released. The set screw in the top of the intramedullary rod was tightened by hand using a screwdriver. It was backed off a quarter turn to allow for compression at the fracture site.  The attention was then turned to placement of the distal interlocking screws. A perfect circle technique was used. 2  small stab incisions were made over the distal interlocking screw holes. A free hand technique was used to drill both distal interlocking screws. The depth of the screws was measured with a depth gauge. The appropriate length screw was then advanced into position and tightened by hand. Final C-arm images of the entire intramedullary construct were taken in both the AP and lateral planes.  The lengths of the distal screws was 60 and 52 mm.    The wounds were irrigated copiously and closed with 0 Vicryl for closure of the deep fascia and 2-0 Vicryl for subcutaneous closure. The skin was approximated with staples. A dry sterile dressing was applied. I was scrubbed and present the entire case and all sharp and instrument counts were correct at the conclusion of the case. Patient was transferred to hospital bed and brought to PACU in stable condition. I left a message for the patient's wife to let her know the case was performed without complication and the patient was stable in the recovery room.     Timoteo Gaul, MD

## 2017-09-16 NOTE — ED Notes (Signed)
Patient transported to CT 

## 2017-09-16 NOTE — Consult Note (Signed)
ORTHOPAEDIC CONSULTATION  REQUESTING PHYSICIAN: Henreitta Leber, MD  Chief Complaint: Right hip fracture status post fall  HPI: Marvin Lewis is a 66 y.o. male who complains of  right hip pain status post fall at home. He explains that he was watching TV late last night and got up to go to his bedroom and fell. He had immediate pain and was unable to weight-bear. He was brought to the [facility] emergency Department where x-rays were taken. X-rays demonstrated a displaced intertrochanteric hip fracture. Orthopedics is consulted for management of the hip fracture.  Past Medical History:  Diagnosis Date  . A-fib ([place])   . Alcohol abuse   . Alcohol use   . Aortic aneurysm ([place]) 06/19/2017   Ascending 4.4 cm, CT scan July 2018  . Aortic aneurysm, intrathoracic ([place])   . Ascending aortic aneurysm ([place])   . Ascending aortic aneurysm ([place])   . Atrial fibrillation ([place]) 05/08/2016  . Broken rib April 2016   History of broken ribs x 3  . Bronchitis   . CAD (coronary artery disease)   . CHF (congestive heart failure) ([place])   . Chronic alcoholism ([place])   . Cirrhosis ([place])   . Closed nondisplaced fracture of fifth metatarsal bone of left foot Nov. 2016   left  . COPD (chronic obstructive pulmonary disease) ([place])   . Depression   . Diabetes mellitus without complication ([place])   . Emphysema of lung ([place])   . Esophageal rupture   . GERD (gastroesophageal reflux disease)   . Hepatitis C   . Hip arthritis   . History of diverticulitis    ruptured and sepsis  . History of hiatal hernia   . History of smoking   . HOH (hard of hearing)   . Hyperlipidemia   . Hypertension   . Hypoxemia   . Insomnia   . Movement disorder   . Onychogryphosis   . Pneumonia   . Rib fracture   . Seasonal allergies   . Shortness of breath dyspnea   . Sleep apnea    uses O2 @2  L/min at night  . Stroke ([place])   . Thrombocytopenia ([place])    Past Surgical History:  Procedure Laterality Date  .  CATARACT EXTRACTION W/PHACO Right 01/16/2016   Procedure: CATARACT EXTRACTION PHACO AND INTRAOCULAR LENS PLACEMENT (IOC);  Surgeon: Birder Robson, MD;  Location: [facility] ORS;  Service: Ophthalmology;  Laterality: Right;  [place]: 00:45.0 AP%: 25.2 CDE: 11.36  Lot #[id number]  . CHOLECYSTECTOMY    . COLON RESECTION    . COLONOSCOPY WITH PROPOFOL N/A 08/27/2016   Procedure: COLONOSCOPY WITH PROPOFOL;  Surgeon: Lollie Sails, MD;  Location: [facility] ENDOSCOPY;  Service: Endoscopy;  Laterality: N/A;  . ELECTROPHYSIOLOGIC STUDY N/A 05/22/2016   Procedure: CARDIOVERSION;  Surgeon: Teodoro Spray, MD;  Location: [facility] ORS;  Service: Cardiovascular;  Laterality: N/A;  . HERNIA REPAIR    . PEG PLACEMENT N/A   . PEG TUBE REMOVAL N/A   . PILONIDAL CYST EXCISION N/A   . TRACHEOSTOMY N/A    Social History   Social History  . Marital status: Married    Spouse name: N/A  . Number of children: N/A  . Years of education: N/A   Occupational History  . retired    Social History Main Topics  . Smoking status: Current Every Day Smoker    Packs/day: 0.25    Years: 40.00    Types: Cigarettes  . Smokeless tobacco: Never Used  . Alcohol  use Yes     Comment: Per wife, pt is alcoholic  . Drug use: No  . Sexual activity: Yes   Other Topics Concern  . None   Social History Narrative  . None   Family History  Problem Relation Age of Onset  . Coronary artery disease Mother   . Hypertension Mother   . Heart disease Mother   . Lung disease Mother   . Coronary artery disease Father   . Hypertension Father   . Diabetes Father   . Heart disease Father   . Cancer Father        prostate  . Alcohol abuse Brother    No Known Allergies Prior to Admission medications   Medication Sig Start Date End Date Taking? Authorizing Provider  amLODipine (NORVASC) 5 MG tablet Take 5 mg by mouth daily. 06/13/17  Yes [provider]  atorvastatin (LIPITOR) 10 MG tablet Take 1 tablet (10 mg total) by mouth at  bedtime. 11/09/16  Yes Lada, Satira Anis, MD  B-D INS SYRINGE 0.5CC/30GX1/2" 30G X 1/2" 0.5 ML MISC AS DIRECTED. 07/17/15  Yes Crissman, Jeannette How, MD  benazepril (LOTENSIN) 10 MG tablet Take 10 mg by mouth daily.    Yes [provider]  docusate sodium (COLACE) 100 MG capsule Take 1 capsule (100 mg total) by mouth 2 (two) times daily. Patient taking differently: Take 100 mg by mouth daily.  09/05/17  Yes Gouru, Illene Silver, MD  ferrous sulfate 325 (65 FE) MG tablet Take 1 tablet (325 mg total) by mouth 2 (two) times daily with a meal. 09/05/17 09/05/18 Yes Gouru, Aruna, MD  fluticasone-salmeterol (ADVAIR HFA) 115-21 MCG/ACT inhaler Inhale 2 puffs into the lungs every 12 (twelve) hours.   Yes [provider]  folic acid (FOLVITE) 1 MG tablet Take 1 tablet (1 mg total) by mouth daily. 09/06/17  Yes Gouru, Illene Silver, MD  furosemide (LASIX) 20 MG tablet Take 2 tablets (40 mg total) by mouth daily. 09/05/17  Yes Gouru, Aruna, MD  insulin degludec (TRESIBA) 100 UNIT/ML SOPN FlexTouch Pen Inject 48 Units into the skin at bedtime.   Yes [provider]  ipratropium-albuterol (DUONEB) 0.5-2.5 (3) MG/3ML SOLN Take 3 mLs by nebulization every 6 (six) hours as needed. For shortness of breath/wheezing   Yes [provider]  nicotine (NICODERM CQ - DOSED IN MG/24 HOURS) 21 mg/24hr patch Place 1 patch (21 mg total) onto the skin daily. 09/06/17  Yes Gouru, Illene Silver, MD  omeprazole (PRILOSEC) 20 MG capsule Take 20 mg by mouth daily.   Yes [provider]  sotalol (BETAPACE) 80 MG tablet Take 1 tablet (80 mg total) by mouth 2 (two) times daily. 05/22/16  Yes Teodoro Spray, MD  zolpidem (AMBIEN) 10 MG tablet Take 10 mg by mouth at bedtime as needed for sleep.    Yes [provider]  albuterol (PROVENTIL HFA;VENTOLIN HFA) 108 (90 Base) MCG/ACT inhaler Inhale 2 puffs into the lungs every 6 (six) hours as needed for wheezing or shortness of breath. Patient not taking: Reported on 09/16/2017  09/05/17   Nicholes Mango, MD  amoxicillin-clavulanate (AUGMENTIN) 500-125 MG tablet Take 1 tablet (500 mg total) by mouth 3 (three) times daily. Patient not taking: Reported on 09/16/2017 09/05/17   Nicholes Mango, MD  guaiFENesin-dextromethorphan (ROBITUSSIN DM) 100-10 MG/5ML syrup Take 10 mLs by mouth every 6 (six) hours as needed for cough. Patient not taking: Reported on 09/16/2017 09/05/17   Nicholes Mango, MD   Ct Head Wo Contrast  Result Date: 09/16/2017 CLINICAL DATA:  Hit head, fall. EXAM: CT HEAD WITHOUT CONTRAST TECHNIQUE: Contiguous axial images were obtained from the base of the skull through the vertex without intravenous contrast. COMPARISON:  MRI 05/05/2015.  CT 07/14/2012. 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 Mild cerebral atrophy. No acute intracranial abnormality. Specifically, no hemorrhage, hydrocephalus, mass lesion, acute infarction, or significant intracranial injury. Vascular: No hyperdense vessel or unexpected calcification. Skull: No acute calvarial abnormality. Sinuses/Orbits: Visualized paranasal sinuses and mastoids clear. Orbital soft tissues unremarkable. Other: None IMPRESSION: No acute intracranial abnormality. Electronically Signed   By: Rolm Baptise M.D.   On: 09/16/2017 07:47   Dg Hip Unilat  With Pelvis 2-3 Views Right  Result Date: 09/16/2017 CLINICAL DATA:  Injury.  Fall. EXAM: DG HIP (WITH OR WITHOUT PELVIS) 2-3V RIGHT COMPARISON:  No recent. FINDINGS: Right intertrochanteric fracture noted of the right femur. Displaced fracture of the lesser trochanter noted. Plate and screw fixation left hip. Degenerative changes both hips . Aortoiliac and peripheral vascular calcification. Surgical clips in the pelvis. IMPRESSION: 1. Right intertrochanteric proximal femoral fracture. Displaced fracture of the lesser trochanter noted. 2. Wire plate and screw fixation left hip. 3. Aorto iliac and peripheral vascular disease . Electronically Signed   By: Marcello Moores   Register   On: 09/16/2017 08:13    Positive ROS: All other systems have been reviewed and were otherwise negative with the exception of those mentioned in the HPI and as above.  Physical Exam: General: Alert, no acute distress   MUSCULOSKELETAL: Right lower extremity: Patient's skin is intact. He has no erythema or ecchymosis or significant swelling. His thigh and leg compartments are soft and compressible. He has palpable pedal pulses, intact sensation light touch intact motor function distally in both lower extremity. He has shortening and external rotation of the right lower extremity.  Assessment: Displaced right intertrochanteric hip fracture  Plan: I spoke with the patient and his wife who were in the preoperative area. I recommended intramedullary fixation for this fracture. The patient had requested me to perform the operation as I have previously performed an ORIF of his left hip. We discussed the surgery in detail as well as the postoperative course.  We also discussed the risks and benefits of surgery. They understand the risks include but are not limited to infection, bleeding requiring blood transfusion, nerve or blood vessel injury, persistent hip pain, leg length discrepancy, change in lower extremity rotation, hardware failure and the need for further surgery. They also understand the medical risks include DVT and pulmonary embolism, myocardial infarction, stroke, pneumonia, respiratory failure and death. They understood these risks and wished to proceed. They understood that these risks with the same risks as were present at his ORIF of the left hip that I performed previously. I answered all her questions. Patient is been cleared of the hospitalist service for surgery. I reviewed all laboratory and radiographic studies in preparation for this case.    Thornton Park, MD    09/16/2017 12:37 PM

## 2017-09-16 NOTE — ED Notes (Addendum)
Pt difficult stick, has been stuck twice per previous shift RN and three times via EMS prior to arrival.  Lab notified to collect blood specimens.

## 2017-09-16 NOTE — ED Notes (Signed)
Same Day Surgery RN has requested that pt remain in ED to be picked up by orderly soon.  Report given.  Pt and family updated on plan of care.

## 2017-09-16 NOTE — Transfer of Care (Signed)
Immediate Anesthesia Transfer of Care Note  Patient: Marvin Lewis  Procedure(s) Performed: INTRAMEDULLARY (IM) NAIL INTERTROCHANTRIC (Right Hip)  Patient Location: PACU  Anesthesia Type:Spinal  Level of Consciousness: awake, alert  and oriented  Airway & Oxygen Therapy: Patient Spontanous Breathing and Patient connected to face mask oxygen  Post-op Assessment: Report given to RN and Post -op Vital signs reviewed and stable  Post vital signs: Reviewed and stable  Last Vitals:  Vitals:   09/16/17 0945 09/16/17 1045  BP: (!) 145/74 (!) 144/78  Pulse: 77 80  Resp: 18 (!) 21  Temp:    SpO2: 97% 99%    Last Pain:  Vitals:   09/16/17 1116  TempSrc:   PainSc: 8       Patients Stated Pain Goal: 2 (68/25/74 9355)  Complications: No apparent anesthesia complications

## 2017-09-17 ENCOUNTER — Encounter: Payer: Self-pay | Admitting: Orthopedic Surgery

## 2017-09-17 LAB — CBC
HEMATOCRIT: 27.7 % — AB (ref 40.0–52.0)
HEMOGLOBIN: 8.2 g/dL — AB (ref 13.0–18.0)
MCH: 22.2 pg — AB (ref 26.0–34.0)
MCHC: 29.8 g/dL — AB (ref 32.0–36.0)
MCV: 74.6 fL — ABNORMAL LOW (ref 80.0–100.0)
Platelets: 102 10*3/uL — ABNORMAL LOW (ref 150–440)
RBC: 3.71 MIL/uL — ABNORMAL LOW (ref 4.40–5.90)
RDW: 22.5 % — AB (ref 11.5–14.5)
WBC: 10.9 10*3/uL — ABNORMAL HIGH (ref 3.8–10.6)

## 2017-09-17 LAB — BASIC METABOLIC PANEL WITH GFR
Anion gap: 9 (ref 5–15)
BUN: 32 mg/dL — ABNORMAL HIGH (ref 6–20)
CO2: 28 mmol/L (ref 22–32)
Calcium: 8.2 mg/dL — ABNORMAL LOW (ref 8.9–10.3)
Chloride: 98 mmol/L — ABNORMAL LOW (ref 101–111)
Creatinine, Ser: 1.16 mg/dL (ref 0.61–1.24)
GFR calc Af Amer: >60 mL/min
GFR calc non Af Amer: >60 mL/min
Glucose, Bld: 282 mg/dL — ABNORMAL HIGH (ref 65–99)
Potassium: 4.9 mmol/L (ref 3.5–5.1)
Sodium: 135 mmol/L (ref 135–145)

## 2017-09-17 LAB — GLUCOSE, CAPILLARY
GLUCOSE-CAPILLARY: 252 mg/dL — AB (ref 65–99)
GLUCOSE-CAPILLARY: 213 mg/dL — AB (ref 65–99)
Glucose-Capillary: 219 mg/dL — ABNORMAL HIGH (ref 65–99)
Glucose-Capillary: 158 mg/dL — ABNORMAL HIGH (ref 65–99)

## 2017-09-17 MED ORDER — OXYCODONE HCL 5 MG PO TABS
15.0000 mg | ORAL_TABLET | ORAL | Status: DC | PRN
  Administered 2017-09-17 – 2017-09-19 (×12): 15 mg via ORAL
  Filled 2017-09-17 (×12): qty 3

## 2017-09-17 MED ORDER — HYDROMORPHONE HCL 1 MG/ML IJ SOLN
1.0000 mg | INTRAMUSCULAR | Status: DC | PRN
  Administered 2017-09-17 – 2017-09-18 (×2): 1 mg via INTRAVENOUS
  Filled 2017-09-17 (×2): qty 1

## 2017-09-17 MED ORDER — INSULIN DEGLUDEC 100 UNIT/ML ~~LOC~~ SOPN: 48.0000 [IU] | PEN_INJECTOR | Freq: Every day | SUBCUTANEOUS | Status: DC

## 2017-09-17 MED ORDER — INSULIN GLARGINE 100 UNIT/ML ~~LOC~~ SOLN
48.0000 [IU] | Freq: Every day | SUBCUTANEOUS | Status: DC
  Administered 2017-09-17 – 2017-09-19 (×2): 48 [IU] via SUBCUTANEOUS
  Filled 2017-09-17 (×3): qty 0.48

## 2017-09-17 NOTE — Care Management Note (Signed)
Case Management Note  Patient Details  Name: ARON INGE MRN: [id number] Date of Birth: May 23, 1951  Subjective/Objective:  Patient admitted following a fall in the home. He is normally independent with adls and is able to drive if needed. PCP is Dr. Ashby Dawes per medical record.  Recently admitted 09/26 with acute respiratory failure. OF:HQRF on chronic O2 at 3 L through [facility]. Lives with wife. Patient with  INTRAMEDULLARY (IM) NAIL INTERTROCHANTRIC (Right Hip) on 10/9.  Patient is a bundle patient.                 Action/Plan: PT pending  Expected Discharge Date:                  Expected Discharge Plan:     In-House Referral:     Discharge planning Services  CM Consult  Post Acute Care Choice:    Choice offered to:     DME Arranged:    DME Agency:     HH Arranged:    [place] Agency:     Status of Service:  In process, will continue to follow  If discussed at Long Length of Stay Meetings, dates discussed:    Additional Comments:  Jolly Mango, RN 09/17/2017, 8:48 AM

## 2017-09-17 NOTE — Progress Notes (Signed)
Inpatient Diabetes Program Recommendations  AACE/ADA: New Consensus Statement on Inpatient Glycemic Control (2015)  Target Ranges:  Prepandial:   less than 140 mg/dL      Peak postprandial:   less than 180 mg/dL (1-2 hours)      Critically ill patients:  140 - 180 mg/dL   Lab Results  Component Value Date   GLUCAP 252 (H) 09/17/2017   HGBA1C 9.9 (H) 04/23/2016    Review of Glycemic Control  Diabetes history: DM 2 Outpatient Diabetes medications: Tresiba 48 units QHS Current orders for Inpatient glycemic control: Lantus 48 units QHS, Novolog Sensitive 0-9 units tid + hs  Inpatient Diabetes Program Recommendations:    Patient received 125 IV Solumedrol yesterday at 1340. Glucose elevated today. Lantus ordered at home dose, however will be given tonight. Agree with addition of basal insulin. Glucose should trend lower, no more steroids ordered for patient.  Thanks,  Tama Headings RN, MSN, [facility] Inpatient Diabetes Coordinator Team Pager [phone] (8a-5p)

## 2017-09-17 NOTE — NC FL2 (Signed)
[place] LEVEL OF CARE SCREENING TOOL     IDENTIFICATION  Patient Name: Marvin Lewis Birthdate: 1951/01/14 Sex: male Admission Date (Current Location): 09/16/2017  [place] and [place] Number:  Engineering geologist and Address:  [facility], [address] [place],  [address]      Provider Number: [id number]  Attending Physician Name and Address:  Henreitta Leber, MD  Relative Name and Phone Number:       Current Level of Care: Hospital Recommended Level of Care: [place] Prior Approval Number:    Date Approved/Denied:   PASRR Number:    Discharge Plan: SNF    Current Diagnoses: Patient Active Problem List   Diagnosis Date Noted  . Closed right hip fracture ([place]) 09/16/2017  . Acute respiratory distress   . Acute on chronic congestive heart failure ([place])   . Palliative care by specialist   . Acute respiratory failure ([place]) 09/03/2017  . Aortic aneurysm ([place]) 06/19/2017  . Hemoptysis 06/17/2017  . A-fib ([place]) 05/21/2016  . Leg cramps 05/08/2016  . Anemia 05/08/2016  . Atrial fibrillation ([place]) 05/08/2016  . Abnormal weight gain 04/22/2016  . Goals of care, counseling/discussion 04/22/2016  . Elevated brain natriuretic peptide (BNP) level 04/22/2016  . Persistent atrial fibrillation ([place]) 03/29/2016  . Breathlessness on exertion 03/27/2016  . Gout 02/23/2016  . Hyperkalemia 01/12/2016  . Onychomycosis 01/10/2016  . Hoarseness of voice 01/10/2016  . Tobacco abuse 01/10/2016  . Hx of completed stroke 01/10/2016  . Athletes foot 08/09/2015  . Chronic alcoholism ([place])   . Movement disorder   . COPD (chronic obstructive pulmonary disease) ([place])   . Emphysema of lung ([place])   . Type II diabetes mellitus, uncontrolled ([place])   . Hyperlipidemia   . Hypertension   . CAD (coronary artery disease)   . Hepatitis C   . Cirrhosis ([place])   . Alcohol abuse   . Onychogryphosis   . Insomnia   . Hip  arthritis   . Hypoxemia   . 1st degree AV block   . COPD exacerbation ([place]) 05/26/2015  . Controlled type 2 diabetes mellitus without complication ([place]) 62/56/3893    Orientation RESPIRATION BLADDER Height & Weight     Self, Time, Situation, Place  Normal Continent Weight: 238 lb (108 kg) Height:  5\' 11"  (180.3 cm)  BEHAVIORAL SYMPTOMS/MOOD NEUROLOGICAL BOWEL NUTRITION STATUS      Continent Diet (Diet: Heart Healthy/ Carb Modified. )  AMBULATORY STATUS COMMUNICATION OF NEEDS Skin   Extensive Assist Verbally Surgical wounds (Incision: Right Hip. )                       Personal Care Assistance Level of Assistance  Bathing, Feeding, Dressing Bathing Assistance: Limited assistance Feeding assistance: Independent Dressing Assistance: Limited assistance     Functional Limitations Info  Sight, Hearing, Speech Sight Info: Adequate Hearing Info: Adequate Speech Info: Adequate    SPECIAL CARE FACTORS FREQUENCY  PT (By licensed PT), OT (By licensed OT)     PT Frequency:  (5) OT Frequency:  (5)            Contractures      Additional Factors Info  Code Status, Allergies Code Status Info:  (Full Code. ) Allergies Info:  (No Known Allergies. )           Current Medications (09/17/2017):  This is the current hospital active medication list Current Facility-Administered Medications  Medication Dose  Route Frequency Provider Last Rate Last Dose  . 0.9 %  sodium chloride infusion  75 mL/hr Intravenous Continuous Thornton Park, MD 75 mL/hr at 09/16/17 1750 75 mL/hr at 09/16/17 1750  . acetaminophen (TYLENOL) tablet 650 mg  650 mg Oral Q6H PRN Thornton Park, MD       Or  . acetaminophen (TYLENOL) suppository 650 mg  650 mg Rectal Q6H PRN Thornton Park, MD      . alum & mag hydroxide-simeth (MAALOX/MYLANTA) 200-200-20 MG/5ML suspension 30 mL  30 mL Oral Q4H PRN Thornton Park, MD      . amLODipine (NORVASC) tablet 5 mg  5 mg Oral Daily Henreitta Leber, MD   5  mg at 09/17/17 0841  . atorvastatin (LIPITOR) tablet 10 mg  10 mg Oral QHS Henreitta Leber, MD   10 mg at 09/16/17 2037  . benazepril (LOTENSIN) tablet 10 mg  10 mg Oral Daily Henreitta Leber, MD   10 mg at 09/17/17 2919  . bisacodyl (DULCOLAX) suppository 10 mg  10 mg Rectal Daily PRN Thornton Park, MD      . docusate sodium (COLACE) capsule 100 mg  100 mg Oral BID Thornton Park, MD   100 mg at 09/17/17 0839  . enoxaparin (LOVENOX) injection 40 mg  40 mg Subcutaneous Q24H Thornton Park, MD   40 mg at 09/17/17 1660  . fentaNYL (SUBLIMAZE) injection 25 mcg  25 mcg Intravenous Q5 min PRN Alvin Critchley, MD      . ferrous sulfate tablet 325 mg  325 mg Oral BID WC Henreitta Leber, MD   325 mg at 09/17/17 0826  . folic acid (FOLVITE) tablet 1 mg  1 mg Oral Daily Henreitta Leber, MD   1 mg at 09/17/17 0839  . furosemide (LASIX) tablet 40 mg  40 mg Oral Daily Henreitta Leber, MD   40 mg at 09/17/17 0840  . HYDROmorphone (DILAUDID) injection 1 mg  1 mg Intravenous Q3H PRN Thornton Park, MD   1 mg at 09/17/17 0343  . insulin aspart (novoLOG) injection 0-9 Units  0-9 Units Subcutaneous TID AC & HS Lance Coon, MD   5 Units at 09/17/17 [phone]  . ipratropium-albuterol (DUONEB) 0.5-2.5 (3) MG/3ML nebulizer solution 3 mL  3 mL Nebulization Q6H PRN Henreitta Leber, MD      . LORazepam (ATIVAN) tablet 1 mg  1 mg Oral Q6H PRN Demetrios Loll, MD       Or  . LORazepam (ATIVAN) injection 1 mg  1 mg Intravenous Q6H PRN Demetrios Loll, MD      . magnesium citrate solution 1 Bottle  1 Bottle Oral Once PRN Thornton Park, MD      . methocarbamol (ROBAXIN) tablet 500 mg  500 mg Oral Q8H PRN Thornton Park, MD   500 mg at 09/17/17 5997   Or  . methocarbamol (ROBAXIN) 500 mg in dextrose 5 % 50 mL IVPB  500 mg Intravenous Q6H PRN Thornton Park, MD      . mometasone-formoterol (DULERA) 200-5 MCG/ACT inhaler 2 puff  2 puff Inhalation BID Henreitta Leber, MD   2 puff at 09/17/17 7414  . multivitamin with  minerals tablet 1 tablet  1 tablet Oral Daily Demetrios Loll, MD   1 tablet at 09/17/17 [phone]  . nicotine (NICODERM CQ - dosed in mg/24 hours) patch 21 mg  21 mg Transdermal Daily Henreitta Leber, MD   21 mg at 09/17/17 0837  . ondansetron (ZOFRAN) injection  4 mg  4 mg Intravenous Once PRN Alvin Critchley, MD      . ondansetron [facility]) tablet 4 mg  4 mg Oral Q6H PRN Thornton Park, MD       Or  . ondansetron [facility]) injection 4 mg  4 mg Intravenous Q6H PRN Thornton Park, MD      . oxyCODONE (Oxy IR/ROXICODONE) immediate release tablet 5-10 mg  5-10 mg Oral Q3H PRN Thornton Park, MD   10 mg at 09/17/17 0826  . pantoprazole (PROTONIX) EC tablet 40 mg  40 mg Oral Daily Henreitta Leber, MD   40 mg at 09/17/17 0841  . polyethylene glycol (MIRALAX / GLYCOLAX) packet 17 g  17 g Oral Daily PRN Thornton Park, MD      . senna [facility]) tablet 8.6 mg  1 tablet Oral BID Thornton Park, MD   8.6 mg at 09/17/17 0839  . sotalol (BETAPACE) tablet 80 mg  80 mg Oral BID Henreitta Leber, MD   80 mg at 09/17/17 0258  . thiamine (VITAMIN B-1) tablet 100 mg  100 mg Oral Daily Demetrios Loll, MD   100 mg at 09/17/17 5277   Or  . thiamine (B-1) injection 100 mg  100 mg Intravenous Daily Demetrios Loll, MD      . zolpidem [facility]) tablet 5 mg  5 mg Oral QHS PRN Henreitta Leber, MD   5 mg at 09/16/17 2037     Discharge Medications: Please see discharge summary for a list of discharge medications.  Relevant Imaging Results:  Relevant Lab Results:   Additional Information  (SSN: [id number])  Naveyah Iacovelli, Veronia Beets, LCSW

## 2017-09-17 NOTE — Progress Notes (Signed)
[facility] NAME: Marvin Lewis    MR#:  [id number]  DATE OF BIRTH:  Feb 11, 1951  SUBJECTIVE:   Pt. Here due to fall and noted to have a right Hip fracture. S/p Right hip intramedullary fixation postop day #1. Patient having significant pain in the right hip today.  REVIEW OF SYSTEMS:    Review of Systems  Constitutional: Negative for chills and fever.  HENT: Negative for congestion and tinnitus.   Eyes: Negative for blurred vision and double vision.  Respiratory: Negative for cough, shortness of breath and wheezing.   Cardiovascular: Negative for chest pain, orthopnea and PND.  Gastrointestinal: Negative for abdominal pain, diarrhea, nausea and vomiting.  Genitourinary: Negative for dysuria and hematuria.  Musculoskeletal: Positive for falls and joint pain (Right Hip).  Neurological: Negative for dizziness, sensory change and focal weakness.  All other systems reviewed and are negative.   Nutrition: Heart healthy/Carb control Tolerating Diet: Yes Tolerating PT: Eval noted.  DRUG ALLERGIES:  No Known Allergies  VITALS:  Blood pressure 138/68, pulse 70, temperature 97.8 F (36.6 C), temperature source Oral, resp. rate 20, height 5\' 11"  (1.803 m), weight 108 kg (238 lb), SpO2 95 %.  PHYSICAL EXAMINATION:   Physical Exam  GENERAL:  66 y.o.-year-old patient lying in bed in no acute distress.  EYES: Pupils equal, round, reactive to light and accommodation. No scleral icterus. Extraocular muscles intact.  HEENT: Head atraumatic, normocephalic. Oropharynx and nasopharynx clear.  NECK:  Supple, no jugular venous distention. No thyroid enlargement, no tenderness.  LUNGS: Normal breath sounds bilaterally, no wheezing, rales, rhonchi. No use of accessory muscles of respiration.  CARDIOVASCULAR: S1, S2 normal. No murmurs, rubs, or gallops.  ABDOMEN: Soft, nontender, nondistended. Bowel sounds present. No organomegaly or mass.   EXTREMITIES: No cyanosis, clubbing or edema b/l.   Right Hip dressing in place with no acute drainage.  NEUROLOGIC: Cranial nerves II through XII are intact. No focal Motor or sensory deficits b/l.  Globally weak.  PSYCHIATRIC: The patient is alert and oriented x 3.  SKIN: No obvious rash, lesion, or ulcer.    LABORATORY PANEL:   CBC  Recent Labs Lab 09/17/17 0410  WBC 10.9*  HGB 8.2*  HCT 27.7*  PLT 102*   ------------------------------------------------------------------------------------------------------------------  Chemistries   Recent Labs Lab 09/16/17 0836 09/17/17 0410  NA 140 135  K 3.9 4.9  CL 102 98*  CO2 27 28  GLUCOSE 200* 282*  BUN 25* 32*  CREATININE 0.96 1.16  CALCIUM 8.7* 8.2*  AST 97*  --   ALT 106*  --   ALKPHOS 87  --   BILITOT 0.9  --    ------------------------------------------------------------------------------------------------------------------  Cardiac Enzymes No results for input(s): TROPONINI in the last 168 hours. ------------------------------------------------------------------------------------------------------------------  RADIOLOGY:  Ct Head Wo Contrast  Result Date: 09/16/2017 CLINICAL DATA:  Hit head, fall. EXAM: CT HEAD WITHOUT CONTRAST TECHNIQUE: Contiguous axial images were obtained from the base of the skull through the vertex without intravenous contrast. COMPARISON:  MRI 05/05/2015.  CT 07/14/2012. 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 Mild cerebral atrophy. No acute intracranial abnormality. Specifically, no hemorrhage, hydrocephalus, mass lesion, acute infarction, or significant intracranial injury. Vascular: No hyperdense vessel or unexpected calcification. Skull: No acute calvarial abnormality. Sinuses/Orbits: Visualized paranasal sinuses and mastoids clear. Orbital soft tissues unremarkable. Other: None IMPRESSION: No acute intracranial abnormality. Electronically Signed   By: Rolm Baptise  M.D.   On: 09/16/2017 07:47   Dg  Hip Operative Unilat W Or W/o Pelvis Right  Result Date: 09/16/2017 CLINICAL DATA:  Right hip surgery. EXAM: OPERATIVE RIGHT HIP (WITH PELVIS IF PERFORMED) 23 VIEWS TECHNIQUE: Fluoroscopic spot image(s) were submitted for interpretation post-operatively. COMPARISON:  09/16/2017 FINDINGS: Patient status post ORIF right intertrochanteric hip fracture. Hardware intact. Anatomic alignment. Twenty-three images obtained. 1 minutes 22 seconds fluoroscopy time observed. IMPRESSION: ORIF right intertrochanteric hip fracture. Electronically Signed   By: Marcello Moores  Register   On: 09/16/2017 14:44   Dg Hip Unilat  With Pelvis 2-3 Views Right  Result Date: 09/16/2017 CLINICAL DATA:  Injury.  Fall. EXAM: DG HIP (WITH OR WITHOUT PELVIS) 2-3V RIGHT COMPARISON:  No recent. FINDINGS: Right intertrochanteric fracture noted of the right femur. Displaced fracture of the lesser trochanter noted. Plate and screw fixation left hip. Degenerative changes both hips . Aortoiliac and peripheral vascular calcification. Surgical clips in the pelvis. IMPRESSION: 1. Right intertrochanteric proximal femoral fracture. Displaced fracture of the lesser trochanter noted. 2. Wire plate and screw fixation left hip. 3. Aorto iliac and peripheral vascular disease . Electronically Signed   By: Marcello Moores  Register   On: 09/16/2017 08:13   Dg Femur, Min 2 Views Right  Result Date: 09/16/2017 CLINICAL DATA:  Status post intramedullary nailing of the right femur for an intertrochanteric fracture. EXAM: RIGHT FEMUR 2 VIEWS COMPARISON:  Intraoperative fluoro spot radiographs of today's date FINDINGS: The patient has undergone ORIF for an intertrochanteric fracture. An intramedullary rod with telescoping screw is present. The securing screws in the lower femur are in reasonable position. The lesser trochanter remains displaced. IMPRESSION: No postprocedure complication following ORIF for an intertrochanteric fracture of the  right hip. Electronically Signed   By: David  Martinique M.D.   On: 09/16/2017 16:02     ASSESSMENT AND PLAN:   66 year old male with past medical history of hypertension, hyperlipidemia, chronic atrial fibrillation, alcohol abuse, COPD, CHF who presents to the hospital after a mechanical fall and noted to have a right hip fracture.  1. Status post fall and right hip fracture-seen by orthopedics and status post right hip intramedullary fixation postop day 1 today. -Continue pain control status orthopedics, physical therapy as tolerated. Foley has been discontinued, Lovenox for DVT prophylaxis. -Likely discharged to short-term rehabilitation next 1-2 days.  2. Diabetes type 2 without complication-patient's blood sugars are somewhat elevated, will resume his long-acting insulin, continue sliding scale insulin. Follow blood sugars.  3. Essential hypertension-continue Norvasc, benazepril.  4. History of chronic atrial fibrillation-rate controlled. Continue sotalol. Patient is not on long-term coagulation due to history of alcohol abuse and falls.  5. COPD-no acute exacerbation-continue Dulera, DuoNeb nebs as needed.  6. GERD-continue Protonix.  7. Anemia of chronic disease-secondary to alcohol abuse. Hemoglobin currently stable and will cont. To monitor.   8. History of chronic systolic CHF-clinically patient is not in congestive heart failure. Continue sotalol, benazepril, Lasix.  9. Tobacco abuse-continue nicotine patch.  10. ETOh abuse - high risk for ETOH withdrawal. Cont. CIWA.    All the records are reviewed and case discussed with Care Management/Social Worker. Management plans discussed with the patient, family and they are in agreement.  CODE STATUS: Full code  DVT Prophylaxis: Lovenox  TOTAL TIME TAKING CARE OF THIS PATIENT: 30 minutes.   POSSIBLE D/C IN 2-3 DAYS, DEPENDING ON CLINICAL CONDITION.   Henreitta Leber M.D on 09/17/2017 at 2:24 PM  Between 7am to  6pm - Pager - [phone]  After 6pm go to [email or web address] - [facility]  Office  [phone]  CC: Primary care physician; Laverle Hobby, MD

## 2017-09-17 NOTE — Clinical Social Work Placement (Signed)
   CLINICAL SOCIAL WORK PLACEMENT  NOTE  Date:  09/17/2017  Patient Details  Name: Marvin Lewis MRN: [id number] Date of Birth: 11-26-1951  Clinical Social Work is seeking post-discharge placement for this patient at the [place] level of care (*CSW will initial, date and re-position this form in  chart as items are completed):  Yes   Patient/family provided with [facility] Work Department's list of facilities offering this level of care within the geographic area requested by the patient (or if unable, by the patient's family).  Yes   Patient/family informed of their freedom to choose among providers that offer the needed level of care, that participate in Medicare, Medicaid or managed care program needed by the patient, have an available bed and are willing to accept the patient.  Yes   Patient/family informed of [facility]'s ownership interest in [facility] and [facility], as well as of the fact that they are under no obligation to receive care at these facilities.  PASRR submitted to EDS on 09/17/17     PASRR number received on       Existing PASRR number confirmed on       FL2 transmitted to all facilities in geographic area requested by pt/family on 09/17/17     FL2 transmitted to all facilities within larger geographic area on       Patient informed that his/her managed care company has contracts with or will negotiate with certain facilities, including the following:        Yes   Patient/family informed of bed offers received.  Patient chooses bed at  ([facility] )     Physician recommends and patient chooses bed at      Patient to be transferred to   on  .  Patient to be transferred to facility by       Patient family notified on   of transfer.  Name of family member notified:        PHYSICIAN       Additional Comment:    _______________________________________________ Meher Kucinski, Veronia Beets, LCSW 09/17/2017, 2:24  PM

## 2017-09-17 NOTE — Clinical Social Work Note (Signed)
Clinical Social Work Assessment  Patient Details  Name: Marvin Lewis MRN: [id number] Date of Birth: 01/02/1951  Date of referral:  09/17/17               Reason for consult:  Facility Placement                Permission sought to share information with:  Facility Contact Representative Permission granted to share information::  Yes, Verbal Permission Granted  Name::      Skilled Nursing Facility   Agency::   [facility]   Relationship::     Contact Information:     Housing/Transportation Living arrangements for the past 2 months:  Single Family Home Source of Information:  Patient, Spouse Patient Interpreter Needed:  None Criminal Activity/Legal Involvement Pertinent to Current Situation/Hospitalization:  No - Comment as needed Significant Relationships:  Spouse Lives with:  Spouse Do you feel safe going back to the place where you live?  Yes Need for family participation in patient care:  Yes (Comment)  Care giving concerns:  Patient lives in [place] with his wife Marvin Lewis.    Social Worker assessment / plan:  Clinical Social Worker (CSW) received SNF consult. PT is recommending SNF. CSW met with patient alone at bedside to discuss D/C plan. Patient was alert and oriented X4 and was sitting up in the chair at bedside on oxygen. CSW introduced self and explained role of CSW department. Per patient he is on oxygen at night only through [employer]. Patient reported that he lives in [place] with his wife Marvin Lewis. CSW explained SNF process and that medicare requires a 3 night inpatient qualifying stay in a hospital in order to pay for SNF. Patient was admitted to inpatient on 09/16/17. Patient verbalized his understanding and [place]. FL2 complete and faxed out. PASARR is pending.   CSW met with patient for a second time and his wife Marvin Lewis was in the room. CSW presented bed offers and they chose [facility]. Plan is for patient to D/C to [facility] Friday  09/19/17 pending medical clearance. Patient is in the medicare bundle program. Medicare bundle case manager aware of above. [facility] liaison is aware of above. CSW will continue to follow and assist as needed.   Employment status:  Disabled (Comment on whether or not currently receiving Disability), Retired Insurance information:  Medicare PT Recommendations:  Skilled Nursing Facility Information / Referral to community resources:  Skilled Nursing Facility  Patient/Family's Response to care:  Patient and his wife are agreeable for patient to go to [facility].   Patient/Family's Understanding of and Emotional Response to Diagnosis, Current Treatment, and Prognosis:  Patient and his wife were very pleasant and thanked CSW for assistance.   Emotional Assessment Appearance:  Appears stated age Attitude/Demeanor/Rapport:    Affect (typically observed):  Accepting, Adaptable, Pleasant Orientation:  Oriented to Self, Oriented to Place, Oriented to  Time, Oriented to Situation Alcohol / Substance use:  Not Applicable Psych involvement (Current and /or in the community):  No (Comment)  Discharge Needs  Concerns to be addressed:  Discharge Planning Concerns Readmission within the last 30 days:  No Current discharge risk:  Dependent with Mobility Barriers to Discharge:  Continued Medical Work up   ,  M, LCSW 09/17/2017, 2:27 PM  

## 2017-09-17 NOTE — Evaluation (Signed)
Physical Therapy Evaluation Patient Details Name: Marvin Lewis MRN: [id number] DOB: 02-26-1951 Today's Date: 09/17/2017   History of Present Illness  Marvin Lewis  is a 66 y.o. male with a known history of chronic atrial fibrillation, alcohol abuse, COPD with ongoing tobacco abuse, history of coronary artery disease, CHF, attention, hyperlipidemia, depression who was recently admitted to the hospital due to COPD exacerbation and pneumonia now returns after a mechanical fall and noted to have a right hip fracture. Patient says he was up late at night watching TV and attempted to walk from his living room to his bedroom and he fell hit the door of his bedroom. Patient did not have any prodromal symptoms like chest pain palpitations dizziness nausea vomiting or any other associated symptoms. He was having significant pain in his right groin area and his right leg and therefore came to the ER for further evaluation. Patient CT head is negative for acute pathology but x-rays of his pelvis and right hip area showed a right-sided intertrochanteric fracture. Hospitalist services were contacted further treatment and evaluation. Ortho was consulted and pt underwent R hip ORIF with IM nail to repair the fracture. He is POD#1 at time of PT evaluation and is having considerable pain. Per patient he sufferred a L distal radius/ulna fracture 7 weeks ago with a closed reduction and casting at [facility]. Spoke with Dr. Mack Guise who confirmed pt can be WBAT on LUE as long as he is wearing his wrist splint and doesn't have pain.   Clinical Impression  Pt admitted with above diagnosis. Pt currently with functional limitations due to the deficits listed below (see PT Problem List).  Pt requires maxA+1 for bed mobility and modA+2 for transfers with rolling walker. He requires heavy cues for transfers and it was performed twice. Pt allowed to pull up on walker with therapist stabilizes in order to assist  him to come to standing. Once in standing pt struggles to accept weight on RLE. Heavy UE support on rolling walker. Denies increase in L wrist pain with support on rolling walker. Pt able to perform stand pivot transfer from bed to recliner but unable to truly ambulate at this time. Pt with high levels of pain with bed exercises. RN notified who brings Dilaudid as pt has already had a muscle relaxer and additional pain medication this morning. He will need SNF placement at discharge. Pt will benefit from PT services to address deficits in strength, balance, and mobility in order to return to full function at home after leaving SNF.     Follow Up Recommendations SNF    Equipment Recommendations  None recommended by PT    Recommendations for Other Services OT consult     Precautions / Restrictions Precautions Precautions: Fall Restrictions Weight Bearing Restrictions: Yes LUE Weight Bearing: Weight bearing as tolerated (Assuming it is pain-free) RLE Weight Bearing: Weight bearing as tolerated Other Position/Activity Restrictions: Per Krasinksi OK to WB on rolling walker with L wrist brace as long as it is pain-free. If he has pain will need to use a platform      Mobility  Bed Mobility Overal bed mobility: Needs Assistance Bed Mobility: Supine to Sit     Supine to sit: Max assist     General bed mobility comments: Pt requires heavy cues and assist as well as bed rails and HOB elevated. Pt grimaces/groans throughout  Transfers Overall transfer level: Needs assistance Equipment used: Rolling walker (2 wheeled) Transfers: Sit to/from Stand Sit to Stand:  Mod assist;+2 physical assistance         General transfer comment: Pt requires heavy cues for sit to stand transfers. Performed twice with patient. Pt allowed to pull up on walker with therapist stabilizes. Once in standing pt struggles to accept weight on RLE. Heavy UE support on rolling walker. Denies increase in L wrist pain  with support on rolling walker. Pt able to perform stand pivot transfer from bed to recliner but unable to truly ambulate at this time  Ambulation/Gait             General Gait Details: Unable  Stairs            Wheelchair Mobility    Modified Rankin (Stroke Patients Only)       Balance Overall balance assessment: Needs assistance Sitting-balance support: No upper extremity supported Sitting balance-Leahy Scale: Fair Sitting balance - Comments: Initially unstable but improves with extended time in sitting   Standing balance support: Bilateral upper extremity supported Standing balance-Leahy Scale: Poor Standing balance comment: Requires continual support in standing to remain upright                             Pertinent Vitals/Pain Pain Assessment: 0-10 Pain Score: 8  Pain Location: R hip/groin Pain Descriptors / Indicators: Sharp Pain Intervention(s): Premedicated before session;Patient requesting pain meds-RN notified;Other (comment) (RN gave Dilaudid after session)    Home Living Family/patient expects to be discharged to:: Private residence Living Arrangements: Spouse/significant other Available Help at Discharge: Family Type of Home: House Home Access: Stairs to enter Entrance Stairs-Rails: Psychiatric nurse of Steps: [place]: One [place]: [place] - single point;Walker - 2 wheels;Other (comment) (Home O2)      Prior Function Level of Independence: Needs assistance   Gait / Transfers Assistance Needed: Uses spc prn for longer community distances. Normally ambulates without assistive device. 2 falls in the last 12 months  ADL's / Homemaking Assistance Needed: Independent mostly with ADLs, assist from wife with IADLs        Hand Dominance   Dominant Hand: Right    Extremity/Trunk Assessment   Upper Extremity Assessment Upper Extremity Assessment: Overall WFL for tasks assessed    Lower Extremity  Assessment Lower Extremity Assessment: RLE deficits/detail RLE Deficits / Details: Pt requires assist for RLE SLR, SAQ, and hip abduction due to pain and weakness       Communication   Communication: Other (comment) (Mildly difficult to understand due to paralyzed vocal cords)  Cognition Arousal/Alertness: Awake/alert Behavior During Therapy: Restless Overall Cognitive Status: Within Functional Limits for tasks assessed                                        General Comments      Exercises General Exercises - Lower Extremity Ankle Circles/Pumps: AROM;Both;10 reps;Supine Quad Sets: Strengthening;Both;10 reps;Supine Gluteal Sets: Strengthening;Both;10 reps;Supine Short Arc Quad: Strengthening;Right;10 reps;Supine Heel Slides: Strengthening;Right;10 reps;Supine Hip ABduction/ADduction: Strengthening;Right;10 reps;Supine Straight Leg Raises: Strengthening;Right;10 reps;Supine   Assessment/Plan    PT Assessment Patient needs continued PT services  PT Problem List Decreased strength;Decreased range of motion;Decreased activity tolerance;Decreased balance;Decreased mobility;Pain       PT Treatment Interventions DME instruction;Gait training;Stair training;Functional mobility training;Therapeutic activities;Therapeutic exercise;Balance training;Neuromuscular re-education;Patient/family education;Manual techniques    PT Goals (Current goals can be found in the Care Plan section)  Acute Rehab PT Goals Patient Stated Goal: Return to prior function PT Goal Formulation: With patient/family Time For Goal Achievement: 10/01/17 Potential to Achieve Goals: Good    Frequency BID   Barriers to discharge        Co-evaluation               AM-PAC PT "6 Clicks" Daily Activity  Outcome Measure Difficulty turning over in bed (including adjusting bedclothes, sheets and blankets)?: Unable Difficulty moving from lying on back to sitting on the side of the bed? :  Unable Difficulty sitting down on and standing up from a chair with arms (e.g., wheelchair, bedside commode, etc,.)?: Unable Help needed moving to and from a bed to chair (including a wheelchair)?: A Lot Help needed walking in hospital room?: Total Help needed climbing 3-5 steps with a railing? : Total 6 Click Score: 7    End of Session Equipment Utilized During Treatment: Gait belt;Oxygen;Other (comment) (2 L/min) Activity Tolerance: Patient limited by pain Patient left: in chair;with call bell/phone within reach;with chair alarm set;with nursing/sitter in room;Other (comment) (RN administering pain meds, she will connect AV1 boots) Nurse Communication: Mobility status PT Visit Diagnosis: Unsteadiness on feet (R26.81);Muscle weakness (generalized) (M62.81);History of falling (Z91.81);Difficulty in walking, not elsewhere classified (R26.2);Pain Pain - Right/Left: Right Pain - part of body: Hip    Time: 9798-9211 PT Time Calculation (min) (ACUTE ONLY): 40 min   Charges:   PT Evaluation $PT Eval Moderate Complexity: 1 Mod PT Treatments $Therapeutic Exercise: 8-22 mins   PT G Codes:   PT G-Codes **NOT FOR INPATIENT CLASS** Functional Assessment Tool Used: AM-PAC 6 Clicks Basic Mobility Functional Limitation: Mobility: Walking and moving around Mobility: Walking and Moving Around Current Status (H4174): At least 80 percent but less than 100 percent impaired, limited or restricted Mobility: Walking and Moving Around Goal Status [phone]): At least 20 percent but less than 40 percent impaired, limited or restricted    Phillips Grout PT, DPT    Trai Ells 09/17/2017, 11:27 AM

## 2017-09-17 NOTE — Progress Notes (Signed)
Subjective:  POD#1  S/p intramedullary fixation of right intertrochanteric hip fracture.   Patient reports right hippain as marked.  He is sitting up in a chair and his wife reports he did well with PT this AM.   He is eating lunch and tolerating a PO diet.  Objective:   VITALS:   Vitals:   09/16/17 1853 09/16/17 2012 09/17/17 0001 09/17/17 0748  BP: 134/65 (!) 156/70 135/68 138/68  Pulse: 84 80 69 70  Resp: 16 20 19 20   Temp: 98.4 F (36.9 C) 98 F (36.7 C) 98.2 F (36.8 C) 97.8 F (36.6 C)  TempSrc: Oral Oral Oral Oral  SpO2: 93% 95% 94% 95%  Weight:      Height:        PHYSICAL EXAM: Right lower extremity/hip: Neurovascular intact Sensation intact distally Intact pulses distally Dorsiflexion/Plantar flexion intact Incision: dressing C/D/I No cellulitis present Compartment soft  LABS  Results for orders placed or performed during the hospital encounter of 09/16/17 (from the past 24 hour(s))  Glucose, capillary     Status: Abnormal   Collection Time: 09/16/17  3:02 PM  Result Value Ref Range   Glucose-Capillary 133 (H) 65 - 99 mg/dL  Glucose, capillary     Status: Abnormal   Collection Time: 09/16/17  5:18 PM  Result Value Ref Range   Glucose-Capillary 169 (H) 65 - 99 mg/dL  Glucose, capillary     Status: Abnormal   Collection Time: 09/16/17  8:59 PM  Result Value Ref Range   Glucose-Capillary 345 (H) 65 - 99 mg/dL   Comment 1 Notify RN   Basic metabolic panel     Status: Abnormal   Collection Time: 09/17/17  4:10 AM  Result Value Ref Range   Sodium 135 135 - 145 mmol/L   Potassium 4.9 3.5 - 5.1 mmol/L   Chloride 98 (L) 101 - 111 mmol/L   CO2 28 22 - 32 mmol/L   Glucose, Bld 282 (H) 65 - 99 mg/dL   BUN 32 (H) 6 - 20 mg/dL   Creatinine, Ser 1.16 0.61 - 1.24 mg/dL   Calcium 8.2 (L) 8.9 - 10.3 mg/dL   GFR calc non Af Amer >60 >60 mL/min   GFR calc Af Amer >60 >60 mL/min   Anion gap 9 5 - 15  CBC     Status: Abnormal   Collection Time: 09/17/17  4:10 AM   Result Value Ref Range   WBC 10.9 (H) 3.8 - 10.6 K/uL   RBC 3.71 (L) 4.40 - 5.90 MIL/uL   Hemoglobin 8.2 (L) 13.0 - 18.0 g/dL   HCT 27.7 (L) 40.0 - 52.0 %   MCV 74.6 (L) 80.0 - 100.0 fL   MCH 22.2 (L) 26.0 - 34.0 pg   MCHC 29.8 (L) 32.0 - 36.0 g/dL   RDW 22.5 (H) 11.5 - 14.5 %   Platelets 102 (L) 150 - 440 K/uL  Glucose, capillary     Status: Abnormal   Collection Time: 09/17/17  7:45 AM  Result Value Ref Range   Glucose-Capillary 252 (H) 65 - 99 mg/dL  Glucose, capillary     Status: Abnormal   Collection Time: 09/17/17 11:51 AM  Result Value Ref Range   Glucose-Capillary 219 (H) 65 - 99 mg/dL    Ct Head Wo Contrast  Result Date: 09/16/2017 CLINICAL DATA:  Hit head, fall. EXAM: CT HEAD WITHOUT CONTRAST TECHNIQUE: Contiguous axial images were obtained from the base of the skull through the vertex without intravenous contrast.  COMPARISON:  MRI 05/05/2015.  CT 07/14/2012. 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 Mild cerebral atrophy. No acute intracranial abnormality. Specifically, no hemorrhage, hydrocephalus, mass lesion, acute infarction, or significant intracranial injury. Vascular: No hyperdense vessel or unexpected calcification. Skull: No acute calvarial abnormality. Sinuses/Orbits: Visualized paranasal sinuses and mastoids clear. Orbital soft tissues unremarkable. Other: None IMPRESSION: No acute intracranial abnormality. Electronically Signed   By: Rolm Baptise M.D.   On: 09/16/2017 07:47   Dg Hip Operative Unilat W Or W/o Pelvis Right  Result Date: 09/16/2017 CLINICAL DATA:  Right hip surgery. EXAM: OPERATIVE RIGHT HIP (WITH PELVIS IF PERFORMED) 23 VIEWS TECHNIQUE: Fluoroscopic spot image(s) were submitted for interpretation post-operatively. COMPARISON:  09/16/2017 FINDINGS: Patient status post ORIF right intertrochanteric hip fracture. Hardware intact. Anatomic alignment. Twenty-three images obtained. 1 minutes 22 seconds fluoroscopy time observed.  IMPRESSION: ORIF right intertrochanteric hip fracture. Electronically Signed   By: Marcello Moores  Register   On: 09/16/2017 14:44   Dg Hip Unilat  With Pelvis 2-3 Views Right  Result Date: 09/16/2017 CLINICAL DATA:  Injury.  Fall. EXAM: DG HIP (WITH OR WITHOUT PELVIS) 2-3V RIGHT COMPARISON:  No recent. FINDINGS: Right intertrochanteric fracture noted of the right femur. Displaced fracture of the lesser trochanter noted. Plate and screw fixation left hip. Degenerative changes both hips . Aortoiliac and peripheral vascular calcification. Surgical clips in the pelvis. IMPRESSION: 1. Right intertrochanteric proximal femoral fracture. Displaced fracture of the lesser trochanter noted. 2. Wire plate and screw fixation left hip. 3. Aorto iliac and peripheral vascular disease . Electronically Signed   By: Marcello Moores  Register   On: 09/16/2017 08:13   Dg Femur, Min 2 Views Right  Result Date: 09/16/2017 CLINICAL DATA:  Status post intramedullary nailing of the right femur for an intertrochanteric fracture. EXAM: RIGHT FEMUR 2 VIEWS COMPARISON:  Intraoperative fluoro spot radiographs of today's date FINDINGS: The patient has undergone ORIF for an intertrochanteric fracture. An intramedullary rod with telescoping screw is present. The securing screws in the lower femur are in reasonable position. The lesser trochanter remains displaced. IMPRESSION: No postprocedure complication following ORIF for an intertrochanteric fracture of the right hip. Electronically Signed   By: David  Martinique M.D.   On: 09/16/2017 16:02    Assessment/Plan: 1 Day Post-Op   Active Problems:   Closed right hip fracture [facility])  Patient doing well post-op.  Labs are stable.  Patient progressing with PT.  I have adjusted narcotic doses and schedules to provide better pain control.   Foley catheter d/c'd.  Lovenox to start today for DVT prophylaxis.    Thornton Park , MD 09/17/2017, 12:24 PM

## 2017-09-17 NOTE — Evaluation (Signed)
Occupational Therapy Evaluation Patient Details Name: Marvin Lewis MRN: [id number] DOB: July 06, 1951 Today's Date: 09/17/2017    History of Present Illness Marvin Lewis  is a 66 y.o. male with a known history of chronic atrial fibrillation, alcohol abuse, COPD with ongoing tobacco abuse, history of coronary artery disease, CHF, attention, hyperlipidemia, depression who was recently admitted to the hospital due to COPD exacerbation and pneumonia now returns after a mechanical fall and noted to have a right hip fracture. Patient says he was up late at night watching TV and attempted to walk from his living room to his bedroom and he fell hit the door of his bedroom. Patient did not have any prodromal symptoms like chest pain palpitations dizziness nausea vomiting or any other associated symptoms. He was having significant pain in his right groin area and his right leg and therefore came to the ER for further evaluation. Patient CT head is negative for acute pathology but x-rays of his pelvis and right hip area showed a right-sided intertrochanteric fracture. Hospitalist services were contacted further treatment and evaluation. Ortho was consulted and pt underwent R hip ORIF with IM nail to repair the fracture. He is POD#1 at time of OT evaluation and is having considerable pain. Per patient he sufferred a L distal radius/ulna fracture 7 weeks ago with a closed reduction and casting at [employer] [facility] [facility]. Spoke with Dr. Mack Guise who confirmed pt can be WBAT on LUE as long as he is wearing his wrist splint and doesn't have pain.    Clinical Impression   Pt is 66 year old male s/p R hip ORIF with IM nail after mechanical fall. Pt lives at home with his spouse. Pt was independent in all basic ADLs prior to surgery and is eager to return to PLOF.  Pt is currently limited in functional ADLs due to pain, decreased ROM,decreased activity tolerance, and decreased strength.  Pt requires moderate to  maximal assist for LB dressing and bathing skills and would benefit from continued skilled OT services for education in assistive devices, functional mobility, and education in recommendations for home modifications to increase safety and prevent falls.  Pt is a good candidate for SNF to continue rehabilitation.       Follow Up Recommendations  SNF    Equipment Recommendations  3 in 1 bedside commode    Recommendations for Other Services       Precautions / Restrictions Precautions Precautions: Fall Restrictions Weight Bearing Restrictions: Yes LUE Weight Bearing: Weight bearing as tolerated (assuming it is pain free) RLE Weight Bearing: Weight bearing as tolerated Other Position/Activity Restrictions: Per Krasinksi OK to WB on rolling walker with L wrist brace as long as it is pain-free. If he has pain will need to use a platform      Mobility Bed Mobility   General bed mobility comments: deferred, pt up in recliner  Transfers         General transfer comment: deferred; RN gave pain meds during session and pt became very sleepy afterwards (was in too much pain to attempt just prior)    Balance Overall balance assessment: Needs assistance Sitting-balance support: No upper extremity supported Sitting balance-Leahy Scale: Fair Sitting balance - Comments: Initially unstable but improves with extended time in sitting                             ADL either performed or assessed with clinical judgement   ADL Overall ADL's :  Needs assistance/impaired Eating/Feeding: Sitting;Set up   Grooming: Sitting;Set up   Upper Body Bathing: Sitting;Set up;Supervision/ safety   Lower Body Bathing: Sitting/lateral leans;Maximal assistance;Moderate assistance Lower Body Bathing Details (indicate cue type and reason): pt/spouse educated in use of 3:1 for seated shower to maximize safety, pt/spouse verbalized understanding Upper Body Dressing : Sitting;Min guard;Set up    Lower Body Dressing: Sitting/lateral leans;Maximal assistance;Moderate assistance Lower Body Dressing Details (indicate cue type and reason): pt/spouse educated in use of AE for LB dressing tasks with verbal instruction and visual demonstration, pt/spouse verbalized understanding   Toilet Transfer Details (indicate cue type and reason): deferred due to safety                 Vision Baseline Vision/History: Wears glasses Wears Glasses: Reading only Patient Visual Report: No change from baseline Vision Assessment?: No apparent visual deficits     Perception     Praxis      Pertinent Vitals/Pain Pain Assessment: 0-10 Pain Score: 9  Pain Location: R hip/groin Pain Descriptors / Indicators: Sharp Pain Intervention(s): Limited activity within patient's tolerance;Monitored during session;Patient requesting pain meds-RN notified;RN gave pain meds during session     Hand Dominance Right   Extremity/Trunk Assessment Upper Extremity Assessment Upper Extremity Assessment: Overall WFL for tasks assessed   Lower Extremity Assessment Lower Extremity Assessment: Defer to PT evaluation;RLE deficits/detail       Communication Communication Communication: Other (comment) (mild difficulty with understanding due to pt's frozen vocal cords)   Cognition Arousal/Alertness: Awake/alert Behavior During Therapy: Restless Overall Cognitive Status: Within Functional Limits for tasks assessed                                     General Comments       Exercises Other Exercises Other Exercises: pt/spouse educated in falls prevention strategies and home/routines modifications to maximize safety and functional independence, pt/spouse verbalized understanding   Shoulder Instructions      Home Living Family/patient expects to be discharged to:: Private residence Living Arrangements: Spouse/significant other Available Help at Discharge: Family Type of Home: House Home  Access: Stairs to enter Technical brewer of Steps: 4 Entrance Stairs-Rails: Right;Left Home Layout: One level     Bathroom Shower/Tub: Occupational psychologist: Standard     Home Equipment: Cane - single point;Walker - 2 wheels;Other (comment) (home O2)          Prior Functioning/Environment Level of Independence: Needs assistance  Gait / Transfers Assistance Needed: Uses spc prn for longer community distances. Normally ambulates without assistive device. 2 falls in the last 12 months ADL's / Homemaking Assistance Needed: Independent mostly with ADLs, assist from wife with IADLs and medication mgt            OT Problem List: Decreased strength;Pain;Decreased range of motion;Decreased activity tolerance;Decreased safety awareness;Impaired balance (sitting and/or standing);Decreased knowledge of use of DME or AE      OT Treatment/Interventions: Self-care/ADL training;Therapeutic exercise;Therapeutic activities;DME and/or AE instruction;Energy conservation;Patient/family education    OT Goals(Current goals can be found in the care plan section) Acute Rehab OT Goals Patient Stated Goal: Return to prior function OT Goal Formulation: With patient/family Time For Goal Achievement: 10/01/17 Potential to Achieve Goals: Good  OT Frequency: Min 1X/week   Barriers to D/C:            Co-evaluation  AM-PAC PT "6 Clicks" Daily Activity     Outcome Measure Help from another person eating meals?: None Help from another person taking care of personal grooming?: None Help from another person toileting, which includes using toliet, bedpan, or urinal?: A Lot Help from another person bathing (including washing, rinsing, drying)?: A Lot Help from another person to put on and taking off regular upper body clothing?: A Little Help from another person to put on and taking off regular lower body clothing?: A Lot 6 Click Score: 17   End of Session Nurse  Communication: Patient requests pain meds  Activity Tolerance: Other (comment) (pt limited initially by pain, then lethargy/fatigue following pain meds) Patient left: in chair;with call bell/phone within reach;with chair alarm set;with family/visitor present  OT Visit Diagnosis: Other abnormalities of gait and mobility (R26.89);History of falling (Z91.81);Muscle weakness (generalized) (M62.81)                Time: 1326-1350 OT Time Calculation (min): 24 min Charges:  OT General Charges $OT Visit: 1 Visit OT Evaluation $OT Eval Low Complexity: 1 Low OT Treatments $Self Care/Home Management : 8-22 mins G-Codes: OT G-codes **NOT FOR INPATIENT CLASS** Functional Assessment Tool Used: AM-PAC 6 Clicks Daily Activity;Clinical judgement Functional Limitation: Self care Self Care Current Status (Q6834): At least 40 percent but less than 60 percent impaired, limited or restricted Self Care Goal Status (H9622): At least 20 percent but less than 40 percent impaired, limited or restricted   Jeni Salles, MPH, MS, OTR/L ascom [phone] 09/17/17, 3:54 PM

## 2017-09-17 NOTE — Progress Notes (Signed)
Physical Therapy Treatment Patient Details Name: Marvin Lewis MRN: [id number] DOB: 1951-02-18 Today's Date: 09/17/2017    History of Present Illness Marvin Lewis  is a 66 y.o. male with a known history of chronic atrial fibrillation, alcohol abuse, COPD with ongoing tobacco abuse, history of coronary artery disease, CHF, attention, hyperlipidemia, depression who was recently admitted to the hospital due to COPD exacerbation and pneumonia now returns after a mechanical fall and noted to have a right hip fracture. Patient says he was up late at night watching TV and attempted to walk from his living room to his bedroom and he fell hit the door of his bedroom. Patient did not have any prodromal symptoms like chest pain palpitations dizziness nausea vomiting or any other associated symptoms. He was having significant pain in his right groin area and his right leg and therefore came to the ER for further evaluation. Patient CT head is negative for acute pathology but x-rays of his pelvis and right hip area showed a right-sided intertrochanteric fracture. Hospitalist services were contacted further treatment and evaluation. Ortho was consulted and pt underwent R hip ORIF with IM nail to repair the fracture. He is POD#1 at time of PT evaluation and is having considerable pain. Per patient he sufferred a L distal radius/ulna fracture 7 weeks ago with a closed reduction and casting at [facility]. Spoke with Dr. Mack Guise who confirmed pt can be WBAT on LUE as long as he is wearing his wrist splint and doesn't have pain.     PT Comments    Pt was able to perform seated exercises bilaterally during treatment with VC's and encouragement from PT.  He requires 2 person assist for squat pivot transfer from chair to bed and is still not ambulatory.  Pt was not able to transfer with use of RW from chair to bed due to guarding and hyperawareness of pain.  Pt requires frequent monitoring of 02 sats due  to decrease in O2% during activity. Pt will benefit from PT services to address deficits in strength, balance, and mobility in order to return to full function at home.    Follow Up Recommendations  SNF     Equipment Recommendations  None recommended by PT    Recommendations for Other Services OT consult     Precautions / Restrictions Precautions Precautions: Fall Restrictions Weight Bearing Restrictions: Yes LUE Weight Bearing: Weight bearing as tolerated (Assuming it is pain-free) RLE Weight Bearing: Weight bearing as tolerated Other Position/Activity Restrictions: Per Krasinksi OK to WB on rolling walker with L wrist brace as long as it is pain-free. If he has pain will need to use a platform    Mobility  Bed Mobility Overal bed mobility: Needs Assistance Bed Mobility: Sit to Supine       Sit to supine: +2 for physical assistance;Max assist   General bed mobility comments: Pt requires frequent VC's for body mechanics and safety due to impulsive movements.  Pt is hyperaware of pain level and severely guards all movements.  Transfers Overall transfer level: Needs assistance   Transfers: Sit to/from Stand Sit to Stand: +2 physical assistance;Max assist         General transfer comment: Pt requires heavy VC's for proper body mechanics during STS.  PT attempted use of RW for transfer but pt experienced difficulty with sequencing due to guarding and hesitancy due to pain.  PT was able to successfully transfer pt via squat pivot transfer with max 2 person assist.  Ambulation/Gait  General Gait Details: Unable   Stairs            Wheelchair Mobility    Modified Rankin (Stroke Patients Only)       Balance Overall balance assessment: Needs assistance Sitting-balance support: No upper extremity supported Sitting balance-Leahy Scale: Fair Sitting balance - Comments: Initially unstable but improves with extended time in sitting   Standing  balance support: Bilateral upper extremity supported Standing balance-Leahy Scale: Poor Standing balance comment: Requires continual support in standing to remain upright                            Cognition Arousal/Alertness: Awake/alert Behavior During Therapy: Restless Overall Cognitive Status: Within Functional Limits for tasks assessed                                        Exercises General Exercises - Lower Extremity Long Arc Quad: Strengthening;Both;10 reps;Seated Heel Slides: Strengthening;Both;10 reps;Seated Hip ABduction/ADduction: Strengthening;10 reps;Both;Seated Hip Flexion/Marching: Strengthening;10 reps;Seated;Both Heel Raises: Strengthening;Both;10 reps;Seated    General Comments        Pertinent Vitals/Pain Pain Assessment: 0-10 Pain Score: 9  Pain Location: R hip/groin, L rib pain 7/10 Pain Descriptors / Indicators: Sharp Pain Intervention(s): Premedicated before session    Home Living                      Prior Function            PT Goals (current goals can now be found in the care plan section) Acute Rehab PT Goals Patient Stated Goal: Return to prior function PT Goal Formulation: With patient/family Time For Goal Achievement: 10/01/17 Potential to Achieve Goals: Good Progress towards PT goals: Progressing toward goals    Frequency    BID      PT Plan Current plan remains appropriate    Co-evaluation              AM-PAC PT "6 Clicks" Daily Activity  Outcome Measure  Difficulty turning over in bed (including adjusting bedclothes, sheets and blankets)?: Unable Difficulty moving from lying on back to sitting on the side of the bed? : Unable Difficulty sitting down on and standing up from a chair with arms (e.g., wheelchair, bedside commode, etc,.)?: Unable Help needed moving to and from a bed to chair (including a wheelchair)?: A Lot Help needed walking in hospital room?: Total Help needed  climbing 3-5 steps with a railing? : Total 6 Click Score: 7    End of Session Equipment Utilized During Treatment: Gait belt;Oxygen;Other (comment) (2 L/min) Activity Tolerance: Patient limited by pain Patient left: with call bell/phone within reach;with nursing/sitter in room;Other (comment);in bed;with bed alarm set;with SCD's reapplied   PT Visit Diagnosis: Unsteadiness on feet (R26.81);Muscle weakness (generalized) (M62.81);History of falling (Z91.81);Difficulty in walking, not elsewhere classified (R26.2);Pain Pain - Right/Left: Right Pain - part of body: Hip     Time: 1410-1436 PT Time Calculation (min) (ACUTE ONLY): 26 min  Charges:  $Therapeutic Exercise: 8-22 mins $Therapeutic Activity: 8-22 mins                    G Codes:       Lyndel Safe Stesha Neyens PT, DPT     Jaxston Chohan 09/17/2017, 3:14 PM

## 2017-09-17 NOTE — Anesthesia Postprocedure Evaluation (Signed)
Anesthesia Post Note  Patient: Marvin Lewis  Procedure(s) Performed: INTRAMEDULLARY (IM) NAIL INTERTROCHANTRIC (Right Hip)  Patient location during evaluation: Nursing Unit Anesthesia Type: Spinal Level of consciousness: oriented and awake and alert Pain management: pain level controlled Vital Signs Assessment: post-procedure vital signs reviewed and stable Respiratory status: spontaneous breathing, respiratory function stable and patient connected to nasal cannula oxygen Cardiovascular status: blood pressure returned to baseline and stable Postop Assessment: no headache, no backache and no apparent nausea or vomiting Anesthetic complications: no     Last Vitals:  Vitals:   09/16/17 2012 09/17/17 0001  BP: (!) 156/70 135/68  Pulse: 80 69  Resp: 20 19  Temp: 36.7 C 36.8 C  SpO2: 95% 94%    Last Pain:  Vitals:   09/17/17 0405  TempSrc:   PainSc: 2                  Silvana Newness A

## 2017-09-18 LAB — CBC
HEMATOCRIT: 26.9 % — AB (ref 40.0–52.0)
HEMOGLOBIN: 8 g/dL — AB (ref 13.0–18.0)
MCH: 22.2 pg — AB (ref 26.0–34.0)
MCHC: 29.9 g/dL — ABNORMAL LOW (ref 32.0–36.0)
MCV: 74.3 fL — AB (ref 80.0–100.0)
Platelets: 107 10*3/uL — ABNORMAL LOW (ref 150–440)
RBC: 3.62 MIL/uL — AB (ref 4.40–5.90)
RDW: 23 % — ABNORMAL HIGH (ref 11.5–14.5)
WBC: 13 10*3/uL — ABNORMAL HIGH (ref 3.8–10.6)

## 2017-09-18 LAB — BASIC METABOLIC PANEL
ANION GAP: 7 (ref 5–15)
BUN: 41 mg/dL — AB (ref 6–20)
CHLORIDE: 98 mmol/L — AB (ref 101–111)
CO2: 31 mmol/L (ref 22–32)
Calcium: 8.5 mg/dL — ABNORMAL LOW (ref 8.9–10.3)
Creatinine, Ser: 1.31 mg/dL — ABNORMAL HIGH (ref 0.61–1.24)
GFR calc Af Amer: >60 mL/min (ref >60)
GFR calc non Af Amer: 56 mL/min — ABNORMAL LOW (ref >60)
GLUCOSE: 202 mg/dL — AB (ref 65–99)
POTASSIUM: 4.3 mmol/L (ref 3.5–5.1)
Sodium: 136 mmol/L (ref 135–145)

## 2017-09-18 LAB — GLUCOSE, CAPILLARY
GLUCOSE-CAPILLARY: 216 mg/dL — AB (ref 65–99)
Glucose-Capillary: 184 mg/dL — ABNORMAL HIGH (ref 65–99)
Glucose-Capillary: 220 mg/dL — ABNORMAL HIGH (ref 65–99)
Glucose-Capillary: 175 mg/dL — ABNORMAL HIGH (ref 65–99)

## 2017-09-18 NOTE — Progress Notes (Signed)
Physical Therapy Treatment Patient Details Name: Marvin Lewis MRN: [id number] DOB: 05-17-1951 Today's Date: 09/18/2017    History of Present Illness Marvin Lewis  is a 66 y.o. male with a known history of chronic atrial fibrillation, alcohol abuse, COPD with ongoing tobacco abuse, history of coronary artery disease, CHF, attention, hyperlipidemia, depression who was recently admitted to the hospital due to COPD exacerbation and pneumonia now returns after a mechanical fall and noted to have a right hip fracture. Patient says he was up late at night watching TV and attempted to walk from his living room to his bedroom and he fell hit the door of his bedroom. Patient did not have any prodromal symptoms like chest pain palpitations dizziness nausea vomiting or any other associated symptoms. He was having significant pain in his right groin area and his right leg and therefore came to the ER for further evaluation. Patient CT head is negative for acute pathology but x-rays of his pelvis and right hip area showed a right-sided intertrochanteric fracture. Hospitalist services were contacted further treatment and evaluation. Ortho was consulted and pt underwent R hip ORIF with IM nail to repair the fracture. He is POD#1 at time of PT evaluation and is having considerable pain. Per patient he sufferred a L distal radius/ulna fracture 7 weeks ago with a closed reduction and casting at [facility]. Spoke with Dr. Mack Guise who confirmed pt can be WBAT on LUE as long as he is wearing his wrist splint and doesn't have pain.     PT Comments    Pt continues to be limited with therapy due to significant R hip pain. He is able to complete all bed exercises as instructed but grimaces, groans, and yells with all active and passive motion of RLE. He is very weak on the RLE with increase in pain with sit to stand transfers. Heavy verbal and tactile cues required for hand placement and sequencing. Once  standing requires CGA for safety in standing. He is able to perform shuffling stand pivot transfer from bed to recliner with maxA+2. Unable to truly ambulate at this time. Pt will benefit from PT services to address deficits in strength, balance, and mobility in order to return to full function at home.    Follow Up Recommendations  SNF     Equipment Recommendations  None recommended by PT    Recommendations for Other Services OT consult     Precautions / Restrictions Precautions Precautions: Fall Restrictions Weight Bearing Restrictions: Yes LUE Weight Bearing: Weight bearing as tolerated RLE Weight Bearing: Weight bearing as tolerated Other Position/Activity Restrictions: Per Krasinksi OK to WB on rolling walker with L wrist brace as long as it is pain-free. If he has pain will need to use a platform    Mobility  Bed Mobility Overal bed mobility: Needs Assistance Bed Mobility: Supine to Sit     Supine to sit: Max assist;+2 for physical assistance     General bed mobility comments: Pt with significant pain during bed moblility. Yells out in pain. Once upright pt with occasional lean to the left in sitting requiring UE support to stabilize  Transfers Overall transfer level: Needs assistance Equipment used: Rolling walker (2 wheeled) Transfers: Sit to/from Stand Sit to Stand: +2 physical assistance;Max assist         General transfer comment: Pt is very weak on the RLE with increase in pain with sit to stand transfers. Heavy verbal and tactile cues required for hand placement and sequencing. Once  standing requires CGA for safety in standing. He is able to perform shuffling stand pivot transfer from bed to recliner. Unable to truly ambulate at this time.  Ambulation/Gait             General Gait Details: Unable   Stairs            Wheelchair Mobility    Modified Rankin (Stroke Patients Only)       Balance Overall balance assessment: Needs  assistance Sitting-balance support: No upper extremity supported Sitting balance-Leahy Scale: Fair Sitting balance - Comments: Initially unstable but improves with extended time in sitting   Standing balance support: Bilateral upper extremity supported Standing balance-Leahy Scale: Poor Standing balance comment: Requires continual support in standing to remain upright                            Cognition Arousal/Alertness: Awake/alert Behavior During Therapy: Restless Overall Cognitive Status: Within Functional Limits for tasks assessed                                        Exercises General Exercises - Lower Extremity Ankle Circles/Pumps: AROM;Both;10 reps;Supine Quad Sets: Strengthening;Both;10 reps;Supine Gluteal Sets: Strengthening;Both;10 reps;Supine Short Arc Quad: Strengthening;Right;10 reps;Supine Heel Slides: Strengthening;Both;10 reps;Seated Hip ABduction/ADduction: Strengthening;10 reps;Both;Seated Straight Leg Raises: Strengthening;Right;10 reps;Supine    General Comments        Pertinent Vitals/Pain Pain Assessment: 0-10 Pain Score: 9  Pain Location: R hip/groin Pain Descriptors / Indicators: Sharp Pain Intervention(s): Monitored during session;Premedicated before session;Limited activity within patient's tolerance    Home Living                      Prior Function            PT Goals (current goals can now be found in the care plan section) Acute Rehab PT Goals Patient Stated Goal: Return to prior function PT Goal Formulation: With patient/family Time For Goal Achievement: 10/01/17 Potential to Achieve Goals: Good Progress towards PT goals: Not progressing toward goals - comment (Limited significant by pain)    Frequency    BID      PT Plan Current plan remains appropriate    Co-evaluation              AM-PAC PT "6 Clicks" Daily Activity  Outcome Measure  Difficulty turning over in bed  (including adjusting bedclothes, sheets and blankets)?: Unable Difficulty moving from lying on back to sitting on the side of the bed? : Unable Difficulty sitting down on and standing up from a chair with arms (e.g., wheelchair, bedside commode, etc,.)?: Unable Help needed moving to and from a bed to chair (including a wheelchair)?: A Lot Help needed walking in hospital room?: Total Help needed climbing 3-5 steps with a railing? : Total 6 Click Score: 7    End of Session Equipment Utilized During Treatment: Gait belt;Oxygen;Other (comment) (2 L/min) Activity Tolerance: Patient limited by pain Patient left: with call bell/phone within reach;with SCD's reapplied;in chair;with chair alarm set;with family/visitor present Nurse Communication: Mobility status PT Visit Diagnosis: Unsteadiness on feet (R26.81);Muscle weakness (generalized) (M62.81);History of falling (Z91.81);Difficulty in walking, not elsewhere classified (R26.2);Pain Pain - Right/Left: Right Pain - part of body: Hip     Time: 2706-2376 PT Time Calculation (min) (ACUTE ONLY): 26 min  Charges:  $Therapeutic Exercise: 8-22 mins $Therapeutic  Activity: 8-22 mins                    G Codes:       Phillips Grout PT, DPT     Huprich,Jason 09/18/2017, 11:43 AM

## 2017-09-18 NOTE — Progress Notes (Signed)
Inpatient Diabetes Program Recommendations  AACE/ADA: New Consensus Statement on Inpatient Glycemic Control (2015)  Target Ranges:  Prepandial:   less than 140 mg/dL      [facility] postprandial:   less than 180 mg/dL (1-2 hours)      Critically ill patients:  140 - 180 mg/dL   Lab Results  Component Value Date   GLUCAP 184 (H) 09/18/2017   HGBA1C 9.9 (H) 04/23/2016    Review of Glycemic Control  Results for HELEN, CUFF (MRN [id number]) as of 09/18/2017 10:45  Ref. Range 09/17/2017 07:45 09/17/2017 11:51 09/17/2017 16:22 09/17/2017 20:54 09/18/2017 07:38  Glucose-Capillary Latest Ref Range: 65 - 99 mg/dL 252 (H) 219 (H) 158 (H) 213 (H) 184 (H)   Diabetes history: DM 2 Outpatient Diabetes medications: Tresiba 48 units QHS Current orders for Inpatient glycemic control: Lantus 48 units QHS, Novolog Sensitive 0-9 units tid + hs  Inpatient Diabetes Program Recommendations:  Agree with current medications for blood sugar management.   Gentry Fitz, RN, BA, MHA, CDE Diabetes Coordinator Inpatient Diabetes Program  [phone] (Team Pager) [phone] ([place]) 09/18/2017 10:50 AM

## 2017-09-18 NOTE — Progress Notes (Signed)
  Subjective:  POD #2 s/p IM fixation of right hip fracture.  Patient reports pain as moderate. Patient making progress with physical therapy. He is tolerating by mouth diet.   Objective:   VITALS:   Vitals:   09/17/17 0748 09/17/17 1816 09/17/17 1925 09/18/17 0740  BP: 138/68 123/72 139/66 136/73  Pulse: 70 92 93 72  Resp: 20  20 20   Temp: 97.8 F (36.6 C)  99.1 F (37.3 C) 98.8 F (37.1 C)  TempSrc: Oral   Oral  SpO2: 95% 94% 97% 93%  Weight:      Height:        PHYSICAL EXAM: Right lower extremity: Neurovascular intact Sensation intact distally Intact pulses distally Dorsiflexion/Plantar flexion intact Incision: dressing C/D/I No cellulitis present Compartment soft  LABS  Results for orders placed or performed during the hospital encounter of 09/16/17 (from the past 24 hour(s))  Glucose, capillary     Status: Abnormal   Collection Time: 09/17/17  8:54 PM  Result Value Ref Range   Glucose-Capillary 213 (H) 65 - 99 mg/dL   Comment 1 Notify RN   CBC     Status: Abnormal   Collection Time: 09/18/17  3:49 AM  Result Value Ref Range   WBC 13.0 (H) 3.8 - 10.6 K/uL   RBC 3.62 (L) 4.40 - 5.90 MIL/uL   Hemoglobin 8.0 (L) 13.0 - 18.0 g/dL   HCT 26.9 (L) 40.0 - 52.0 %   MCV 74.3 (L) 80.0 - 100.0 fL   MCH 22.2 (L) 26.0 - 34.0 pg   MCHC 29.9 (L) 32.0 - 36.0 g/dL   RDW 23.0 (H) 11.5 - 14.5 %   Platelets 107 (L) 150 - 440 K/uL  Basic metabolic panel     Status: Abnormal   Collection Time: 09/18/17  3:49 AM  Result Value Ref Range   Sodium 136 135 - 145 mmol/L   Potassium 4.3 3.5 - 5.1 mmol/L   Chloride 98 (L) 101 - 111 mmol/L   CO2 31 22 - 32 mmol/L   Glucose, Bld 202 (H) 65 - 99 mg/dL   BUN 41 (H) 6 - 20 mg/dL   Creatinine, Ser 1.31 (H) 0.61 - 1.24 mg/dL   Calcium 8.5 (L) 8.9 - 10.3 mg/dL   GFR calc non Af Amer 56 (L) >60 mL/min   GFR calc Af Amer >60 >60 mL/min   Anion gap 7 5 - 15  Glucose, capillary     Status: Abnormal   Collection Time: 09/18/17  7:38 AM   Result Value Ref Range   Glucose-Capillary 184 (H) 65 - 99 mg/dL  Glucose, capillary     Status: Abnormal   Collection Time: 09/18/17 11:43 AM  Result Value Ref Range   Glucose-Capillary 220 (H) 65 - 99 mg/dL  Glucose, capillary     Status: Abnormal   Collection Time: 09/18/17  5:11 PM  Result Value Ref Range   Glucose-Capillary 175 (H) 65 - 99 mg/dL    No results found.  Assessment/Plan: 2 Days Post-Op   Active Problems:   Closed right hip fracture [facility])  Patient doing well from an orthopedic standpoint. Continue physical therapy. Patient is weightbearing as tolerated in the right lower extremity. He will be going to rehabilitation upon discharge. Continue Lovenox for DVT prophylaxis.    Thornton Park , MD 09/18/2017, 5:39 PM

## 2017-09-18 NOTE — Progress Notes (Signed)
SNF level PASARR has been received, [id number] E expires on 10/17/2017. Plan is for patient to D/C to [facility] tomorrow pending medical clearance.   [employer], LCSW [phone]

## 2017-09-18 NOTE — Progress Notes (Signed)
Occupational Therapy Treatment Patient Details Name: Marvin Lewis MRN: [id number] DOB: [birth year] Today's Date: 09/18/2017    History of present illness Marvin Lewis  is a 66 y.o. male with a known history of chronic atrial fibrillation, alcohol abuse, COPD with ongoing tobacco abuse, history of coronary artery disease, CHF, attention, hyperlipidemia, depression who was recently admitted to the hospital due to COPD exacerbation and pneumonia now returns after a mechanical fall and noted to have a right hip fracture. Patient says he was up late at night watching TV and attempted to walk from his living room to his bedroom and he fell hit the door of his bedroom. Patient did not have any prodromal symptoms like chest pain palpitations dizziness nausea vomiting or any other associated symptoms. He was having significant pain in his right groin area and his right leg and therefore came to the ER for further evaluation. Patient CT head is negative for acute pathology but x-rays of his pelvis and right hip area showed a right-sided intertrochanteric fracture. Hospitalist services were contacted further treatment and evaluation. Ortho was consulted and pt underwent R hip ORIF with IM nail to repair the fracture. Per patient he suffered a L distal radius/ulna fracture 7 weeks ago with a closed reduction and casting at [facility]. Spoke with Dr. Mack Guise who confirmed pt can be WBAT on LUE as long as he is wearing his wrist splint and doesn't have pain.    OT comments  Pt seen for OT treatment session this afternoon, at end of PT session. Pt in significant amount of pain and max assist x2 for squat pivot transfer to EOB from recliner and for sit to supine back to bed. RN notified of pt's request for muscle relaxer. Pt/spouse educated in cognitive behavioral pain coping strategies including progressive muscle relaxation, distraction, and pleasant imagery, to better support pt's overall pain  mgt. Very limited by pain this date. Will continue to progress.   Follow Up Recommendations  SNF    Equipment Recommendations  3 in 1 bedside commode    Recommendations for Other Services      Precautions / Restrictions Precautions Precautions: Fall Restrictions Weight Bearing Restrictions: Yes LUE Weight Bearing: Weight bearing as tolerated RLE Weight Bearing: Weight bearing as tolerated Other Position/Activity Restrictions: Per Krasinksi OK to WB on rolling walker with L wrist brace as long as it is pain-free. If he has pain will need to use a platform       Mobility Bed Mobility Overal bed mobility: Needs Assistance Bed Mobility: Sit to Supine     Supine to sit: Max assist;+2 for physical assistance Sit to supine: +2 for physical assistance;Max assist   General bed mobility comments: Pt requires maxA+2 for trunk and LE assist when returning to bed. Pt yells in pain with all bed mobility  Transfers Overall transfer level: Needs assistance Equipment used: Rolling walker (2 wheeled) Transfers: Sit to/from Stand Sit to Stand: +2 physical assistance;Max assist         General transfer comment: Pt struggles significantly with squat pivot transfer from recliner back to bed, requiring heavy verbal and tactile cues as well as maxA+2. Pt with minimal weight acceptance to RLE during transfer. He yells out in pain during transfer    Balance Overall balance assessment: Needs assistance Sitting-balance support: No upper extremity supported Sitting balance-Leahy Scale: Fair Sitting balance - Comments: Initially unstable but improves with extended time in sitting   Standing balance support: Bilateral upper extremity supported Standing balance-Leahy  Scale: Poor Standing balance comment: Unable to come to upright standing during PM session                           ADL either performed or assessed with clinical judgement   ADL Overall ADL's : Needs  assistance/impaired                                             Vision Baseline Vision/History: Wears glasses Wears Glasses: Reading only Patient Visual Report: No change from baseline Vision Assessment?: No apparent visual deficits   Perception     Praxis      Cognition Arousal/Alertness: Awake/alert Behavior During Therapy: Restless Overall Cognitive Status: Within Functional Limits for tasks assessed                                          Exercises Other Exercises Other Exercises: Pt/spouse educated in cognitive behavioral pain coping strategies to better manage pt's pain, including distration, progressive muscle relaxation, and pleasant imagery. Pt/spouse verbalized understanding. Would benefit from additional training to support recall and carry over   Shoulder Instructions       General Comments      Pertinent Vitals/ Pain       Pain Assessment: 0-10 Pain Score: 9  Pain Location: R hip/groin Pain Descriptors / Indicators: Sharp Pain Intervention(s): Limited activity within patient's tolerance;Monitored during session;Repositioned;Utilized relaxation techniques;Patient requesting pain meds-RN notified  Home Living                                          Prior Functioning/Environment              Frequency  Min 1X/week        Progress Toward Goals  OT Goals(current goals can now be found in the care plan section)  Progress towards OT goals: Not progressing toward goals - comment;OT to reassess next treatment  Acute Rehab OT Goals Patient Stated Goal: Return to prior function OT Goal Formulation: With patient/family Time For Goal Achievement: 10/01/17 Potential to Achieve Goals: Good  Plan Discharge plan remains appropriate;Frequency remains appropriate    Co-evaluation                 AM-PAC PT "6 Clicks" Daily Activity     Outcome Measure   Help from another person eating  meals?: None Help from another person taking care of personal grooming?: None Help from another person toileting, which includes using toliet, bedpan, or urinal?: A Lot Help from another person bathing (including washing, rinsing, drying)?: A Lot Help from another person to put on and taking off regular upper body clothing?: A Little Help from another person to put on and taking off regular lower body clothing?: A Lot 6 Click Score: 17    End of Session Equipment Utilized During Treatment: Gait belt;Rolling walker;Oxygen  OT Visit Diagnosis: Other abnormalities of gait and mobility (R26.89);History of falling (Z91.81);Muscle weakness (generalized) (M62.81)   Activity Tolerance Patient limited by pain   Patient Left in chair;with call bell/phone within reach;with bed alarm set;with family/visitor present;with SCD's reapplied   Nurse Communication Patient requests  pain meds        Time: 3329-5188 OT Time Calculation (min): 20 min  Charges: OT General Charges $OT Visit: 1 Visit OT Treatments $Therapeutic Activity: 8-22 mins  Jeni Salles, MPH, MS, OTR/L ascom [phone] 09/18/17, 3:00 PM

## 2017-09-18 NOTE — Progress Notes (Signed)
[facility] NAME: Marvin Lewis    MR#:  [id number]  DATE OF BIRTH:  Sep 25, 1951  SUBJECTIVE:   Pt. Here due to fall and noted to have a right Hip fracture. S/p Right hip intramedullary fixation postop day #2. Still having some right Hip pain and worked with PT today.  Asking for Dilaudid to help him sleep.   REVIEW OF SYSTEMS:    Review of Systems  Constitutional: Negative for chills and fever.  HENT: Negative for congestion and tinnitus.   Eyes: Negative for blurred vision and double vision.  Respiratory: Negative for cough, shortness of breath and wheezing.   Cardiovascular: Negative for chest pain, orthopnea and PND.  Gastrointestinal: Negative for abdominal pain, diarrhea, nausea and vomiting.  Genitourinary: Negative for dysuria and hematuria.  Musculoskeletal: Positive for falls and joint pain (Right Hip).  Neurological: Negative for dizziness, sensory change and focal weakness.  All other systems reviewed and are negative.   Nutrition: Heart healthy/Carb control Tolerating Diet: Yes Tolerating PT: Eval noted.  DRUG ALLERGIES:  No Known Allergies  VITALS:  Blood pressure 136/73, pulse 72, temperature 99.1 F (37.3 C), resp. rate 20, height 5\' 11"  (1.803 m), weight 108 kg (238 lb), SpO2 93 %.  PHYSICAL EXAMINATION:   Physical Exam  GENERAL:  66 y.o.-year-old patient lying in bed in no acute distress.  EYES: Pupils equal, round, reactive to light and accommodation. No scleral icterus. Extraocular muscles intact.  HEENT: Head atraumatic, normocephalic. Oropharynx and nasopharynx clear.  NECK:  Supple, no jugular venous distention. No thyroid enlargement, no tenderness.  LUNGS: Normal breath sounds bilaterally, no wheezing, rales, rhonchi. No use of accessory muscles of respiration.  CARDIOVASCULAR: S1, S2 normal. No murmurs, rubs, or gallops.  ABDOMEN: Soft, nontender, nondistended. Bowel sounds present. No  organomegaly or mass.  EXTREMITIES: No cyanosis, clubbing or edema b/l.   Right Hip dressing in place with no acute drainage.  NEUROLOGIC: Cranial nerves II through XII are intact. No focal Motor or sensory deficits b/l.  Globally weak.  PSYCHIATRIC: The patient is alert and oriented x 3.  SKIN: No obvious rash, lesion, or ulcer.    LABORATORY PANEL:   CBC  Recent Labs Lab 09/18/17 0349  WBC 13.0*  HGB 8.0*  HCT 26.9*  PLT 107*   ------------------------------------------------------------------------------------------------------------------  Chemistries   Recent Labs Lab 09/16/17 0836  09/18/17 0349  NA 140  < > 136  K 3.9  < > 4.3  CL 102  < > 98*  CO2 27  < > 31  GLUCOSE 200*  < > 202*  BUN 25*  < > 41*  CREATININE 0.96  < > 1.31*  CALCIUM 8.7*  < > 8.5*  AST 97*  --   --   ALT 106*  --   --   ALKPHOS 87  --   --   BILITOT 0.9  --   --   < > = values in this interval not displayed. ------------------------------------------------------------------------------------------------------------------  Cardiac Enzymes No results for input(s): TROPONINI in the last 168 hours. ------------------------------------------------------------------------------------------------------------------  RADIOLOGY:  Dg Hip Operative Unilat W Or W/o Pelvis Right  Result Date: 09/16/2017 CLINICAL DATA:  Right hip surgery. EXAM: OPERATIVE RIGHT HIP (WITH PELVIS IF PERFORMED) 23 VIEWS TECHNIQUE: Fluoroscopic spot image(s) were submitted for interpretation post-operatively. COMPARISON:  09/16/2017 FINDINGS: Patient status post ORIF right intertrochanteric hip fracture. Hardware intact. Anatomic alignment. Twenty-three images obtained. 1 minutes 22 seconds fluoroscopy time observed. IMPRESSION: ORIF  right intertrochanteric hip fracture. Electronically Signed   By: Marcello Moores  Register   On: 09/16/2017 14:44   Dg Femur, Min 2 Views Right  Result Date: 09/16/2017 CLINICAL DATA:  Status post  intramedullary nailing of the right femur for an intertrochanteric fracture. EXAM: RIGHT FEMUR 2 VIEWS COMPARISON:  Intraoperative fluoro spot radiographs of today's date FINDINGS: The patient has undergone ORIF for an intertrochanteric fracture. An intramedullary rod with telescoping screw is present. The securing screws in the lower femur are in reasonable position. The lesser trochanter remains displaced. IMPRESSION: No postprocedure complication following ORIF for an intertrochanteric fracture of the right hip. Electronically Signed   By: David  Martinique M.D.   On: 09/16/2017 16:02     ASSESSMENT AND PLAN:   66 year old male with past medical history of hypertension, hyperlipidemia, chronic atrial fibrillation, alcohol abuse, COPD, CHF who presents to the hospital after a mechanical fall and noted to have a right hip fracture.  1. Status post fall and right hip fracture-seen by orthopedics and status post right hip intramedullary fixation postop day 2 today. -Continue pain control with Oral Oxycodone, PRN dialudid, physical therapy as tolerated. Foley has been discontinued, Lovenox for DVT prophylaxis. -Likely discharged to short-term rehabilitation tomorrow.   2. Diabetes type 2 without complication-patient's blood sugars are somewhat elevated, will resume his long-acting insulin, continue sliding scale insulin. Follow blood sugars.  3. Essential hypertension-continue Norvasc, benazepril.  4. History of chronic atrial fibrillation-rate controlled. Continue sotalol. Patient is not on long-term coagulation due to history of alcohol abuse and falls.  5. COPD-no acute exacerbation-continue Dulera, DuoNeb nebs as needed.  6. GERD-continue Protonix.  7. Anemia of chronic disease-secondary to alcohol abuse. Hemoglobin currently stable and will cont. To monitor.   8. History of chronic systolic CHF-clinically patient is not in congestive heart failure. Continue sotalol, benazepril,  Lasix.  9. Tobacco abuse-continue nicotine patch.  10. ETOh abuse - high risk for ETOH withdrawal. Cont. CIWA.   Possible d/c to SNF tomorrow.   All the records are reviewed and case discussed with Care Management/Social Worker. Management plans discussed with the patient, family and they are in agreement.  CODE STATUS: Full code  DVT Prophylaxis: Lovenox  TOTAL TIME TAKING CARE OF THIS PATIENT: 30 minutes.   POSSIBLE D/C IN 1-2 DAYS, DEPENDING ON CLINICAL CONDITION.   Henreitta Leber M.D on 09/18/2017 at 2:27 PM  Between 7am to 6pm - Pager - [phone]  After 6pm go to [email or web address] - Proofreader  [facility]  Office  [phone]  CC: Primary care physician; Laverle Hobby, MD

## 2017-09-18 NOTE — Progress Notes (Signed)
Physical Therapy Treatment Patient Details Name: Marvin Lewis MRN: [id number] DOB: Mar 17, 1951 Today's Date: 09/18/2017    History of Present Illness Marvin Lewis  is a 66 y.o. male with a known history of chronic atrial fibrillation, alcohol abuse, COPD with ongoing tobacco abuse, history of coronary artery disease, CHF, attention, hyperlipidemia, depression who was recently admitted to the hospital due to COPD exacerbation and pneumonia now returns after a mechanical fall and noted to have a right hip fracture. Patient says he was up late at night watching TV and attempted to walk from his living room to his bedroom and he fell hit the door of his bedroom. Patient did not have any prodromal symptoms like chest pain palpitations dizziness nausea vomiting or any other associated symptoms. He was having significant pain in his right groin area and his right leg and therefore came to the ER for further evaluation. Patient CT head is negative for acute pathology but x-rays of his pelvis and right hip area showed a right-sided intertrochanteric fracture. Hospitalist services were contacted further treatment and evaluation. Ortho was consulted and pt underwent R hip ORIF with IM nail to repair the fracture. He is POD#1 at time of PT evaluation and is having considerable pain. Per patient he sufferred a L distal radius/ulna fracture 7 weeks ago with a closed reduction and casting at [facility]. Spoke with Dr. Mack Guise who confirmed pt can be WBAT on LUE as long as he is wearing his wrist splint and doesn't have pain.     PT Comments    Pt struggles significantly with sit to stand transfers from recliner back to bed. He is only able to perform squat pivot transfer with heavy verbal and tactile cues as well as maxA+2. Pt with minimal weight acceptance to RLE during transfer. He yells out in pain during transfer. He is able to complete seated exercises with therapist but also with  considerable increase in pain. Pt will benefit from PT services to address deficits in strength, balance, and mobility in order to return to full function at home.     Follow Up Recommendations  SNF     Equipment Recommendations  None recommended by PT    Recommendations for Other Services OT consult     Precautions / Restrictions Precautions Precautions: Fall Restrictions Weight Bearing Restrictions: Yes LUE Weight Bearing: Weight bearing as tolerated RLE Weight Bearing: Weight bearing as tolerated Other Position/Activity Restrictions: Per Krasinksi OK to WB on rolling walker with L wrist brace as long as it is pain-free. If he has pain will need to use a platform    Mobility  Bed Mobility Overal bed mobility: Needs Assistance Bed Mobility: Sit to Supine       Sit to supine: +2 for physical assistance;Max assist   General bed mobility comments: Pt requires maxA+2 for trunk and LE assist when returning to bed. Pt yells in pain with all bed mobility  Transfers Overall transfer level: Needs assistance Equipment used: Rolling walker (2 wheeled) Transfers: Sit to/from Stand Sit to Stand: +2 physical assistance;Max assist         General transfer comment: Pt struggles significantly with sit to stand transfers from recliner back to bed. He is only able to performs squat pivot transfer with heavy verbal and tactile cues as well as maxA+2. Pt with minimal weight acceptance to RLE during transfer. He yells out in pain during transfer  Ambulation/Gait             General  Gait Details: Unable   Stairs            Wheelchair Mobility    Modified Rankin (Stroke Patients Only)       Balance Overall balance assessment: Needs assistance Sitting-balance support: No upper extremity supported Sitting balance-Leahy Scale: Fair Sitting balance - Comments: Initially unstable but improves with extended time in sitting   Standing balance support: Bilateral upper  extremity supported Standing balance-Leahy Scale: Poor Standing balance comment: Unable to come to upright standing during PM session                            Cognition Arousal/Alertness: Awake/alert Behavior During Therapy: Restless Overall Cognitive Status: Within Functional Limits for tasks assessed                                        Exercises General Exercises - Lower Extremity Long Arc Quad: Strengthening;10 reps;Seated;Right Heel Slides: Strengthening;10 reps;Seated;Right Hip ABduction/ADduction: Strengthening;10 reps;Both;Seated Hip Flexion/Marching: Strengthening;10 reps;Seated;Both Heel Raises: Strengthening;Both;10 reps;Seated    General Comments        Pertinent Vitals/Pain Pain Assessment: 0-10 Pain Score: 8  Pain Location: R hip/groin Pain Descriptors / Indicators: Sharp Pain Intervention(s): Monitored during session    Home Living                      Prior Function            PT Goals (current goals can now be found in the care plan section) Acute Rehab PT Goals Patient Stated Goal: Return to prior function PT Goal Formulation: With patient/family Time For Goal Achievement: 10/01/17 Potential to Achieve Goals: Good Progress towards PT goals: Not progressing toward goals - comment (Limited significantly by pain)    Frequency    BID      PT Plan Current plan remains appropriate    Co-evaluation              AM-PAC PT "6 Clicks" Daily Activity  Outcome Measure  Difficulty turning over in bed (including adjusting bedclothes, sheets and blankets)?: Unable Difficulty moving from lying on back to sitting on the side of the bed? : Unable Difficulty sitting down on and standing up from a chair with arms (e.g., wheelchair, bedside commode, etc,.)?: Unable Help needed moving to and from a bed to chair (including a wheelchair)?: A Lot Help needed walking in hospital room?: Total Help needed climbing  3-5 steps with a railing? : Total 6 Click Score: 7    End of Session Equipment Utilized During Treatment: Gait belt;Oxygen;Other (comment) (2 L/min) Activity Tolerance: Patient limited by pain Patient left: with call bell/phone within reach;with family/visitor present;in bed;Other (comment);with SCD's reapplied (with OT)   PT Visit Diagnosis: Unsteadiness on feet (R26.81);Muscle weakness (generalized) (M62.81);History of falling (Z91.81);Difficulty in walking, not elsewhere classified (R26.2);Pain Pain - Right/Left: Right Pain - part of body: Hip     Time: 5397-6734 PT Time Calculation (min) (ACUTE ONLY): 24 min  Charges:  $Therapeutic Exercise: 8-22 mins $Therapeutic Activity: 8-22 mins                    G Codes:       Lyndel Safe Ashiyah Pavlak PT, DPT     Jermiyah Ricotta 09/18/2017, 2:36 PM

## 2017-09-19 LAB — BASIC METABOLIC PANEL
ANION GAP: 5 (ref 5–15)
BUN: 35 mg/dL — ABNORMAL HIGH (ref 6–20)
CO2: 32 mmol/L (ref 22–32)
Calcium: 8.6 mg/dL — ABNORMAL LOW (ref 8.9–10.3)
Chloride: 100 mmol/L — ABNORMAL LOW (ref 101–111)
Creatinine, Ser: 1.06 mg/dL (ref 0.61–1.24)
GFR calc non Af Amer: >60 mL/min (ref >60)
GLUCOSE: 154 mg/dL — AB (ref 65–99)
POTASSIUM: 4.5 mmol/L (ref 3.5–5.1)
Sodium: 137 mmol/L (ref 135–145)

## 2017-09-19 LAB — CBC
HEMATOCRIT: 28 % — AB (ref 40.0–52.0)
HEMOGLOBIN: 8.2 g/dL — AB (ref 13.0–18.0)
MCH: 22.6 pg — ABNORMAL LOW (ref 26.0–34.0)
MCHC: 29.1 g/dL — ABNORMAL LOW (ref 32.0–36.0)
MCV: 77.5 fL — AB (ref 80.0–100.0)
Platelets: 102 10*3/uL — ABNORMAL LOW (ref 150–440)
RBC: 3.62 MIL/uL — AB (ref 4.40–5.90)
RDW: 24.8 % — ABNORMAL HIGH (ref 11.5–14.5)
WBC: 11.3 10*3/uL — AB (ref 3.8–10.6)

## 2017-09-19 LAB — GLUCOSE, CAPILLARY
Glucose-Capillary: 119 mg/dL — ABNORMAL HIGH (ref 65–99)
Glucose-Capillary: 151 mg/dL — ABNORMAL HIGH (ref 65–99)
Glucose-Capillary: 215 mg/dL — ABNORMAL HIGH (ref 65–99)

## 2017-09-19 MED ORDER — ENOXAPARIN SODIUM 40 MG/0.4ML ~~LOC~~ SOLN: 40.0000 mg | SUBCUTANEOUS | Status: DC

## 2017-09-19 MED ORDER — OXYCODONE HCL 10 MG PO TABS: 10.0000 mg | ORAL_TABLET | ORAL | 0 refills | Status: DC | PRN

## 2017-09-19 NOTE — Clinical Social Work Placement (Signed)
   CLINICAL SOCIAL WORK PLACEMENT  NOTE  Date:  09/19/2017  Patient Details  Name: Marvin Lewis MRN: [id number] Date of Birth: October 20, 1951  Clinical Social Work is seeking post-discharge placement for this patient at the [place] level of care (*CSW will initial, date and re-position this form in  chart as items are completed):  Yes   Patient/family provided with [facility]'s list of facilities offering this level of care within the geographic area requested by the patient (or if unable, by the patient's family).  Yes   Patient/family informed of their freedom to choose among providers that offer the needed level of care, that participate in Medicare, Medicaid or managed care program needed by the patient, have an available bed and are willing to accept the patient.  Yes   Patient/family informed of [facility]'s ownership interest in [facility] and [facility], as well as of the fact that they are under no obligation to receive care at these facilities.  PASRR submitted to EDS on 09/17/17     PASRR number received on 09/18/17     Existing PASRR number confirmed on       FL2 transmitted to all facilities in geographic area requested by pt/family on 09/17/17     FL2 transmitted to all facilities within larger geographic area on       Patient informed that his/her managed care company has contracts with or will negotiate with certain facilities, including the following:        Yes   Patient/family informed of bed offers received.  Patient chooses bed at  ([facility] )     Physician recommends and patient chooses bed at      Patient to be transferred to  ([facility] ) on 09/19/17.  Patient to be transferred to facility by  [facility] )     Patient family notified on 09/19/17 of transfer.  Name of family member notified:   (Patient's wife [name] is at bedside and aware of D/C today. )     PHYSICIAN        Additional Comment:    _______________________________________________ Phila Shoaf, Veronia Beets, LCSW 09/19/2017, 11:14 AM

## 2017-09-19 NOTE — Progress Notes (Signed)
Called EMS for patient transport to [facility].

## 2017-09-19 NOTE — Discharge Summary (Signed)
[facility] NAME: Marvin Lewis    MR#:  [id number]  DATE OF BIRTH:  06-25-1951  DATE OF ADMISSION:  09/16/2017 ADMITTING PHYSICIAN: Henreitta Leber, MD  DATE OF DISCHARGE: 09/19/2017  PRIMARY CARE PHYSICIAN: Laverle Hobby, MD    ADMISSION DIAGNOSIS:  Closed fracture of right hip, initial encounter ([place]) [S72.001A]  DISCHARGE DIAGNOSIS:  Active Problems:   Closed right hip fracture ([place])   SECONDARY DIAGNOSIS:   Past Medical History:  Diagnosis Date  . A-fib ([place])   . Alcohol abuse   . Alcohol use   . Aortic aneurysm ([place]) 06/19/2017   Ascending 4.4 cm, CT scan July 2018  . Aortic aneurysm, intrathoracic ([place])   . Ascending aortic aneurysm ([place])   . Ascending aortic aneurysm ([place])   . Atrial fibrillation ([place]) 05/08/2016  . Broken rib April 2016   History of broken ribs x 3  . Bronchitis   . CAD (coronary artery disease)   . CHF (congestive heart failure) ([place])   . Chronic alcoholism ([place])   . Cirrhosis ([place])   . Closed nondisplaced fracture of fifth metatarsal bone of left foot Nov. 2016   left  . COPD (chronic obstructive pulmonary disease) ([place])   . Depression   . Diabetes mellitus without complication ([place])   . Emphysema of lung ([place])   . Esophageal rupture   . GERD (gastroesophageal reflux disease)   . Hepatitis C   . Hip arthritis   . History of diverticulitis    ruptured and sepsis  . History of hiatal hernia   . History of smoking   . HOH (hard of hearing)   . Hyperlipidemia   . Hypertension   . Hypoxemia   . Insomnia   . Movement disorder   . Onychogryphosis   . Pneumonia   . Rib fracture   . Seasonal allergies   . Shortness of breath dyspnea   . Sleep apnea    uses O2 @2  L/min at night  . Stroke ([place])   . Thrombocytopenia [facility])     HOSPITAL COURSE:   66 year old male with past medical history of hypertension, hyperlipidemia, chronic atrial fibrillation, alcohol abuse, COPD,  CHF who presents to the hospital after a mechanical fall and noted to have a right hip fracture.  1. Status post fall and right hip fracture-seen by orthopedics and status post right hip intramedullary fixation postop day 3 today. - pt. Working with PT well and pain is controlled with Oral Oxycodone.   - will d/c to SNF/STR and follow up with Orthopedics in 2 weeks. DVT prophylaxis with Lovenox for next 14 days.   2. Diabetes type 2 without complication- pt. Has no episodes of hypoglycemia. Diabetes coordinator was consulted while in the hospital and pt. Will resume his Tresiba insulin at bedtime.    3. Essential hypertension- he will continue Norvasc, benazepril.  4. History of chronic atrial fibrillation-rate controlled. He will Continue sotalol. Patient is not on long-term coagulation due to history of alcohol abuse and falls.  5. COPD-no acute exacerbation while in the hospital.  - pt. Will resume his Advair and PRN duonebs.   6. GERD- pt. Will continue Omeprazole. .  7. Anemia of chronic disease-secondary to alcohol abuse. Hg. Remained stable and pt. Did not require transfusions.    8. History of chronic systolic CHF-clinically patient was not in congestive heart failure while in the hospital.  - he will continue sotalol, benazepril, Lasix.  9. Tobacco abuse- he will continue nicotine patch.  10. ETOh abuse - pt. Was on CIWA protocol while in the hospital but did not require any meds and showed no evidence of DT's or ETOH withdrawal while in the hospital.   DISCHARGE CONDITIONS:   Stable.   CONSULTS OBTAINED:  Treatment Team:  Thornton Park, MD  DRUG ALLERGIES:  No Known Allergies  DISCHARGE MEDICATIONS:   Allergies as of 09/19/2017   No Known Allergies     Medication List    STOP taking these medications   albuterol 108 (90 Base) MCG/ACT inhaler Commonly known as:  PROVENTIL HFA;VENTOLIN HFA   amoxicillin-clavulanate 500-125 MG tablet Commonly known  as:  AUGMENTIN   guaiFENesin-dextromethorphan 100-10 MG/5ML syrup Commonly known as:  ROBITUSSIN DM     TAKE these medications   amLODipine 5 MG tablet Commonly known as:  NORVASC Take 5 mg by mouth daily.   atorvastatin 10 MG tablet Commonly known as:  LIPITOR Take 1 tablet (10 mg total) by mouth at bedtime.   B-D INS SYRINGE 0.5CC/30GX1/2" 30G X 1/2" 0.5 ML Misc Generic drug:  Insulin Syringe-Needle U-100 AS DIRECTED.   benazepril 10 MG tablet Commonly known as:  LOTENSIN Take 10 mg by mouth daily.   docusate sodium 100 MG capsule Commonly known as:  COLACE Take 1 capsule (100 mg total) by mouth 2 (two) times daily. What changed:  when to take this   enoxaparin 40 MG/0.4ML injection Commonly known as:  LOVENOX Inject 0.4 mLs (40 mg total) into the skin daily.   ferrous sulfate 325 (65 FE) MG tablet Take 1 tablet (325 mg total) by mouth 2 (two) times daily with a meal.   fluticasone-salmeterol 115-21 MCG/ACT inhaler Commonly known as:  ADVAIR HFA Inhale 2 puffs into the lungs every 12 (twelve) hours.   folic acid 1 MG tablet Commonly known as:  FOLVITE Take 1 tablet (1 mg total) by mouth daily.   furosemide 20 MG tablet Commonly known as:  LASIX Take 2 tablets (40 mg total) by mouth daily.   insulin degludec 100 UNIT/ML Sopn FlexTouch Pen Commonly known as:  TRESIBA Inject 48 Units into the skin at bedtime.   ipratropium-albuterol 0.5-2.5 (3) MG/3ML Soln Commonly known as:  DUONEB Take 3 mLs by nebulization every 6 (six) hours as needed. For shortness of breath/wheezing   nicotine 21 mg/24hr patch Commonly known as:  NICODERM CQ - dosed in mg/24 hours Place 1 patch (21 mg total) onto the skin daily.   omeprazole 20 MG capsule Commonly known as:  PRILOSEC Take 20 mg by mouth daily.   Oxycodone HCl 10 MG Tabs Take 1 tablet (10 mg total) by mouth every 3 (three) hours as needed for breakthrough pain ((for MODERATE breakthrough pain)).   sotalol 80 MG  tablet Commonly known as:  BETAPACE Take 1 tablet (80 mg total) by mouth 2 (two) times daily.   zolpidem 10 MG tablet Commonly known as:  AMBIEN Take 10 mg by mouth at bedtime as needed for sleep.         DISCHARGE INSTRUCTIONS:   DIET:  Cardiac diet and Diabetic diet  DISCHARGE CONDITION:  Stable  ACTIVITY:  Activity as tolerated  OXYGEN:  Home Oxygen: Yes.     Oxygen Delivery: 3 liters/min via Patient connected to nasal cannula oxygen  DISCHARGE LOCATION:  nursing home   If you experience worsening of your admission symptoms, develop shortness of breath, life threatening emergency, suicidal or homicidal thoughts you must  seek medical attention immediately by calling 911 or calling your MD immediately  if symptoms less severe.  You Must read complete instructions/literature along with all the possible adverse reactions/side effects for all the Medicines you take and that have been prescribed to you. Take any new Medicines after you have completely understood and accpet all the possible adverse reactions/side effects.   Please note  You were cared for by a hospitalist during your hospital stay. If you have any questions about your discharge medications or the care you received while you were in the hospital after you are discharged, you can call the unit and asked to speak with the hospitalist on call if the hospitalist that took care of you is not available. Once you are discharged, your primary care physician will handle any further medical issues. Please note that NO REFILLS for any discharge medications will be authorized once you are discharged, as it is imperative that you return to your primary care physician (or establish a relationship with a primary care physician if you do not have one) for your aftercare needs so that they can reassess your need for medications and monitor your lab values.     Today   Still having some pain in the right Hip. NO acute events  overnight.  Had a BM yesterday. Tolerating PT well and pain well controlled on Oral meds. Will d/c to sNF today.   VITAL SIGNS:  Blood pressure 132/75, pulse 83, temperature 98.4 F (36.9 C), temperature source Oral, resp. rate 18, height 5\' 11"  (1.803 m), weight 108 kg (238 lb), SpO2 93 %.  I/O:   Intake/Output Summary (Last 24 hours) at 09/19/17 1032 Last data filed at 09/19/17 0605  Gross per 24 hour  Intake              240 ml  Output             1150 ml  Net             -910 ml    PHYSICAL EXAMINATION:   GENERAL:  66 y.o.-year-old patient lying in bed in no acute distress.  EYES: Pupils equal, round, reactive to light and accommodation. No scleral icterus. Extraocular muscles intact.  HEENT: Head atraumatic, normocephalic. Oropharynx and nasopharynx clear.  NECK:  Supple, no jugular venous distention. No thyroid enlargement, no tenderness.  LUNGS: Normal breath sounds bilaterally, no wheezing, rales, rhonchi. No use of accessory muscles of respiration.  CARDIOVASCULAR: S1, S2 normal. No murmurs, rubs, or gallops.  ABDOMEN: Soft, nontender, nondistended. Bowel sounds present. No organomegaly or mass.  EXTREMITIES: No cyanosis, clubbing or edema b/l.   Right Hip dressing in place with no acute drainage.  NEUROLOGIC: Cranial nerves II through XII are intact. No focal Motor or sensory deficits b/l.   PSYCHIATRIC: The patient is alert and oriented x 3.  SKIN: No obvious rash, lesion, or ulcer.   DATA REVIEW:   CBC  Recent Labs Lab 09/19/17 0347  WBC 11.3*  HGB 8.2*  HCT 28.0*  PLT 102*    Chemistries   Recent Labs Lab 09/16/17 0836  09/19/17 0347  NA 140  < > 137  K 3.9  < > 4.5  CL 102  < > 100*  CO2 27  < > 32  GLUCOSE 200*  < > 154*  BUN 25*  < > 35*  CREATININE 0.96  < > 1.06  CALCIUM 8.7*  < > 8.6*  AST 97*  --   --  ALT 106*  --   --   ALKPHOS 87  --   --   BILITOT 0.9  --   --   < > = values in this interval not displayed.  Cardiac Enzymes No  results for input(s): TROPONINI in the last 168 hours.  Microbiology Results  Results for orders placed or performed during the hospital encounter of 09/03/17  Culture, blood (routine x 2)     Status: None   Collection Time: 09/03/17  8:11 AM  Result Value Ref Range Status   Specimen Description BLOOD LEFT AC  Final   Special Requests   Final    BOTTLES DRAWN AEROBIC AND ANAEROBIC Blood Culture results may not be optimal due to an excessive volume of blood received in culture bottles   Culture NO GROWTH 5 DAYS  Final   Report Status 09/08/2017 FINAL  Final  Culture, blood (routine x 2)     Status: None   Collection Time: 09/03/17  8:16 AM  Result Value Ref Range Status   Specimen Description BLOOD RIGHT HAND  Final   Special Requests   Final    BOTTLES DRAWN AEROBIC AND ANAEROBIC Blood Culture adequate volume   Culture NO GROWTH 5 DAYS  Final   Report Status 09/08/2017 FINAL  Final  MRSA PCR Screening     Status: None   Collection Time: 09/03/17 11:24 AM  Result Value Ref Range Status   MRSA by PCR NEGATIVE NEGATIVE Final    Comment:        The GeneXpert MRSA Assay (FDA approved for NASAL specimens only), is one component of a comprehensive MRSA colonization surveillance program. It is not intended to diagnose MRSA infection nor to guide or monitor treatment for MRSA infections.     RADIOLOGY:  No results found.    Management plans discussed with the patient, family and they are in agreement.  CODE STATUS:     Code Status Orders        Start     Ordered   09/16/17 1710  Full code  Continuous     09/16/17 1709  Advance Directive Documentation     Most Recent Value  Type of Advance Directive  Healthcare Power of Attorney, Living will  Pre-existing out of facility DNR order (yellow form or pink MOST form)  -  "MOST" Form in Place?  -      TOTAL TIME TAKING CARE OF THIS PATIENT: 40 minutes.    Henreitta Leber M.D on 09/19/2017 at 10:32 AM  Between 7am to  6pm - Pager - [phone]  After 6pm go to [email or web address] - Proofreader  [facility]  Office  [phone]  CC: Primary care physician; Laverle Hobby, MD

## 2017-09-19 NOTE — Progress Notes (Signed)
EMS here to transport patient

## 2017-09-19 NOTE — Progress Notes (Signed)
Physical Therapy Treatment Patient Details Name: Marvin Lewis MRN: [id number] DOB: Jun 23, 1951 Today's Date: 09/19/2017    History of Present Illness Marvin Lewis  is a 66 y.o. male with a known history of chronic atrial fibrillation, alcohol abuse, COPD with ongoing tobacco abuse, history of coronary artery disease, CHF, attention, hyperlipidemia, depression who was recently admitted to the hospital due to COPD exacerbation and pneumonia now returns after a mechanical fall and noted to have a right hip fracture. Patient says he was up late at night watching TV and attempted to walk from his living room to his bedroom and he fell hit the door of his bedroom. Patient did not have any prodromal symptoms like chest pain palpitations dizziness nausea vomiting or any other associated symptoms. He was having significant pain in his right groin area and his right leg and therefore came to the ER for further evaluation. Patient CT head is negative for acute pathology but x-rays of his pelvis and right hip area showed a right-sided intertrochanteric fracture. Hospitalist services were contacted further treatment and evaluation. Ortho was consulted and pt underwent R hip ORIF with IM nail to repair the fracture. Per patient he suffered a L distal radius/ulna fracture 7 weeks ago with a closed reduction and casting at [facility]. Spoke with Dr. Mack Guise who confirmed pt can be WBAT on LUE as long as he is wearing his wrist splint and doesn't have pain.     PT Comments    Pt continues to struggle with exercises and mobility secondary to pain. He requires maxA+2 for trunk and LE assist when returning to bed. Pt yells in pain with all bed mobility. Increased time required and once upright at EOB pt continually leans to the left to keep pressure off his right hip. He really struggled yesterday with transfers so utilized sit to stand lift today to practice WB through RLE in standing. Utilized the  [place] and once in standing pt practiced lateral weight shifting for 1-2 minutes. Pt reporting significant pain and unable to tolerate standing for longer. Unable to attempt ambulation at this time. He will need SNF placement at discharge. Pt will benefit from PT services to address deficits in strength, balance, and mobility in order to return to full function at home.    Follow Up Recommendations  SNF     Equipment Recommendations  None recommended by PT    Recommendations for Other Services OT consult     Precautions / Restrictions Precautions Precautions: Fall Restrictions Weight Bearing Restrictions: No LUE Weight Bearing: Weight bearing as tolerated RLE Weight Bearing: Weight bearing as tolerated Other Position/Activity Restrictions: Per [place] OK to WB on rolling walker with L wrist brace as long as it is pain-free. If he has pain will need to use a platform    Mobility  Bed Mobility Overal bed mobility: Needs Assistance Bed Mobility: Sit to Supine     Supine to sit: Max assist;+2 for physical assistance Sit to supine: +2 for physical assistance;Max assist   General bed mobility comments: Pt requires maxA+2 for trunk and LE assist when returning to bed. Pt yells in pain with all bed mobility. Increased time required and once upright at EOB pt continually leans to the left to keep pressure off his right hip  Transfers Overall transfer level: Needs assistance Equipment used: Rolling walker (2 wheeled) Transfers: Sit to/from Stand Sit to Stand: +2 physical assistance;Max assist         General transfer comment: Pt really  struggled yesterday with transfers so utilized sit to stand lift today to practice WB through RLE in standing. Utilized the [place] and once in standing pt practiced lateral weight shifting for 1-2 minutes. Pt reporting significant pain and unable to tolerate standing for longer. Unable to attempt ambulation at this time  Ambulation/Gait              General Gait Details: Unable   Stairs            Wheelchair Mobility    Modified Rankin (Stroke Patients Only)       Balance Overall balance assessment: Needs assistance Sitting-balance support: No upper extremity supported Sitting balance-Leahy Scale: Fair Sitting balance - Comments: Initially unstable but improves with extended time in sitting. Pt leans heavily to the L to avoid pressure on right hip   Standing balance support: Bilateral upper extremity supported Standing balance-Leahy Scale: Zero Standing balance comment: Unable to stand at this time                            Cognition Arousal/Alertness: Awake/alert Behavior During Therapy: Restless Overall Cognitive Status: Within Functional Limits for tasks assessed                                        Exercises General Exercises - Lower Extremity Ankle Circles/Pumps: AROM;Both;10 reps;Supine Quad Sets: Strengthening;Both;10 reps;Supine Gluteal Sets: Strengthening;Both;10 reps;Supine Short Arc Quad: Strengthening;Right;10 reps;Supine Heel Slides: Strengthening;10 reps;Right;Supine Hip ABduction/ADduction: Strengthening;10 reps;Both;Supine Straight Leg Raises: Strengthening;Right;10 reps;Supine    General Comments        Pertinent Vitals/Pain Pain Assessment: 0-10 Pain Score: 9  Pain Location: R hip/groin Pain Descriptors / Indicators: Sharp Pain Intervention(s): Premedicated before session;Monitored during session    Home Living                      Prior Function            PT Goals (current goals can now be found in the care plan section) Acute Rehab PT Goals Patient Stated Goal: Return to prior function PT Goal Formulation: With patient/family Time For Goal Achievement: 10/01/17 Potential to Achieve Goals: Good Progress towards PT goals: Not progressing toward goals - comment (Significantly limited by pain)    Frequency    BID      PT  Plan Current plan remains appropriate    Co-evaluation              AM-PAC PT "6 Clicks" Daily Activity  Outcome Measure  Difficulty turning over in bed (including adjusting bedclothes, sheets and blankets)?: Unable Difficulty moving from lying on back to sitting on the side of the bed? : Unable Difficulty sitting down on and standing up from a chair with arms (e.g., wheelchair, bedside commode, etc,.)?: Unable Help needed moving to and from a bed to chair (including a wheelchair)?: A Lot Help needed walking in hospital room?: Total Help needed climbing 3-5 steps with a railing? : Total 6 Click Score: 7    End of Session Equipment Utilized During Treatment: Gait belt;Oxygen;Other (comment) (2 L/min) Activity Tolerance: Patient limited by pain Patient left: with call bell/phone within reach;with family/visitor present;in chair;with chair alarm set Nurse Communication: Mobility status;Other (comment) (CNA agrees to use lift to return back to bed) PT Visit Diagnosis: Unsteadiness on feet (R26.81);Muscle weakness (generalized) (M62.81);History of falling (  Z91.81);Difficulty in walking, not elsewhere classified (R26.2);Pain Pain - Right/Left: Right Pain - part of body: Hip     Time: 1033-1100 PT Time Calculation (min) (ACUTE ONLY): 27 min  Charges:  $Therapeutic Exercise: 8-22 mins $Therapeutic Activity: 8-22 mins                    G Codes:       Lyndel Safe Huprich PT, DPT     Huprich,Jason 09/19/2017, 12:02 PM

## 2017-09-19 NOTE — Progress Notes (Signed)
Patient is being discharged to [facility] and report given to [facility]. Patient prepared for transport via EMS. IV removed. AVS printed and given to patient.

## 2017-09-19 NOTE — Progress Notes (Signed)
Subjective:  POD #3 s/p IM fixation for right hip fracture.   Patient reports pain as mild to moderate.  Patient pain improving.   Making progress with PT.  Tolerating a PO diet.  Objective:   VITALS:   Vitals:   09/18/17 0740 09/18/17 2012 09/18/17 2033 09/19/17 0752  BP: 136/73  115/62 132/75  Pulse: 72  77 83  Resp: 20   18  Temp: 98.8 F (37.1 C)   98.4 F (36.9 C)  TempSrc: Oral   Oral  SpO2: 93% 90% 91% 93%  Weight:      Height:        PHYSICAL EXAM: Right lower extremity: Neurovascular intact Sensation intact distally Intact pulses distally Dorsiflexion/Plantar flexion intact Incision: scant drainage No cellulitis present Compartment soft  LABS  Results for orders placed or performed during the hospital encounter of 09/16/17 (from the past 24 hour(s))  Glucose, capillary     Status: Abnormal   Collection Time: 09/18/17  5:11 PM  Result Value Ref Range   Glucose-Capillary 175 (H) 65 - 99 mg/dL  Glucose, capillary     Status: Abnormal   Collection Time: 09/18/17 10:14 PM  Result Value Ref Range   Glucose-Capillary 216 (H) 65 - 99 mg/dL   Comment 1 Notify RN   Glucose, capillary     Status: Abnormal   Collection Time: 09/19/17 12:02 AM  Result Value Ref Range   Glucose-Capillary 215 (H) 65 - 99 mg/dL   Comment 1 Notify RN   CBC     Status: Abnormal   Collection Time: 09/19/17  3:47 AM  Result Value Ref Range   WBC 11.3 (H) 3.8 - 10.6 K/uL   RBC 3.62 (L) 4.40 - 5.90 MIL/uL   Hemoglobin 8.2 (L) 13.0 - 18.0 g/dL   HCT 28.0 (L) 40.0 - 52.0 %   MCV 77.5 (L) 80.0 - 100.0 fL   MCH 22.6 (L) 26.0 - 34.0 pg   MCHC 29.1 (L) 32.0 - 36.0 g/dL   RDW 24.8 (H) 11.5 - 14.5 %   Platelets 102 (L) 150 - 440 K/uL  Basic metabolic panel     Status: Abnormal   Collection Time: 09/19/17  3:47 AM  Result Value Ref Range   Sodium 137 135 - 145 mmol/L   Potassium 4.5 3.5 - 5.1 mmol/L   Chloride 100 (L) 101 - 111 mmol/L   CO2 32 22 - 32 mmol/L   Glucose, Bld 154 (H) 65 -  99 mg/dL   BUN 35 (H) 6 - 20 mg/dL   Creatinine, Ser 1.06 0.61 - 1.24 mg/dL   Calcium 8.6 (L) 8.9 - 10.3 mg/dL   GFR calc non Af Amer >60 >60 mL/min   GFR calc Af Amer >60 >60 mL/min   Anion gap 5 5 - 15  Glucose, capillary     Status: Abnormal   Collection Time: 09/19/17  7:53 AM  Result Value Ref Range   Glucose-Capillary 151 (H) 65 - 99 mg/dL  Glucose, capillary     Status: Abnormal   Collection Time: 09/19/17 11:36 AM  Result Value Ref Range   Glucose-Capillary 119 (H) 65 - 99 mg/dL    No results found.  Assessment/Plan: 3 Days Post-Op   Active Problems:   Closed right hip fracture [facility])  Patient will be discharged to SNF today.   Continue PT.   Patient is WBAT on right LE.  Follow up in approximately 10 days for follow up in our office.  Continue  lovenox 40 mg SQ daily for 4 weeks.      Thornton Park , MD 09/19/2017, 2:01 PM

## 2017-09-19 NOTE — Progress Notes (Addendum)
Patient is medically stable for D/C to [facility] today. Per [facility] liaison patient can come today to room 804. RN will call report to RN Yaakov Guthrie at [phone] and arrange EMS for transport. Clinical Education officer, museum (CSW) sent D/C orders to [facility] via [facility]. Patient is aware of above. Patient's wife [name] is at bedside and aware of above. Medicare bundle case manager aware of above. Please reconsult if future social work needs arise. CSW signing off.   [employer], LCSW [phone]

## 2017-09-19 NOTE — Progress Notes (Signed)
Inpatient Diabetes Program Recommendations  AACE/ADA: New Consensus Statement on Inpatient Glycemic Control (2015)  Target Ranges:  Prepandial:   less than 140 mg/dL      [facility] postprandial:   less than 180 mg/dL (1-2 hours)      Critically ill patients:  140 - 180 mg/dL   Lab Results  Component Value Date   GLUCAP 151 (H) 09/19/2017   HGBA1C 9.9 (H) 04/23/2016    Review of Glycemic Control  Results for SHYQUAN, STALLBAUMER (MRN [id number]) as of 09/19/2017 09:08  Ref. Range 09/18/2017 11:43 09/18/2017 17:11 09/18/2017 22:14 09/19/2017 00:02 09/19/2017 07:53  Glucose-Capillary Latest Ref Range: 65 - 99 mg/dL 220 (H) 175 (H) 216 (H) 215 (H) 151 (H)    Diabetes history:DM 2 Outpatient Diabetes medications: Tresiba 48 units QHS Current orders for Inpatient glycemic control: Lantus 48 units QHS, Novolog Sensitive 0-9 units tid + hs  Inpatient Diabetes Program Recommendations: If patient is eating over 50 %, consider adding Novolog 2 units tid with meals and continue Novolog correction as ordered.   If he is consistently not eating his meals- consider changing Novolog correction to moderate correction scale 0-15 units tid, and add Novolog 0-5 units qhs  Gentry Fitz, RN, [place], [place], CDE Diabetes Coordinator Inpatient Diabetes Program  [phone] (Team Pager) [phone] ([place]) 09/19/2017 9:10 AM

## 2017-09-19 NOTE — Care Management Important Message (Signed)
Important Message  Patient Details  Name: Marvin Lewis MRN: [id number] Date of Birth: [birth year]   Medicare Important Message Given:  Yes    Katrina Stack, RN 09/19/2017, 2:31 PM

## 2017-09-23 DIAGNOSIS — S7291XD Unspecified fracture of right femur, subsequent encounter for closed fracture with routine healing: Secondary | ICD-10-CM | POA: Diagnosis not present

## 2017-09-23 DIAGNOSIS — Z79891 Long term (current) use of opiate analgesic: Secondary | ICD-10-CM | POA: Diagnosis not present

## 2017-09-23 DIAGNOSIS — I4891 Unspecified atrial fibrillation: Secondary | ICD-10-CM | POA: Diagnosis not present

## 2017-09-23 DIAGNOSIS — I509 Heart failure, unspecified: Secondary | ICD-10-CM | POA: Diagnosis not present

## 2017-09-23 DIAGNOSIS — E119 Type 2 diabetes mellitus without complications: Secondary | ICD-10-CM | POA: Diagnosis not present

## 2017-09-23 DIAGNOSIS — E785 Hyperlipidemia, unspecified: Secondary | ICD-10-CM | POA: Diagnosis not present

## 2017-09-23 DIAGNOSIS — J449 Chronic obstructive pulmonary disease, unspecified: Secondary | ICD-10-CM | POA: Diagnosis not present

## 2017-09-23 DIAGNOSIS — I2510 Atherosclerotic heart disease of native coronary artery without angina pectoris: Secondary | ICD-10-CM | POA: Diagnosis not present

## 2017-09-24 ENCOUNTER — Other Ambulatory Visit
Admission: RE | Admit: 2017-09-24 | Discharge: 2017-09-24 | Disposition: A | Discharge status: Home / Self Care | Payer: Medicare Other | Source: Skilled Nursing Facility | Attending: Family Medicine | Admitting: Family Medicine

## 2017-09-24 DIAGNOSIS — I509 Heart failure, unspecified: Secondary | ICD-10-CM | POA: Diagnosis not present

## 2017-09-24 DIAGNOSIS — D649 Anemia, unspecified: Secondary | ICD-10-CM | POA: Insufficient documentation

## 2017-09-24 LAB — BRAIN NATRIURETIC PEPTIDE: B NATRIURETIC PEPTIDE 5: 1156 pg/mL — AB (ref 0.0–100.0)

## 2017-10-01 DIAGNOSIS — S52532A Colles' fracture of left radius, initial encounter for closed fracture: Secondary | ICD-10-CM | POA: Diagnosis not present

## 2017-10-06 ENCOUNTER — Other Ambulatory Visit
Admission: RE | Admit: 2017-10-06 | Discharge: 2017-10-06 | Disposition: A | Discharge status: Home / Self Care | Payer: Medicare Other | Source: Ambulatory Visit | Attending: Family Medicine | Admitting: Family Medicine

## 2017-10-06 DIAGNOSIS — I509 Heart failure, unspecified: Secondary | ICD-10-CM | POA: Diagnosis not present

## 2017-10-06 DIAGNOSIS — D649 Anemia, unspecified: Secondary | ICD-10-CM | POA: Insufficient documentation

## 2017-10-06 LAB — CBC WITH DIFFERENTIAL/PLATELET
Basophils Absolute: 0.1 10*3/uL (ref 0–0.1)
Basophils Relative: 1 %
EOS ABS: 0.3 10*3/uL (ref 0–0.7)
Eosinophils Relative: 4 %
HCT: 34 % — ABNORMAL LOW (ref 40.0–52.0)
Hemoglobin: 9.9 g/dL — ABNORMAL LOW (ref 13.0–18.0)
Lymphocytes Relative: 11 %
Lymphs Abs: 0.7 10*3/uL — ABNORMAL LOW (ref 1.0–3.6)
MCH: 25.9 pg — AB (ref 26.0–34.0)
MCHC: 29 g/dL — AB (ref 32.0–36.0)
MCV: 89.4 fL (ref 80.0–100.0)
MONO ABS: 0.8 10*3/uL (ref 0.2–1.0)
Monocytes Relative: 11 %
Neutro Abs: 5 10*3/uL (ref 1.4–6.5)
Neutrophils Relative %: 73 %
Platelets: 129 10*3/uL — ABNORMAL LOW (ref 150–440)
RBC: 3.81 MIL/uL — ABNORMAL LOW (ref 4.40–5.90)
RDW: 26.8 % — AB (ref 11.5–14.5)
WBC: 6.8 10*3/uL (ref 3.8–10.6)

## 2017-10-06 LAB — BRAIN NATRIURETIC PEPTIDE: B NATRIURETIC PEPTIDE 5: 506 pg/mL — AB (ref 0.0–100.0)

## 2017-10-16 DIAGNOSIS — M25551 Pain in right hip: Secondary | ICD-10-CM | POA: Diagnosis not present

## 2017-10-16 DIAGNOSIS — M25659 Stiffness of unspecified hip, not elsewhere classified: Secondary | ICD-10-CM | POA: Diagnosis not present

## 2017-10-20 ENCOUNTER — Ambulatory Visit: Payer: Medicare Other

## 2017-10-20 ENCOUNTER — Inpatient Hospital Stay: Payer: Medicare Other | Admitting: Family Medicine

## 2017-10-21 DIAGNOSIS — M25551 Pain in right hip: Secondary | ICD-10-CM | POA: Diagnosis not present

## 2017-10-21 DIAGNOSIS — M25651 Stiffness of right hip, not elsewhere classified: Secondary | ICD-10-CM | POA: Diagnosis not present

## 2017-10-22 DIAGNOSIS — J3800 Paralysis of vocal cords and larynx, unspecified: Secondary | ICD-10-CM | POA: Diagnosis not present

## 2017-10-22 DIAGNOSIS — J431 Panlobular emphysema: Secondary | ICD-10-CM | POA: Diagnosis not present

## 2017-10-22 DIAGNOSIS — R918 Other nonspecific abnormal finding of lung field: Secondary | ICD-10-CM | POA: Diagnosis not present

## 2017-10-22 DIAGNOSIS — R05 Cough: Secondary | ICD-10-CM | POA: Diagnosis not present

## 2017-10-24 DIAGNOSIS — M25651 Stiffness of right hip, not elsewhere classified: Secondary | ICD-10-CM | POA: Diagnosis not present

## 2017-10-24 DIAGNOSIS — M25551 Pain in right hip: Secondary | ICD-10-CM | POA: Diagnosis not present

## 2017-11-03 DIAGNOSIS — M25651 Stiffness of right hip, not elsewhere classified: Secondary | ICD-10-CM | POA: Diagnosis not present

## 2017-11-03 DIAGNOSIS — M25551 Pain in right hip: Secondary | ICD-10-CM | POA: Diagnosis not present

## 2017-11-05 DIAGNOSIS — E1159 Type 2 diabetes mellitus with other circulatory complications: Secondary | ICD-10-CM | POA: Diagnosis not present

## 2017-11-05 DIAGNOSIS — J431 Panlobular emphysema: Secondary | ICD-10-CM | POA: Diagnosis not present

## 2017-11-05 DIAGNOSIS — I481 Persistent atrial fibrillation: Secondary | ICD-10-CM | POA: Diagnosis not present

## 2017-11-05 DIAGNOSIS — I10 Essential (primary) hypertension: Secondary | ICD-10-CM | POA: Diagnosis not present

## 2017-11-05 DIAGNOSIS — S52532A Colles' fracture of left radius, initial encounter for closed fracture: Secondary | ICD-10-CM | POA: Diagnosis not present

## 2017-11-05 DIAGNOSIS — E1169 Type 2 diabetes mellitus with other specified complication: Secondary | ICD-10-CM | POA: Diagnosis not present

## 2017-11-05 DIAGNOSIS — D500 Iron deficiency anemia secondary to blood loss (chronic): Secondary | ICD-10-CM | POA: Diagnosis not present

## 2017-11-05 DIAGNOSIS — I739 Peripheral vascular disease, unspecified: Secondary | ICD-10-CM | POA: Diagnosis not present

## 2017-11-05 DIAGNOSIS — E785 Hyperlipidemia, unspecified: Secondary | ICD-10-CM | POA: Diagnosis not present

## 2017-12-03 IMAGING — CR DG FINGER INDEX 2+V*R*
1 series · 3 of 3 positions shown · non-contrast
Comparison: None.

CLINICAL DATA: Pain in the right second digit for 4 months, no
injury

EXAM:
RIGHT INDEX FINGER 2+V

[Series 1: dg finger index right · 0.14mm/px · 3 of 3 slices shown]
[im 1/3]
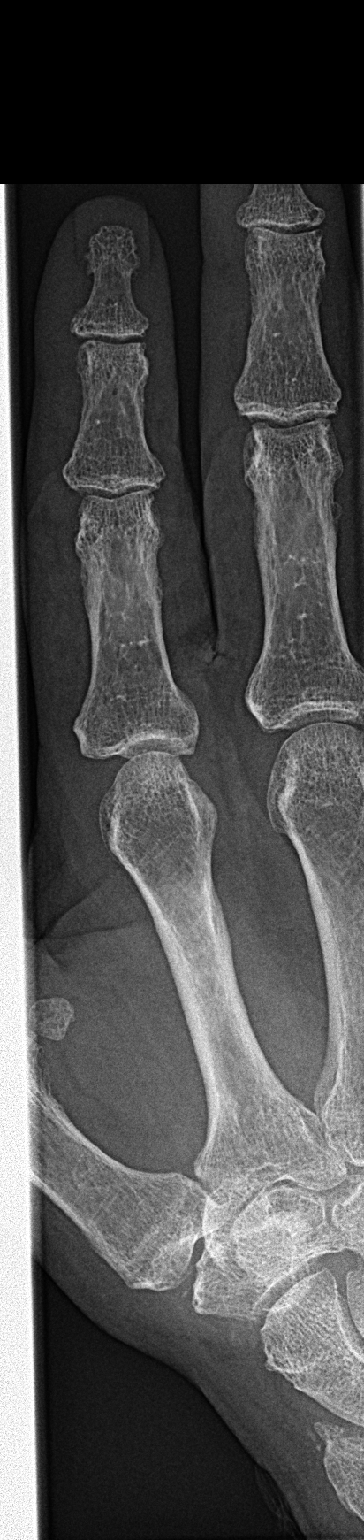
[im 2/3]
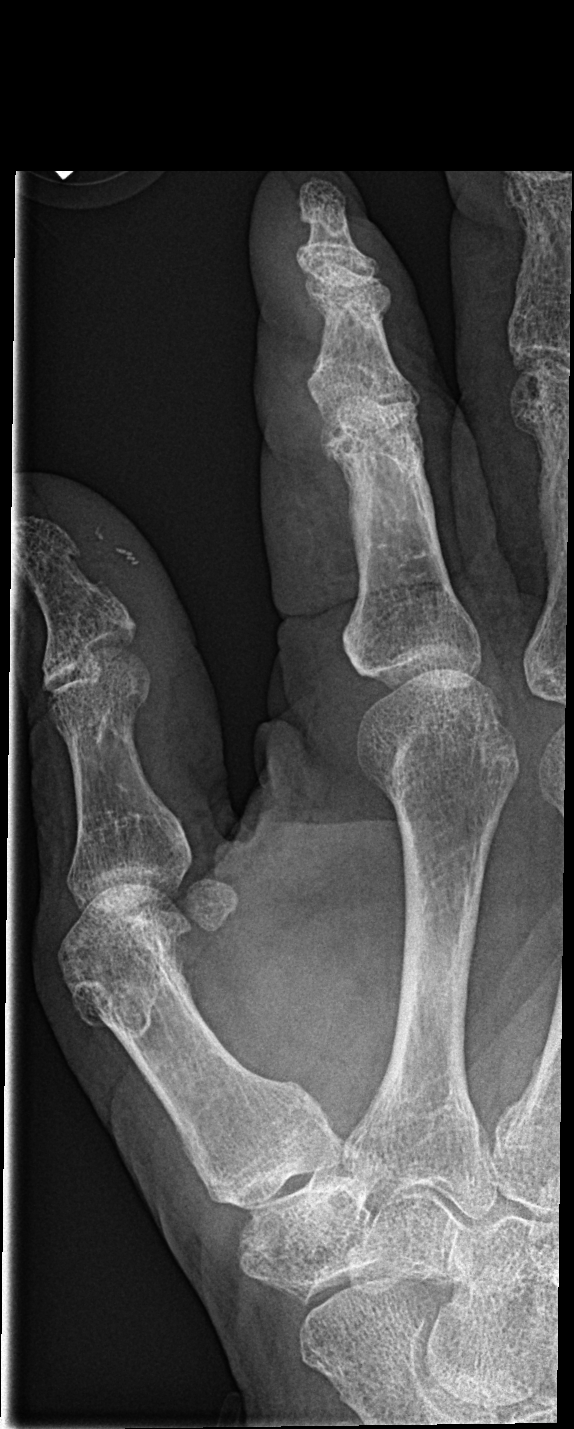
[im 3/3]
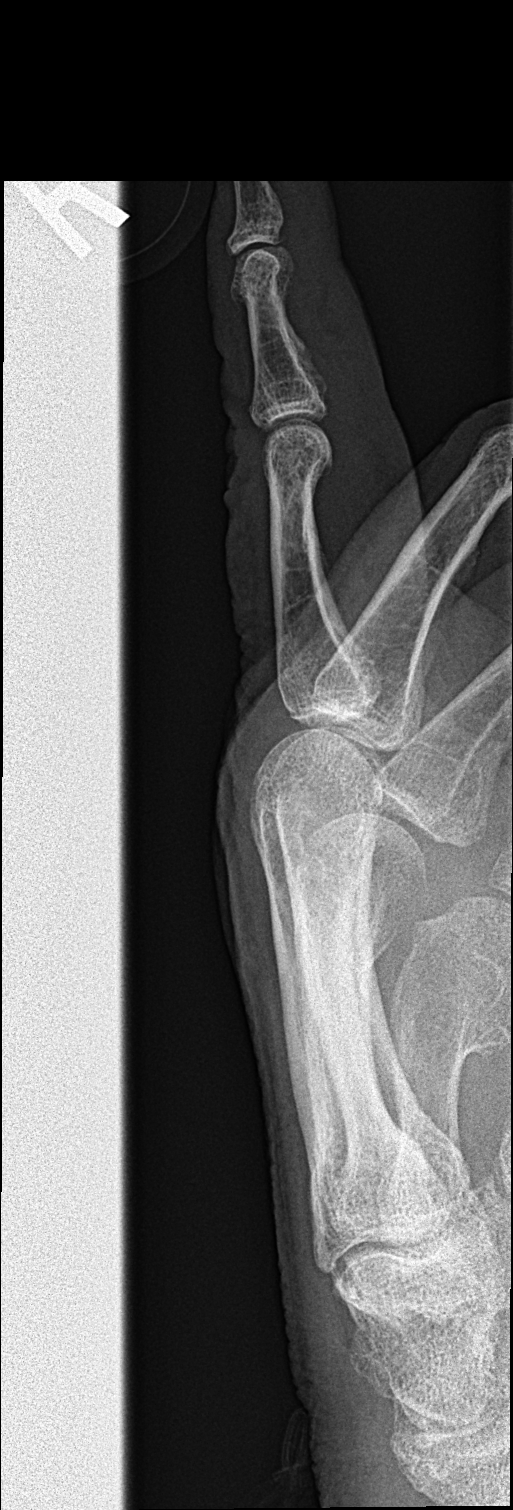

[3 of 3 positions shown; findings below may reference images not displayed]

FINDINGS: The bones appear somewhat osteopenic. No acute fracture is seen.
Joint spaces appear normal. No significant degenerative changes
noted. There is no evidence of erosion. Small foreign bodies cannot
be excluded in the soft tissues of the distal right thumb.
IMPRESSION: 1. No significant abnormality.  Question mild osteopenia.
2. Question small opaque foreign bodies in the soft tissues of the
distal right thumb.

## 2017-12-04 ENCOUNTER — Other Ambulatory Visit: Payer: Self-pay | Admitting: Gastroenterology

## 2017-12-04 DIAGNOSIS — B182 Chronic viral hepatitis C: Secondary | ICD-10-CM

## 2017-12-04 DIAGNOSIS — R7989 Other specified abnormal findings of blood chemistry: Secondary | ICD-10-CM

## 2017-12-04 DIAGNOSIS — D509 Iron deficiency anemia, unspecified: Secondary | ICD-10-CM

## 2017-12-04 DIAGNOSIS — R945 Abnormal results of liver function studies: Secondary | ICD-10-CM

## 2017-12-12 ENCOUNTER — Ambulatory Visit
Admission: RE | Admit: 2017-12-12 | Discharge: 2017-12-12 | Disposition: A | Discharge status: Home / Self Care | Payer: Medicare Other | Source: Ambulatory Visit | Attending: Gastroenterology | Admitting: Gastroenterology

## 2017-12-12 DIAGNOSIS — R7989 Other specified abnormal findings of blood chemistry: Secondary | ICD-10-CM

## 2017-12-12 DIAGNOSIS — R945 Abnormal results of liver function studies: Secondary | ICD-10-CM

## 2017-12-12 DIAGNOSIS — D509 Iron deficiency anemia, unspecified: Secondary | ICD-10-CM | POA: Insufficient documentation

## 2018-01-08 ENCOUNTER — Encounter: Admission: RE | Payer: Self-pay | Source: Ambulatory Visit

## 2018-01-08 ENCOUNTER — Ambulatory Visit: Admission: RE | Admit: 2018-01-08 | Payer: Medicare Other | Source: Ambulatory Visit | Admitting: Gastroenterology

## 2018-01-08 SURGERY — ESOPHAGOGASTRODUODENOSCOPY (EGD) WITH PROPOFOL
Anesthesia: General

## 2018-01-21 ENCOUNTER — Ambulatory Visit: Payer: Medicare Other | Admitting: Family Medicine

## 2018-01-21 ENCOUNTER — Ambulatory Visit: Payer: Self-pay | Admitting: Family Medicine

## 2018-01-26 ENCOUNTER — Ambulatory Visit: Payer: Self-pay | Admitting: Family Medicine

## 2018-01-30 DIAGNOSIS — H2512 Age-related nuclear cataract, left eye: Secondary | ICD-10-CM | POA: Diagnosis not present

## 2018-01-30 LAB — HM DIABETES EYE EXAM

## 2018-02-06 DIAGNOSIS — H269 Unspecified cataract: Secondary | ICD-10-CM

## 2018-02-06 HISTORY — DX: Unspecified cataract: H26.9

## 2018-02-09 DIAGNOSIS — E1169 Type 2 diabetes mellitus with other specified complication: Secondary | ICD-10-CM | POA: Diagnosis not present

## 2018-02-09 DIAGNOSIS — Z8679 Personal history of other diseases of the circulatory system: Secondary | ICD-10-CM | POA: Diagnosis not present

## 2018-02-09 DIAGNOSIS — I481 Persistent atrial fibrillation: Secondary | ICD-10-CM | POA: Diagnosis not present

## 2018-02-09 DIAGNOSIS — E785 Hyperlipidemia, unspecified: Secondary | ICD-10-CM | POA: Diagnosis not present

## 2018-02-09 DIAGNOSIS — E1159 Type 2 diabetes mellitus with other circulatory complications: Secondary | ICD-10-CM | POA: Diagnosis not present

## 2018-02-09 DIAGNOSIS — I10 Essential (primary) hypertension: Secondary | ICD-10-CM | POA: Diagnosis not present

## 2018-02-09 DIAGNOSIS — I480 Paroxysmal atrial fibrillation: Secondary | ICD-10-CM | POA: Diagnosis not present

## 2018-02-09 DIAGNOSIS — R0602 Shortness of breath: Secondary | ICD-10-CM | POA: Diagnosis not present

## 2018-02-13 ENCOUNTER — Ambulatory Visit: Payer: Medicare Other | Admitting: Family Medicine

## 2018-02-17 ENCOUNTER — Ambulatory Visit: Payer: Medicare Other | Admitting: Family Medicine

## 2018-02-23 ENCOUNTER — Encounter: Payer: Self-pay | Admitting: Family Medicine

## 2018-02-23 ENCOUNTER — Ambulatory Visit (INDEPENDENT_AMBULATORY_CARE_PROVIDER_SITE_OTHER): Payer: Medicare Other | Admitting: Family Medicine

## 2018-02-23 ENCOUNTER — Encounter: Payer: Self-pay | Admitting: General Surgery

## 2018-02-23 VITALS — BP 136/82 | HR 69 | Temp 97.8°F | Resp 18 | Ht 70.0 in | Wt 226.7 lb

## 2018-02-23 DIAGNOSIS — R0781 Pleurodynia: Secondary | ICD-10-CM

## 2018-02-23 DIAGNOSIS — L0231 Cutaneous abscess of buttock: Secondary | ICD-10-CM | POA: Diagnosis not present

## 2018-02-23 DIAGNOSIS — W19XXXA Unspecified fall, initial encounter: Secondary | ICD-10-CM

## 2018-02-23 NOTE — Progress Notes (Signed)
Name: Marvin Lewis   MRN: [id number]    DOB: Mar 04, 1951   Date:02/23/2018       Progress Note  Subjective  Chief Complaint  Chief Complaint  Patient presents with  . Wound Infection    on buttock  . Fall    HPI  Pt presents with concern for wound in buttock.  He reports being in Rehab in October 2018 after hip fracture (repaired by Dr. Mack Guise) - developed abscess and was placed on several weeks of antibiotics, this cleared the infection.    It has since returned about 2 months ago.  Denies pain at the site, denies abnormal numbness/tingling (has baseline intermittent numbness in BLE), no bowel/bladder incontinence; has not received outside care for this issue since flare 2 months ago began.  Has one episode of small amount of bleeding 1 week ago - no other drainage.  History of pilonidal cyst 40+ years ago and this was cleared through surgical procedure and several rounds of antibiotics.  Fall w/ right rib pain: fell taking the trash out 1 week ago.  Denies chest pain or shortness of breath, but has had right rib pain when coughing only.  Has history right middle lobe collapse noted 06/17/2017 and on notes from Dr. Raul Del 10/22/2017 - exam is consistent with this today as breath sounds are not present in RML only. No other injury noted.   Patient Active Problem List   Diagnosis Date Noted  . Closed right hip fracture ([place]) 09/16/2017  . Acute respiratory distress   . Acute on chronic congestive heart failure ([place])   . Palliative care by specialist   . Acute respiratory failure ([place]) 09/03/2017  . Aortic aneurysm ([place]) 06/19/2017  . Hemoptysis 06/17/2017  . A-fib ([place]) 05/21/2016  . Leg cramps 05/08/2016  . Anemia 05/08/2016  . Atrial fibrillation ([place]) 05/08/2016  . Abnormal weight gain 04/22/2016  . Goals of care, counseling/discussion 04/22/2016  . Elevated brain natriuretic peptide (BNP) level 04/22/2016  . Persistent atrial fibrillation ([place]) 03/29/2016  .  Breathlessness on exertion 03/27/2016  . Gout 02/23/2016  . Hyperkalemia 01/12/2016  . Onychomycosis 01/10/2016  . Hoarseness of voice 01/10/2016  . Tobacco abuse 01/10/2016  . Hx of completed stroke 01/10/2016  . Athletes foot 08/09/2015  . Chronic alcoholism ([place])   . Movement disorder   . COPD (chronic obstructive pulmonary disease) ([place])   . Emphysema of lung ([place])   . Type II diabetes mellitus, uncontrolled ([place])   . Hyperlipidemia   . Hypertension   . CAD (coronary artery disease)   . Hepatitis C   . Cirrhosis ([place])   . Alcohol abuse   . Onychogryphosis   . Insomnia   . Hip arthritis   . Hypoxemia   . 1st degree AV block   . COPD exacerbation ([place]) 05/26/2015  . Controlled type 2 diabetes mellitus without complication ([place]) 92/33/0076    Social History   Tobacco Use  . Smoking status: Current Every Day Smoker    Packs/day: 0.25    Years: 40.00    Pack years: 10.00    Types: Cigarettes  . Smokeless tobacco: Never Used  Substance Use Topics  . Alcohol use: Yes    Comment: Per wife, pt is alcoholic     Current Outpatient Medications:  .  amLODipine (NORVASC) 5 MG tablet, Take 5 mg by mouth daily., Disp: , Rfl:  .  atorvastatin (LIPITOR) 10 MG tablet, Take 1 tablet (10 mg total) by mouth at bedtime., Disp:  7 tablet, Rfl: 0 .  B-D INS SYRINGE 0.5CC/30GX1/2" 30G X 1/2" 0.5 ML MISC, AS DIRECTED., Disp: 100 each, Rfl: 12 .  benazepril (LOTENSIN) 10 MG tablet, Take 10 mg by mouth daily. , Disp: , Rfl:  .  furosemide (LASIX) 20 MG tablet, Take 2 tablets (40 mg total) by mouth daily., Disp: 30 tablet, Rfl: 0 .  insulin degludec (TRESIBA) 100 UNIT/ML SOPN FlexTouch Pen, Inject 48 Units into the skin at bedtime., Disp: , Rfl:  .  omeprazole (PRILOSEC) 20 MG capsule, Take 20 mg by mouth daily., Disp: , Rfl:  .  sotalol (BETAPACE) 80 MG tablet, Take 1 tablet (80 mg total) by mouth 2 (two) times daily., Disp: 60 tablet, Rfl: 11 .  zolpidem (AMBIEN) 10 MG tablet, Take 10 mg  by mouth at bedtime as needed for sleep. , Disp: , Rfl:  .  docusate sodium (COLACE) 100 MG capsule, Take 1 capsule (100 mg total) by mouth 2 (two) times daily. (Patient not taking: Reported on 02/23/2018), Disp: 10 capsule, Rfl: 0 .  enoxaparin (LOVENOX) 40 MG/0.4ML injection, Inject 0.4 mLs (40 mg total) into the skin daily., Disp: 0 Syringe, Rfl:  .  ferrous sulfate 325 (65 FE) MG tablet, Take 1 tablet (325 mg total) by mouth 2 (two) times daily with a meal. (Patient not taking: Reported on 02/23/2018), Disp: 60 tablet, Rfl: 1 .  fluticasone-salmeterol (ADVAIR HFA) 115-21 MCG/ACT inhaler, Inhale 2 puffs into the lungs every 12 (twelve) hours., Disp: , Rfl:  .  folic acid (FOLVITE) 1 MG tablet, Take 1 tablet (1 mg total) by mouth daily. (Patient not taking: Reported on 02/23/2018), Disp: , Rfl:  .  ipratropium-albuterol (DUONEB) 0.5-2.5 (3) MG/3ML SOLN, Take 3 mLs by nebulization every 6 (six) hours as needed. For shortness of breath/wheezing, Disp: , Rfl:  .  nicotine (NICODERM CQ - DOSED IN MG/24 HOURS) 21 mg/24hr patch, Place 1 patch (21 mg total) onto the skin daily., Disp: 28 patch, Rfl: 0 .  oxyCODONE 10 MG TABS, Take 1 tablet (10 mg total) by mouth every 3 (three) hours as needed for breakthrough pain ((for MODERATE breakthrough pain)). (Patient not taking: Reported on 02/23/2018), Disp: 30 tablet, Rfl: 0  No Known Allergies  ROS  Constitutional: Negative for fever or weight change.  Respiratory: See HPI Cardiovascular: Negative for chest pain or palpitations.  Gastrointestinal: Negative for abdominal pain, no bowel changes.  Musculoskeletal: Negative for gait problem or joint swelling.  Skin: Negative for rash. See HPI Neurological: Negative for dizziness or headache.  No other specific complaints in a complete review of systems (except as listed in HPI above).  Objective  Vitals:   02/23/18 1140  BP: 136/82  Pulse: 69  Resp: 18  Temp: 97.8 F (36.6 C)  TempSrc: Oral  SpO2: 94%   Weight: 226 lb 11.2 oz (102.8 kg)  Height: 5\' 10"  (1.778 m)   Body mass index is 32.53 kg/m.  Nursing Note and Vital Signs reviewed.  Physical Exam  Constitutional: Patient appears well-developed and well-nourished. Obese No distress.  HEENT: head atraumatic, normocephalic, neck supple without lymphadenopathy, oropharynx pink and moist without exudate Cardiovascular: Normal rate, regular rhythm, S1/S2 present.  No murmur or rub heard. No BLE edema. Pulmonary/Chest: Effort normal and breath sounds clear, baseline absent breath sounds in RML. No respiratory distress or retractions.  Ribcage is non-tender to palpation, respiratory effort is normal. Psychiatric: Patient has a normal mood and affect. behavior is normal. Judgment and thought content normal. Skin:  Quarter-sized firm are to right medial buttock that is hyperpigmented.  No drainage, no fluctuance, no erythema.  Area is non-tender to palpation.  No results found for this or any previous visit (from the past 72 hour(s)).  Assessment & Plan   1. Abscess of right buttock - Ambulatory referral to [place] does not appear to be acutely infected, so antibiotics are not indicated today.  Recommend follow up with General Surgeon for consultation for possible removal. - Strict return precautions discussed - fevers/chills, purulent drainage or increase in sanguinous drainage, pain, or any other concerning symptom.  2. Rib pain - Pt has significant pulmonary history which is reviewed.  He reports his respirations are at baseline and he only has moderate ribcage pain in the RIGHT lower ribs when coughing, not with deep inspiration or with movement.  I advised that he continue to monitor his symptoms, and if fevers/chills, worsening/unimproving pain, or any other concerning symptoms, he should follow up with PCP or Dr. Raul Del (Pulmonology).  - Discussed option for rib Xray today, and he declines.  3. Fall, initial  encounter - Discussed safety in the home and fall precautions - pt ambulates with a cane, encouraged continued use of this including while at home.

## 2018-03-06 DIAGNOSIS — H2512 Age-related nuclear cataract, left eye: Secondary | ICD-10-CM | POA: Diagnosis not present

## 2018-03-11 ENCOUNTER — Encounter: Payer: Self-pay | Admitting: *Deleted

## 2018-03-11 NOTE — Anesthesia Pain Management Evaluation Note (Addendum)
CERVICAL DISC COMPRESSION/ TRAUMATIC INJURY/ DIFFICULTY SWALLOWING WIFE STATES DID REQUIRE MULTIPLE REINTUBATIONS AFTER GENERAL ANES. NO PROBLEM WITH MAC LAST CATARACT SURGERY

## 2018-03-17 ENCOUNTER — Ambulatory Visit: Payer: Medicare Other | Admitting: Family Medicine

## 2018-03-17 ENCOUNTER — Ambulatory Visit: Payer: Self-pay | Admitting: General Surgery

## 2018-03-23 DIAGNOSIS — J431 Panlobular emphysema: Secondary | ICD-10-CM | POA: Diagnosis not present

## 2018-03-23 DIAGNOSIS — R05 Cough: Secondary | ICD-10-CM | POA: Diagnosis not present

## 2018-03-23 DIAGNOSIS — I712 Thoracic aortic aneurysm, without rupture: Secondary | ICD-10-CM | POA: Diagnosis not present

## 2018-03-23 DIAGNOSIS — G479 Sleep disorder, unspecified: Secondary | ICD-10-CM | POA: Diagnosis not present

## 2018-03-23 DIAGNOSIS — J449 Chronic obstructive pulmonary disease, unspecified: Secondary | ICD-10-CM | POA: Diagnosis not present

## 2018-03-25 ENCOUNTER — Encounter
Admission: RE | Disposition: A | Discharge status: Home / Self Care | Payer: Self-pay | Source: Ambulatory Visit | Attending: Ophthalmology

## 2018-03-25 ENCOUNTER — Ambulatory Visit
Admission: RE | Admit: 2018-03-25 | Discharge: 2018-03-25 | Disposition: A | Discharge status: Home / Self Care | Payer: Medicare Other | Source: Ambulatory Visit | Attending: Ophthalmology | Admitting: Ophthalmology

## 2018-03-25 ENCOUNTER — Ambulatory Visit: Payer: Medicare Other | Admitting: Anesthesiology

## 2018-03-25 DIAGNOSIS — I509 Heart failure, unspecified: Secondary | ICD-10-CM | POA: Insufficient documentation

## 2018-03-25 DIAGNOSIS — K219 Gastro-esophageal reflux disease without esophagitis: Secondary | ICD-10-CM | POA: Insufficient documentation

## 2018-03-25 DIAGNOSIS — I739 Peripheral vascular disease, unspecified: Secondary | ICD-10-CM | POA: Diagnosis not present

## 2018-03-25 DIAGNOSIS — G4730 Sleep apnea, unspecified: Secondary | ICD-10-CM | POA: Insufficient documentation

## 2018-03-25 DIAGNOSIS — I110 Hypertensive heart disease with heart failure: Secondary | ICD-10-CM | POA: Insufficient documentation

## 2018-03-25 DIAGNOSIS — Z8673 Personal history of transient ischemic attack (TIA), and cerebral infarction without residual deficits: Secondary | ICD-10-CM | POA: Insufficient documentation

## 2018-03-25 DIAGNOSIS — I2510 Atherosclerotic heart disease of native coronary artery without angina pectoris: Secondary | ICD-10-CM | POA: Insufficient documentation

## 2018-03-25 DIAGNOSIS — E1151 Type 2 diabetes mellitus with diabetic peripheral angiopathy without gangrene: Secondary | ICD-10-CM | POA: Diagnosis not present

## 2018-03-25 DIAGNOSIS — Z794 Long term (current) use of insulin: Secondary | ICD-10-CM | POA: Insufficient documentation

## 2018-03-25 DIAGNOSIS — H2512 Age-related nuclear cataract, left eye: Secondary | ICD-10-CM | POA: Diagnosis not present

## 2018-03-25 DIAGNOSIS — Z87891 Personal history of nicotine dependence: Secondary | ICD-10-CM | POA: Diagnosis not present

## 2018-03-25 DIAGNOSIS — Z79899 Other long term (current) drug therapy: Secondary | ICD-10-CM | POA: Diagnosis not present

## 2018-03-25 DIAGNOSIS — J449 Chronic obstructive pulmonary disease, unspecified: Secondary | ICD-10-CM | POA: Insufficient documentation

## 2018-03-25 HISTORY — DX: Cervical disc disorder, unspecified, unspecified cervical region: M50.90

## 2018-03-25 HISTORY — DX: Hypoxemia: R09.02

## 2018-03-25 HISTORY — DX: Cardiac arrhythmia, unspecified: I49.9

## 2018-03-25 HISTORY — DX: Other complications of anesthesia, initial encounter: T88.59XA

## 2018-03-25 HISTORY — PX: CATARACT EXTRACTION W/PHACO: SHX586

## 2018-03-25 HISTORY — DX: Adverse effect of unspecified anesthetic, initial encounter: T41.45XA

## 2018-03-25 LAB — GLUCOSE, CAPILLARY: Glucose-Capillary: 104 mg/dL — ABNORMAL HIGH (ref 65–99)

## 2018-03-25 SURGERY — PHACOEMULSIFICATION, CATARACT, WITH IOL INSERTION
Anesthesia: Monitor Anesthesia Care | Site: Eye | Laterality: Left | Wound class: "Clean "

## 2018-03-25 MED ORDER — LIDOCAINE HCL (PF) 4 % IJ SOLN
INTRAOCULAR | Status: DC | PRN
  Administered 2018-03-25: 2 mL via OPHTHALMIC

## 2018-03-25 MED ORDER — SODIUM CHLORIDE 0.9 % IV SOLN
INTRAVENOUS | Status: DC
  Administered 2018-03-25: 07:00:00 via INTRAVENOUS

## 2018-03-25 MED ORDER — MOXIFLOXACIN HCL 0.5 % OP SOLN: 1.0000 [drp] | OPHTHALMIC | Status: DC | PRN

## 2018-03-25 MED ORDER — MOXIFLOXACIN HCL 0.5 % OP SOLN
OPHTHALMIC | Status: DC | PRN
  Administered 2018-03-25: .2 mL via OPHTHALMIC

## 2018-03-25 MED ORDER — LIDOCAINE HCL (PF) 4 % IJ SOLN
INTRAMUSCULAR | Status: AC
  Filled 2018-03-25: qty 5

## 2018-03-25 MED ORDER — MOXIFLOXACIN HCL 0.5 % OP SOLN
OPHTHALMIC | Status: AC
  Filled 2018-03-25: qty 3

## 2018-03-25 MED ORDER — EPINEPHRINE PF 1 MG/ML IJ SOLN
INTRAMUSCULAR | Status: AC
  Filled 2018-03-25: qty 1

## 2018-03-25 MED ORDER — NA CHONDROIT SULF-NA HYALURON 40-17 MG/ML IO SOLN
INTRAOCULAR | Status: AC
  Filled 2018-03-25: qty 1

## 2018-03-25 MED ORDER — NA CHONDROIT SULF-NA HYALURON 40-17 MG/ML IO SOLN
INTRAOCULAR | Status: DC | PRN
  Administered 2018-03-25: 1 mL via INTRAOCULAR

## 2018-03-25 MED ORDER — POVIDONE-IODINE 5 % OP SOLN
OPHTHALMIC | Status: AC
  Filled 2018-03-25: qty 30

## 2018-03-25 MED ORDER — POVIDONE-IODINE 5 % OP SOLN
OPHTHALMIC | Status: DC | PRN
  Administered 2018-03-25: 1 via OPHTHALMIC

## 2018-03-25 MED ORDER — EPINEPHRINE PF 1 MG/ML IJ SOLN
INTRAOCULAR | Status: DC | PRN
  Administered 2018-03-25: 1 mL via OPHTHALMIC

## 2018-03-25 MED ORDER — ARMC OPHTHALMIC DILATING DROPS
OPHTHALMIC | Status: AC
  Administered 2018-03-25: 1 via OPHTHALMIC
  Filled 2018-03-25: qty 0.4

## 2018-03-25 MED ORDER — CARBACHOL 0.01 % IO SOLN
INTRAOCULAR | Status: DC | PRN
  Administered 2018-03-25: .5 mL via INTRAOCULAR

## 2018-03-25 MED ORDER — MIDAZOLAM HCL 2 MG/2ML IJ SOLN
INTRAMUSCULAR | Status: DC | PRN
  Administered 2018-03-25 (×2): 1 mg via INTRAVENOUS

## 2018-03-25 MED ORDER — ARMC OPHTHALMIC DILATING DROPS
1.0000 "application " | OPHTHALMIC | Status: AC
  Administered 2018-03-25 (×3): 1 via OPHTHALMIC

## 2018-03-25 MED ORDER — MIDAZOLAM HCL 2 MG/2ML IJ SOLN
INTRAMUSCULAR | Status: AC
  Filled 2018-03-25: qty 2

## 2018-03-25 SURGICAL SUPPLY — 16 items
GLOVE BIO SURGEON STRL SZ8 (GLOVE) ×3 IMPLANT
GLOVE BIOGEL M 6.5 STRL (GLOVE) ×3 IMPLANT
GLOVE SURG LX 8.0 MICRO (GLOVE) ×2
GLOVE SURG LX STRL 8.0 MICRO (GLOVE) ×1 IMPLANT
GOWN STRL REUS W/ TWL LRG LVL3 (GOWN DISPOSABLE) ×2 IMPLANT
GOWN STRL REUS W/TWL LRG LVL3 (GOWN DISPOSABLE) ×4
LABEL CATARACT MEDS ST (LABEL) ×3 IMPLANT
LENS IOL TECNIS ITEC 21.0 (Intraocular Lens) ×2 IMPLANT
PACK CATARACT (MISCELLANEOUS) ×3 IMPLANT
PACK CATARACT BRASINGTON LX (MISCELLANEOUS) ×3 IMPLANT
PACK EYE AFTER SURG (MISCELLANEOUS) ×3 IMPLANT
SOL BSS BAG (MISCELLANEOUS) ×3
SOLUTION BSS BAG (MISCELLANEOUS) ×1 IMPLANT
SYR 5ML LL (SYRINGE) ×3 IMPLANT
WATER STERILE IRR 250ML POUR (IV SOLUTION) ×3 IMPLANT
WIPE NON LINTING 3.25X3.25 (MISCELLANEOUS) ×3 IMPLANT

## 2018-03-25 NOTE — Anesthesia Post-op Follow-up Note (Signed)
Anesthesia QCDR form completed.        

## 2018-03-25 NOTE — Discharge Instructions (Signed)
FOLLOW DR. PORFILIO'S POSTOP EYE DROP INSTRUCTION SHEET AS REVIEWED.  Eye Surgery Discharge Instructions  Expect mild scratchy sensation or mild soreness. DO NOT RUB YOUR EYE!  The day of surgery:  Minimal physical activity, but bed rest is not required  No reading, computer work, or close hand work  No bending, lifting, or straining.  May watch TV  For 24 hours:  No driving, legal decisions, or alcoholic beverages  Safety precautions  Eat anything you prefer: It is better to start with liquids, then soup then solid foods.  _____ Eye patch should be worn until postoperative exam tomorrow.  ____ Solar shield eyeglasses should be worn for comfort in the sunlight/patch while sleeping  Resume all regular medications including aspirin or Coumadin if these were discontinued prior to surgery. You may shower, bathe, shave, or wash your hair. Tylenol may be taken for mild discomfort.  Call your doctor if you experience significant pain, nausea, or vomiting, fever > 101 or other signs of infection. [phone] or [phone] Specific instructions:  Follow-up Information    Birder Robson, MD Follow up.   Specialty:  Ophthalmology Why:  Thursday 03/26/18 @ 11:00 am Contact information: [address] [place] [address] [phone]

## 2018-03-25 NOTE — Op Note (Signed)
PREOPERATIVE DIAGNOSIS:  Nuclear sclerotic cataract of the left eye.   POSTOPERATIVE DIAGNOSIS:  Nuclear sclerotic cataract of the left eye.   OPERATIVE PROCEDURE: Procedure(s): CATARACT EXTRACTION PHACO AND INTRAOCULAR LENS PLACEMENT (IOC)   SURGEON:  Birder Robson, MD.   ANESTHESIA:  Anesthesiologist: Molli Barrows, MD CRNA: Eben Burow, CRNA  1.      Managed anesthesia care. 2.     0.11ml of Shugarcaine was instilled following the paracentesis   COMPLICATIONS:  None.   TECHNIQUE:   Stop and chop   DESCRIPTION OF PROCEDURE:  The patient was examined and consented in the preoperative holding area where the aforementioned topical anesthesia was applied to the left eye and then brought back to the Operating Room where the left eye was prepped and draped in the usual sterile ophthalmic fashion and a lid speculum was placed. A paracentesis was created with the side port blade and the anterior chamber was filled with viscoelastic. A near clear corneal incision was performed with the steel keratome. A continuous curvilinear capsulorrhexis was performed with a cystotome followed by the capsulorrhexis forceps. Hydrodissection and hydrodelineation were carried out with BSS on a blunt cannula. The lens was removed in a stop and chop  technique and the remaining cortical material was removed with the irrigation-aspiration handpiece. The capsular bag was inflated with viscoelastic and the Technis [id number] lens was placed in the capsular bag without complication. The remaining viscoelastic was removed from the eye with the irrigation-aspiration handpiece. The wounds were hydrated. The anterior chamber was flushed with Miostat and the eye was inflated to physiologic pressure. 0.79ml Vigamox was placed in the anterior chamber. The wounds were found to be water tight. The eye was dressed with Vigamox. The patient was given protective glasses to wear throughout the day and a shield with which to sleep  tonight. The patient was also given drops with which to begin a drop regimen today and will follow-up with me in one day. Implant Name Type Inv. Item Serial No. Manufacturer Lot No. LRB No. Used  LENS IOL DIOP 21.0 - [id number] Intraocular Lens LENS IOL DIOP 21.0 [id number] AMO  Left 1    Procedure(s) with comments: CATARACT EXTRACTION PHACO AND INTRAOCULAR LENS PLACEMENT (IOC) (Left) - [place]  01:21 AP%17.6 CDE14.31 Fluid pack lot # [id number]  Electronically signed: Birder Robson 03/25/2018 8:09 AM

## 2018-03-25 NOTE — Anesthesia Preprocedure Evaluation (Signed)
Anesthesia Evaluation  Patient identified by MRN, date of birth, ID band Patient awake    Reviewed: Allergy & Precautions, H&P , NPO status , Patient's Chart, lab work & pertinent test results, reviewed documented beta blocker date and time   History of Anesthesia Complications (+) history of anesthetic complications  Airway Mallampati: II  TM Distance: >3 FB Neck ROM: full    Dental no notable dental hx. (+) Teeth Intact   Pulmonary neg pulmonary ROS, shortness of breath, sleep apnea , pneumonia, resolved, COPD, Current Smoker,    Pulmonary exam normal breath sounds clear to auscultation       Cardiovascular Exercise Tolerance: Good hypertension, + CAD, + Peripheral Vascular Disease and +CHF  negative cardio ROS  + dysrhythmias  Rhythm:regular Rate:Normal     Neuro/Psych PSYCHIATRIC DISORDERS Depression CVA, No Residual Symptoms negative neurological ROS  negative psych ROS   GI/Hepatic negative GI ROS, Neg liver ROS, hiatal hernia, GERD  Medicated,(+) Hepatitis -  Endo/Other  negative endocrine ROSdiabetes  Renal/GU      Musculoskeletal   Abdominal   Peds  Hematology negative hematology ROS (+) anemia ,   Anesthesia Other Findings   Reproductive/Obstetrics negative OB ROS                             Anesthesia Physical Anesthesia Plan  ASA: IV  Anesthesia Plan: MAC   Post-op Pain Management:    Induction:   PONV Risk Score and Plan:   Airway Management Planned:   Additional Equipment:   Intra-op Plan:   Post-operative Plan:   Informed Consent: I have reviewed the patients History and Physical, chart, labs and discussed the procedure including the risks, benefits and alternatives for the proposed anesthesia with the patient or authorized representative who has indicated his/her understanding and acceptance.     Plan Discussed with: CRNA  Anesthesia Plan Comments:          Anesthesia Quick Evaluation

## 2018-03-25 NOTE — Transfer of Care (Signed)
Immediate Anesthesia Transfer of Care Note  Patient: Marvin Lewis  Procedure(s) Performed: CATARACT EXTRACTION PHACO AND INTRAOCULAR LENS PLACEMENT (IOC) (Left Eye)  Patient Location: Short Stay  Anesthesia Type:MAC  Level of Consciousness: awake, alert , oriented and patient cooperative  Airway & Oxygen Therapy: Patient Spontanous Breathing  Post-op Assessment: Report given to RN and Post -op Vital signs reviewed and stable  Post vital signs: Reviewed and stable  Last Vitals:  Vitals Value Taken Time  BP    Temp    Pulse    Resp    SpO2      Last Pain:  Vitals:   03/25/18 0641  TempSrc: Tympanic         Complications: No apparent anesthesia complications

## 2018-03-25 NOTE — H&P (Signed)
All labs reviewed. Abnormal studies sent to patients PCP when indicated.  Previous H&P reviewed, patient examined, there are NO CHANGES.  Joceline Hinchcliff Porfilio4/17/20197:42 AM

## 2018-03-26 NOTE — Anesthesia Postprocedure Evaluation (Signed)
Anesthesia Post Note  Patient: TERALD JUMP  Procedure(s) Performed: CATARACT EXTRACTION PHACO AND INTRAOCULAR LENS PLACEMENT ([place]) (Left Eye)  Patient location during evaluation: PACU Anesthesia Type: MAC Level of consciousness: awake and alert Pain management: pain level controlled Vital Signs Assessment: post-procedure vital signs reviewed and stable Respiratory status: spontaneous breathing, nonlabored ventilation, respiratory function stable and patient connected to nasal cannula oxygen Cardiovascular status: blood pressure returned to baseline and stable Postop Assessment: no apparent nausea or vomiting Anesthetic complications: no     Last Vitals:  Vitals:   03/25/18 0809 03/25/18 0816  BP: 133/68 118/64  Pulse: (!) 56 (!) 59  Resp: 18 18  Temp: (!) 36.3 C   SpO2: 96% 95%    Last Pain:  Vitals:   03/26/18 0816  TempSrc:   PainSc: 0-No pain                 Molli Barrows

## 2018-04-02 DIAGNOSIS — Z794 Long term (current) use of insulin: Secondary | ICD-10-CM | POA: Diagnosis not present

## 2018-04-02 DIAGNOSIS — E1159 Type 2 diabetes mellitus with other circulatory complications: Secondary | ICD-10-CM | POA: Diagnosis not present

## 2018-04-02 DIAGNOSIS — E1142 Type 2 diabetes mellitus with diabetic polyneuropathy: Secondary | ICD-10-CM | POA: Diagnosis not present

## 2018-04-02 DIAGNOSIS — E785 Hyperlipidemia, unspecified: Secondary | ICD-10-CM | POA: Diagnosis not present

## 2018-04-02 DIAGNOSIS — E1169 Type 2 diabetes mellitus with other specified complication: Secondary | ICD-10-CM | POA: Diagnosis not present

## 2018-04-02 DIAGNOSIS — I10 Essential (primary) hypertension: Secondary | ICD-10-CM | POA: Diagnosis not present

## 2018-04-02 LAB — HEMOGLOBIN A1C: HEMOGLOBIN A1C: 7.4

## 2018-04-28 ENCOUNTER — Encounter: Payer: Self-pay | Admitting: Family Medicine

## 2018-04-28 ENCOUNTER — Ambulatory Visit (INDEPENDENT_AMBULATORY_CARE_PROVIDER_SITE_OTHER): Payer: Medicare Other | Admitting: Family Medicine

## 2018-04-28 ENCOUNTER — Encounter

## 2018-04-28 VITALS — BP 136/64 | HR 71 | Temp 97.9°F | Resp 16 | Ht 70.0 in | Wt 235.2 lb

## 2018-04-28 DIAGNOSIS — I2510 Atherosclerotic heart disease of native coronary artery without angina pectoris: Secondary | ICD-10-CM

## 2018-04-28 DIAGNOSIS — E11649 Type 2 diabetes mellitus with hypoglycemia without coma: Secondary | ICD-10-CM

## 2018-04-28 DIAGNOSIS — S31819D Unspecified open wound of right buttock, subsequent encounter: Secondary | ICD-10-CM

## 2018-04-28 DIAGNOSIS — J439 Emphysema, unspecified: Secondary | ICD-10-CM

## 2018-04-28 DIAGNOSIS — I712 Thoracic aortic aneurysm, without rupture, unspecified: Secondary | ICD-10-CM

## 2018-04-28 NOTE — Assessment & Plan Note (Signed)
Managed by pulmonologist

## 2018-04-28 NOTE — Patient Instructions (Addendum)
We'll have you see the wound care center Request vaccine record from [facility] in [place]

## 2018-04-28 NOTE — Progress Notes (Signed)
BP 136/64   Pulse 71   Temp 97.9 F (36.6 C) (Oral)   Resp 16   Ht _0  (1.778 m)   Wt 235 lb 3.2 oz (106.7 kg)   SpO2 94%   BMI 33.75 kg/m    Subjective:    Patient ID: Marvin Lewis, male    DOB: Oct 26, 1951, 67 y.o.   MRN: [id number]  HPI: NOLAND PIZANO is a 67 y.o. male  Chief Complaint  Patient presents with  . Cyst    on buttocks, saw PA several months ago and was referred to a Psychologist, sport and exercise.  Pt never went    HPI Patient is here for f/u Last visit with me was actually almost two years ago, May 08, 2016  He has a pilonidal cyst; had surgery on it, webnt through the whole 9 yards; has a tunnel there and is coming out throug his cheek Flared up when he broke his hip; fell and broke the top of right hip and that's when the cyst flared up; they gave him antibiotics and it healed up during rehab, but flared up and came back; NP referred him to the surgeon, so never saw the surgeon; cannot have general anesthesia for pulmonary reasons; he quit smoking in rehab May have had some fevers in the last two weeks; no thermometer; felt a little hot and sweaty; it is drainage; mostly blood; has not seen wound center  His endocrnologist, Dr. Honor Junes, is managing his diabetes Lab Results  Component Value Date   HGBA1C 7.4 04/02/2018   He sees Dr. Raul Del for pulmonary issues, emphysema, abnormal imaging, calcified mass  Dr. Ubaldo Glassing manages the thoracic aortic aneurysm, 4.4 cm Left main and 3 vessel disease on the chest CT Atrial fibrillation; was on blood thinner but was coughing up blood, so stopped; sotalol  Had a tetanus July 2018 and pneumonia shot; in [place] at urgent care, [facility]  Depression screen [facility] 2/9 04/28/2018 02/23/2018 05/08/2016 04/10/2016  Decreased Interest 0 0 0 0  Down, Depressed, Hopeless 0 0 0 0  PHQ - 2 Score 0 0 0 0    Relevant past medical, surgical, family and social history reviewed Past Medical History:  Diagnosis Date  . A-fib  ([place])   . Alcohol abuse   . Alcohol use   . Aortic aneurysm ([place]) 06/19/2017   Ascending 4.4 cm, CT scan July 2018  . Aortic aneurysm, intrathoracic ([place])   . Ascending aortic aneurysm ([place])   . Ascending aortic aneurysm ([place])   . Atrial fibrillation ([place]) 05/08/2016  . Broken rib April 2016   History of broken ribs x 3  . Bronchitis   . CAD (coronary artery disease)   . Cataract 02/06/2018   left  . CHF (congestive heart failure) ([place])   . Chronic alcoholism ([place])   . Cirrhosis ([place])   . Closed nondisplaced fracture of fifth metatarsal bone of left foot Nov. 2016   left  . Complication of anesthesia    HAS REQUIRED MULTIPLE REINTUBATIONS AFTER GEN ANESTHESIA. NO PROBLEM WITH MAC/ DID WELL WITH PREVIOUS CATARACT  . COPD (chronic obstructive pulmonary disease) ([place])   . Depression   . Diabetes mellitus without complication ([place])   . Disc disorder of cervical region    COMPRESSED CERVIVAL VERTEBRAE/ TRAUMATIC INJURY/ DIFFICULTY SWALLOWING  . Dysrhythmia   . Emphysema of lung ([place])   . Esophageal rupture   . Femur fracture ([facility])    tight  . GERD (gastroesophageal reflux  disease)   . Hepatitis C    HEP B IN 1970'S  . Hip arthritis   . History of diverticulitis    ruptured and sepsis  . History of hiatal hernia   . History of smoking   . HOH (hard of hearing)   . Hyperlipidemia   . Hypertension   . Hypoxemia   . Insomnia   . Movement disorder   . Onychogryphosis   . Oxygen deficiency    3L/HS  . Pneumonia   . Rib fracture   . Seasonal allergies   . Shortness of breath dyspnea   . Sleep apnea    uses O2 _0  L/min at night  . Stroke ([place])    X 4  . Thrombocytopenia ([place])    Past Surgical History:  Procedure Laterality Date  . APPENDECTOMY    . CATARACT EXTRACTION W/PHACO Right 01/16/2016   Procedure: CATARACT EXTRACTION PHACO AND INTRAOCULAR LENS PLACEMENT (IOC);  Surgeon: Birder Robson, MD;  Location: [facility] ORS;  Service: Ophthalmology;  Laterality: Right;  [place]:  00:45.0 AP%: 25.2 CDE: 11.36  Lot #[id number]  . CATARACT EXTRACTION W/PHACO Left 03/25/2018   Procedure: CATARACT EXTRACTION PHACO AND INTRAOCULAR LENS PLACEMENT ([place]);  Surgeon: Birder Robson, MD;  Location: [facility] ORS;  Service: Ophthalmology;  Laterality: Left;  [place]  01:21 AP%17.6 CDE14.31 Fluid pack lot # [id number]  . CHOLECYSTECTOMY    . COLON RESECTION    . COLON SURGERY    . COLONOSCOPY WITH PROPOFOL N/A 08/27/2016   Procedure: COLONOSCOPY WITH PROPOFOL;  Surgeon: Lollie Sails, MD;  Location: [facility] ENDOSCOPY;  Service: Endoscopy;  Laterality: N/A;  . ELECTROPHYSIOLOGIC STUDY N/A 05/22/2016   Procedure: CARDIOVERSION;  Surgeon: Teodoro Spray, MD;  Location: [facility] ORS;  Service: Cardiovascular;  Laterality: N/A;  . FRACTURE SURGERY Right    SHOULDER  . HERNIA REPAIR    . INTRAMEDULLARY (IM) NAIL INTERTROCHANTERIC Right 09/16/2017   Procedure: INTRAMEDULLARY (IM) NAIL INTERTROCHANTRIC;  Surgeon: Thornton Park, MD;  Location: [facility] ORS;  Service: Orthopedics;  Laterality: Right;  . PEG PLACEMENT N/A   . PEG TUBE REMOVAL N/A   . PILONIDAL CYST EXCISION N/A   . TRACHEOSTOMY N/A    Family History  Problem Relation Age of Onset  . Coronary artery disease Mother   . Hypertension Mother   . Heart disease Mother   . Lung disease Mother   . Coronary artery disease Father   . Hypertension Father   . Diabetes Father   . Heart disease Father   . Cancer Father        prostate  . Alcohol abuse Brother    Social History   Tobacco Use  . Smoking status: Former Smoker    Packs/day: 0.25    Years: 40.00    Pack years: 10.00    Types: Cigarettes  . Smokeless tobacco: Never Used  Substance Use Topics  . Alcohol use: Yes    Comment: pint of vodka a week  . Drug use: No    Interim medical history since last visit reviewed. Allergies and medications reviewed  Review of Systems Per HPI unless specifically indicated above     Objective:    BP 136/64   Pulse 71   Temp  97.9 F (36.6 C) (Oral)   Resp 16   Ht _1  (1.778 m)   Wt 235 lb 3.2 oz (106.7 kg)   SpO2 94%   BMI 33.75 kg/m   Wt Readings from Last 3 Encounters:  04/28/18 235  lb 3.2 oz (106.7 kg)  03/11/18 226 lb (102.5 kg)  02/23/18 226 lb 11.2 oz (102.8 kg)    Physical Exam  Constitutional: He appears well-developed and well-nourished. No distress.  Eyes: No scleral icterus.  Cardiovascular: Normal rate.  Pulmonary/Chest: Effort normal and breath sounds normal.  Neurological: He is alert.  Skin: No pallor.     Open epithelialized tunnel on the medial RIGHT buttock; no overt drainage; no foul smell; unable to express any discharge; no erythema around the wound  Psychiatric: He has a normal mood and affect.   Diabetic Foot Form - Detailed   Diabetic Foot Exam - detailed Diabetic Foot exam was performed with the following findings:  Yes 04/28/2018  1:50 PM  Visual Foot Exam completed.:  Yes  Pulse Foot Exam completed.:  Yes  Right Dorsalis Pedis:  Present Left Dorsalis Pedis:  Present  Sensory Foot Exam Completed.:  Yes Semmes-Weinstein Monofilament Test R Site 1-Great Toe:  Pos L Site 1-Great Toe:  Pos        Results for orders placed or performed during the hospital encounter of 03/25/18  Glucose, capillary  Result Value Ref Range   Glucose-Capillary 104 (H) 65 - 99 mg/dL      Assessment & Plan:   Problem List Items Addressed This Visit      Cardiovascular and Mediastinum   CAD (coronary artery disease)    Left main and 3 vessel CAD on scan; patient is asymptomatic, seeing cardiologist      Aortic aneurysm ([place])    Managed by Dr. Ubaldo Glassing, cardiologist        Respiratory   Emphysema of lung [facility])    Managed by pulmonologist        Endocrine   Type II diabetes mellitus, uncontrolled ([place])    Managed by endocrinologist at [facility]       Other Visit Diagnoses    Wound of right buttock, subsequent encounter    -  Primary   refer to wound clinic; no indication  today for antibiotics   Relevant Orders   Ambulatory referral to Pain Clinic       Follow up plan: No follow-ups on file.  An after-visit summary was printed and given to the patient at [facility].  Please see the patient instructions which may contain other information and recommendations beyond what is mentioned above in the assessment and plan.  No orders of the defined types were placed in this encounter.   Orders Placed This Encounter  Procedures  . Ambulatory referral to Pain Clinic

## 2018-04-28 NOTE — Assessment & Plan Note (Signed)
Managed by endocrinologist at [facility]

## 2018-04-28 NOTE — Assessment & Plan Note (Signed)
Managed by Dr. Ubaldo Glassing, cardiologist

## 2018-04-28 NOTE — Assessment & Plan Note (Signed)
Left main and 3 vessel CAD on scan; patient is asymptomatic, seeing cardiologist

## 2018-05-08 ENCOUNTER — Ambulatory Visit: Payer: Medicare Other | Admitting: Physician Assistant

## 2018-06-15 ENCOUNTER — Ambulatory Visit
Admission: RE | Admit: 2018-06-15 | Discharge: 2018-06-15 | Disposition: A | Discharge status: Home / Self Care | Payer: Medicare Other | Source: Ambulatory Visit | Attending: Specialist | Admitting: Specialist

## 2018-06-15 DIAGNOSIS — I700 Atherosclerosis of aorta: Secondary | ICD-10-CM | POA: Insufficient documentation

## 2018-06-15 DIAGNOSIS — I712 Thoracic aortic aneurysm, without rupture, unspecified: Secondary | ICD-10-CM

## 2018-06-15 DIAGNOSIS — R590 Localized enlarged lymph nodes: Secondary | ICD-10-CM | POA: Diagnosis not present

## 2018-06-15 DIAGNOSIS — J439 Emphysema, unspecified: Secondary | ICD-10-CM | POA: Insufficient documentation

## 2018-06-15 DIAGNOSIS — R0609 Other forms of dyspnea: Secondary | ICD-10-CM

## 2018-06-15 DIAGNOSIS — R918 Other nonspecific abnormal finding of lung field: Secondary | ICD-10-CM | POA: Diagnosis not present

## 2018-06-15 DIAGNOSIS — I2510 Atherosclerotic heart disease of native coronary artery without angina pectoris: Secondary | ICD-10-CM | POA: Insufficient documentation

## 2018-06-15 LAB — POCT I-STAT CREATININE: CREATININE: 0.8 mg/dL (ref 0.61–1.24)

## 2018-06-15 MED ORDER — IOPAMIDOL (ISOVUE-370) INJECTION 76%
75.0000 mL | Freq: Once | INTRAVENOUS | Status: AC | PRN
  Administered 2018-06-15: 75 mL via INTRAVENOUS

## 2018-08-05 DIAGNOSIS — E1159 Type 2 diabetes mellitus with other circulatory complications: Secondary | ICD-10-CM | POA: Diagnosis not present

## 2018-08-05 DIAGNOSIS — E1169 Type 2 diabetes mellitus with other specified complication: Secondary | ICD-10-CM | POA: Diagnosis not present

## 2018-08-05 DIAGNOSIS — I10 Essential (primary) hypertension: Secondary | ICD-10-CM | POA: Diagnosis not present

## 2018-08-05 DIAGNOSIS — Z794 Long term (current) use of insulin: Secondary | ICD-10-CM | POA: Diagnosis not present

## 2018-08-05 DIAGNOSIS — E785 Hyperlipidemia, unspecified: Secondary | ICD-10-CM | POA: Diagnosis not present

## 2018-08-05 DIAGNOSIS — E1142 Type 2 diabetes mellitus with diabetic polyneuropathy: Secondary | ICD-10-CM | POA: Diagnosis not present

## 2018-09-02 DIAGNOSIS — R042 Hemoptysis: Secondary | ICD-10-CM | POA: Diagnosis not present

## 2018-09-02 DIAGNOSIS — I481 Persistent atrial fibrillation: Secondary | ICD-10-CM | POA: Diagnosis not present

## 2018-09-02 DIAGNOSIS — I2510 Atherosclerotic heart disease of native coronary artery without angina pectoris: Secondary | ICD-10-CM | POA: Diagnosis not present

## 2018-09-02 DIAGNOSIS — E785 Hyperlipidemia, unspecified: Secondary | ICD-10-CM | POA: Diagnosis not present

## 2018-09-02 DIAGNOSIS — J431 Panlobular emphysema: Secondary | ICD-10-CM | POA: Diagnosis not present

## 2018-09-02 DIAGNOSIS — E1169 Type 2 diabetes mellitus with other specified complication: Secondary | ICD-10-CM | POA: Diagnosis not present

## 2018-09-02 DIAGNOSIS — I10 Essential (primary) hypertension: Secondary | ICD-10-CM | POA: Diagnosis not present

## 2018-09-02 DIAGNOSIS — E1159 Type 2 diabetes mellitus with other circulatory complications: Secondary | ICD-10-CM | POA: Diagnosis not present

## 2018-09-02 DIAGNOSIS — Z720 Tobacco use: Secondary | ICD-10-CM | POA: Diagnosis not present

## 2018-09-04 DIAGNOSIS — E1169 Type 2 diabetes mellitus with other specified complication: Secondary | ICD-10-CM | POA: Diagnosis not present

## 2018-09-04 DIAGNOSIS — E785 Hyperlipidemia, unspecified: Secondary | ICD-10-CM | POA: Diagnosis not present

## 2018-09-22 DIAGNOSIS — R05 Cough: Secondary | ICD-10-CM | POA: Diagnosis not present

## 2018-09-22 DIAGNOSIS — J431 Panlobular emphysema: Secondary | ICD-10-CM | POA: Diagnosis not present

## 2018-09-22 DIAGNOSIS — J3800 Paralysis of vocal cords and larynx, unspecified: Secondary | ICD-10-CM | POA: Diagnosis not present

## 2018-09-29 DIAGNOSIS — Z23 Encounter for immunization: Secondary | ICD-10-CM | POA: Diagnosis not present

## 2018-10-09 DIAGNOSIS — J181 Lobar pneumonia, unspecified organism: Secondary | ICD-10-CM | POA: Diagnosis not present

## 2018-10-09 DIAGNOSIS — J431 Panlobular emphysema: Secondary | ICD-10-CM | POA: Diagnosis not present

## 2018-10-09 DIAGNOSIS — R918 Other nonspecific abnormal finding of lung field: Secondary | ICD-10-CM | POA: Diagnosis not present

## 2018-10-09 DIAGNOSIS — R0602 Shortness of breath: Secondary | ICD-10-CM | POA: Diagnosis not present

## 2018-10-09 DIAGNOSIS — R0609 Other forms of dyspnea: Secondary | ICD-10-CM | POA: Diagnosis not present

## 2018-10-20 DIAGNOSIS — R0609 Other forms of dyspnea: Secondary | ICD-10-CM | POA: Diagnosis not present

## 2018-10-21 DIAGNOSIS — R918 Other nonspecific abnormal finding of lung field: Secondary | ICD-10-CM | POA: Diagnosis not present

## 2018-10-21 DIAGNOSIS — R0609 Other forms of dyspnea: Secondary | ICD-10-CM | POA: Diagnosis not present

## 2018-10-21 DIAGNOSIS — R05 Cough: Secondary | ICD-10-CM | POA: Diagnosis not present

## 2018-10-22 ENCOUNTER — Other Ambulatory Visit: Payer: Self-pay | Admitting: Specialist

## 2018-10-22 DIAGNOSIS — R918 Other nonspecific abnormal finding of lung field: Secondary | ICD-10-CM

## 2018-10-22 DIAGNOSIS — R0609 Other forms of dyspnea: Principal | ICD-10-CM

## 2018-10-22 DIAGNOSIS — R053 Chronic cough: Secondary | ICD-10-CM

## 2018-10-22 DIAGNOSIS — R05 Cough: Secondary | ICD-10-CM

## 2018-10-29 ENCOUNTER — Ambulatory Visit
Admission: RE | Admit: 2018-10-29 | Discharge: 2018-10-29 | Disposition: A | Discharge status: Home / Self Care | Payer: Medicare Other | Source: Ambulatory Visit | Attending: Specialist | Admitting: Specialist

## 2018-10-29 DIAGNOSIS — R918 Other nonspecific abnormal finding of lung field: Secondary | ICD-10-CM

## 2018-10-29 DIAGNOSIS — R0609 Other forms of dyspnea: Secondary | ICD-10-CM | POA: Diagnosis present

## 2018-10-29 DIAGNOSIS — I712 Thoracic aortic aneurysm, without rupture: Secondary | ICD-10-CM | POA: Insufficient documentation

## 2018-10-29 DIAGNOSIS — J439 Emphysema, unspecified: Secondary | ICD-10-CM | POA: Diagnosis not present

## 2018-10-29 DIAGNOSIS — I2510 Atherosclerotic heart disease of native coronary artery without angina pectoris: Secondary | ICD-10-CM | POA: Diagnosis not present

## 2018-10-29 DIAGNOSIS — D3A090 Benign carcinoid tumor of the bronchus and lung: Secondary | ICD-10-CM | POA: Insufficient documentation

## 2018-10-29 DIAGNOSIS — R053 Chronic cough: Secondary | ICD-10-CM

## 2018-10-29 DIAGNOSIS — R05 Cough: Secondary | ICD-10-CM

## 2018-10-29 DIAGNOSIS — J189 Pneumonia, unspecified organism: Secondary | ICD-10-CM | POA: Diagnosis not present

## 2018-11-03 DIAGNOSIS — J431 Panlobular emphysema: Secondary | ICD-10-CM | POA: Diagnosis not present

## 2018-11-03 DIAGNOSIS — R0602 Shortness of breath: Secondary | ICD-10-CM | POA: Diagnosis not present

## 2018-11-03 DIAGNOSIS — R918 Other nonspecific abnormal finding of lung field: Secondary | ICD-10-CM | POA: Diagnosis not present

## 2018-11-03 DIAGNOSIS — R05 Cough: Secondary | ICD-10-CM | POA: Diagnosis not present

## 2018-11-15 ENCOUNTER — Inpatient Hospital Stay
Admission: EM | Admit: 2018-11-15 | Discharge: 2018-12-09 | DRG: 208 | Disposition: E | Payer: Medicare Other | Attending: Internal Medicine | Admitting: Internal Medicine

## 2018-11-15 ENCOUNTER — Emergency Department: Payer: Medicare Other

## 2018-11-15 ENCOUNTER — Encounter: Payer: Self-pay | Admitting: Emergency Medicine

## 2018-11-15 ENCOUNTER — Other Ambulatory Visit: Payer: Self-pay

## 2018-11-15 ENCOUNTER — Inpatient Hospital Stay: Payer: Medicare Other

## 2018-11-15 DIAGNOSIS — Z79899 Other long term (current) drug therapy: Secondary | ICD-10-CM | POA: Diagnosis not present

## 2018-11-15 DIAGNOSIS — J189 Pneumonia, unspecified organism: Secondary | ICD-10-CM | POA: Diagnosis not present

## 2018-11-15 DIAGNOSIS — I5023 Acute on chronic systolic (congestive) heart failure: Secondary | ICD-10-CM | POA: Diagnosis present

## 2018-11-15 DIAGNOSIS — Z8673 Personal history of transient ischemic attack (TIA), and cerebral infarction without residual deficits: Secondary | ICD-10-CM

## 2018-11-15 DIAGNOSIS — J439 Emphysema, unspecified: Secondary | ICD-10-CM | POA: Diagnosis not present

## 2018-11-15 DIAGNOSIS — J9601 Acute respiratory failure with hypoxia: Secondary | ICD-10-CM | POA: Diagnosis not present

## 2018-11-15 DIAGNOSIS — I959 Hypotension, unspecified: Secondary | ICD-10-CM | POA: Diagnosis present

## 2018-11-15 DIAGNOSIS — J441 Chronic obstructive pulmonary disease with (acute) exacerbation: Principal | ICD-10-CM | POA: Diagnosis present

## 2018-11-15 DIAGNOSIS — J9602 Acute respiratory failure with hypercapnia: Secondary | ICD-10-CM

## 2018-11-15 DIAGNOSIS — Z515 Encounter for palliative care: Secondary | ICD-10-CM | POA: Diagnosis not present

## 2018-11-15 DIAGNOSIS — G4730 Sleep apnea, unspecified: Secondary | ICD-10-CM | POA: Diagnosis present

## 2018-11-15 DIAGNOSIS — Z811 Family history of alcohol abuse and dependence: Secondary | ICD-10-CM | POA: Diagnosis not present

## 2018-11-15 DIAGNOSIS — I482 Chronic atrial fibrillation, unspecified: Secondary | ICD-10-CM | POA: Diagnosis present

## 2018-11-15 DIAGNOSIS — K219 Gastro-esophageal reflux disease without esophagitis: Secondary | ICD-10-CM | POA: Diagnosis present

## 2018-11-15 DIAGNOSIS — Z4682 Encounter for fitting and adjustment of non-vascular catheter: Secondary | ICD-10-CM | POA: Diagnosis not present

## 2018-11-15 DIAGNOSIS — I2510 Atherosclerotic heart disease of native coronary artery without angina pectoris: Secondary | ICD-10-CM | POA: Diagnosis present

## 2018-11-15 DIAGNOSIS — J9622 Acute and chronic respiratory failure with hypercapnia: Secondary | ICD-10-CM | POA: Diagnosis present

## 2018-11-15 DIAGNOSIS — J9600 Acute respiratory failure, unspecified whether with hypoxia or hypercapnia: Secondary | ICD-10-CM

## 2018-11-15 DIAGNOSIS — Z8701 Personal history of pneumonia (recurrent): Secondary | ICD-10-CM

## 2018-11-15 DIAGNOSIS — Z66 Do not resuscitate: Secondary | ICD-10-CM | POA: Diagnosis not present

## 2018-11-15 DIAGNOSIS — E119 Type 2 diabetes mellitus without complications: Secondary | ICD-10-CM | POA: Diagnosis present

## 2018-11-15 DIAGNOSIS — K7460 Unspecified cirrhosis of liver: Secondary | ICD-10-CM | POA: Diagnosis present

## 2018-11-15 DIAGNOSIS — I110 Hypertensive heart disease with heart failure: Secondary | ICD-10-CM | POA: Diagnosis present

## 2018-11-15 DIAGNOSIS — E785 Hyperlipidemia, unspecified: Secondary | ICD-10-CM | POA: Diagnosis present

## 2018-11-15 DIAGNOSIS — H9190 Unspecified hearing loss, unspecified ear: Secondary | ICD-10-CM | POA: Diagnosis present

## 2018-11-15 DIAGNOSIS — Z9981 Dependence on supplemental oxygen: Secondary | ICD-10-CM

## 2018-11-15 DIAGNOSIS — Z833 Family history of diabetes mellitus: Secondary | ICD-10-CM

## 2018-11-15 DIAGNOSIS — J690 Pneumonitis due to inhalation of food and vomit: Secondary | ICD-10-CM | POA: Diagnosis present

## 2018-11-15 DIAGNOSIS — I712 Thoracic aortic aneurysm, without rupture: Secondary | ICD-10-CM | POA: Diagnosis present

## 2018-11-15 DIAGNOSIS — J9690 Respiratory failure, unspecified, unspecified whether with hypoxia or hypercapnia: Secondary | ICD-10-CM | POA: Diagnosis not present

## 2018-11-15 DIAGNOSIS — Z7951 Long term (current) use of inhaled steroids: Secondary | ICD-10-CM | POA: Diagnosis not present

## 2018-11-15 DIAGNOSIS — Z7189 Other specified counseling: Secondary | ICD-10-CM | POA: Diagnosis not present

## 2018-11-15 DIAGNOSIS — Z87891 Personal history of nicotine dependence: Secondary | ICD-10-CM | POA: Diagnosis not present

## 2018-11-15 DIAGNOSIS — F329 Major depressive disorder, single episode, unspecified: Secondary | ICD-10-CM | POA: Diagnosis present

## 2018-11-15 DIAGNOSIS — F10231 Alcohol dependence with withdrawal delirium: Secondary | ICD-10-CM | POA: Diagnosis present

## 2018-11-15 DIAGNOSIS — R0602 Shortness of breath: Secondary | ICD-10-CM | POA: Diagnosis present

## 2018-11-15 DIAGNOSIS — N179 Acute kidney failure, unspecified: Secondary | ICD-10-CM | POA: Diagnosis not present

## 2018-11-15 DIAGNOSIS — E872 Acidosis, unspecified: Secondary | ICD-10-CM

## 2018-11-15 DIAGNOSIS — Z794 Long term (current) use of insulin: Secondary | ICD-10-CM | POA: Diagnosis not present

## 2018-11-15 DIAGNOSIS — I4891 Unspecified atrial fibrillation: Secondary | ICD-10-CM | POA: Diagnosis not present

## 2018-11-15 DIAGNOSIS — F10239 Alcohol dependence with withdrawal, unspecified: Secondary | ICD-10-CM | POA: Diagnosis not present

## 2018-11-15 DIAGNOSIS — J9621 Acute and chronic respiratory failure with hypoxia: Secondary | ICD-10-CM | POA: Diagnosis present

## 2018-11-15 DIAGNOSIS — J90 Pleural effusion, not elsewhere classified: Secondary | ICD-10-CM | POA: Diagnosis not present

## 2018-11-15 DIAGNOSIS — R918 Other nonspecific abnormal finding of lung field: Secondary | ICD-10-CM | POA: Diagnosis not present

## 2018-11-15 DIAGNOSIS — F10939 Alcohol use, unspecified with withdrawal, unspecified: Secondary | ICD-10-CM

## 2018-11-15 DIAGNOSIS — R0603 Acute respiratory distress: Secondary | ICD-10-CM | POA: Diagnosis not present

## 2018-11-15 DIAGNOSIS — J449 Chronic obstructive pulmonary disease, unspecified: Secondary | ICD-10-CM | POA: Diagnosis not present

## 2018-11-15 DIAGNOSIS — J9620 Acute and chronic respiratory failure, unspecified whether with hypoxia or hypercapnia: Secondary | ICD-10-CM | POA: Diagnosis present

## 2018-11-15 LAB — COMPREHENSIVE METABOLIC PANEL
ALBUMIN: 3.5 g/dL (ref 3.5–5.0)
ALK PHOS: 105 U/L (ref 38–126)
ALT: 67 U/L — ABNORMAL HIGH (ref 0–44)
ANION GAP: 12 (ref 5–15)
AST: 71 U/L — ABNORMAL HIGH (ref 15–41)
BUN: 22 mg/dL (ref 8–23)
CALCIUM: 8.7 mg/dL — AB (ref 8.9–10.3)
CO2: 27 mmol/L (ref 22–32)
Chloride: 101 mmol/L (ref 98–111)
Creatinine, Ser: 0.84 mg/dL (ref 0.61–1.24)
GFR calc Af Amer: >60 mL/min (ref >60)
GFR calc non Af Amer: >60 mL/min (ref >60)
GLUCOSE: 179 mg/dL — AB (ref 70–99)
POTASSIUM: 4.4 mmol/L (ref 3.5–5.1)
SODIUM: 140 mmol/L (ref 135–145)
Total Bilirubin: 0.9 mg/dL (ref 0.3–1.2)
Total Protein: 7.8 g/dL (ref 6.5–8.1)

## 2018-11-15 LAB — BLOOD GAS, ARTERIAL
Acid-base deficit: 2.6 mmol/L — ABNORMAL HIGH (ref 0.0–2.0)
Acid-base deficit: 0.2 mmol/L (ref 0.0–2.0)
Allens test (pass/fail): POSITIVE — AB
Bicarbonate: 26.8 mmol/L (ref 20.0–28.0)
Bicarbonate: 25.9 mmol/L (ref 20.0–28.0)
FIO2: 1
FIO2: 50
MECHVT: 500 mL
MECHVT: 500 mL
Mechanical Rate: 20
O2 Saturation: 99.7 %
O2 Saturation: 94.5 %
PATIENT TEMPERATURE: 37
PEEP: 5 cmH2O
PEEP: 5 cmH2O
PO2 ART: 225 mmHg — AB (ref 83.0–108.0)
Patient temperature: 37
RATE: 20 resp/min
pCO2 arterial: 67 mmHg (ref 32.0–48.0)
pCO2 arterial: 47 mmHg (ref 32.0–48.0)
pH, Arterial: 7.21 — ABNORMAL LOW (ref 7.350–7.450)
pH, Arterial: 7.35 (ref 7.350–7.450)
pO2, Arterial: 77 mmHg — ABNORMAL LOW (ref 83.0–108.0)

## 2018-11-15 LAB — INFLUENZA PANEL BY PCR (TYPE A & B)
INFLAPCR: NEGATIVE
INFLBPCR: NEGATIVE

## 2018-11-15 LAB — CBC WITH DIFFERENTIAL/PLATELET
Abs Immature Granulocytes: 0.05 10*3/uL (ref 0.00–0.07)
BASOS ABS: 0.1 10*3/uL (ref 0.0–0.1)
Basophils Relative: 1 %
Eosinophils Absolute: 0.4 10*3/uL (ref 0.0–0.5)
Eosinophils Relative: 3 %
HCT: 42.7 % (ref 39.0–52.0)
HEMOGLOBIN: 13.3 g/dL (ref 13.0–17.0)
IMMATURE GRANULOCYTES: 1 %
LYMPHS PCT: 12 %
Lymphs Abs: 1.3 10*3/uL (ref 0.7–4.0)
MCH: 29.5 pg (ref 26.0–34.0)
MCHC: 31.1 g/dL (ref 30.0–36.0)
MCV: 94.7 fL (ref 80.0–100.0)
Monocytes Absolute: 1.1 10*3/uL — ABNORMAL HIGH (ref 0.1–1.0)
Monocytes Relative: 10 %
NEUTROS ABS: 7.5 10*3/uL (ref 1.7–7.7)
NEUTROS PCT: 73 %
Platelets: 156 10*3/uL (ref 150–400)
RBC: 4.51 MIL/uL (ref 4.22–5.81)
RDW: 13 % (ref 11.5–15.5)
WBC: 10.3 10*3/uL (ref 4.0–10.5)
nRBC: 0 % (ref 0.0–0.2)

## 2018-11-15 LAB — TROPONIN I: Troponin I: <0.03 ng/mL (ref <0.03)

## 2018-11-15 LAB — PROTIME-INR
INR: 1.01
Prothrombin Time: 13.2 seconds (ref 11.4–15.2)

## 2018-11-15 LAB — GLUCOSE, CAPILLARY
Glucose-Capillary: 218 mg/dL — ABNORMAL HIGH (ref 70–99)
Glucose-Capillary: 201 mg/dL — ABNORMAL HIGH (ref 70–99)
Glucose-Capillary: 215 mg/dL — ABNORMAL HIGH (ref 70–99)
Glucose-Capillary: 189 mg/dL — ABNORMAL HIGH (ref 70–99)

## 2018-11-15 LAB — APTT: aPTT: 30 seconds (ref 24–36)

## 2018-11-15 LAB — PHOSPHORUS: Phosphorus: 4.3 mg/dL (ref 2.5–4.6)

## 2018-11-15 LAB — MAGNESIUM: MAGNESIUM: 1.7 mg/dL (ref 1.7–2.4)

## 2018-11-15 LAB — MRSA PCR SCREENING: MRSA by PCR: NEGATIVE

## 2018-11-15 LAB — BRAIN NATRIURETIC PEPTIDE: B Natriuretic Peptide: 1211 pg/mL — ABNORMAL HIGH (ref 0.0–100.0)

## 2018-11-15 LAB — LACTIC ACID, PLASMA: Lactic Acid, Venous: 2.2 mmol/L (ref 0.5–1.9)

## 2018-11-15 LAB — PROCALCITONIN: Procalcitonin: 0.1 ng/mL

## 2018-11-15 MED ORDER — SODIUM CHLORIDE 0.9 % IV SOLN
Freq: Once | INTRAVENOUS | Status: AC
  Administered 2018-11-15: 07:00:00 via INTRAVENOUS

## 2018-11-15 MED ORDER — HYDROMORPHONE HCL 1 MG/ML IJ SOLN
INTRAMUSCULAR | Status: AC
  Administered 2018-11-15: 2 mg
  Filled 2018-11-15: qty 2

## 2018-11-15 MED ORDER — ATROPINE SULFATE 1 MG/ML IJ SOLN
INTRAMUSCULAR | Status: AC | PRN
  Administered 2018-11-15: .5 mg via INTRAVENOUS

## 2018-11-15 MED ORDER — METHYLPREDNISOLONE SODIUM SUCC 125 MG IJ SOLR
60.0000 mg | INTRAMUSCULAR | Status: DC
  Administered 2018-11-16 – 2018-11-18 (×3): 60 mg via INTRAVENOUS
  Filled 2018-11-15 (×2): qty 2

## 2018-11-15 MED ORDER — FENTANYL CITRATE (PF) 100 MCG/2ML IJ SOLN
INTRAMUSCULAR | Status: AC | PRN
  Administered 2018-11-15: 150 ug via INTRAVENOUS

## 2018-11-15 MED ORDER — FENTANYL CITRATE (PF) 100 MCG/2ML IJ SOLN
250.0000 ug | Freq: Once | INTRAMUSCULAR | Status: AC
  Administered 2018-11-15: 150 ug via INTRAVENOUS

## 2018-11-15 MED ORDER — CHLORHEXIDINE GLUCONATE 0.12% ORAL RINSE (MEDLINE KIT)
15.0000 mL | Freq: Two times a day (BID) | OROMUCOSAL | Status: DC
  Administered 2018-11-15 – 2018-11-19 (×8): 15 mL via OROMUCOSAL

## 2018-11-15 MED ORDER — INSULIN ASPART 100 UNIT/ML ~~LOC~~ SOLN
0.0000 [IU] | SUBCUTANEOUS | Status: DC
  Administered 2018-11-16 (×4): 3 [IU] via SUBCUTANEOUS
  Administered 2018-11-17 (×2): 8 [IU] via SUBCUTANEOUS
  Administered 2018-11-17: 3 [IU] via SUBCUTANEOUS
  Administered 2018-11-17: 8 [IU] via SUBCUTANEOUS
  Administered 2018-11-17: 11 [IU] via SUBCUTANEOUS
  Administered 2018-11-17: 5 [IU] via SUBCUTANEOUS
  Administered 2018-11-17: 3 [IU] via SUBCUTANEOUS
  Administered 2018-11-18: 11 [IU] via SUBCUTANEOUS
  Administered 2018-11-18: 8 [IU] via SUBCUTANEOUS
  Administered 2018-11-18: 3 [IU] via SUBCUTANEOUS
  Administered 2018-11-18: 2 [IU] via SUBCUTANEOUS
  Administered 2018-11-18: 3 [IU] via SUBCUTANEOUS
  Administered 2018-11-19: 2 [IU] via SUBCUTANEOUS
  Administered 2018-11-19 (×2): 3 [IU] via SUBCUTANEOUS
  Filled 2018-11-15 (×19): qty 1

## 2018-11-15 MED ORDER — ORAL CARE MOUTH RINSE
15.0000 mL | OROMUCOSAL | Status: DC
  Administered 2018-11-15 – 2018-11-18 (×27): 15 mL via OROMUCOSAL

## 2018-11-15 MED ORDER — SODIUM CHLORIDE 0.9 % IV SOLN
INTRAVENOUS | Status: DC
  Administered 2018-11-15 – 2018-11-17 (×6): via INTRAVENOUS
  Administered 2018-11-17: 100 mL/h via INTRAVENOUS
  Administered 2018-11-18: 21:00:00 via INTRAVENOUS
  Administered 2018-11-18: 100 mL/h via INTRAVENOUS
  Administered 2018-11-19 (×2): via INTRAVENOUS

## 2018-11-15 MED ORDER — SUCCINYLCHOLINE CHLORIDE 20 MG/ML IJ SOLN
200.0000 mg | Freq: Once | INTRAMUSCULAR | Status: AC
  Administered 2018-11-15: 200 mg via INTRAVENOUS

## 2018-11-15 MED ORDER — PROPOFOL 10 MG/ML IV BOLUS
80.0000 mg | Freq: Once | INTRAVENOUS | Status: AC
  Administered 2018-11-15: 80 mg via INTRAVENOUS

## 2018-11-15 MED ORDER — IPRATROPIUM-ALBUTEROL 0.5-2.5 (3) MG/3ML IN SOLN
3.0000 mL | Freq: Four times a day (QID) | RESPIRATORY_TRACT | Status: DC | PRN
  Administered 2018-11-17: 3 mL via RESPIRATORY_TRACT
  Filled 2018-11-15: qty 3

## 2018-11-15 MED ORDER — IPRATROPIUM-ALBUTEROL 0.5-2.5 (3) MG/3ML IN SOLN
3.0000 mL | Freq: Once | RESPIRATORY_TRACT | Status: AC
  Administered 2018-11-15: 3 mL via RESPIRATORY_TRACT
  Filled 2018-11-15 (×4): qty 3

## 2018-11-15 MED ORDER — ACETAMINOPHEN 650 MG RE SUPP: 650.0000 mg | Freq: Four times a day (QID) | RECTAL | Status: DC | PRN

## 2018-11-15 MED ORDER — NOREPINEPHRINE 4 MG/250ML-% IV SOLN: 10.0000 ug/min | INTRAVENOUS | Status: DC

## 2018-11-15 MED ORDER — NOREPINEPHRINE 4 MG/250ML-% IV SOLN
10.0000 ug/min | INTRAVENOUS | Status: DC
  Administered 2018-11-15: 5 ug/min via INTRAVENOUS

## 2018-11-15 MED ORDER — PIPERACILLIN-TAZOBACTAM 3.375 G IVPB 30 MIN
3.3750 g | Freq: Once | INTRAVENOUS | Status: AC
  Administered 2018-11-15: 3.375 g via INTRAVENOUS
  Filled 2018-11-15: qty 50

## 2018-11-15 MED ORDER — PIPERACILLIN-TAZOBACTAM 3.375 G IVPB
3.3750 g | Freq: Three times a day (TID) | INTRAVENOUS | Status: DC
  Administered 2018-11-15 – 2018-11-19 (×13): 3.375 g via INTRAVENOUS
  Filled 2018-11-15 (×13): qty 50

## 2018-11-15 MED ORDER — INSULIN ASPART 100 UNIT/ML ~~LOC~~ SOLN
0.0000 [IU] | Freq: Three times a day (TID) | SUBCUTANEOUS | Status: DC
  Administered 2018-11-15 (×2): 3 [IU] via SUBCUTANEOUS
  Filled 2018-11-15 (×2): qty 1

## 2018-11-15 MED ORDER — PROPOFOL 10 MG/ML IV BOLUS: 0.5000 mg/kg | Freq: Once | INTRAVENOUS | Status: DC

## 2018-11-15 MED ORDER — KETAMINE HCL 10 MG/ML IJ SOLN
200.0000 mg | Freq: Once | INTRAMUSCULAR | Status: AC
  Administered 2018-11-15: 200 mg via INTRAVENOUS

## 2018-11-15 MED ORDER — ACETAMINOPHEN 325 MG PO TABS: 650.0000 mg | ORAL_TABLET | Freq: Four times a day (QID) | ORAL | Status: DC | PRN

## 2018-11-15 MED ORDER — SOTALOL HCL 80 MG PO TABS
80.0000 mg | ORAL_TABLET | Freq: Two times a day (BID) | ORAL | Status: DC
  Administered 2018-11-16 – 2018-11-17 (×4): 80 mg via ORAL
  Filled 2018-11-15 (×8): qty 1

## 2018-11-15 MED ORDER — IOHEXOL 300 MG/ML  SOLN
75.0000 mL | Freq: Once | INTRAMUSCULAR | Status: AC | PRN
  Administered 2018-11-15: 75 mL via INTRAVENOUS

## 2018-11-15 MED ORDER — LORAZEPAM 2 MG/ML IJ SOLN
4.0000 mg | Freq: Once | INTRAMUSCULAR | Status: AC
  Administered 2018-11-15: 4 mg via INTRAVENOUS
  Filled 2018-11-15: qty 2

## 2018-11-15 MED ORDER — IPRATROPIUM-ALBUTEROL 0.5-2.5 (3) MG/3ML IN SOLN
3.0000 mL | Freq: Once | RESPIRATORY_TRACT | Status: AC
  Administered 2018-11-15: 3 mL via RESPIRATORY_TRACT
  Filled 2018-11-15: qty 3

## 2018-11-15 MED ORDER — MAGNESIUM SULFATE 2 GM/50ML IV SOLN
2.0000 g | Freq: Once | INTRAVENOUS | Status: AC
  Administered 2018-11-15: 2 g via INTRAVENOUS
  Filled 2018-11-15: qty 50

## 2018-11-15 MED ORDER — ONDANSETRON HCL 4 MG/2ML IJ SOLN: 4.0000 mg | Freq: Four times a day (QID) | INTRAMUSCULAR | Status: DC | PRN

## 2018-11-15 MED ORDER — HEPARIN SODIUM (PORCINE) 5000 UNIT/ML IJ SOLN
5000.0000 [IU] | Freq: Three times a day (TID) | INTRAMUSCULAR | Status: DC
  Administered 2018-11-15 – 2018-11-19 (×13): 5000 [IU] via SUBCUTANEOUS
  Filled 2018-11-15 (×13): qty 1

## 2018-11-15 MED ORDER — ONDANSETRON HCL 4 MG PO TABS: 4.0000 mg | ORAL_TABLET | Freq: Four times a day (QID) | ORAL | Status: DC | PRN

## 2018-11-15 MED ORDER — PROPRANOLOL HCL 1 MG/ML IV SOLN: 80.0000 mg | Freq: Once | INTRAVENOUS | Status: DC

## 2018-11-15 MED ORDER — SODIUM CHLORIDE 0.9 % IV SOLN
INTRAVENOUS | Status: AC | PRN
  Administered 2018-11-15: 1000 mL via INTRAVENOUS

## 2018-11-15 MED ORDER — PROPOFOL 1000 MG/100ML IV EMUL
5.0000 ug/kg/min | INTRAVENOUS | Status: DC
  Administered 2018-11-15: 30 ug/kg/min via INTRAVENOUS
  Administered 2018-11-15 (×2): 20 ug/kg/min via INTRAVENOUS
  Administered 2018-11-16: 35 ug/kg/min via INTRAVENOUS
  Administered 2018-11-16 (×2): 60 ug/kg/min via INTRAVENOUS
  Administered 2018-11-16: 30 ug/kg/min via INTRAVENOUS
  Administered 2018-11-16: 40 ug/kg/min via INTRAVENOUS
  Administered 2018-11-16: 60 ug/kg/min via INTRAVENOUS
  Administered 2018-11-16: 30 ug/kg/min via INTRAVENOUS
  Administered 2018-11-17: 20 ug/kg/min via INTRAVENOUS
  Administered 2018-11-17: 15 ug/kg/min via INTRAVENOUS
  Administered 2018-11-17: 30 ug/kg/min via INTRAVENOUS
  Administered 2018-11-17 (×2): 40 ug/kg/min via INTRAVENOUS
  Administered 2018-11-18: 20 ug/kg/min via INTRAVENOUS
  Filled 2018-11-15 (×14): qty 100

## 2018-11-15 MED ORDER — METHYLPREDNISOLONE SODIUM SUCC 125 MG IJ SOLR
125.0000 mg | Freq: Once | INTRAMUSCULAR | Status: AC
  Administered 2018-11-15: 125 mg via INTRAVENOUS
  Filled 2018-11-15: qty 2

## 2018-11-15 MED ORDER — PHENOBARBITAL SODIUM 65 MG/ML IJ SOLN
260.0000 mg | Freq: Once | INTRAMUSCULAR | Status: AC
  Administered 2018-11-15: 260 mg via INTRAVENOUS
  Filled 2018-11-15: qty 4

## 2018-11-15 MED ORDER — FENTANYL 2500MCG IN NS 250ML (10MCG/ML) PREMIX INFUSION
100.0000 ug/h | INTRAVENOUS | Status: DC
  Administered 2018-11-15 – 2018-11-18 (×5): 100 ug/h via INTRAVENOUS
  Filled 2018-11-15 (×5): qty 250

## 2018-11-15 MED ORDER — FUROSEMIDE 10 MG/ML IJ SOLN
40.0000 mg | Freq: Every day | INTRAMUSCULAR | Status: DC
  Administered 2018-11-15 – 2018-11-16 (×2): 40 mg via INTRAVENOUS
  Filled 2018-11-15 (×2): qty 4

## 2018-11-15 MED ORDER — PROPOFOL 1000 MG/100ML IV EMUL
INTRAVENOUS | Status: AC
  Administered 2018-11-15: 20 ug/kg/min via INTRAVENOUS
  Filled 2018-11-15: qty 100

## 2018-11-15 NOTE — Consult Note (Signed)
Pharmacy Antibiotic Note  Marvin Lewis is a 67 y.o. male admitted on 11/22/2018 with aspiration pneumonia.  Pharmacy has been consulted for Zosyn dosing.  Plan: Zosyn 3.375g IV q8h (4 hour infusion).  Height: 5\' 9"  (175.3 cm) Weight: 235 lb (106.6 kg) IBW/kg (Calculated) : 70.7  No data recorded.  Recent Labs  Lab 11/23/2018 0618 12/06/2018 0625  WBC 10.3  --   CREATININE 0.84  --   LATICACIDVEN  --  2.2*    Estimated Creatinine Clearance: 104.1 mL/min (by C-G formula based on SCr of 0.84 mg/dL).    Antimicrobials this admission: Zosyn 12/8 >>   Microbiology results: 12/8 BCx: pending  Thank you for allowing pharmacy to be a part of this patient's care.  Dallie Piles, PharmD 12/04/2018 9:07 AM

## 2018-11-15 NOTE — Consult Note (Signed)
Reason for Consult: Respiratory failure requiring intubation  referring Physician: Dr. Silvio Clayman is an 67 y.o. male.  HPI: Mr. Deines is a 67 year old gentleman with a past medical history remarkable for severe COPD on home oxygen therapy being followed by Dr. Raul Del, a sending aortic aneurysm, atrial fibrillation, not on anticoagulation secondary to hemoptysis, coronary artery disease, cirrhosis, pretension, hyperlipidemia, CVA, chronic alcoholism has been treated for recurrent outpatient pneumonia by Dr. Raul Del.  Per wife patient has a narrowed right bronchus from a hilar mass.  Process has been considered for stenting in the past.  She states that he developed progressive increasing shortness of breath prompting coming to the emergency room where he was found to be hypoxemic and hypercapnic.  He was also felt to be in withdrawal.  He subsequently was intubated placed on mechanical ventilation.  He had transient hypotension most likely secondary to positive pressure and sedation and was started briefly on norepinephrine  Past Medical History:  Diagnosis Date  . A-fib ([place])   . Alcohol abuse   . Alcohol use   . Aortic aneurysm ([place]) 06/19/2017   Ascending 4.4 cm, CT scan July 2018  . Aortic aneurysm, intrathoracic ([place])   . Ascending aortic aneurysm ([place])   . Ascending aortic aneurysm ([place])   . Atrial fibrillation ([place]) 05/08/2016  . Broken rib April 2016   History of broken ribs x 3  . Bronchitis   . CAD (coronary artery disease)   . Cataract 02/06/2018   left  . CHF (congestive heart failure) ([place])   . Chronic alcoholism ([place])   . Cirrhosis ([place])   . Closed nondisplaced fracture of fifth metatarsal bone of left foot Nov. 2016   left  . Complication of anesthesia    HAS REQUIRED MULTIPLE REINTUBATIONS AFTER GEN ANESTHESIA. NO PROBLEM WITH MAC/ DID WELL WITH PREVIOUS CATARACT  . COPD (chronic obstructive pulmonary disease) ([place])   . Depression   . Diabetes  mellitus without complication ([place])   . Disc disorder of cervical region    COMPRESSED CERVIVAL VERTEBRAE/ TRAUMATIC INJURY/ DIFFICULTY SWALLOWING  . Dysrhythmia   . Emphysema of lung ([place])   . Esophageal rupture   . Femur fracture ([facility])    tight  . GERD (gastroesophageal reflux disease)   . Hepatitis C    HEP B IN 1970'S  . Hip arthritis   . History of diverticulitis    ruptured and sepsis  . History of hiatal hernia   . History of smoking   . HOH (hard of hearing)   . Hyperlipidemia   . Hypertension   . Hypoxemia   . Insomnia   . Movement disorder   . Onychogryphosis   . Oxygen deficiency    3L/HS  . Pneumonia   . Rib fracture   . Seasonal allergies   . Shortness of breath dyspnea   . Sleep apnea    uses O2 @2  L/min at night  . Stroke ([place])    X 4  . Thrombocytopenia ([place])     Past Surgical History:  Procedure Laterality Date  . APPENDECTOMY    . CATARACT EXTRACTION W/PHACO Right 01/16/2016   Procedure: CATARACT EXTRACTION PHACO AND INTRAOCULAR LENS PLACEMENT (IOC);  Surgeon: Birder Robson, MD;  Location: [facility] ORS;  Service: Ophthalmology;  Laterality: Right;  [place]: 00:45.0 AP%: 25.2 CDE: 11.36  Lot #[id number]  . CATARACT EXTRACTION W/PHACO Left 03/25/2018   Procedure: CATARACT EXTRACTION PHACO AND INTRAOCULAR LENS PLACEMENT ([place]);  Surgeon: Birder Robson, MD;  Location: [facility] ORS;  Service: Ophthalmology;  Laterality: Left;  [place]  01:21 AP%17.6 CDE14.31 Fluid pack lot # [id number]  . CHOLECYSTECTOMY    . COLON RESECTION    . COLON SURGERY    . COLONOSCOPY WITH PROPOFOL N/A 08/27/2016   Procedure: COLONOSCOPY WITH PROPOFOL;  Surgeon: Lollie Sails, MD;  Location: [facility] ENDOSCOPY;  Service: Endoscopy;  Laterality: N/A;  . ELECTROPHYSIOLOGIC STUDY N/A 05/22/2016   Procedure: CARDIOVERSION;  Surgeon: Teodoro Spray, MD;  Location: [facility] ORS;  Service: Cardiovascular;  Laterality: N/A;  . FRACTURE SURGERY Right    SHOULDER  . HERNIA REPAIR    . INTRAMEDULLARY  (IM) NAIL INTERTROCHANTERIC Right 09/16/2017   Procedure: INTRAMEDULLARY (IM) NAIL INTERTROCHANTRIC;  Surgeon: Thornton Park, MD;  Location: [facility] ORS;  Service: Orthopedics;  Laterality: Right;  . PEG PLACEMENT N/A   . PEG TUBE REMOVAL N/A   . PILONIDAL CYST EXCISION N/A   . TRACHEOSTOMY N/A     Family History  Problem Relation Age of Onset  . Coronary artery disease Mother   . Hypertension Mother   . Heart disease Mother   . Lung disease Mother   . Coronary artery disease Father   . Hypertension Father   . Diabetes Father   . Heart disease Father   . Cancer Father        prostate  . Alcohol abuse Brother     Social History:  reports that he has quit smoking. His smoking use included cigarettes. He has a 10.00 pack-year smoking history. He has never used smokeless tobacco. He reports that he drinks alcohol. He reports that he does not use drugs.  Allergies: No Known Allergies  Medications: I have reviewed the patient's current medications.  Results for orders placed or performed during the hospital encounter of 11/08/2018 (from the past 48 hour(s))  Brain natriuretic peptide     Status: Abnormal   Collection Time: 11/26/2018  6:07 AM  Result Value Ref Range   B Natriuretic Peptide 1,211.0 (H) 0.0 - 100.0 pg/mL    Comment: Performed at [facility], [address], [place], [place] [address]  Protime-INR     Status: None   Collection Time: 11/26/2018  6:07 AM  Result Value Ref Range   Prothrombin Time 13.2 11.4 - 15.2 seconds   INR 1.01     Comment: Performed at [facility], [address] [place], [place] [address]  Magnesium     Status: None   Collection Time: 12/06/2018  6:07 AM  Result Value Ref Range   Magnesium 1.7 1.7 - 2.4 mg/dL    Comment: Performed at [facility], [address] [place], [place] [address]  Phosphorus     Status: None   Collection Time: 11/18/2018  6:07 AM  Result Value Ref Range   Phosphorus 4.3 2.5 - 4.6 mg/dL     Comment: Performed at [facility], [address] [place],  [address]  Comprehensive metabolic panel     Status: Abnormal   Collection Time: 11/18/2018  6:18 AM  Result Value Ref Range   Sodium 140 135 - 145 mmol/L   Potassium 4.4 3.5 - 5.1 mmol/L   Chloride 101 98 - 111 mmol/L   CO2 27 22 - 32 mmol/L   Glucose, Bld 179 (H) 70 - 99 mg/dL   BUN 22 8 - 23 mg/dL   Creatinine, Ser 0.84 0.61 - 1.24 mg/dL   Calcium 8.7 (L) 8.9 - 10.3 mg/dL  Total Protein 7.8 6.5 - 8.1 g/dL   Albumin 3.5 3.5 - 5.0 g/dL   AST 71 (H) 15 - 41 U/L   ALT 67 (H) 0 - 44 U/L   Alkaline Phosphatase 105 38 - 126 U/L   Total Bilirubin 0.9 0.3 - 1.2 mg/dL   GFR calc non Af Amer >60 >60 mL/min   GFR calc Af Amer >60 >60 mL/min   Anion gap 12 5 - 15    Comment: Performed at [facility], [address] [place], [place] [address]  Troponin I - Once     Status: None   Collection Time: 11/28/2018  6:18 AM  Result Value Ref Range   Troponin I <0.03 <0.03 ng/mL    Comment: Performed at [facility], [address] [place], [place] [address]  CBC with Differential     Status: Abnormal   Collection Time: 11/29/2018  6:18 AM  Result Value Ref Range   WBC 10.3 4.0 - 10.5 K/uL   RBC 4.51 4.22 - 5.81 MIL/uL   Hemoglobin 13.3 13.0 - 17.0 g/dL   HCT 42.7 39.0 - 52.0 %   MCV 94.7 80.0 - 100.0 fL   MCH 29.5 26.0 - 34.0 pg   MCHC 31.1 30.0 - 36.0 g/dL   RDW 13.0 11.5 - 15.5 %   Platelets 156 150 - 400 K/uL   nRBC 0.0 0.0 - 0.2 %   Neutrophils Relative % 73 %   Neutro Abs 7.5 1.7 - 7.7 K/uL   Lymphocytes Relative 12 %   Lymphs Abs 1.3 0.7 - 4.0 K/uL   Monocytes Relative 10 %   Monocytes Absolute 1.1 (H) 0.1 - 1.0 K/uL   Eosinophils Relative 3 %   Eosinophils Absolute 0.4 0.0 - 0.5 K/uL   Basophils Relative 1 %   Basophils Absolute 0.1 0.0 - 0.1 K/uL   Immature Granulocytes 1 %   Abs Immature Granulocytes 0.05 0.00 - 0.07 K/uL    Comment: Performed at [facility], [address] [place], [place] [address]  Influenza panel by PCR (type A & B)     Status: None   Collection Time: 11/09/2018  6:18 AM  Result Value Ref Range   Influenza A By PCR NEGATIVE NEGATIVE   Influenza B By PCR NEGATIVE NEGATIVE    Comment: (NOTE) The Xpert Xpress Flu assay is intended as an aid in the diagnosis of  influenza and should not be used as a sole basis for treatment.  This  assay is FDA approved for nasopharyngeal swab specimens only. Nasal  washings and aspirates are unacceptable for Xpert Xpress Flu testing. Performed at [facility], [address] [place], [place] [address]   APTT     Status: None   Collection Time: 11/30/2018  6:18 AM  Result Value Ref Range   aPTT 30 24 - 36 seconds    Comment: Performed at [facility], [address] [place], [place] [address]  Procalcitonin - Baseline     Status: None   Collection Time: 11/25/2018  6:18 AM  Result Value Ref Range   Procalcitonin 0.10 ng/mL    Comment:        Interpretation: PCT (Procalcitonin) <= 0.5 ng/mL: Systemic infection (sepsis) is not likely. Local bacterial infection is possible. (NOTE)       Sepsis PCT Algorithm           Lower Respiratory Tract  Infection PCT Algorithm    ----------------------------     ----------------------------         PCT < 0.25 ng/mL                PCT < 0.10 ng/mL         Strongly encourage             Strongly discourage   discontinuation of antibiotics    initiation of antibiotics    ----------------------------     -----------------------------       PCT 0.25 - 0.50 ng/mL            PCT 0.10 - 0.25 ng/mL               OR       >80% decrease in PCT            Discourage initiation of                                            antibiotics      Encourage discontinuation           of antibiotics    ----------------------------     -----------------------------         PCT >= 0.50 ng/mL              PCT 0.26 - 0.50  ng/mL               AND        <80% decrease in PCT             Encourage initiation of                                             antibiotics       Encourage continuation           of antibiotics    ----------------------------     -----------------------------        PCT >= 0.50 ng/mL                  PCT > 0.50 ng/mL               AND         increase in PCT                  Strongly encourage                                      initiation of antibiotics    Strongly encourage escalation           of antibiotics                                     -----------------------------                                           PCT <= 0.25 ng/mL  OR                                        > 80% decrease in PCT                                     Discontinue / Do not initiate                                             antibiotics Performed at [facility] [address] [place], [place] [address]   Lactic acid, plasma     Status: Abnormal   Collection Time: 12/04/2018  6:25 AM  Result Value Ref Range   Lactic Acid, Venous 2.2 (HH) 0.5 - 1.9 mmol/L    Comment: CRITICAL RESULT CALLED TO, READ BACK BY AND VERIFIED WITH [facility] 11/12/2018 0702 SJL Performed at [facility], [address] [place], [place] [address]   Blood gas, arterial     Status: Abnormal   Collection Time: 11/10/2018  6:56 AM  Result Value Ref Range   FIO2 1.00    Delivery systems VENTILATOR    Mode ASSIST CONTROL    VT 500 mL   LHR 20 resp/min   Peep/cpap 5.0 cm H20   pH, Arterial 7.21 (L) 7.350 - 7.450   pCO2 arterial 67 (HH) 32.0 - 48.0 mmHg    Comment: CRITICAL RESULT CALLED TO, READ BACK BY AND VERIFIED WITH: CRITICAL VALUE 12/04/2018,0740,DR. RIFENBARK/FD    pO2, Arterial 225 (H) 83.0 - 108.0 mmHg   Bicarbonate 26.8 20.0 - 28.0 mmol/L   Acid-base deficit 2.6 (H) 0.0 - 2.0 mmol/L   O2 Saturation 99.7 %   Patient temperature 37.0     Collection site RIGHT RADIAL    Sample type ARTERIAL DRAW    Allens test (pass/fail) POSITIVE (A) PASS    Comment: Performed at [facility], [address], [place], [place] [address]    Dg Chest Port 1 View  Result Date: 11/09/2018 CLINICAL DATA:  Evaluate endotracheal tube status. EXAM: PORTABLE CHEST 1 VIEW COMPARISON:  Earlier today at 0600 hours. FINDINGS: 640 hours. Endotracheal tube terminates 5.1 cm above carina.Mild cardiomegaly. Small right pleural effusion or pleural thickening, blunting the right costophrenic angle. Right base airspace disease. Hyperinflation. No congestive failure. No pneumothorax. IMPRESSION: Appropriate position of endotracheal tube, 5.1 cm above carina. Persistent right base airspace disease and pleural fluid or thickening. Electronically Signed   By: Abigail Miyamoto M.D.   On: 11/13/2018 07:15   Dg Chest Port 1 View  Result Date: 11/28/2018 CLINICAL DATA:  Respiratory distress. Carcinoid tumor. EXAM: PORTABLE CHEST 1 VIEW COMPARISON:  09/04/2018 radiography. Chest CT 10/29/2017 FINDINGS: Similar appearance to previous exams. Right hilar region mass with calcification better shown at CT. Chronic volume loss in the right lower lung with right pleural blunting. There could be coexistent pneumonia in this region. Some emphysema in the upper lungs. No acute finding on the left. IMPRESSION: Right hilar region mass with calcification better shown at CT. Chronic volume loss in the right lower lung with right pleural blunting. There could be coexistent pneumonia in this region. Electronically Signed   By: Jan Fireman.D.  On: 11/11/2018 06:53    ROS  Unable to obtain review of systems as patient is intubated on mechanical ventilation Blood pressure (!) 157/91, pulse 66, resp. rate 19, height 5' 9"  (1.753 m), weight 106.6 kg, SpO2 94 %. Physical Exam  Vital signs: Please see the above listed vital signs HEENT: Trachea midline, no thyromegaly appreciated, patient  is sedated on mechanical ventilation, oral intubation and orogastric tube Cardiovascular: Irregularly irregular rhythm with controlled ventricular response Abdominal: Positive bowel sounds, soft exam Extremities: 3+ pitting edema Neurologic: Patient is sedated on mechanical ventilation, limited exam Cutaneous: Signs of chronic venous stasis in his lower extremities   Assessment/Plan:  Respiratory failure.  Hypercapnic respiratory failure requiring intubation.  Patient's PCO2 was 69 and pH was 7.21.  I reviewed chest x-ray and CT scan which reveals collapse of the basilar lung segments.  CT scan revealed calcified right infrahilar mass with some endoluminal narrowing posteriorly and medially past right mainstem bronchus and bronchus intermedius.  Unclear specific pathologic etiology possibilities do include carcinoid versus other slowly glowing malignancies or nonmalignant processes will place on on spectrum antibiotics to include Zosyn.  Albuterol, Atrovent and Solu-Medrol for his COPD.  Chronic alcoholism.  Will place patient on thiamine, multivitamins, fluid resuscitation and CIWA protocol.   Atrial fibrillation.  Controlled ventricular response  Elevated BNP.  Patient does have a history of coronary artery disease, most recent echo revealed reduced ejection fraction at 35 to 40% with some mitral valvular disease we will recheck echocardiogram   Fionnuala Hemmerich 11/26/2018, 9:02 AM

## 2018-11-15 NOTE — Progress Notes (Signed)
Pt QTc 480s. Dr. Fritzi Mandes notifed. Sotalol 80mg  held per Dr's  order.

## 2018-11-15 NOTE — ED Triage Notes (Signed)
Pt taken via wheelchair to rm 3. Pt with copd O2 dependant. Labored resp. Trying to detox from alcohol.

## 2018-11-15 NOTE — H&P (Addendum)
[facility] NAME: Marvin Lewis    MR#:  [id number]  DATE OF BIRTH:  December 13, 1950  DATE OF ADMISSION:  11/13/2018  PRIMARY CARE PHYSICIAN: Arnetha Courser, MD   REQUESTING/REFERRING PHYSICIAN: Dr Mable Paris  CHIEF COMPLAINT:  patient came in with respiratory distress. History obtained from ER physician and patient's wife. Is currently intubated on the ventilator. He is unable to give any history review of systems.  HISTORY OF PRESENT ILLNESS:  Marvin Lewis  is a 67 y.o. male with a known history of chronic respiratory failure secondary to COPD severe on home oxygen with ongoing tobacco abuse, chronic atrial fibrillation not on anticoagulation secondary to hemoptysis, aortic aneurysm 4.4 cm, chronic alcohol abuse with active drinking, hypertension comes to the emergency room after patient's wife found him being very restless this morning around 5 AM and short winded with diaphoresis and tremors.  In The ER patient was found to be hypoxic with sats durations in the 70s. Patient was intubated placed on the ventilator.  Part wife patient has history of hilar mass presumed to be carcinoid tumor. He follows with Dr. Raul Del for possible micro aspiration/pneumonitis and had recently three rounds of antibiotics.  Patient received IV Zosyn. He is currently on propofol. He is also on levophed. pt is being admitted with acute on chronic hypoxic respiratory failure, alcohol withdrawal, possible pneumonia post obstructive/mucoid plug.  PAST MEDICAL HISTORY:   Past Medical History:  Diagnosis Date  . A-fib ([place])   . Alcohol abuse   . Alcohol use   . Aortic aneurysm ([place]) 06/19/2017   Ascending 4.4 cm, CT scan July 2018  . Aortic aneurysm, intrathoracic ([place])   . Ascending aortic aneurysm ([place])   . Ascending aortic aneurysm ([place])   . Atrial fibrillation ([place]) 05/08/2016  . Broken rib April 2016   History of broken ribs x 3  .  Bronchitis   . CAD (coronary artery disease)   . Cataract 02/06/2018   left  . CHF (congestive heart failure) ([place])   . Chronic alcoholism ([place])   . Cirrhosis ([place])   . Closed nondisplaced fracture of fifth metatarsal bone of left foot Nov. 2016   left  . Complication of anesthesia    HAS REQUIRED MULTIPLE REINTUBATIONS AFTER GEN ANESTHESIA. NO PROBLEM WITH MAC/ DID WELL WITH PREVIOUS CATARACT  . COPD (chronic obstructive pulmonary disease) ([place])   . Depression   . Diabetes mellitus without complication ([place])   . Disc disorder of cervical region    COMPRESSED CERVIVAL VERTEBRAE/ TRAUMATIC INJURY/ DIFFICULTY SWALLOWING  . Dysrhythmia   . Emphysema of lung ([place])   . Esophageal rupture   . Femur fracture ([facility])    tight  . GERD (gastroesophageal reflux disease)   . Hepatitis C    HEP B IN 1970'S  . Hip arthritis   . History of diverticulitis    ruptured and sepsis  . History of hiatal hernia   . History of smoking   . HOH (hard of hearing)   . Hyperlipidemia   . Hypertension   . Hypoxemia   . Insomnia   . Movement disorder   . Onychogryphosis   . Oxygen deficiency    3L/HS  . Pneumonia   . Rib fracture   . Seasonal allergies   . Shortness of breath dyspnea   . Sleep apnea    uses O2 @2  L/min at night  . Stroke ([place])    X 4  .  Thrombocytopenia ([place])     PAST SURGICAL HISTOIRY:   Past Surgical History:  Procedure Laterality Date  . APPENDECTOMY    . CATARACT EXTRACTION W/PHACO Right 01/16/2016   Procedure: CATARACT EXTRACTION PHACO AND INTRAOCULAR LENS PLACEMENT (IOC);  Surgeon: Birder Robson, MD;  Location: [facility] ORS;  Service: Ophthalmology;  Laterality: Right;  [place]: 00:45.0 AP%: 25.2 CDE: 11.36  Lot #[id number]  . CATARACT EXTRACTION W/PHACO Left 03/25/2018   Procedure: CATARACT EXTRACTION PHACO AND INTRAOCULAR LENS PLACEMENT ([place]);  Surgeon: Birder Robson, MD;  Location: [facility] ORS;  Service: Ophthalmology;  Laterality: Left;  [place]   01:21 AP%17.6 CDE14.31 Fluid pack lot # [id number]  . CHOLECYSTECTOMY    . COLON RESECTION    . COLON SURGERY    . COLONOSCOPY WITH PROPOFOL N/A 08/27/2016   Procedure: COLONOSCOPY WITH PROPOFOL;  Surgeon: Lollie Sails, MD;  Location: [facility] ENDOSCOPY;  Service: Endoscopy;  Laterality: N/A;  . ELECTROPHYSIOLOGIC STUDY N/A 05/22/2016   Procedure: CARDIOVERSION;  Surgeon: Teodoro Spray, MD;  Location: [facility] ORS;  Service: Cardiovascular;  Laterality: N/A;  . FRACTURE SURGERY Right    SHOULDER  . HERNIA REPAIR    . INTRAMEDULLARY (IM) NAIL INTERTROCHANTERIC Right 09/16/2017   Procedure: INTRAMEDULLARY (IM) NAIL INTERTROCHANTRIC;  Surgeon: Thornton Park, MD;  Location: [facility] ORS;  Service: Orthopedics;  Laterality: Right;  . PEG PLACEMENT N/A   . PEG TUBE REMOVAL N/A   . PILONIDAL CYST EXCISION N/A   . TRACHEOSTOMY N/A     SOCIAL HISTORY:   Social History   Tobacco Use  . Smoking status: Former Smoker    Packs/day: 0.25    Years: 40.00    Pack years: 10.00    Types: Cigarettes  . Smokeless tobacco: Never Used  Substance Use Topics  . Alcohol use: Yes    Comment: pint of vodka a week    FAMILY HISTORY:   Family History  Problem Relation Age of Onset  . Coronary artery disease Mother   . Hypertension Mother   . Heart disease Mother   . Lung disease Mother   . Coronary artery disease Father   . Hypertension Father   . Diabetes Father   . Heart disease Father   . Cancer Father        prostate  . Alcohol abuse Brother     DRUG ALLERGIES:  No Known Allergies  REVIEW OF SYSTEMS:  Review of Systems  Unable to perform ROS: Intubated     MEDICATIONS AT HOME:   Prior to Admission medications   Medication Sig Start Date End Date Taking? Authorizing Provider  amLODipine (NORVASC) 5 MG tablet Take 5 mg by mouth daily. 06/13/17  Yes [provider]  atorvastatin (LIPITOR) 10 MG tablet Take 1 tablet (10 mg total) by mouth at bedtime. 11/09/16  Yes Lada, Satira Anis, MD  benazepril (LOTENSIN) 10 MG tablet Take 10 mg by mouth daily.    Yes [provider]  furosemide (LASIX) 20 MG tablet Take 2 tablets (40 mg total) by mouth daily. 09/05/17  Yes Gouru, Illene Silver, MD  insulin aspart (NOVOLOG) 100 UNIT/ML injection Inject 12-20 Units into the skin 3 (three) times daily before meals. 12 at breakfast and lunch, 20 at super *Per sliding scale   Yes [provider]  insulin degludec (TRESIBA) 100 UNIT/ML SOPN FlexTouch Pen Inject 50 Units into the skin at bedtime.    Yes [provider]  ipratropium-albuterol (DUONEB) 0.5-2.5 (3) MG/3ML SOLN Take 3 mLs by nebulization every 6 (  six) hours as needed. For shortness of breath/wheezing   Yes [provider]  omeprazole (PRILOSEC) 20 MG capsule Take 20 mg by mouth daily.   Yes [provider]  sotalol (BETAPACE) 80 MG tablet Take 1 tablet (80 mg total) by mouth 2 (two) times daily. 05/22/16  Yes Teodoro Spray, MD  B-D INS SYRINGE 0.5CC/30GX1/2" 30G X 1/2" 0.5 ML MISC AS DIRECTED. 07/17/15   Guadalupe Maple, MD  fluticasone-salmeterol (ADVAIR HFA) 115-21 MCG/ACT inhaler Inhale 2 puffs into the lungs every 12 (twelve) hours as needed.     [provider]  zaleplon (SONATA) 10 MG capsule Take 1 capsule by mouth daily. 10/21/18   [provider]  zolpidem (AMBIEN) 10 MG tablet Take 10 mg by mouth at bedtime as needed for sleep.     [provider]      VITAL SIGNS:  Blood pressure (!) 149/84, pulse 82, resp. rate 16, height 5' 9"  (1.753 m), weight 106.6 kg, SpO2 99 %.  PHYSICAL EXAMINATION:  GENERAL:  67 y.o.-year-old patient lying in the bed with no acute distress. Sedated intubated on the ventilator EYES: Pupils equal, round, reactive to light and accommodation. No scleral icterus. Extraocular muscles intact.  HEENT: Head atraumatic, normocephalic. Oropharynx and nasopharynx clear.  NECK:  Supple, no jugular venous distention. No thyroid enlargement, no  tenderness.  LUNGS: Normal breath sounds bilaterally, no wheezing, rales,rhonchi or crepitation. No use of accessory muscles of respiration.  CARDIOVASCULAR: S1, S2 normal. No murmurs, rubs, or gallops.  ABDOMEN: Soft, nontender, nondistended. Bowel sounds present. No organomegaly or mass.  EXTREMITIES: No pedal edema, cyanosis, or clubbing.  NEUROLOGIC: on the ventilator  PSYCHIATRIC: sedated on the ventilator  SKIN: No obvious rash, lesion, or ulcer.   LABORATORY PANEL:   CBC Recent Labs  Lab 11/12/2018 0618  WBC 10.3  HGB 13.3  HCT 42.7  PLT 156   ------------------------------------------------------------------------------------------------------------------  Chemistries  Recent Labs  Lab 11/22/2018 0607 11/30/2018 0618  NA  --  140  K  --  4.4  CL  --  101  CO2  --  27  GLUCOSE  --  179*  BUN  --  22  CREATININE  --  0.84  CALCIUM  --  8.7*  MG 1.7  --   AST  --  71*  ALT  --  67*  ALKPHOS  --  105  BILITOT  --  0.9   ------------------------------------------------------------------------------------------------------------------  Cardiac Enzymes Recent Labs  Lab 11/12/2018 0618  TROPONINI <0.03   ------------------------------------------------------------------------------------------------------------------  RADIOLOGY:  Dg Chest Port 1 View  Result Date: 12/01/2018 CLINICAL DATA:  Evaluate endotracheal tube status. EXAM: PORTABLE CHEST 1 VIEW COMPARISON:  Earlier today at 0600 hours. FINDINGS: 640 hours. Endotracheal tube terminates 5.1 cm above carina.Mild cardiomegaly. Small right pleural effusion or pleural thickening, blunting the right costophrenic angle. Right base airspace disease. Hyperinflation. No congestive failure. No pneumothorax. IMPRESSION: Appropriate position of endotracheal tube, 5.1 cm above carina. Persistent right base airspace disease and pleural fluid or thickening. Electronically Signed   By: Abigail Miyamoto M.D.   On: 11/25/2018 07:15    Dg Chest Port 1 View  Result Date: 11/25/2018 CLINICAL DATA:  Respiratory distress. Carcinoid tumor. EXAM: PORTABLE CHEST 1 VIEW COMPARISON:  09/04/2018 radiography. Chest CT 10/29/2017 FINDINGS: Similar appearance to previous exams. Right hilar region mass with calcification better shown at CT. Chronic volume loss in the right lower lung with right pleural blunting. There could be coexistent pneumonia in this region.  Some emphysema in the upper lungs. No acute finding on the left. IMPRESSION: Right hilar region mass with calcification better shown at CT. Chronic volume loss in the right lower lung with right pleural blunting. There could be coexistent pneumonia in this region. Electronically Signed   By: Nelson Chimes M.D.   On: 12/07/2018 06:53    EKG:    IMPRESSION AND PLAN:   Marvin Lewis  is a 67 y.o. male with a known history of chronic respiratory failure secondary to COPD severe on home oxygen with ongoing tobacco abuse, chronic atrial fibrillation not on anticoagulation secondary to hemoptysis, aortic aneurysm 4.4 cm, chronic alcohol abuse with active drinking, hypertension comes to the emergency room after patient's wife found him being very restless this morning around 5 AM and short winded with diaphoresis and tremors. In the ER patient was found to be hypoxic with sats durations in the 70s. Patient was intubated placed on the ventilator.  1. acute on chronic hypoxic respiratory failure secondary to COPD exacerbation/possible post obstructive pneumonia/aspiration -patient intubated on the ventilator -discussed with ICU attending Dr. Jefferson Fuel -IV Zosyn, IV Solu-Medrol, inhalers nebulizer -vent management per ICU attending  2. Hypotension -patient on IV pressers--- blood pressure improved wean off IV pressers  3. Ongoing alcohol abuse -according to the wife patient drinks about 1 to 2 pints of vodka a week and significant amount of beer on a daily basis -watch for  withdrawal  4. Chronic atrial fibrillation -continue sotalol -patient not on anticoagulation due to history of hemoptysis -follows with Dr. Ubaldo Glassing  5. Diabetes type II -sliding scale insulin and Lantos  6. Known hilar/perihilar mass suspected carcinoid -follows with Dr. Raul Del--- limited options given poor lung condition (clinic notes)  7 elevated transaminases secondary to alcohol abuse  8. DVT prophylaxis subcu heparin  9. Known ascending aortic aneurysm 4.4 cm-- for wife this is followed by Dr. Ubaldo Glassing  All the records are reviewed and case discussed with ED provider. Management plans discussed with the patient, family and they are in agreement.  CODE STATUS: full  TOTAL TIME TAKING CARE OF THIS PATIENT: *50 minutes* minutes.    Fritzi Mandes M.D on 12/04/2018 at 8:18 AM  Between 7am to 6pm - Pager - [phone]  After 6pm go to [email or web address] - password [email or web address] [facility]  [facility]  Office  [phone]  CC: Primary care physician; Arnetha Courser, MD

## 2018-11-15 NOTE — ED Provider Notes (Signed)
[facility] Emergency Department Provider Note  ____________________________________________   First MD Initiated Contact with Patient 12/05/2018 [phone]     (approximate)  I have reviewed the triage vital signs and the nursing notes.   HISTORY  Chief Complaint Respiratory Distress and Withdrawal    HPI Marvin Lewis is a 67 y.o. male self presents to the emergency department with sudden onset severe shortness of breath that began roughly 90 minutes prior to arrival.  The patient has a complex past medical history including COPD, cirrhosis, coronary artery disease,  a lung mass, congestive heart failure, and chronic alcohol abuse.  He had an outpatient CT scan performed 2 weeks ago and just yesterday completed a course of doxycycline for right middle lobe pneumonia seen on that outpatient CT.  He was in his usual state of health until he became acutely and severely short of breath.  It came on suddenly is severe and nothing seems to make it better or worse.  Denies fevers or chills.  His wife feels that he is withdrawing from alcohol.  He has no particular chest pain.  He has to sit upright or he feels like he cannot breathe.   Past Medical History:  Diagnosis Date  . A-fib ([place])   . Alcohol abuse   . Alcohol use   . Aortic aneurysm ([place]) 06/19/2017   Ascending 4.4 cm, CT scan July 2018  . Aortic aneurysm, intrathoracic ([place])   . Ascending aortic aneurysm ([place])   . Ascending aortic aneurysm ([place])   . Atrial fibrillation ([place]) 05/08/2016  . Broken rib April 2016   History of broken ribs x 3  . Bronchitis   . CAD (coronary artery disease)   . Cataract 02/06/2018   left  . CHF (congestive heart failure) ([place])   . Chronic alcoholism ([place])   . Cirrhosis ([place])   . Closed nondisplaced fracture of fifth metatarsal bone of left foot Nov. 2016   left  . Complication of anesthesia    HAS REQUIRED MULTIPLE REINTUBATIONS AFTER GEN ANESTHESIA. NO PROBLEM WITH  MAC/ DID WELL WITH PREVIOUS CATARACT  . COPD (chronic obstructive pulmonary disease) ([place])   . Depression   . Diabetes mellitus without complication ([place])   . Disc disorder of cervical region    COMPRESSED CERVIVAL VERTEBRAE/ TRAUMATIC INJURY/ DIFFICULTY SWALLOWING  . Dysrhythmia   . Emphysema of lung ([place])   . Esophageal rupture   . Femur fracture (HCC)    tight  . GERD (gastroesophageal reflux disease)   . Hepatitis C    HEP B IN 1970'S  . Hip arthritis   . History of diverticulitis    ruptured and sepsis  . History of hiatal hernia   . History of smoking   . HOH (hard of hearing)   . Hyperlipidemia   . Hypertension   . Hypoxemia   . Insomnia   . Movement disorder   . Onychogryphosis   . Oxygen deficiency    3L/HS  . Pneumonia   . Rib fracture   . Seasonal allergies   . Shortness of breath dyspnea   . Sleep apnea    uses O2 _0  L/min at night  . Stroke ([place])    X 4  . Thrombocytopenia [facility])     Patient Active Problem List   Diagnosis Date Noted  . Respiratory failure, acute-on-chronic ([place]) 12/01/2018  . Closed right hip fracture ([place]) 09/16/2017  . Acute respiratory distress   . Palliative care by specialist   .  Aortic aneurysm ([place]) 06/19/2017  . Hemoptysis 06/17/2017  . Leg cramps 05/08/2016  . Anemia 05/08/2016  . Atrial fibrillation ([place]) 05/08/2016  . Abnormal weight gain 04/22/2016  . Goals of care, counseling/discussion 04/22/2016  . Elevated brain natriuretic peptide (BNP) level 04/22/2016  . Persistent atrial fibrillation 03/29/2016  . Breathlessness on exertion 03/27/2016  . Gout 02/23/2016  . Hyperkalemia 01/12/2016  . Onychomycosis 01/10/2016  . Hoarseness of voice 01/10/2016  . Tobacco abuse 01/10/2016  . Hx of completed stroke 01/10/2016  . Athletes foot 08/09/2015  . Chronic alcoholism ([place])   . Movement disorder   . COPD (chronic obstructive pulmonary disease) ([place])   . Emphysema of lung ([place])   . Type II diabetes mellitus,  uncontrolled ([place])   . Hyperlipidemia   . Hypertension   . CAD (coronary artery disease)   . Hepatitis C   . Cirrhosis ([place])   . Alcohol abuse   . Onychogryphosis   . Insomnia   . Hip arthritis   . Hypoxemia   . 1st degree AV block     Past Surgical History:  Procedure Laterality Date  . APPENDECTOMY    . CATARACT EXTRACTION W/PHACO Right 01/16/2016   Procedure: CATARACT EXTRACTION PHACO AND INTRAOCULAR LENS PLACEMENT (IOC);  Surgeon: Birder Robson, MD;  Location: [facility] ORS;  Service: Ophthalmology;  Laterality: Right;  [place]: 00:45.0 AP%: 25.2 CDE: 11.36  Lot #[id number]  . CATARACT EXTRACTION W/PHACO Left 03/25/2018   Procedure: CATARACT EXTRACTION PHACO AND INTRAOCULAR LENS PLACEMENT ([place]);  Surgeon: Birder Robson, MD;  Location: [facility] ORS;  Service: Ophthalmology;  Laterality: Left;  [place]  01:21 AP%17.6 CDE14.31 Fluid pack lot # [id number]  . CHOLECYSTECTOMY    . COLON RESECTION    . COLON SURGERY    . COLONOSCOPY WITH PROPOFOL N/A 08/27/2016   Procedure: COLONOSCOPY WITH PROPOFOL;  Surgeon: Lollie Sails, MD;  Location: [facility] ENDOSCOPY;  Service: Endoscopy;  Laterality: N/A;  . ELECTROPHYSIOLOGIC STUDY N/A 05/22/2016   Procedure: CARDIOVERSION;  Surgeon: Teodoro Spray, MD;  Location: [facility] ORS;  Service: Cardiovascular;  Laterality: N/A;  . FRACTURE SURGERY Right    SHOULDER  . HERNIA REPAIR    . INTRAMEDULLARY (IM) NAIL INTERTROCHANTERIC Right 09/16/2017   Procedure: INTRAMEDULLARY (IM) NAIL INTERTROCHANTRIC;  Surgeon: Thornton Park, MD;  Location: [facility] ORS;  Service: Orthopedics;  Laterality: Right;  . PEG PLACEMENT N/A   . PEG TUBE REMOVAL N/A   . PILONIDAL CYST EXCISION N/A   . TRACHEOSTOMY N/A     Prior to Admission medications   Medication Sig Start Date End Date Taking? Authorizing Provider  amLODipine (NORVASC) 5 MG tablet Take 5 mg by mouth daily. 06/13/17  Yes [provider]  atorvastatin (LIPITOR) 10 MG tablet Take 1 tablet (10 mg total) by mouth  at bedtime. 11/09/16  Yes Lada, Satira Anis, MD  benazepril (LOTENSIN) 10 MG tablet Take 10 mg by mouth daily.    Yes [provider]  furosemide (LASIX) 20 MG tablet Take 2 tablets (40 mg total) by mouth daily. 09/05/17  Yes Gouru, Illene Silver, MD  insulin aspart (NOVOLOG) 100 UNIT/ML injection Inject 12-20 Units into the skin 3 (three) times daily before meals. 12 at breakfast and lunch, 20 at super *Per sliding scale   Yes [provider]  insulin degludec (TRESIBA) 100 UNIT/ML SOPN FlexTouch Pen Inject 50 Units into the skin at bedtime.    Yes [provider]  ipratropium-albuterol (DUONEB) 0.5-2.5 (3) MG/3ML SOLN Take 3 mLs by nebulization every  6 (six) hours as needed. For shortness of breath/wheezing   Yes [provider]  omeprazole (PRILOSEC) 20 MG capsule Take 20 mg by mouth daily.   Yes [provider]  sotalol (BETAPACE) 80 MG tablet Take 1 tablet (80 mg total) by mouth 2 (two) times daily. 05/22/16  Yes Teodoro Spray, MD  B-D INS SYRINGE 0.5CC/30GX1/2" 30G X 1/2" 0.5 ML MISC AS DIRECTED. 07/17/15   Guadalupe Maple, MD  fluticasone-salmeterol (ADVAIR HFA) 115-21 MCG/ACT inhaler Inhale 2 puffs into the lungs every 12 (twelve) hours as needed.     [provider]  zaleplon (SONATA) 10 MG capsule Take 1 capsule by mouth daily. 10/21/18   [provider]  zolpidem (AMBIEN) 10 MG tablet Take 10 mg by mouth at bedtime as needed for sleep.     [provider]    Allergies Patient has no known allergies.  Family History  Problem Relation Age of Onset  . Coronary artery disease Mother   . Hypertension Mother   . Heart disease Mother   . Lung disease Mother   . Coronary artery disease Father   . Hypertension Father   . Diabetes Father   . Heart disease Father   . Cancer Father        prostate  . Alcohol abuse Brother     Social History Social History   Tobacco Use  . Smoking status: Former Smoker    Packs/day: 0.25      Years: 40.00    Pack years: 10.00    Types: Cigarettes  . Smokeless tobacco: Never Used  Substance Use Topics  . Alcohol use: Yes    Comment: pint of vodka a week  . Drug use: No    Review of Systems Constitutional: No fever/chills Eyes: No visual changes. ENT: No sore throat. Cardiovascular: Denies chest pain. Respiratory: Positive for shortness of breath. Gastrointestinal: No abdominal pain.  No nausea, no vomiting.  No diarrhea.  No constipation. Genitourinary: Negative for dysuria. Musculoskeletal: Negative for back pain. Skin: Negative for rash. Neurological: Negative for headaches, focal weakness or numbness.   ____________________________________________   PHYSICAL EXAM:  VITAL SIGNS: ED Triage Vitals  Enc Vitals Group     BP      Pulse      Resp      Temp      Temp src      SpO2      Weight      Height      Head Circumference      Peak Flow      Pain Score      Pain Loc      Pain Edu?      Excl. in [place]?     Constitutional: Appears critically ill sitting upright breathing 40 times a minute using accessory muscles and diaphoretic Eyes: PERRL EOMI. Head: Atraumatic. Nose: No congestion/rhinnorhea. Mouth/Throat: No trismus mild tongue fasciculations Neck: No stridor.  Previous tracheostomy stoma visualized Cardiovascular: Tachycardic rate, regular rhythm. Grossly normal heart sounds.  Good peripheral circulation. Respiratory: Severe respiratory distress with rhonchi bilaterally and moving limited amounts of air Gastrointestinal: Soft nontender Musculoskeletal: 2+ pitting edema to mid shin bilaterally Neurologic: Moves all 4 extremities. Skin: Spider angiomata across upper chest.  Mildly diaphoretic Psychiatric: Very anxious appearing    ____________________________________________   DIFFERENTIAL includes but not limited to  Mucous plugging, pneumonia, pneumothorax, lobar collapse, pulmonary embolism, congestive heart failure,  influenza ____________________________________________   LABS (all labs ordered are  listed, but only abnormal results are displayed)  Labs Reviewed  COMPREHENSIVE METABOLIC PANEL - Abnormal; Notable for the following components:      Result Value   Glucose, Bld 179 (*)    Calcium 8.7 (*)    AST 71 (*)    ALT 67 (*)    All other components within normal limits  BRAIN NATRIURETIC PEPTIDE - Abnormal; Notable for the following components:   B Natriuretic Peptide 1,211.0 (*)    All other components within normal limits  CBC WITH DIFFERENTIAL/PLATELET - Abnormal; Notable for the following components:   Monocytes Absolute 1.1 (*)    All other components within normal limits  LACTIC ACID, PLASMA - Abnormal; Notable for the following components:   Lactic Acid, Venous 2.2 (*)    All other components within normal limits  BLOOD GAS, ARTERIAL - Abnormal; Notable for the following components:   pH, Arterial 7.21 (*)    pCO2 arterial 67 (*)    pO2, Arterial 225 (*)    Acid-base deficit 2.6 (*)    Allens test (pass/fail) POSITIVE (*)    All other components within normal limits  CULTURE, BLOOD (ROUTINE X 2)  CULTURE, BLOOD (ROUTINE X 2)  TROPONIN I  INFLUENZA PANEL BY PCR (TYPE A & B)  PROTIME-INR  MAGNESIUM  PHOSPHORUS  APTT  PROCALCITONIN  BLOOD GAS, VENOUS  I-STAT CG4 LACTIC ACID, ED    Lab work reviewed by me with a number of abnormalities.  The patient has an elevated beta natruretic peptide concerning for pulmonary edema.  Low pH and high PCO2 consistent with acute respiratory acidosis and acidemia.  Lactic acidosis concerning for infection __________________________________________  EKG  ED ECG REPORT I, Darel Hong, the attending physician, personally viewed and interpreted this ECG.  Date: 11/17/2018 EKG Time:  Rate: 115 Rhythm: Sinus tachycardia QRS Axis: normal Intervals: normal ST/T Wave abnormalities: normal Narrative Interpretation: no evidence of acute  ischemia  ____________________________________________  RADIOLOGY  First chest x-ray reviewed by me shows chronic changes but no obvious acute disease Second chest x-ray reviewed by me shows endotracheal tube in good position ____________________________________________   PROCEDURES  Procedure(s) performed: Yes  Procedure Name: Intubation Date/Time: 11/12/2018 8:20 AM Performed by: Darel Hong, MD Pre-anesthesia Checklist: Patient identified, Patient being monitored, Emergency Drugs available, Timeout performed and Suction available Oxygen Delivery Method: Non-rebreather mask Preoxygenation: Pre-oxygenation with 100% oxygen Induction Type: Rapid sequence Ventilation: Mask ventilation without difficulty Laryngoscope Size: Mac and 4 Grade View: Grade I Tube size: 6.5 mm Number of attempts: 2 Placement Confirmation: ETT inserted through vocal cords under direct vision,  CO2 detector and Breath sounds checked- equal and bilateral Secured at: 24 cm Tube secured with: ETT holder Comments: This was a challenging intubation.  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  Laryngoscopy was easily obtained after giving fentanyl ketamine and succinylcholine and using a MAC 4 blade.  I had a 7.5 ET tube and easily passed it to the cords although was not able to pass it past the cords.  I then backed out and placed a bougie into the patient's airway.  Even the bougie encountered a small bit of resistance initially.  I then passed a 6.5 ET tube over the bougie and through the cords and did meet a brief amount of resistance.  Immediately following this the patient had bright red blood in his ET tube and I likely caused some  degree of bleeding from the subglottic stenosis.    .Critical Care Performed by: Darel Hong, MD Authorized by: Darel Hong, MD   Critical care provider statement:     Critical care time (minutes):  65   Critical care time was exclusive of:  Separately billable procedures and treating other patients   Critical care was necessary to treat or prevent imminent or life-threatening deterioration of the following conditions:  Respiratory failure and shock   Critical care was time spent personally by me on the following activities:  Development of treatment plan with patient or surrogate, discussions with consultants, evaluation of patient's response to treatment, examination of patient, obtaining history from patient or surrogate, ordering and performing treatments and interventions, ordering and review of laboratory studies, ordering and review of radiographic studies, pulse oximetry, re-evaluation of patient's condition and review of old charts   Angiocath insertion Performed by: Darel Hong  Consent: Verbal consent obtained. Risks and benefits: risks, benefits and alternatives were discussed Time out: Immediately prior to procedure a "time out" was called to verify the correct patient, procedure, equipment, support staff and site/side marked as required.  Preparation: Patient was prepped and draped in the usual sterile fashion.  Vein Location: Right upper extremity  Ultrasound Guided  Gauge: 16  Normal blood return and flush without difficulty Patient tolerance: Patient tolerated the procedure well with no immediate complications.   Angiocath insertion Performed by: Darel Hong  Consent: Verbal consent obtained. Risks and benefits: risks, benefits and alternatives were discussed Time out: Immediately prior to procedure a "time out" was called to verify the correct patient, procedure, equipment, support staff and site/side marked as required.  Preparation: Patient was prepped and draped in the usual sterile fashion.  Vein Location: Left upper extremity  Ultrasound Guided  Gauge: 16  Normal blood return and flush without difficulty Patient  tolerance: Patient tolerated the procedure well with no immediate complications.     Critical Care performed: Yes  ____________________________________________   INITIAL IMPRESSION / ASSESSMENT AND PLAN / ED COURSE  Pertinent labs & imaging results that were available during my care of the patient were reviewed by me and considered in my medical decision making (see chart for details).   As part of my medical decision making, I reviewed the following data within the [place] History obtained from family if available, nursing notes, old chart and ekg, as well as notes from prior ED visits.  The patient came to the emergency department critically ill-appearing.  He was saturating in the 70s on room air breathing roughly 40 times a minute sitting bolt upright with mild diaphoresis.  His lungs were somewhat rhonchorous although not particularly wheezy.  He has a known history of COPD as well as a recent bout of pneumonia and completed antibiotics only yesterday.  His sudden onset severe shortness of breath raises concern for lobar collapse versus mucous plugging versus hemoptysis.  His wife also gives a history of severe alcohol abuse and says that she thinks he is starting to withdrawal from alcohol.  I initially placed the patient on BiPAP and gave 4 mg of Ativan for the withdrawal along with 260 mg of phenobarbital.  I discussed with the patient and his wife that I did not feel he was a good BiPAP candidate given his chronic aspiration and the use of benzodiazepines and barbiturates for alcohol withdrawal would make him likely more obtunded and contribute to the aspiration.  Following about 30 minutes together we all agreed  that endotracheal intubation was the safest course of action.  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  I was able to pass a 6.5 ET tube although did encounter some resistance and he had some subsequent bleeding.  Following intubation the  patient had an episode of high peak pressures on the vent and became hypoxic and bradycardic briefly.  Registered nurse April suctioned a large amount of blood clot from his airway and began to bag the patient and shortly thereafter with fluid resuscitation, vasopressors, and sitting him upright to facilitate ventilation his saturations came up and his blood pressure normalized.  He is currently being treated with broad-spectrum antibiotics for expected aspiration pneumonia and I will CT his chest to evaluate for any abscesses versus empyema etc.  His wife was brought to bedside and understands he is critically ill.  I then discussed the case with the hospitalist Dr. Posey Pronto who has graciously agreed to admit the patient to her service.      ____________________________________________   FINAL CLINICAL IMPRESSION(S) / ED DIAGNOSES  Final diagnoses:  Acute respiratory failure with hypoxia (HCC)  Alcohol withdrawal syndrome with complication (HCC)  Lactic acidosis      NEW MEDICATIONS STARTED DURING THIS VISIT:  New Prescriptions   No medications on file     Note:  This document was prepared using Dragon voice recognition software and may include unintentional dictation errors.     Darel Hong, MD 11/14/2018 [phone]

## 2018-11-15 NOTE — ED Notes (Signed)
Patient with sat's dropping into the 80's even intubated and bp 73/53

## 2018-11-15 NOTE — ED Notes (Signed)
Pt returned from CT °

## 2018-11-15 NOTE — ED Notes (Signed)
MD reported give zosyn at this time. Blood cultures have not been collected

## 2018-11-15 NOTE — ED Notes (Addendum)
Per MD levophed changed to 30 mcg/min at this time

## 2018-11-15 NOTE — ED Notes (Signed)
Levophed rate changed to 43mcg/min

## 2018-11-15 NOTE — Progress Notes (Signed)
Patient sister Marvin Lewis at bedside states patient was trying to quit drinking on his own when withdraw symptoms became worse. He tried to drink a couple beer to help symptoms and told his wife to bring him to the hospital when they did not decrease.

## 2018-11-15 NOTE — ED Notes (Signed)
50 mcg propofol bolus at this time

## 2018-11-15 NOTE — ED Notes (Signed)
150 mcg fentanyl bolus at this time

## 2018-11-15 NOTE — Code Documentation (Signed)
Levophed rate changed to 20 mcg/min at this time

## 2018-11-15 NOTE — Progress Notes (Signed)
Family Meeting Note   Today a meeting took place with the wife in the ER   Patient being admitted with acute hypoxic respiratory failure. He has chronic COPD on home oxygen. Has history of alcohol abuse chronic a fib and history of probable micro aspirations. 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 Continues to drink alcohol on a daily basis according to the wife. Appears critically ill. 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  Wife understands poor prognosis. Additional follow-up to be provided: palliative care  Time spent during discussion: 17 minutes  Fritzi Mandes, MD

## 2018-11-16 DIAGNOSIS — J9621 Acute and chronic respiratory failure with hypoxia: Secondary | ICD-10-CM

## 2018-11-16 DIAGNOSIS — J9622 Acute and chronic respiratory failure with hypercapnia: Secondary | ICD-10-CM

## 2018-11-16 DIAGNOSIS — F10231 Alcohol dependence with withdrawal delirium: Secondary | ICD-10-CM

## 2018-11-16 LAB — BASIC METABOLIC PANEL
ANION GAP: 9 (ref 5–15)
BUN: 31 mg/dL — ABNORMAL HIGH (ref 8–23)
CO2: 24 mmol/L (ref 22–32)
Calcium: 7.8 mg/dL — ABNORMAL LOW (ref 8.9–10.3)
Chloride: 105 mmol/L (ref 98–111)
Creatinine, Ser: 1.19 mg/dL (ref 0.61–1.24)
GFR calc non Af Amer: >60 mL/min (ref >60)
Glucose, Bld: 174 mg/dL — ABNORMAL HIGH (ref 70–99)
Potassium: 4.2 mmol/L (ref 3.5–5.1)
Sodium: 138 mmol/L (ref 135–145)

## 2018-11-16 LAB — GLUCOSE, CAPILLARY
Glucose-Capillary: 104 mg/dL — ABNORMAL HIGH (ref 70–99)
Glucose-Capillary: 110 mg/dL — ABNORMAL HIGH (ref 70–99)
Glucose-Capillary: 152 mg/dL — ABNORMAL HIGH (ref 70–99)
Glucose-Capillary: 160 mg/dL — ABNORMAL HIGH (ref 70–99)
Glucose-Capillary: 167 mg/dL — ABNORMAL HIGH (ref 70–99)
Glucose-Capillary: 167 mg/dL — ABNORMAL HIGH (ref 70–99)
Glucose-Capillary: 270 mg/dL — ABNORMAL HIGH (ref 70–99)

## 2018-11-16 LAB — CBC
HCT: 33.1 % — ABNORMAL LOW (ref 39.0–52.0)
Hemoglobin: 10.7 g/dL — ABNORMAL LOW (ref 13.0–17.0)
MCH: 30.6 pg (ref 26.0–34.0)
MCHC: 32.3 g/dL (ref 30.0–36.0)
MCV: 94.6 fL (ref 80.0–100.0)
NRBC: 0 % (ref 0.0–0.2)
Platelets: 100 10*3/uL — ABNORMAL LOW (ref 150–400)
RBC: 3.5 MIL/uL — ABNORMAL LOW (ref 4.22–5.81)
RDW: 13.2 % (ref 11.5–15.5)
WBC: 9.2 10*3/uL (ref 4.0–10.5)

## 2018-11-16 LAB — LACTIC ACID, PLASMA: Lactic Acid, Venous: 1.7 mmol/L (ref 0.5–1.9)

## 2018-11-16 LAB — MAGNESIUM: Magnesium: 2 mg/dL (ref 1.7–2.4)

## 2018-11-16 LAB — PHOSPHORUS: Phosphorus: 4.9 mg/dL — ABNORMAL HIGH (ref 2.5–4.6)

## 2018-11-16 MED ORDER — VITAMIN B-1 100 MG PO TABS
100.0000 mg | ORAL_TABLET | Freq: Every day | ORAL | Status: DC
  Administered 2018-11-16 – 2018-11-18 (×3): 100 mg
  Filled 2018-11-16 (×3): qty 1

## 2018-11-16 MED ORDER — PRO-STAT SUGAR FREE PO LIQD
60.0000 mL | Freq: Four times a day (QID) | ORAL | Status: DC
  Administered 2018-11-16 – 2018-11-18 (×8): 60 mL

## 2018-11-16 MED ORDER — ADULT MULTIVITAMIN LIQUID CH
15.0000 mL | Freq: Every day | ORAL | Status: DC
  Administered 2018-11-16 – 2018-11-18 (×3): 15 mL
  Filled 2018-11-16 (×4): qty 15

## 2018-11-16 MED ORDER — CLONAZEPAM 1 MG PO TABS
1.0000 mg | ORAL_TABLET | Freq: Two times a day (BID) | ORAL | Status: DC
  Administered 2018-11-16 – 2018-11-18 (×5): 1 mg
  Filled 2018-11-16 (×5): qty 1

## 2018-11-16 MED ORDER — PANTOPRAZOLE SODIUM 40 MG IV SOLR
40.0000 mg | INTRAVENOUS | Status: DC
  Administered 2018-11-16 – 2018-11-19 (×4): 40 mg via INTRAVENOUS
  Filled 2018-11-16 (×4): qty 40

## 2018-11-16 MED ORDER — DEXMEDETOMIDINE HCL IN NACL 400 MCG/100ML IV SOLN
0.4000 ug/kg/h | INTRAVENOUS | Status: DC
  Administered 2018-11-17: 1.2 ug/kg/h via INTRAVENOUS
  Administered 2018-11-17: 1 ug/kg/h via INTRAVENOUS
  Administered 2018-11-17 (×2): 1.2 ug/kg/h via INTRAVENOUS
  Administered 2018-11-17: 0.4 ug/kg/h via INTRAVENOUS
  Administered 2018-11-18: 0.5 ug/kg/h via INTRAVENOUS
  Filled 2018-11-16 (×6): qty 100

## 2018-11-16 MED ORDER — VITAL HIGH PROTEIN PO LIQD
1000.0000 mL | ORAL | Status: DC
  Administered 2018-11-16 – 2018-11-17 (×2): 1000 mL

## 2018-11-16 MED ORDER — FOLIC ACID 1 MG PO TABS
1.0000 mg | ORAL_TABLET | Freq: Every day | ORAL | Status: DC
  Administered 2018-11-16 – 2018-11-18 (×3): 1 mg
  Filled 2018-11-16 (×3): qty 1

## 2018-11-16 NOTE — Progress Notes (Addendum)
PALLIATIVE NOTE:  Referral received for goals of care. On assessment no family at the bedside. I contacted patient's wife, Marvin Lewis. I introduced myself and Palliative Medicine. She verbalized appreciation for being involved in her husband's care.   Wife stated she has left to care for their dogs and other errands. She has requested to meet tomorrow morning at 9am to further discuss goals of care.   Detailed note and recommendations to follow [place] meeting on 11/17/18 @ 0900am.   Thank you for your referral and allowing Palliative Medicine to be involved in patient's care.   [name], AGPCNP-BC Palliative Medicine Team  Phone: [phone] Fax: [phone] Pager: [phone] Amion: N. [place]  NO CHARGE

## 2018-11-16 NOTE — Progress Notes (Signed)
Initial Nutrition Assessment  DOCUMENTATION CODES:   Obesity unspecified  INTERVENTION:  Initiate Vital High Protein at 20 mL/hr (480 mL goal daily volume) + Pro-Stat 60 mL QID. Provides 1280 kcal, 162 grams of protein, 403 mL H2O daily. With current propofol rate provides 2294 kcal daily (however this is being titrated down). Can continue this TF regimen even when patient is off propofol gtt as it will still meet his calorie/protein needs.  Provide minimum free water flush of 30 mL Q4hrs to maintain tube patency. He is currently receiving NS at 100 mL/hr.  Provide liquid MVI daily per tube.  Also recommend folic acid 1 mg daily per tube and thiamine 100 mg daily per tube.  NUTRITION DIAGNOSIS:   Inadequate oral intake related to inability to eat as evidenced by NPO status.  GOAL:   Provide needs based on ASPEN/SCCM guidelines  MONITOR:   Vent status, Labs, Weight trends, TF tolerance, I & O's  REASON FOR ASSESSMENT:   Ventilator    ASSESSMENT:   67 year old male with PMHx of GERD, depression, CAD, cirrhosis, EtOH abuse, diverticulitis, severe COPD, emphysema of lung, HLD, HTN, sleep apnea, hx hiatal hernia, hx esophageal rupture, A-fib, intrathoracic aortic aneurysm, hepatitis C, hx tracheostomy s/p decannulation, hx PEG tube s/p removal who is admitted with hypercapnic respiratory failure requiring intubation on 12/8, with calcified right infrahilar mass (carcinoid vs slow growing malignancy vs nonmalignant process), EtOH withdrawal, and elevated BNP.   Patient is intubated and sedated. On PRVC mode with FiO2 40% and PEEP 5 cmH2O. Abdomen soft. Per weight history in chart patient is usually 102.1-106.7 kg. He is currently 114.6 kg (252.65 lbs).  Enteral Access: OGT placed 12/8 (not yet documented in chart); per abdominal x-ray 12/8 tip likely terminates in the body of the stomach  MAP: 75-86 mmHg  Patient is currently intubated on ventilator support Ve: 9.9 L/min Temp  (24hrs), Avg:98.1 F (36.7 C), Min:97.7 F (36.5 C), Max:98.4 F (36.9 C)  Propofol: 38.4 mL/hr (1014 kcal daily)  Medications reviewed and include: clonazepam 1 mg BID, Lasix 40 mg daily IV, Novolog 0-15 units Q4hrs, Solu-Medrol 60 mg daily IV, pantoprazole 40 mg daily IV, NS @ 100 mL/hr, Precedex gtt, fentanyl gtt, Zosyn, Propofol gtt.  Labs reviewed: CBG 160-189, BUN 31, Phosphorus 4.9.  I/O: 1150 mL UOP yestrday (0.4 mL/kg/hr)  Patient does not meet criteria for malnutrition at this time. He is certainly at risk for inadequate nutrition and micronutrient deficiencies in setting of EtOH abuse.  Discussed with RN and on rounds. Plan is to start tube feeds today. Plan is to start coming down on propofol gtt and initiate Precedex gtt.  NUTRITION - FOCUSED PHYSICAL EXAM:    Most Recent Value  Orbital Region  No depletion  Upper Arm Region  No depletion  Thoracic and Lumbar Region  No depletion  Buccal Region  Unable to assess  Temple Region  No depletion  Clavicle Bone Region  No depletion  Clavicle and Acromion Bone Region  No depletion  Scapular Bone Region  Unable to assess  Dorsal Hand  No depletion  Patellar Region  No depletion  Anterior Thigh Region  No depletion  Posterior Calf Region  No depletion  Edema (RD Assessment)  Mild  Hair  Reviewed  Eyes  Unable to assess  Mouth  Unable to assess  Skin  Reviewed  Nails  Reviewed     Diet Order:   Diet Order  Diet NPO time specified  Diet effective now             EDUCATION NEEDS:   No education needs have been identified at this time  Skin:  Skin Assessment: Reviewed RN Assessment  Last BM:  Unknown/PTA  Height:   Ht Readings from Last 1 Encounters:  11/08/2018 5\' 10"  (1.778 m)   Weight:   Wt Readings from Last 1 Encounters:  12/08/2018 114.6 kg   Ideal Body Weight:  75.5 kg  BMI:  Body mass index is 36.25 kg/m.  Estimated Nutritional Needs:   Kcal:  2820-8138 (11-14  kcal/kg)  Protein:  151 grams (2 grams/kg IBW)  Fluid:  1.8 L/day (25 mL/kg IBW)  Willey Blade, MS, RD, LDN Office: [phone] Pager: [phone] After Hours/Weekend Pager: [phone]

## 2018-11-16 NOTE — Progress Notes (Signed)
Follow up - Critical Care Medicine Note  Patient Details:    Marvin Lewis is an 67 y.o. male. Marvin Lewis is a 66 year old gentleman with a past medical history remarkable for severe COPD on home oxygen therapy being followed by Dr. Raul Del, a sending aortic aneurysm, atrial fibrillation, not on anticoagulation secondary to hemoptysis, coronary artery disease, cirrhosis, pretension, hyperlipidemia, CVA, chronic alcoholism has been treated for recurrent outpatient pneumonia by Dr. Raul Del.  Per wife patient has a narrowed right bronchus from a hilar mass.  Process has been considered for stenting in the past.  She states that he developed progressive increasing shortness of breath prompting coming to the emergency room where he was found to be hypoxemic and hypercapnic.  He was also felt to be in withdrawal.  He subsequently was intubated placed on mechanical ventilation.  He had transient hypotension most likely secondary to positive pressure and sedation and was started briefly on norepinephrine  Lines, Airways, Drains: Airway (Active)     Airway 6.5 mm (Active)  Secured at (cm) 24 cm 11/16/2018  8:33 PM  Measured From Lips 11/16/2018  8:33 PM  [place] 11/16/2018  8:33 PM  Secured By [employer] 11/16/2018  8:33 PM  Tube Holder Repositioned Yes 11/16/2018  8:33 PM  Cuff Pressure (cm H2O) 28 cm H2O 11/16/2018  8:33 PM  Site Condition Dry 11/16/2018  8:33 PM     Urethral Catheter april, rn 16 Fr. (Active)  Indication for Insertion or Continuance of Catheter Unstable critical patients (first 24-48 hours) 11/16/2018  4:00 PM  Site Assessment Clean;Intact 11/16/2018  4:00 PM  Catheter Maintenance Bag below level of bladder;Catheter secured;Drainage bag/tubing not touching floor;Insertion date on drainage bag;No dependent loops;Seal intact 11/16/2018  8:00 PM  Collection Container Standard drainage bag 11/16/2018  4:00 PM  Securement Method Securing device (Describe)  11/16/2018  4:00 PM  Urinary Catheter Interventions Unclamped 11/16/2018  4:00 PM  Output (mL) 225 mL 11/16/2018  6:18 PM    Anti-infectives:  Anti-infectives (From admission, onward)   Start     Dose/Rate Route Frequency Ordered Stop   11/14/2018 1400  piperacillin-tazobactam (ZOSYN) IVPB 3.375 g     3.375 g 12.5 mL/hr over 240 Minutes Intravenous Every 8 hours 12/01/2018 0912     11/20/2018 0630  piperacillin-tazobactam (ZOSYN) IVPB 3.375 g     3.375 g 100 mL/hr over 30 Minutes Intravenous  Once 12/02/2018 0938 11/11/2018 0854      Microbiology: Results for orders placed or performed during the hospital encounter of 12/07/2018  MRSA PCR Screening     Status: None   Collection Time: 11/09/2018 10:29 AM  Result Value Ref Range Status   MRSA by PCR NEGATIVE NEGATIVE Final    Comment:        The GeneXpert MRSA Assay (FDA approved for NASAL specimens only), is one component of a comprehensive MRSA colonization surveillance program. It is not intended to diagnose MRSA infection nor to guide or monitor treatment for MRSA infections. Performed at [facility], [address] [place], [place] [address]   Culture, blood (routine x 2)     Status: None (Preliminary result)   Collection Time: 11/22/2018  6:33 PM  Result Value Ref Range Status   Specimen Description BLOOD R ARM  Final   Special Requests   Final    BOTTLES DRAWN AEROBIC AND ANAEROBIC Blood Culture adequate volume   Culture   Final    NO GROWTH < 24 HOURS Performed  at [facility], [name] [place], [place] [address]    Report Status PENDING  Incomplete  Culture, blood (routine x 2)     Status: None (Preliminary result)   Collection Time: 12/04/2018  7:16 PM  Result Value Ref Range Status   Specimen Description BLOOD RIGHT ANTECUBITAL  Final   Special Requests   Final    BOTTLES DRAWN AEROBIC AND ANAEROBIC Blood Culture adequate volume   Culture   Final    NO GROWTH < 12 HOURS Performed at [facility], [address] [place], [place] [address]    Report Status PENDING  Incomplete    Best Practice/Protocols:  VTE Prophylaxis: Heparin (SQ) GI Prophylaxis: Proton Pump Inhibitor   Events:   Studies: Ct Chest Wo Contrast  Result Date: 10/29/2018 CLINICAL DATA:  Shortness of breath, right-sided chest pain and pneumonia. Patient with known history of pulmonary carcinoid tumor. EXAM: CT CHEST WITHOUT CONTRAST TECHNIQUE: Multidetector CT imaging of the chest was performed following the standard protocol without IV contrast. COMPARISON:  Chest CT 06/15/2018 and 06/17/2017 FINDINGS: Cardiovascular: The heart is normal in size. No pericardial effusion. Stable fusiform aneurysmal dilatation of the ascending aorta which measures a maximum of 4.3 cm and is stable. Stable advanced three-vessel coronary artery calcifications. Mediastinum/Nodes: Stable mediastinal and hilar lymph nodes. Pretracheal node on image number 77 measures 10 mm. 10.5 mm subcarinal lymph node on image number 89. Lungs/Pleura: Stable large partially calcified right hilar/infrahilar mass which measures approximately 5.8 x 4.2 cm and is unchanged. This obstructs the right middle lobe bronchus which is chronically collapsed/atelectatic. There is also partial obstruction of the right lower lobe bronchus which appears to contain a calcification or broncholith. Several right lower lobe bronchi are slightly distended and filled with soft tissue or inflammatory debris. This could be mucoid impaction or endobronchial spread of tumor. Associated mild inflammatory changes in the right lower lobe but no focal airspace consolidation to suggest pneumonia. Stable emphysematous changes and pulmonary scarring. Stable area of right apical scarring. No worrisome pulmonary lesions elsewhere. No pleural effusion. Upper Abdomen: No significant upper abdominal findings. Could not exclude mild cirrhosis. Dense atherosclerotic calcifications  involving the abdominal aorta and branch vessel ostia, particularly the SMA. Musculoskeletal: No chest wall mass, supraclavicular or axillary adenopathy. No significant bony findings. Large bridging syndesmophytes are noted in the cervical spine suspicious for a inflammatory arthropathy. IMPRESSION: 1. Stable large right hilar/infrahilar calcified mass consistent with patient's known pulmonary carcinoid tumor. There is also persistent obstruction of the right middle lobe bronchus with right middle lobe collapse/atelectasis. There is also partial obstruction of the right lower lobe bronchus by a large calcification and there are dilated obstructed right lower lobe bronchi. This could be due to endobronchial spread of tumor or mucoid impaction. 2. No new pulmonary lesions. 3. Stable emphysematous changes and pulmonary scarring. 4. Stable fusiform aneurysmal dilatation of the ascending thoracic aorta with maximum measurement of 4.3 cm. 5. Stable mediastinal and hilar lymph nodes. 6. Stable advanced atherosclerotic calcifications involving the thoracic and abdominal aorta and branch vessels including dense three-vessel coronary artery calcifications. Aortic Atherosclerosis (ICD10-I70.0) and Emphysema (ICD10-J43.9). Electronically Signed   By: Marijo Sanes M.D.   On: 10/29/2018 16:13   Ct Chest W Contrast  Addendum Date: 11/27/2018   ADDENDUM REPORT: 12/08/2018 09:42 ADDENDUM: Not mentioned above is a nasogastric tube which terminates in the esophagus, just above the craniocaudal level of the carina. This was advanced on subsequent radiographs. Electronically Signed  By: Abigail Miyamoto M.D.   On: 11/27/2018 09:42   Result Date: 11/17/2018 CLINICAL DATA:  Shortness of breath. COPD. Cirrhosis. Chronic alcohol abuse. Known right middle lobe pneumonia. On antibiotics. EXAM: CT CHEST WITH CONTRAST TECHNIQUE: Multidetector CT imaging of the chest was performed during intravenous contrast administration. CONTRAST:  28mL  OMNIPAQUE IOHEXOL 300 MG/ML  SOLN COMPARISON:  12/01/2018 chest radiograph. Most recent CT 10/29/2018. FINDINGS: Cardiovascular: Degraded exam, secondary to mild motion and patient arm position (not raised above the head). Advanced aortic and branch vessel atherosclerosis. Tortuous thoracic aorta. Moderate cardiomegaly, without pericardial effusion. Multivessel coronary artery atherosclerosis. Pulmonary artery enlargement, outflow tract 3.6 cm No central pulmonary embolism, on this non-dedicated study. Mediastinum/Nodes: No supraclavicular adenopathy. Borderline enlarged precarinal node of 10 mm is unchanged. A subcarinal node of 12 mm is similar to 11 mm on the prior. Partially calcified right hilar mass again identified. On the order of 6.0 x 4.5 cm today versus 5.8 x 4.2 cm on the prior. Lungs/Pleura: New trace right pleural fluid. Appropriate position of endotracheal tube, well above the carina. Right lower lobe obstruction or compression by tumor is felt to be slightly increased, including on image 77/3. Presumed scarring at the right apex, with a similar somewhat more nodular component medially, including at 9 mm on image 36/3. Centrilobular and paraseptal emphysema is moderate. Patchy posterior right upper lobe peribronchovascular airspace opacities. Development of posterior left upper lobe and dense left lower lobe consolidation compared to the prior CT. Significantly worsened right lower lobe aeration, with consolidation throughout. Persistent posterior right middle lobe collapse and volume loss. Upper Abdomen: Mild cirrhosis. Cholecystectomy. Normal imaged portions of the stomach, spleen, left adrenal gland, kidneys. Mild right adrenal nodularity is unchanged. Abdominal aortic atherosclerosis. Prominent porta hepatis nodes are likely reactive. Musculoskeletal: Remote left rib trauma. Moderate thoracic spondylosis. IMPRESSION: 1. Degradation, including motion and patient arm position. 2. Development of  multifocal pulmonary opacities, including dense consolidation in both lung bases. Findings are consistent with multifocal pneumonia or aspiration. 3. Right hilar partially calcified mass, consistent with the clinical history of carcinoid. This is similar to slightly increased compared to 10/29/2018. Borderline mediastinal adenopathy is unchanged. Secondary partial collapse of the right middle lobe is relatively similar. 4. Aortic atherosclerosis (ICD10-I70.0), coronary artery atherosclerosis and emphysema (ICD10-J43.9). 5. Pulmonary artery enlargement suggests pulmonary arterial hypertension. 6. Cirrhosis. Electronically Signed: By: Abigail Miyamoto M.D. On: 12/04/2018 09:23   Dg Chest Port 1 View  Result Date: 11/30/2018 CLINICAL DATA:  Evaluate endotracheal tube status. EXAM: PORTABLE CHEST 1 VIEW COMPARISON:  Earlier today at 0600 hours. FINDINGS: 640 hours. Endotracheal tube terminates 5.1 cm above carina.Mild cardiomegaly. Small right pleural effusion or pleural thickening, blunting the right costophrenic angle. Right base airspace disease. Hyperinflation. No congestive failure. No pneumothorax. IMPRESSION: Appropriate position of endotracheal tube, 5.1 cm above carina. Persistent right base airspace disease and pleural fluid or thickening. Electronically Signed   By: Abigail Miyamoto M.D.   On: 12/05/2018 07:15   Dg Chest Port 1 View  Result Date: 12/04/2018 CLINICAL DATA:  Respiratory distress. Carcinoid tumor. EXAM: PORTABLE CHEST 1 VIEW COMPARISON:  09/04/2018 radiography. Chest CT 10/29/2017 FINDINGS: Similar appearance to previous exams. Right hilar region mass with calcification better shown at CT. Chronic volume loss in the right lower lung with right pleural blunting. There could be coexistent pneumonia in this region. Some emphysema in the upper lungs. No acute finding on the left. IMPRESSION: Right hilar region mass with calcification better shown at CT. Chronic  volume loss in the right lower lung  with right pleural blunting. There could be coexistent pneumonia in this region. Electronically Signed   By: Nelson Chimes M.D.   On: 11/10/2018 06:53   Dg Abd Portable 1 View  Result Date: 11/14/2018 CLINICAL DATA:  Repositioning of nasogastric tube. EXAM: PORTABLE ABDOMEN - 1 VIEW COMPARISON:  Chest radiograph of earlier today. FINDINGS: Extensive support apparatus projecting over the upper abdomen, obscuring the distal portion of the nasogastric tube. The tube has been advanced, favored to terminate over the body of the stomach. No gross free intraperitoneal air. Bibasilar airspace disease has been detailed previously. IMPRESSION: Suboptimal exam, secondary to extensive overlying support apparatus. Nasogastric tube likely terminating at the body of the stomach. Electronically Signed   By: Abigail Miyamoto M.D.   On: 12/03/2018 09:39    Consults: Treatment Team:  Pccm, Ander Gaster, MD   Subjective:    Overnight Issues:  Remains intubated and sedated.  Wife states that the patient gets agitated if attempted to wean so we will switch to Precedex from propofol.  The patient cannot be bronchoscope due to having a 6.5 ET tube and family not desiring this intervention at present.  Will attempt SBT once able to interact more.  Currently he is heavily sedated. Objective:  Vital signs for last 24 hours: Temp:  [97.8 F (36.6 C)-98.7 F (37.1 C)] 97.8 F (36.6 C) (12/09 1900) Pulse Rate:  [51-76] 68 (12/09 2200) Resp:  [6-20] 19 (12/09 2200) BP: (96-123)/(54-70) 114/62 (12/09 2200) SpO2:  [90 %-98 %] 90 % (12/09 2200) FiO2 (%):  [40 %-50 %] 40 % (12/09 2033)  Hemodynamic parameters for last 24 hours:    Intake/Output from previous day: 12/08 0701 - 12/09 0700 In: 1093 [I.V.:3245.1; IV Piggyback:99.9] Out: 1150 [Urine:1150]  Intake/Output this shift: No intake/output data recorded.  Vent settings for last 24 hours: Vent Mode: PRVC FiO2 (%):  [40 %-50 %] 40 % Set Rate:  [20 bmp] 20 bmp Vt  Set:  [500 mL] 500 mL PEEP:  [5 cmH20] 5 cmH20 Plateau Pressure:  [18 cmH20] 18 cmH20  Physical Exam:  General: Sedated, intubated, mechanically ventilated. HEENT:    Periorbital edema, some tongue edema.   Trachea midline, no thyromegaly appreciated Cardiovascular:  Irregularly irregular rhythm with controlled ventricular response Abdominal:      Positive bowel sounds, soft exam Extremities:     2+ pitting edema Neurologic:      Patient is sedated on mechanical ventilation, limited exam Cutaneous:      Signs of chronic venous stasis   Assessment/Plan:    LOS: 1 day   Additional comments: ICU multidisciplinary rounds were performed.  Patient's wife was apprised at bedside.   Assessment and Plan:  1.  Acute hypoxic/hypercapnic respiratory failure respiratory failure requiring intubation.  Patient's PCO2 was 69 and pH was 7.21.  I reviewed chest x-ray and CT scan which reveals collapse of the basilar lung segments.  CT scan revealed calcified right infrahilar mass with some endoluminal narrowing posteriorly and medially past right mainstem bronchus and bronchus intermedius.  However it appears that this is a chronic process and the patient has had this issue for approximately 6 years. Unclear specific pathologic etiology possibilities do include carcinoid versus other slowly process.  Per the patient's wife he has had issues with difficulties doing bronchoscopy to include bleeding.  He has had prior tracheostomy due to poor weaning from mechanical ventilation.  His wishes were not to be reintubated.  We will  try to wean him off the ventilator as able.  At present, bronchoscopy will not be able to be performed with a #6.5 ET tube in place.  Given the calcifications in this mass the chances of being able to expand the bronchus with a stent will be difficult.  Continue antibiotics until cultures are known.  Continue bronchodilators, pulmonary toilet and IV steroids.  2. Chronic alcoholism.  Will  place patient on thiamine, multivitamins, fluid resuscitation and CIWA protocol.   He apparently had tried to discontinue this when he became delirious and decompensated at home.  3.  Atrial fibrillation.  Controlled ventricular response  4.  Elevated BNP.  Patient does have a history of coronary artery disease, most recent echo revealed reduced ejection fraction at 35 to 40% with some mitral valvular disease  Echocardiogram, shows no significant change.  5.  Periorbital and tongue edema.  The patient is on ACE inhibitor.  We will discontinue this for now.    Critical Care Total Time*: 75 Minutes  Vernard Gambles 11/16/2018  *Care during the described time interval was provided by me and/or other providers on the critical care team.  I have reviewed this patient's available data, including medical history, events of note, physical examination and test results as part of my evaluation.

## 2018-11-16 NOTE — Consult Note (Signed)
Pharmacy Antibiotic Note  Marvin Lewis is a 67 y.o. male admitted on 11/27/2018 with aspiration pneumonia.  Pharmacy has been consulted for Zosyn dosing.  Plan: Continue Zosyn 3.375g IV q8h (4 hour infusion).  Height: 5\' 10"  (177.8 cm) Weight: 252 lb 10.4 oz (114.6 kg) IBW/kg (Calculated) : 73  Temp (24hrs), Avg:98.1 F (36.7 C), Min:97.7 F (36.5 C), Max:98.4 F (36.9 C)  Recent Labs  Lab 11/17/2018 0618 12/03/2018 0625 11/16/18 0701  WBC 10.3  --  9.2  CREATININE 0.84  --  1.19  LATICACIDVEN  --  2.2* 1.7    Estimated Creatinine Clearance: 77.4 mL/min (by C-G formula based on SCr of 1.19 mg/dL).    Antimicrobials this admission: Zosyn 12/8 >>   Microbiology results: 12/8 BCx: NG TD  12/8 MRSA PCR: (-)  Thank you for allowing pharmacy to be a part of this patient's care.  Pernell Dupre, PharmD, BCPS Clinical Pharmacist 11/16/2018 12:58 PM

## 2018-11-16 NOTE — Care Management Note (Signed)
Case Management Note  Patient Details  Name: Marvin Lewis MRN: [id number] Date of Birth: 1951-08-19  Subjective/Objective:    Patient admitted to the ICU for Respiratory failure requiring intubation.  Patient is currently intubated and sedated, wife [name] and patient's aunt are at the bedside.  Patient lives with wife in [place].  Wife reports that the patient has been walking with a cane around the home, she bought a manual wheelchair for trips, the patient cannot walk for long distances before becoming winded.  Patient uses O2 at home 3 L, wife reports that the oxygen started only at night but patient has been using it more often during the day as well.  Oxygen company is [employer] and they have a Marine scientist for travel. Patient has been mostly taking sponge baths cannot tolerate standing in the shower.  Wife assists patient at home as needed and provides his transportation.  PCP verified as Dr. Sanda Klein, wife reports patient has several specialists, Dr. Vella Kohler for pulmonology, Dr. Ubaldo Glassing for cardiology.  Pharmacy is [facility] in [place].  Patient has had a lung mass for the past 6 or more years per the wife, she reports that the patient has needed to be intubated in the past and even had a trach at one time.  Palliative consult has been placed, wife would be interested in outpatient palliative care.  Wife reports that patient had [facility] in the past for home health services and would be agreeable to home health again. Wife states the patient would not want to be in a SNF or LTAC. RNCM will cont to follow.   Doran Clay RN BSN Care Management [phone]             Action/Plan:   Expected Discharge Date:                  Expected Discharge Plan:     In-House Referral:     Discharge planning Services     Post Acute Care Choice:    Choice offered to:     DME Arranged:    DME Agency:     HH Arranged:    HH Agency:     Status of Service:  In process, will  continue to follow  If discussed at Long Length of Stay Meetings, dates discussed:    Additional Comments:  [name], RN 11/16/2018, 12:21 PM

## 2018-11-16 NOTE — Progress Notes (Signed)
[facility] [place] NAME: Marvin Lewis    MR#:  [id number]  DATE OF BIRTH:  May 31, 1951  SUBJECTIVE:   Came in with respiratory distress and tremors. No family in the room. Patient remains intubated on the ventilator. IV sedation. REVIEW OF SYSTEMS:   Review of Systems  Unable to perform ROS: Intubated   Tolerating Diet:TF Tolerating PT: on the vent  DRUG ALLERGIES:  No Known Allergies  VITALS:  Blood pressure 109/64, pulse (!) 54, temperature 98.4 F (36.9 C), temperature source Oral, resp. rate 20, height 5\' 10"  (1.778 m), weight 114.6 kg, SpO2 96 %.  PHYSICAL EXAMINATION:   Physical Exam  GENERAL:  67 y.o.-year-old patient lying in the bed with no acute distress. Critically ill EYES: Pupils equal, round, reactive to light and accommodation. No scleral icterus.  HEENT: Head atraumatic, normocephalic. Oropharynx and nasopharynx clear. On the vent NECK:  Supple, no jugular venous distention. No thyroid enlargement, no tenderness.  LUNGS: Normal breath sounds bilaterally, no wheezing, rales, rhonchi. No use of accessory muscles of respiration.  CARDIOVASCULAR: S1, S2 normal. No murmurs, rubs, or gallops.  ABDOMEN: Soft, nontender, nondistended. Bowel sounds present. No organomegaly or mass.  EXTREMITIES: No cyanosis, clubbing or edema b/l.    NEUROLOGIC: sedated on the vent    PSYCHIATRIC:  patient is sedaed SKIN: No obvious rash, lesion, or ulcer.   LABORATORY PANEL:  CBC Recent Labs  Lab 11/16/18 0701  WBC 9.2  HGB 10.7*  HCT 33.1*  PLT 100*    Chemistries  Recent Labs  Lab 11/09/2018 0618 11/16/18 0701  NA 140 138  K 4.4 4.2  CL 101 105  CO2 27 24  GLUCOSE 179* 174*  BUN 22 31*  CREATININE 0.84 1.19  CALCIUM 8.7* 7.8*  MG  --  2.0  AST 71*  --   ALT 67*  --   ALKPHOS 105  --   BILITOT 0.9  --    Cardiac Enzymes Recent Labs  Lab 12/08/2018 0618  TROPONINI <0.03   RADIOLOGY:  Ct Chest W  Contrast  Addendum Date: 11/13/2018   ADDENDUM REPORT: 11/28/2018 09:42 ADDENDUM: Not mentioned above is a nasogastric tube which terminates in the esophagus, just above the craniocaudal level of the carina. This was advanced on subsequent radiographs. Electronically Signed   By: Abigail Miyamoto M.D.   On: 11/11/2018 09:42   Result Date: 11/23/2018 CLINICAL DATA:  Shortness of breath. COPD. Cirrhosis. Chronic alcohol abuse. Known right middle lobe pneumonia. On antibiotics. EXAM: CT CHEST WITH CONTRAST TECHNIQUE: Multidetector CT imaging of the chest was performed during intravenous contrast administration. CONTRAST:  90mL OMNIPAQUE IOHEXOL 300 MG/ML  SOLN COMPARISON:  11/30/2018 chest radiograph. Most recent CT 10/29/2018. FINDINGS: Cardiovascular: Degraded exam, secondary to mild motion and patient arm position (not raised above the head). Advanced aortic and branch vessel atherosclerosis. Tortuous thoracic aorta. Moderate cardiomegaly, without pericardial effusion. Multivessel coronary artery atherosclerosis. Pulmonary artery enlargement, outflow tract 3.6 cm No central pulmonary embolism, on this non-dedicated study. Mediastinum/Nodes: No supraclavicular adenopathy. Borderline enlarged precarinal node of 10 mm is unchanged. A subcarinal node of 12 mm is similar to 11 mm on the prior. Partially calcified right hilar mass again identified. On the order of 6.0 x 4.5 cm today versus 5.8 x 4.2 cm on the prior. Lungs/Pleura: New trace right pleural fluid. Appropriate position of endotracheal tube, well above the carina. Right lower lobe obstruction or compression by tumor is felt to be  slightly increased, including on image 77/3. Presumed scarring at the right apex, with a similar somewhat more nodular component medially, including at 9 mm on image 36/3. Centrilobular and paraseptal emphysema is moderate. Patchy posterior right upper lobe peribronchovascular airspace opacities. Development of posterior left upper  lobe and dense left lower lobe consolidation compared to the prior CT. Significantly worsened right lower lobe aeration, with consolidation throughout. Persistent posterior right middle lobe collapse and volume loss. Upper Abdomen: Mild cirrhosis. Cholecystectomy. Normal imaged portions of the stomach, spleen, left adrenal gland, kidneys. Mild right adrenal nodularity is unchanged. Abdominal aortic atherosclerosis. Prominent porta hepatis nodes are likely reactive. Musculoskeletal: Remote left rib trauma. Moderate thoracic spondylosis. IMPRESSION: 1. Degradation, including motion and patient arm position. 2. Development of multifocal pulmonary opacities, including dense consolidation in both lung bases. Findings are consistent with multifocal pneumonia or aspiration. 3. Right hilar partially calcified mass, consistent with the clinical history of carcinoid. This is similar to slightly increased compared to 10/29/2018. Borderline mediastinal adenopathy is unchanged. Secondary partial collapse of the right middle lobe is relatively similar. 4. Aortic atherosclerosis (ICD10-I70.0), coronary artery atherosclerosis and emphysema (ICD10-J43.9). 5. Pulmonary artery enlargement suggests pulmonary arterial hypertension. 6. Cirrhosis. Electronically Signed: By: Abigail Miyamoto M.D. On: 11/26/2018 09:23   Dg Chest Port 1 View  Result Date: 11/18/2018 CLINICAL DATA:  Evaluate endotracheal tube status. EXAM: PORTABLE CHEST 1 VIEW COMPARISON:  Earlier today at 0600 hours. FINDINGS: 640 hours. Endotracheal tube terminates 5.1 cm above carina.Mild cardiomegaly. Small right pleural effusion or pleural thickening, blunting the right costophrenic angle. Right base airspace disease. Hyperinflation. No congestive failure. No pneumothorax. IMPRESSION: Appropriate position of endotracheal tube, 5.1 cm above carina. Persistent right base airspace disease and pleural fluid or thickening. Electronically Signed   By: Abigail Miyamoto M.D.   On:  11/14/2018 07:15   Dg Chest Port 1 View  Result Date: 11/11/2018 CLINICAL DATA:  Respiratory distress. Carcinoid tumor. EXAM: PORTABLE CHEST 1 VIEW COMPARISON:  09/04/2018 radiography. Chest CT 10/29/2017 FINDINGS: Similar appearance to previous exams. Right hilar region mass with calcification better shown at CT. Chronic volume loss in the right lower lung with right pleural blunting. There could be coexistent pneumonia in this region. Some emphysema in the upper lungs. No acute finding on the left. IMPRESSION: Right hilar region mass with calcification better shown at CT. Chronic volume loss in the right lower lung with right pleural blunting. There could be coexistent pneumonia in this region. Electronically Signed   By: Nelson Chimes M.D.   On: 12/04/2018 06:53   Dg Abd Portable 1 View  Result Date: 12/08/2018 CLINICAL DATA:  Repositioning of nasogastric tube. EXAM: PORTABLE ABDOMEN - 1 VIEW COMPARISON:  Chest radiograph of earlier today. FINDINGS: Extensive support apparatus projecting over the upper abdomen, obscuring the distal portion of the nasogastric tube. The tube has been advanced, favored to terminate over the body of the stomach. No gross free intraperitoneal air. Bibasilar airspace disease has been detailed previously. IMPRESSION: Suboptimal exam, secondary to extensive overlying support apparatus. Nasogastric tube likely terminating at the body of the stomach. Electronically Signed   By: Abigail Miyamoto M.D.   On: 12/05/2018 09:39   ASSESSMENT AND PLAN:  Aristide Waggle  is a 67 y.o. male with a known history of chronic respiratory failure secondary to COPD severe on home oxygen with ongoing tobacco abuse, chronic atrial fibrillation not on anticoagulation secondary to hemoptysis, aortic aneurysm 4.4 cm, chronic alcohol abuse with active drinking, hypertension comes to the emergency  room after patient's wife found him being very restless this morning around 5 AM and short winded with  diaphoresis and tremors. In the ER patient was found to be hypoxic with sats durations in the 70s. Patient was intubated placed on the ventilator.  1. acute on chronic hypoxic respiratory failure secondary to COPD exacerbation/possible post obstructive pneumonia/aspiration -patient intubated on the ventilator -discussed with ICU attending Dr. Jefferson Fuel -IV Zosyn, IV Solu-Medrol, inhalers nebulizer -vent management per ICU attending  2. Hypotension -patient on IV pressers--- blood pressure improved wean off IV pressers  3. Ongoing alcohol abuse -according to the wife patient drinks about 1 to 2 pints of vodka a week and significant amount of beer on a daily basis -watch for withdrawal -On IV precedex  4. Chronic atrial fibrillation -holding sotalol ?prolong QTC -patient not on anticoagulation due to history of hemoptysis -follows with Dr. Ubaldo Glassing  5. Diabetes type II -sliding scale insulin and Lantos  6. Known hilar/perihilar mass suspected carcinoid -follows with Dr. Raul Del--- limited options given poor lung condition (clinic notes)  7 elevated transaminases secondary to alcohol abuse  8. DVT prophylaxis subcu heparin  9. Known ascending aortic aneurysm 4.4 cm-- for wife this is followed by Dr. Ubaldo Glassing  Case discussed with Care Management/Social Worker. Management plans discussed with the patient, family and they are in agreement.  CODE STATUS: full  DVT Prophylaxis: heparin  TOTAL TIME TAKING CARE OF THIS PATIENT: *25* minutes.  >50% time spent on counselling and coordination of care  POSSIBLE D/C IN ??* DAYS, DEPENDING ON CLINICAL CONDITION.  Note: This dictation was prepared with Dragon dictation along with smaller phrase technology. Any transcriptional errors that result from this process are unintentional.  Fritzi Mandes M.D on 11/16/2018 at 1:49 PM  Between 7am to 6pm - Pager - [phone]  After 6pm go to [email or web address] - password [email or web address]  [facility]  Office  [phone]  CC: Primary care physician; Arnetha Courser, MDPatient ID: Marvin Lewis, male   DOB: July 29, 1951, 67 y.o.   MRN: [id number]

## 2018-11-17 ENCOUNTER — Inpatient Hospital Stay: Payer: Medicare Other

## 2018-11-17 DIAGNOSIS — F10239 Alcohol dependence with withdrawal, unspecified: Secondary | ICD-10-CM

## 2018-11-17 DIAGNOSIS — E872 Acidosis: Secondary | ICD-10-CM

## 2018-11-17 DIAGNOSIS — Z515 Encounter for palliative care: Secondary | ICD-10-CM

## 2018-11-17 DIAGNOSIS — J9601 Acute respiratory failure with hypoxia: Secondary | ICD-10-CM

## 2018-11-17 DIAGNOSIS — Z7189 Other specified counseling: Secondary | ICD-10-CM

## 2018-11-17 LAB — MAGNESIUM: Magnesium: 1.9 mg/dL (ref 1.7–2.4)

## 2018-11-17 LAB — GLUCOSE, CAPILLARY
GLUCOSE-CAPILLARY: 190 mg/dL — AB (ref 70–99)
Glucose-Capillary: 187 mg/dL — ABNORMAL HIGH (ref 70–99)
Glucose-Capillary: 229 mg/dL — ABNORMAL HIGH (ref 70–99)
Glucose-Capillary: 262 mg/dL — ABNORMAL HIGH (ref 70–99)
Glucose-Capillary: 286 mg/dL — ABNORMAL HIGH (ref 70–99)
Glucose-Capillary: 317 mg/dL — ABNORMAL HIGH (ref 70–99)

## 2018-11-17 LAB — PHOSPHORUS: Phosphorus: 4.6 mg/dL (ref 2.5–4.6)

## 2018-11-17 MED ORDER — FUROSEMIDE 10 MG/ML IJ SOLN
40.0000 mg | Freq: Once | INTRAMUSCULAR | Status: AC
  Administered 2018-11-17: 40 mg via INTRAVENOUS
  Filled 2018-11-17: qty 4

## 2018-11-17 MED ORDER — INSULIN GLARGINE 100 UNIT/ML ~~LOC~~ SOLN
15.0000 [IU] | Freq: Every day | SUBCUTANEOUS | Status: DC
  Administered 2018-11-17: 15 [IU] via SUBCUTANEOUS
  Filled 2018-11-17 (×2): qty 0.15

## 2018-11-17 MED ORDER — HYDRALAZINE HCL 20 MG/ML IJ SOLN
10.0000 mg | INTRAMUSCULAR | Status: DC | PRN
  Administered 2018-11-17 – 2018-11-19 (×4): 10 mg via INTRAVENOUS
  Filled 2018-11-17 (×4): qty 1

## 2018-11-17 MED ORDER — POLYETHYLENE GLYCOL 3350 17 G PO PACK
17.0000 g | PACK | Freq: Every day | ORAL | Status: DC
  Administered 2018-11-17 – 2018-11-18 (×2): 17 g
  Filled 2018-11-17 (×2): qty 1

## 2018-11-17 NOTE — Progress Notes (Signed)
Inpatient Diabetes Program Recommendations  AACE/ADA: New Consensus Statement on Inpatient Glycemic Control (2015)  Target Ranges:  Prepandial:   less than 140 mg/dL      Peak postprandial:   less than 180 mg/dL (1-2 hours)      Critically ill patients:  140 - 180 mg/dL   Results for TANER, RZEPKA" (MRN [id number]) as of 11/17/2018 08:19  Ref. Range 11/16/2018 00:21 11/16/2018 03:43 11/16/2018 07:33 11/16/2018 12:28 11/16/2018 16:28 11/16/2018 19:50  Glucose-Capillary Latest Ref Range: 70 - 99 mg/dL 167 (H)  3 units NOVOLOG  167 (H)  3 units NOVOLOG  160 (H)  3 units NOVOLOG  104 (H) 110 (H) 152 (H)  3 units NOVOLOG    Results for RAMIEL, FORTI" (MRN [id number]) as of 11/17/2018 08:19  Ref. Range 11/16/2018 23:24 11/17/2018 03:44 11/17/2018 07:42  Glucose-Capillary Latest Ref Range: 70 - 99 mg/dL 270 (H)  8 units NOVOLOG  286 (H)  8 units NOVOLOG  262 (H)  8 units NOVOLOG     Admit with: Acute on chronic hypoxic respiratory failure secondary to COPD exacerbation/possible post obstructive pneumonia/aspiration  History: DM, COPD, ETOH Abuse  Home DM Meds: Tresiba (insulin degludec) 50 units QHS       Novolog 12 units Breakfast/ 12 units Lunch/ 20 units Dinner  Current Orders: Novolog Moderate Correction Scale/ SSI (0-15 units) Q4 hours      Remains intubated.  Tube feeds running 20cc/hr  Getting Solumedrol 60 mg Daily.  Meeting with Palliative Care Team today.     MD- Note patient started on Solumedrol 60 mg Daily--Got 1st dose yesterday at 6pm.  CBGs now >200 mg/dl since Midnight last PM likely from steroids.  If within goals of care for this patient, may consider starting weight based dose of basal insulin while patient getting IV steroids.  Takes large dose of basal insulin at home Tyler Aas 50 units QHS).  This dose may be way too much.  Recommend Lantus 18 units QHS (0.15 units/kg dosing based on weight of 117 kg)    --Will  follow patient during hospitalization--  [name] RN, MSN, CDE Diabetes Coordinator Inpatient Glycemic Control Team Team Pager: [phone] (8a-5p)

## 2018-11-17 NOTE — Consult Note (Signed)
Pharmacy Antibiotic Note  NAFTULA DONAHUE is a 67 y.o. male admitted on 11/14/2018 with aspiration pneumonia.  Pharmacy has been consulted for Zosyn dosing.  Plan: Continue Zosyn 3.375g IV q8h (4 hour infusion).  Height: 5\' 10"  (177.8 cm) Weight: 259 lb 0.7 oz (117.5 kg) IBW/kg (Calculated) : 73  Temp (24hrs), Avg:98.5 F (36.9 C), Min:97.8 F (36.6 C), Max:99.4 F (37.4 C)  Recent Labs  Lab 11/27/2018 0618 12/06/2018 0625 11/16/18 0701  WBC 10.3  --  9.2  CREATININE 0.84  --  1.19  LATICACIDVEN  --  2.2* 1.7    Estimated Creatinine Clearance: 78.4 mL/min (by C-G formula based on SCr of 1.19 mg/dL).    Antimicrobials this admission: Zosyn 12/8 >>   Microbiology results: 12/8 BCx: NG TD  12/8 MRSA PCR: (-)  Thank you for allowing pharmacy to be a part of this patient's care.  Pernell Dupre, PharmD, BCPS Clinical Pharmacist 11/17/2018 1:23 PM

## 2018-11-17 NOTE — Progress Notes (Signed)
Follow up - Critical Care Medicine Note  Patient Details:    Marvin Lewis is an 67 y.o. male. Mr. Marvin Lewis is a 67 year old gentleman with a past medical history remarkable for severe COPD on home oxygen therapy being followed by Dr. Raul Del, a sending aortic aneurysm, atrial fibrillation, not on anticoagulation secondary to hemoptysis, coronary artery disease, cirrhosis, pretension, hyperlipidemia, CVA, chronic alcoholism has been treated for recurrent outpatient pneumonia by Dr. Raul Del.  Per wife patient has a narrowed right bronchus from a hilar mass.  Process has been considered for stenting in the past.  She states that he developed progressive increasing shortness of breath prompting coming to the emergency room where he was found to be hypoxemic and hypercapnic.  He was also felt to be in withdrawal.  He subsequently was intubated placed on mechanical ventilation.  He had transient hypotension most likely secondary to positive pressure and sedation and was started briefly on norepinephrine  Lines, Airways, Drains: Airway (Active)     Airway 6.5 mm (Active)  Secured at (cm) 24 cm 11/16/2018  8:33 PM  Measured From Lips 11/16/2018  8:33 PM  [place] 11/16/2018  8:33 PM  Secured By [employer] 11/16/2018  8:33 PM  Tube Holder Repositioned Yes 11/16/2018  8:33 PM  Cuff Pressure (cm H2O) 28 cm H2O 11/16/2018  8:33 PM  Site Condition Dry 11/16/2018  8:33 PM     Urethral Catheter april, rn 16 Fr. (Active)  Indication for Insertion or Continuance of Catheter Unstable critical patients (first 24-48 hours) 11/16/2018  4:00 PM  Site Assessment Clean;Intact 11/16/2018  4:00 PM  Catheter Maintenance Bag below level of bladder;Catheter secured;Drainage bag/tubing not touching floor;Insertion date on drainage bag;No dependent loops;Seal intact 11/16/2018  8:00 PM  Collection Container Standard drainage bag 11/16/2018  4:00 PM  Securement Method Securing device (Describe)  11/16/2018  4:00 PM  Urinary Catheter Interventions Unclamped 11/16/2018  4:00 PM  Output (mL) 225 mL 11/16/2018  6:18 PM    Anti-infectives:  Anti-infectives (From admission, onward)   Start     Dose/Rate Route Frequency Ordered Stop   12/05/2018 1400  piperacillin-tazobactam (ZOSYN) IVPB 3.375 g     3.375 g 12.5 mL/hr over 240 Minutes Intravenous Every 8 hours 11/22/2018 0912     11/08/2018 0630  piperacillin-tazobactam (ZOSYN) IVPB 3.375 g     3.375 g 100 mL/hr over 30 Minutes Intravenous  Once 11/30/2018 6270 11/27/2018 0854      Microbiology: Results for orders placed or performed during the hospital encounter of 11/21/2018  MRSA PCR Screening     Status: None   Collection Time: 11/23/2018 10:29 AM  Result Value Ref Range Status   MRSA by PCR NEGATIVE NEGATIVE Final    Comment:        The GeneXpert MRSA Assay (FDA approved for NASAL specimens only), is one component of a comprehensive MRSA colonization surveillance program. It is not intended to diagnose MRSA infection nor to guide or monitor treatment for MRSA infections. Performed at [facility], [address] [place], [place] [address]   Culture, blood (routine x 2)     Status: None (Preliminary result)   Collection Time: 11/14/2018  6:33 PM  Result Value Ref Range Status   Specimen Description BLOOD R ARM  Final   Special Requests   Final    BOTTLES DRAWN AEROBIC AND ANAEROBIC Blood Culture adequate volume   Culture   Final    NO GROWTH 2 DAYS Performed at  [facility], [address], [place], [place] [address]    Report Status PENDING  Incomplete  Culture, blood (routine x 2)     Status: None (Preliminary result)   Collection Time: 12/05/2018  7:16 PM  Result Value Ref Range Status   Specimen Description BLOOD RIGHT ANTECUBITAL  Final   Special Requests   Final    BOTTLES DRAWN AEROBIC AND ANAEROBIC Blood Culture adequate volume   Culture   Final    NO GROWTH 2 DAYS Performed at [facility], [address] [place], [place] [address]    Report Status PENDING  Incomplete    Best Practice/Protocols:  VTE Prophylaxis: Heparin (SQ) GI Prophylaxis: Proton Pump Inhibitor   Events:   Studies: Ct Chest Wo Contrast  Result Date: 10/29/2018 CLINICAL DATA:  Shortness of breath, right-sided chest pain and pneumonia. Patient with known history of pulmonary carcinoid tumor. EXAM: CT CHEST WITHOUT CONTRAST TECHNIQUE: Multidetector CT imaging of the chest was performed following the standard protocol without IV contrast. COMPARISON:  Chest CT 06/15/2018 and 06/17/2017 FINDINGS: Cardiovascular: The heart is normal in size. No pericardial effusion. Stable fusiform aneurysmal dilatation of the ascending aorta which measures a maximum of 4.3 cm and is stable. Stable advanced three-vessel coronary artery calcifications. Mediastinum/Nodes: Stable mediastinal and hilar lymph nodes. Pretracheal node on image number 77 measures 10 mm. 10.5 mm subcarinal lymph node on image number 89. Lungs/Pleura: Stable large partially calcified right hilar/infrahilar mass which measures approximately 5.8 x 4.2 cm and is unchanged. This obstructs the right middle lobe bronchus which is chronically collapsed/atelectatic. There is also partial obstruction of the right lower lobe bronchus which appears to contain a calcification or broncholith. Several right lower lobe bronchi are slightly distended and filled with soft tissue or inflammatory debris. This could be mucoid impaction or endobronchial spread of tumor. Associated mild inflammatory changes in the right lower lobe but no focal airspace consolidation to suggest pneumonia. Stable emphysematous changes and pulmonary scarring. Stable area of right apical scarring. No worrisome pulmonary lesions elsewhere. No pleural effusion. Upper Abdomen: No significant upper abdominal findings. Could not exclude mild cirrhosis. Dense atherosclerotic calcifications involving the  abdominal aorta and branch vessel ostia, particularly the SMA. Musculoskeletal: No chest wall mass, supraclavicular or axillary adenopathy. No significant bony findings. Large bridging syndesmophytes are noted in the cervical spine suspicious for a inflammatory arthropathy. IMPRESSION: 1. Stable large right hilar/infrahilar calcified mass consistent with patient's known pulmonary carcinoid tumor. There is also persistent obstruction of the right middle lobe bronchus with right middle lobe collapse/atelectasis. There is also partial obstruction of the right lower lobe bronchus by a large calcification and there are dilated obstructed right lower lobe bronchi. This could be due to endobronchial spread of tumor or mucoid impaction. 2. No new pulmonary lesions. 3. Stable emphysematous changes and pulmonary scarring. 4. Stable fusiform aneurysmal dilatation of the ascending thoracic aorta with maximum measurement of 4.3 cm. 5. Stable mediastinal and hilar lymph nodes. 6. Stable advanced atherosclerotic calcifications involving the thoracic and abdominal aorta and branch vessels including dense three-vessel coronary artery calcifications. Aortic Atherosclerosis (ICD10-I70.0) and Emphysema (ICD10-J43.9). Electronically Signed   By: Marijo Sanes M.D.   On: 10/29/2018 16:13   Ct Chest W Contrast  Addendum Date: 11/27/2018   ADDENDUM REPORT: 11/12/2018 09:42 ADDENDUM: Not mentioned above is a nasogastric tube which terminates in the esophagus, just above the craniocaudal level of the carina. This was advanced on subsequent radiographs. Electronically Signed   By: Marylyn Ishihara  Jobe Igo M.D.   On: 11/30/2018 09:42   Result Date: 11/25/2018 CLINICAL DATA:  Shortness of breath. COPD. Cirrhosis. Chronic alcohol abuse. Known right middle lobe pneumonia. On antibiotics. EXAM: CT CHEST WITH CONTRAST TECHNIQUE: Multidetector CT imaging of the chest was performed during intravenous contrast administration. CONTRAST:  40mL OMNIPAQUE  IOHEXOL 300 MG/ML  SOLN COMPARISON:  11/10/2018 chest radiograph. Most recent CT 10/29/2018. FINDINGS: Cardiovascular: Degraded exam, secondary to mild motion and patient arm position (not raised above the head). Advanced aortic and branch vessel atherosclerosis. Tortuous thoracic aorta. Moderate cardiomegaly, without pericardial effusion. Multivessel coronary artery atherosclerosis. Pulmonary artery enlargement, outflow tract 3.6 cm No central pulmonary embolism, on this non-dedicated study. Mediastinum/Nodes: No supraclavicular adenopathy. Borderline enlarged precarinal node of 10 mm is unchanged. A subcarinal node of 12 mm is similar to 11 mm on the prior. Partially calcified right hilar mass again identified. On the order of 6.0 x 4.5 cm today versus 5.8 x 4.2 cm on the prior. Lungs/Pleura: New trace right pleural fluid. Appropriate position of endotracheal tube, well above the carina. Right lower lobe obstruction or compression by tumor is felt to be slightly increased, including on image 77/3. Presumed scarring at the right apex, with a similar somewhat more nodular component medially, including at 9 mm on image 36/3. Centrilobular and paraseptal emphysema is moderate. Patchy posterior right upper lobe peribronchovascular airspace opacities. Development of posterior left upper lobe and dense left lower lobe consolidation compared to the prior CT. Significantly worsened right lower lobe aeration, with consolidation throughout. Persistent posterior right middle lobe collapse and volume loss. Upper Abdomen: Mild cirrhosis. Cholecystectomy. Normal imaged portions of the stomach, spleen, left adrenal gland, kidneys. Mild right adrenal nodularity is unchanged. Abdominal aortic atherosclerosis. Prominent porta hepatis nodes are likely reactive. Musculoskeletal: Remote left rib trauma. Moderate thoracic spondylosis. IMPRESSION: 1. Degradation, including motion and patient arm position. 2. Development of multifocal  pulmonary opacities, including dense consolidation in both lung bases. Findings are consistent with multifocal pneumonia or aspiration. 3. Right hilar partially calcified mass, consistent with the clinical history of carcinoid. This is similar to slightly increased compared to 10/29/2018. Borderline mediastinal adenopathy is unchanged. Secondary partial collapse of the right middle lobe is relatively similar. 4. Aortic atherosclerosis (ICD10-I70.0), coronary artery atherosclerosis and emphysema (ICD10-J43.9). 5. Pulmonary artery enlargement suggests pulmonary arterial hypertension. 6. Cirrhosis. Electronically Signed: By: Abigail Miyamoto M.D. On: 12/03/2018 09:23   Dg Chest Port 1 View  Result Date: 11/17/2018 CLINICAL DATA:  Respiratory failure EXAM: PORTABLE CHEST 1 VIEW COMPARISON:  11/22/2018 FINDINGS: Endotracheal tube in good position. NG tube in place with tip not visualized. Progression of bibasilar airspace disease. Progressive vascular congestion with possible edema. IMPRESSION: Endotracheal tube in good position. Progression of bilateral pleural effusions and bibasilar airspace disease. Progressive vascular congestion. Possible fluid overload versus pneumonia. Electronically Signed   By: Franchot Gallo M.D.   On: 11/17/2018 20:43   Dg Chest Port 1 View  Result Date: 11/17/2018 CLINICAL DATA:  Evaluate endotracheal tube status. EXAM: PORTABLE CHEST 1 VIEW COMPARISON:  Earlier today at 0600 hours. FINDINGS: 640 hours. Endotracheal tube terminates 5.1 cm above carina.Mild cardiomegaly. Small right pleural effusion or pleural thickening, blunting the right costophrenic angle. Right base airspace disease. Hyperinflation. No congestive failure. No pneumothorax. IMPRESSION: Appropriate position of endotracheal tube, 5.1 cm above carina. Persistent right base airspace disease and pleural fluid or thickening. Electronically Signed   By: Abigail Miyamoto M.D.   On: 11/23/2018 07:15   Dg Chest Port 1  View  Result Date: 12/07/2018 CLINICAL DATA:  Respiratory distress. Carcinoid tumor. EXAM: PORTABLE CHEST 1 VIEW COMPARISON:  09/04/2018 radiography. Chest CT 10/29/2017 FINDINGS: Similar appearance to previous exams. Right hilar region mass with calcification better shown at CT. Chronic volume loss in the right lower lung with right pleural blunting. There could be coexistent pneumonia in this region. Some emphysema in the upper lungs. No acute finding on the left. IMPRESSION: Right hilar region mass with calcification better shown at CT. Chronic volume loss in the right lower lung with right pleural blunting. There could be coexistent pneumonia in this region. Electronically Signed   By: Nelson Chimes M.D.   On: 11/12/2018 06:53   Dg Abd Portable 1 View  Result Date: 11/17/2018 CLINICAL DATA:  Repositioning of nasogastric tube. EXAM: PORTABLE ABDOMEN - 1 VIEW COMPARISON:  Chest radiograph of earlier today. FINDINGS: Extensive support apparatus projecting over the upper abdomen, obscuring the distal portion of the nasogastric tube. The tube has been advanced, favored to terminate over the body of the stomach. No gross free intraperitoneal air. Bibasilar airspace disease has been detailed previously. IMPRESSION: Suboptimal exam, secondary to extensive overlying support apparatus. Nasogastric tube likely terminating at the body of the stomach. Electronically Signed   By: Abigail Miyamoto M.D.   On: 11/25/2018 09:39    Consults: Treatment Team:  Pccm, Ander Gaster, MD   Subjective:    Overnight Issues:  Remains intubated and sedated.  Propofol being switched over to Precedex.  Patient did not tolerate SBT due to agitation. Objective:  Vital signs for last 24 hours: Temp:  [98.2 F (36.8 C)-99.4 F (37.4 C)] 98.6 F (37 C) (12/10 2000) Pulse Rate:  [58-68] 65 (12/10 2100) Resp:  [11-21] 15 (12/10 2100) BP: (109-178)/(57-92) 156/85 (12/10 2100) SpO2:  [88 %-98 %] 98 % (12/10 2100) FiO2 (%):  [50  %-100 %] 100 % (12/10 2021) Weight:  [117.5 kg] 117.5 kg (12/10 0354)  Hemodynamic parameters for last 24 hours:  Reviewed.  Not requiring pressors.  Intake/Output from previous day: 12/09 0701 - 12/10 0700 In: 3951.1 [I.V.:3551.8; NG/GT:323; IV Piggyback:76.4] Out: 1925 [Urine:1925]  Intake/Output this shift: Total I/O In: 603.3 [I.V.:323.3; NG/GT:280] Out: -   Vent settings for last 24 hours: Vent Mode: PRVC FiO2 (%):  [50 %-100 %] 100 % Set Rate:  [20 bmp] 20 bmp Vt Set:  [500 mL] 500 mL PEEP:  [5 cmH20] 5 cmH20 Plateau Pressure:  [16 NFA21-30 cmH20] 18 cmH20  Physical Exam:  General: Sedated, intubated, mechanically ventilated.  Becomes agitated when sedation decreased. HEENT:    Periorbital edema, some tongue edema.   Trachea midline, no thyromegaly appreciated Cardiovascular:  Irregularly irregular rhythm with controlled ventricular response Abdominal:      Positive bowel sounds, soft exam Extremities:     2+ pitting edema Neurologic:      Patient is sedated on mechanical ventilation, limited exam, moves all 4 spontaneously, agitated when sedation decreased. Cutaneous:      Signs of chronic venous stasis   Assessment/Plan:    LOS: 2 days   Additional comments: ICU multidisciplinary rounds were performed.  Patient's wife was apprised at bedside.   Assessment and Plan:  1.  Acute hypoxic/hypercapnic respiratory failure respiratory failure requiring intubation.  Patient's PCO2 was 69 and pH was 7.21.  I reviewed chest x-ray and CT scan which reveals collapse of the basilar lung segments.  CT scan revealed calcified right infrahilar mass with some endoluminal narrowing posteriorly and medially past right mainstem bronchus  and bronchus intermedius.  However it appears that this is a chronic process and the patient has had this issue for approximately 6 years. Unclear specific pathologic etiology possibilities do include carcinoid versus other slowly process.  Per the  patient's wife he has had issues with difficulties doing bronchoscopy to include bleeding.  He has had prior tracheostomy due to poor weaning from mechanical ventilation.  His wishes were not to be reintubated.  We will try to wean him off the ventilator as able.  At present, bronchoscopy will not be able to be performed with a #6.5 ET tube in place.  Given the calcifications in this mass the chances of being able to expand the bronchus with a stent will be difficult.  Continue antibiotics until cultures are known.  Continue bronchodilators, pulmonary toilet and IV steroids.  Attempting to perform SVTs but the patient still becomes agitated.  2. Chronic alcoholism, he is currently in withdrawal.  Continue thiamine, multivitamins, and CIWA protocol.   He apparently had tried to discontinue this when he became delirious and decompensated at home.  Given his agitation when sedation is decreased suspect that delirium is still an issue.  Continue supportive care.  3.  Atrial fibrillation.  Controlled ventricular response.  4.  Elevated BNP, he received Lasix.  Patient does have a history of coronary artery disease, most recent echo revealed reduced ejection fraction at 35 to 40% with some mitral valvular disease  Echocardiogram, shows no significant change.  5.  Periorbital and tongue edema, improved today.  With holding ACE inhibitor's.  Critical Care Total Time*: 40 Minutes  Vernard Gambles 11/17/2018  *Care during the described time interval was provided by me and/or other providers on the critical care team.  I have reviewed this patient's available data, including medical history, events of note, physical examination and test results as part of my evaluation.

## 2018-11-17 NOTE — Progress Notes (Signed)
[facility] NAME: Marvin Lewis    MR#:  [id number]  DATE OF BIRTH:  1951-04-01  SUBJECTIVE:   Patient remains intubated on the ventilator. IV sedation. REVIEW OF SYSTEMS:   Review of Systems  Unable to perform ROS: Intubated   Tolerating Diet:TF Tolerating PT: on the vent  DRUG ALLERGIES:  No Known Allergies  VITALS:  Blood pressure (!) 178/89, pulse 65, temperature 98.3 F (36.8 C), temperature source Axillary, resp. rate 13, height 5\' 10"  (1.778 m), weight 117.5 kg, SpO2 92 %.  PHYSICAL EXAMINATION:   Physical Exam  GENERAL:  67 y.o.-year-old patient lying in the bed with no acute distress. Critically ill EYES: Pupils equal, round, reactive to light and accommodation. No scleral icterus.  HEENT: Head atraumatic, normocephalic. Oropharynx and nasopharynx clear. On the vent NECK:  Supple, no jugular venous distention. No thyroid enlargement, no tenderness.  LUNGS: Normal breath sounds bilaterally, no wheezing, rales, rhonchi. No use of accessory muscles of respiration.  CARDIOVASCULAR: S1, S2 normal. No murmurs, rubs, or gallops.  ABDOMEN: Soft, nontender, nondistended. Bowel sounds present. No organomegaly or mass.  EXTREMITIES: No cyanosis, clubbing or edema b/l.    NEUROLOGIC: sedated on the vent    PSYCHIATRIC:  patient is sedaed SKIN: No obvious rash, lesion, or ulcer.   LABORATORY PANEL:  CBC Recent Labs  Lab 11/16/18 0701  WBC 9.2  HGB 10.7*  HCT 33.1*  PLT 100*    Chemistries  Recent Labs  Lab 12/05/2018 0618 11/16/18 0701 11/17/18 0533  NA 140 138  --   K 4.4 4.2  --   CL 101 105  --   CO2 27 24  --   GLUCOSE 179* 174*  --   BUN 22 31*  --   CREATININE 0.84 1.19  --   CALCIUM 8.7* 7.8*  --   MG  --  2.0 1.9  AST 71*  --   --   ALT 67*  --   --   ALKPHOS 105  --   --   BILITOT 0.9  --   --    Cardiac Enzymes Recent Labs  Lab 11/14/2018 0618  TROPONINI <0.03   RADIOLOGY:   No results found. ASSESSMENT AND PLAN:  Marvin Lewis  is a 67 y.o. male with a known history of chronic respiratory failure secondary to COPD severe on home oxygen with ongoing tobacco abuse, chronic atrial fibrillation not on anticoagulation secondary to hemoptysis, aortic aneurysm 4.4 cm, chronic alcohol abuse with active drinking, hypertension comes to the emergency room after patient's wife found him being very restless this morning around 5 AM and short winded with diaphoresis and tremors. In the ER patient was found to be hypoxic with sats durations in the 70s. Patient was intubated placed on the ventilator.  1. acute on chronic hypoxic respiratory failure secondary to COPD exacerbation/possible post obstructive pneumonia/aspiration -patient intubated on the ventilator -discussed with ICU attending Dr. Jefferson Fuel -IV Zosyn, IV Solu-Medrol, inhalers nebulizer -vent management per ICU attending  2. Hypotension -patient on IV pressers--- blood pressure improved wean off IV pressers  3. Ongoing alcohol abuse -according to the wife patient drinks about 1 to 2 pints of vodka a week and significant amount of beer on a daily basis -watch for withdrawal -On IV precedex  4. Chronic atrial fibrillation -holding sotalol ?prolong QTC -patient not on anticoagulation due to history of hemoptysis -follows with Dr. Ubaldo Glassing  5. Diabetes type II -sliding  scale insulin and Lantos  6. Known hilar/perihilar mass suspected carcinoid -follows with Dr. Raul Del--- limited options given poor lung condition (clinic notes)  7 elevated transaminases secondary to alcohol abuse  8. DVT prophylaxis subcu heparin  9. Known ascending aortic aneurysm 4.4 cm-- for wife this is followed by Dr. Ubaldo Glassing  Case discussed with Care Management/Social Worker. Management plans discussed with the patient, family and they are in agreement.  CODE STATUS: full  DVT Prophylaxis: heparin  TOTAL TIME TAKING CARE  OF THIS PATIENT: *25* minutes.  >50% time spent on counselling and coordination of care  POSSIBLE D/C IN ??* DAYS, DEPENDING ON CLINICAL CONDITION.  Note: This dictation was prepared with Dragon dictation along with smaller phrase technology. Any transcriptional errors that result from this process are unintentional.  Fritzi Mandes M.D on 11/17/2018 at 4:17 PM  Between 7am to 6pm - Pager - [phone]  After 6pm go to [email or web address] - password [email or web address] [facility]  Office  [phone]  CC: Primary care physician; Arnetha Courser, MDPatient ID: Marvin Lewis, male   DOB: 24-May-1951, 67 y.o.   MRN: [id number]

## 2018-11-17 NOTE — Progress Notes (Addendum)
[facility] Progress Note Patient Name: Marvin Lewis DOB: 03/22/1951 MRN: [id number]   Date of Service  11/17/2018  HPI/Events of Note  Discussed with RN that pt was placed back on 100% FIO2.  PEEP remains at 5.    eICU Interventions  Check CXR.  Pt may benefit from diuretics.   10:11 PM Reviewed CXR - showing bilateral pleural effusion. SBP >150s.  Plan> Give Lasix 40mg  IVx1. Monitor I&Os.   Intervention Category Major Interventions: Respiratory failure - evaluation and management Minor Interventions: Clinical assessment - ordering diagnostic tests  Elsie Lincoln 11/17/2018, 8:24 PM

## 2018-11-17 NOTE — Consult Note (Signed)
Consultation Note Date: 11/17/2018   Patient Name: Marvin Lewis  DOB: November 23, 1951  MRN: [id number]  Age / Sex: 67 y.o., male  PCP: Arnetha Courser, MD Referring Physician: Fritzi Mandes, MD  Reason for Consultation: Establishing goals of care  HPI/Patient Profile: 67 y.o. male admitted on 11/24/2018 from home with complaints of shortness of breath. He has a past medical history significant for chronic respiratory failure, COPD, home oxygen use 3L, ongoing tobacco and alcohol abuse, atrial fibrillation, CVA, sleep apnea, hepatitis C, GERD, diabetes, depression, cirrhosis, ascending aortic aneurysm, and CHF. Patient's wife provided history to admitting provider due to patient being intubated for respiratory failure. Wife reported patient was restless, diaphoretic, short of breath with tremors prior to arrival. During his ED course he was found to be hypoxic with saturations in the 70s. Wife also expressed that patient was recently seen and started on three rounds of antibiotics for aspiration/pneumonitis, which patient completed. Glucose 179, AST 71, ALT 67, ABG showed pCO2 67, pO2 225, with positive Allens test. Patient was intubated and sedated on levophed and propofol. He was started on Zosyn post cultures. Since admission he continues to be sedated and intubated. Goal of weaning off IV pressers and sedation for SBT. He continues on IV Zosyn and Solu-Medrol. Palliative Medicine team consulted for goals of care.   Clinical Assessment and Goals of Care: I have reviewed medical records including lab results, imaging, Epic notes, and MAR, received report from the bedside RN, and assessed the patient. I then met at the bedside with patient's wife, Marvin Lewis to discuss diagnosis prognosis, [place], EOL wishes, disposition and options. Patient remains intubated and on minimum sedation as staff attempting to wean.  Patient continues to show no signs of response. He is unable to engage in goals of care discussion.   I introduced Palliative Medicine as specialized medical care for people living with serious illness. It focuses on providing relief from the symptoms and stress of a serious illness. The goal is to improve quality of life for both the patient and the family.  We discussed a brief life review of the patient. 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 They have been married for 40 plus years with 2 sons. One son lives locally in [place], while the other lives several hours away. They have 4 grandchildren. He enjoys watching sports and spending time with his family.  As far as functional and nutritional status wife reports patient has struggled with health issues for the past 15 years. She reports patient has been intubated several times with trach and PEG which were reversed. She reports he is generally ambulatory around the home with occasional assistance. She assist with shoes and socks when dressing otherwise he was able to perform most ADLs independently. Wife reports he is on home oxygen 2-3L. He has a fair appetite, however he has a known hilar mass which she states has been there for more than 10 years. It causes some narrowing of his esophagus placing him at risk for  aspiration. She reports patient is on and off of antibiotics due to pneumonia.   Wife reports patient continues to struggle with alcohol abuse as well. She states he generally drinks several beer a day along with 1-2 pints of Vodka a week. She reports he has most recently continued to state he was going to quit, however he was worried about detox and his current respiratory state. She reports prior to admission he was trying to wean himself and feels he may have been starting to experience detox signs which worsened his respiratory status.   We discussed his current illness and what it means in the larger context of his  on-going  co-morbidities.  Natural disease trajectory and expectations at EOL were discussed. We discussed specifically his current respiratory state, hilar mass, ongoing alcohol use, and overall functional state. Wife verbalized understanding and continued to express that she was not concerned in regards to the hilar mass as it had been there years and his current providers were not concerned. She expressed she felt after this serious illness he would stop drinking because that was his goal over the next few weeks also.    I attempted to discuss what would be the goal of care if patient was unable to wean from vent and/or continued to show a decrease response. Wife was tearful and stated she would then have to let him go because he would not want to be live in a vegetative state or to be placed in any type of facility. Support was given.   I attempted to elicit values and goals of care important to the patient.    The difference between aggressive medical intervention and comfort care was considered in light of the patient's goals of care. Mrs. Sonn expressed she was not prepared to shift care to comfort and remained hopeful for improvement and that he would be able to successfully wean from the vent and regain some strength.   I attempted to discuss patient's current full code status. I discussed with wife what a potential code, with consideration of patient's current illness and co-morbidities, would look like. Wife verbalized understanding and stated, "He would dead right now if I didn't let them do everything they could do including placing him on life-support. I have to give him a chance even if he is mad at me!" Patient does have a filed advanced directive which indicates he would not want life-sustaining measures in the event of a chronic/terminal illness and also that his wishes are to be followed and POA did not have permission to change his wishes. I discussed with wife who stated patient does not want  to remain intubated for a long period and would not want a tracheostomy or PEG again. She expressed he was miserable during that time but dealt with it. She expressed patient was eating 2 days post trach and did not need the PEG although he was under the impression it was required.   Wife reports she is familiar with both hospice and palliative services. She request to continue with watchful waiting to see if he is able to wean. At that time she expressed she would be more prepared to consider her options of hospice versus palliative. Support given.   Questions and concerns were addressed. The family was encouraged to call with questions or concerns.  PMT will continue to support holistically.   Primary Decision Maker:  HCPOA/WIFE: Marvin Lewis (AS LISTED)    SUMMARY OF RECOMMENDATIONS    FULL CODE-as requested  by wife   Continue to treat the treatable.   Wife is hopeful patient will show signs of improvement and be able to wean from vent. She remains hopeful but is also preparing for the worst.   Wife is not prepared to address outpatient needs (palliative versus hospice). She expresses her current mind is focused on the now and how he is doing while hospitalized. Requesting for palliative to remain engage. Expresses willingness to have outpatient at minimum if patient is discharged home.   Expresses wishes for no PEG, Trach, or SNF/LTAC.   Palliative Medicine team will continue to support patient, patient's family, and medical team during hospitalization.   Code Status/Advance Care Planning:  DNR  Palliative Prophylaxis:   Aspiration, Bowel Regimen, Delirium Protocol, Frequent Pain Assessment and Oral Care  Additional Recommendations (Limitations, Scope, Preferences):  Full Scope Treatment  Psycho-social/Spiritual:   Desire for further Chaplaincy support:NO   Additional Recommendations: Caregiving  Support/Resources and Education on Hospice  Prognosis:   Guarded to  Poor in the setting of acute on chronic respiratory failure, intubation, COPD, home oxygen use 3L, ongoing tobacco and alcohol abuse, atrial fibrillation, CVA, sleep apnea, hepatitis C, GERD, diabetes, depression, cirrhosis, ascending aortic aneurysm, CHF, deconditioning, history of tracheostomy and PEG.   Discharge Planning: To Be Determined      Primary Diagnoses: Present on Admission: . Respiratory failure, acute-on-chronic ([place])   I have reviewed the medical record, interviewed the patient and family, and examined the patient. The following aspects are pertinent.  Past Medical History:  Diagnosis Date  . A-fib ([place])   . Alcohol abuse   . Alcohol use   . Aortic aneurysm ([place]) 06/19/2017   Ascending 4.4 cm, CT scan July 2018  . Aortic aneurysm, intrathoracic ([place])   . Ascending aortic aneurysm ([place])   . Ascending aortic aneurysm ([place])   . Atrial fibrillation ([place]) 05/08/2016  . Broken rib April 2016   History of broken ribs x 3  . Bronchitis   . CAD (coronary artery disease)   . Cataract 02/06/2018   left  . CHF (congestive heart failure) ([place])   . Chronic alcoholism ([place])   . Cirrhosis ([place])   . Closed nondisplaced fracture of fifth metatarsal bone of left foot Nov. 2016   left  . Complication of anesthesia    HAS REQUIRED MULTIPLE REINTUBATIONS AFTER GEN ANESTHESIA. NO PROBLEM WITH MAC/ DID WELL WITH PREVIOUS CATARACT  . COPD (chronic obstructive pulmonary disease) ([place])   . Depression   . Diabetes mellitus without complication ([place])   . Disc disorder of cervical region    COMPRESSED CERVIVAL VERTEBRAE/ TRAUMATIC INJURY/ DIFFICULTY SWALLOWING  . Dysrhythmia   . Emphysema of lung ([place])   . Esophageal rupture   . Femur fracture ([facility])    tight  . GERD (gastroesophageal reflux disease)   . Hepatitis C    HEP B IN 1970'S  . Hip arthritis   . History of diverticulitis    ruptured and sepsis  . History of hiatal hernia   . History of smoking   . HOH (hard of hearing)    . Hyperlipidemia   . Hypertension   . Hypoxemia   . Insomnia   . Movement disorder   . Onychogryphosis   . Oxygen deficiency    3L/HS  . Pneumonia   . Rib fracture   . Seasonal allergies   . Shortness of breath dyspnea   . Sleep apnea    uses O2 _0  L/min at night  .  Stroke ([place])    X 4  . Thrombocytopenia ([place])    Social History   Socioeconomic History  . Marital status: Married    Spouse name: Not on file  . Number of children: Not on file  . Years of education: Not on file  . Highest education level: Not on file  Occupational History  . Occupation: retired  Scientific laboratory technician  . Financial resource strain: Not on file  . Food insecurity:    Worry: Not on file    Inability: Not on file  . Transportation needs:    Medical: Not on file    Non-medical: Not on file  Tobacco Use  . Smoking status: Former Smoker    Packs/day: 0.25    Years: 40.00    Pack years: 10.00    Types: Cigarettes  . Smokeless tobacco: Never Used  Substance and Sexual Activity  . Alcohol use: Yes    Comment: pint of vodka a week  . Drug use: No  . Sexual activity: Yes  Lifestyle  . Physical activity:    Days per week: Not on file    Minutes per session: Not on file  . Stress: Not on file  Relationships  . Social connections:    Talks on phone: Not on file    Gets together: Not on file    Attends religious service: Not on file    Active member of club or organization: Not on file    Attends meetings of clubs or organizations: Not on file    Relationship status: Not on file  Other Topics Concern  . Not on file  Social History Narrative  . Not on file   Family History  Problem Relation Age of Onset  . Coronary artery disease Mother   . Hypertension Mother   . Heart disease Mother   . Lung disease Mother   . Coronary artery disease Father   . Hypertension Father   . Diabetes Father   . Heart disease Father   . Cancer Father        prostate  . Alcohol abuse Brother    Scheduled  Meds: . chlorhexidine gluconate (MEDLINE KIT)  15 mL Mouth Rinse BID  . clonazePAM  1 mg Per Tube BID  . feeding supplement (PRO-STAT SUGAR FREE 64)  60 mL Per Tube QID  . feeding supplement (VITAL HIGH PROTEIN)  1,000 mL Per Tube Q24H  . folic acid  1 mg Per Tube Daily  . heparin  5,000 Units Subcutaneous Q8H  . insulin aspart  0-15 Units Subcutaneous Q4H  . mouth rinse  15 mL Mouth Rinse 10 times per day  . methylPREDNISolone (SOLU-MEDROL) injection  60 mg Intravenous Q24H  . multivitamin  15 mL Per Tube Daily  . pantoprazole (PROTONIX) IV  40 mg Intravenous Q24H  . sotalol  80 mg Oral BID  . thiamine  100 mg Per Tube Daily   Continuous Infusions: . sodium chloride 100 mL/hr at 11/17/18 1008  . dexmedetomidine (PRECEDEX) IV infusion 0.4 mcg/kg/hr (11/17/18 1018)  . fentaNYL infusion INTRAVENOUS 100 mcg/hr (11/17/18 1024)  . norepinephrine (LEVOPHED) Adult infusion 5 mcg/min (11/27/2018 1000)  . piperacillin-tazobactam (ZOSYN)  IV 3.375 g (11/17/18 0547)  . propofol (DIPRIVAN) infusion 10 mcg/kg/min (11/17/18 1008)   PRN Meds:.acetaminophen **OR** acetaminophen, ipratropium-albuterol, ondansetron **OR** ondansetron (ZOFRAN) IV Medications Prior to Admission:  Prior to Admission medications   Medication Sig Start Date End Date Taking? Authorizing Provider  amLODipine (NORVASC) 5 MG tablet Take  5 mg by mouth daily. 06/13/17  Yes [provider]  atorvastatin (LIPITOR) 10 MG tablet Take 1 tablet (10 mg total) by mouth at bedtime. 11/09/16  Yes Lada, Satira Anis, MD  benazepril (LOTENSIN) 10 MG tablet Take 10 mg by mouth daily.    Yes [provider]  furosemide (LASIX) 20 MG tablet Take 2 tablets (40 mg total) by mouth daily. 09/05/17  Yes Gouru, Illene Silver, MD  insulin aspart (NOVOLOG) 100 UNIT/ML injection Inject 12-20 Units into the skin 3 (three) times daily before meals. 12 at breakfast and lunch, 20 at super *Per sliding scale   Yes [provider]  insulin degludec  (TRESIBA) 100 UNIT/ML SOPN FlexTouch Pen Inject 50 Units into the skin at bedtime.    Yes [provider]  ipratropium-albuterol (DUONEB) 0.5-2.5 (3) MG/3ML SOLN Take 3 mLs by nebulization every 6 (six) hours as needed. For shortness of breath/wheezing   Yes [provider]  omeprazole (PRILOSEC) 20 MG capsule Take 20 mg by mouth daily.   Yes [provider]  sotalol (BETAPACE) 80 MG tablet Take 1 tablet (80 mg total) by mouth 2 (two) times daily. 05/22/16  Yes Teodoro Spray, MD  B-D INS SYRINGE 0.5CC/30GX1/2" 30G X 1/2" 0.5 ML MISC AS DIRECTED. 07/17/15   Guadalupe Maple, MD  fluticasone-salmeterol (ADVAIR HFA) 115-21 MCG/ACT inhaler Inhale 2 puffs into the lungs every 12 (twelve) hours as needed.     [provider]  zaleplon (SONATA) 10 MG capsule Take 1 capsule by mouth daily. 10/21/18   [provider]  zolpidem (AMBIEN) 10 MG tablet Take 10 mg by mouth at bedtime as needed for sleep.     [provider]   No Known Allergies Review of Systems  Unable to perform ROS: Intubated    Physical Exam  Constitutional: Vital signs are normal. He has a sickly appearance. He is intubated.  Cardiovascular: Normal rate, regular rhythm and normal heart sounds. Exam reveals decreased pulses.  Pulmonary/Chest: He is intubated. He has decreased breath sounds.  Abdominal: Normal appearance and bowel sounds are normal.  Neurological: He is unresponsive.  Sedated on vent.   Skin: Skin is warm and dry. No bruising noted.  Psychiatric: Cognition and memory are impaired. He expresses inappropriate judgment.    Vital Signs: BP 135/69   Pulse 63   Temp 98.2 F (36.8 C) (Axillary)   Resp 20   Ht _0  (1.778 m)   Wt 117.5 kg   SpO2 95%   BMI 37.17 kg/m  Pain Scale: CPOT POSS *See Group Information*: S-Acceptable,Sleep, easy to arouse     SpO2: SpO2: 95 % O2 Device:SpO2: 95 % O2 Flow Rate: .O2 Flow Rate (L/min): 3 L/min(from home at all  times)  IO: Intake/output summary:   Intake/Output Summary (Last 24 hours) at 11/17/2018 1038 Last data filed at 11/17/2018 1008 Gross per 24 hour  Intake 3895.62 ml  Output 2075 ml  Net 1820.62 ml    LBM: Last BM Date: (UTA) Baseline Weight: Weight: 106.6 kg Most recent weight: Weight: 117.5 kg     Palliative Assessment/Data: INTUBATED/SEDATED    Flowsheet Rows     Most Recent Value  Intake Tab  Referral Department  Hospitalist  Unit at Time of Referral  ER  Palliative Care Primary Diagnosis  Pulmonary  Date Notified  12/08/2018  Palliative Care Type  Return patient Palliative Care  Reason for referral  Clarify Goals of Care  Date of Admission  12/06/2018  #  of days IP prior to Palliative referral  0  Clinical Assessment  Psychosocial & Spiritual Assessment  Palliative Care Outcomes     Time In: 2641 Time Out: 1000 Time Total: 48 MIN.  Greater than 50%  of this time was spent counseling and coordinating care related to the above assessment and plan.  Signed by:  [name], AGPCNP-BC Palliative Medicine Team  Phone: [phone] Fax: [phone] Pager: [phone] Amion: Bjorn Pippin    Please contact Palliative Medicine Team phone at [phone] for questions and concerns.  For individual provider: See Shea Evans

## 2018-11-17 NOTE — Progress Notes (Signed)
Called [facility] to camera in on patient. RT noticed that patient was sounding wet and increased vent to 100%, suggested CXR and Lasix.

## 2018-11-18 LAB — BASIC METABOLIC PANEL
Anion gap: 8 (ref 5–15)
BUN: 42 mg/dL — AB (ref 8–23)
CO2: 23 mmol/L (ref 22–32)
Calcium: 8.2 mg/dL — ABNORMAL LOW (ref 8.9–10.3)
Chloride: 106 mmol/L (ref 98–111)
Creatinine, Ser: 1.21 mg/dL (ref 0.61–1.24)
GFR calc Af Amer: >60 mL/min (ref >60)
GFR calc non Af Amer: >60 mL/min (ref >60)
Glucose, Bld: 267 mg/dL — ABNORMAL HIGH (ref 70–99)
Potassium: 4 mmol/L (ref 3.5–5.1)
Sodium: 137 mmol/L (ref 135–145)

## 2018-11-18 LAB — CBC WITH DIFFERENTIAL/PLATELET
Abs Immature Granulocytes: 0.03 10*3/uL (ref 0.00–0.07)
Basophils Absolute: 0 10*3/uL (ref 0.0–0.1)
Basophils Relative: 0 %
Eosinophils Absolute: 0.1 10*3/uL (ref 0.0–0.5)
Eosinophils Relative: 2 %
HEMATOCRIT: 38.3 % — AB (ref 39.0–52.0)
Hemoglobin: 11.8 g/dL — ABNORMAL LOW (ref 13.0–17.0)
Immature Granulocytes: 0 %
Lymphocytes Relative: 5 %
Lymphs Abs: 0.3 10*3/uL — ABNORMAL LOW (ref 0.7–4.0)
MCH: 29.6 pg (ref 26.0–34.0)
MCHC: 30.8 g/dL (ref 30.0–36.0)
MCV: 96.2 fL (ref 80.0–100.0)
Monocytes Absolute: 0.6 10*3/uL (ref 0.1–1.0)
Monocytes Relative: 9 %
Neutro Abs: 5.8 10*3/uL (ref 1.7–7.7)
Neutrophils Relative %: 84 %
Platelets: 99 10*3/uL — ABNORMAL LOW (ref 150–400)
RBC: 3.98 MIL/uL — ABNORMAL LOW (ref 4.22–5.81)
RDW: 12.6 % (ref 11.5–15.5)
WBC: 6.8 10*3/uL (ref 4.0–10.5)
nRBC: 0 % (ref 0.0–0.2)

## 2018-11-18 LAB — BLOOD GAS, ARTERIAL
ACID-BASE DEFICIT: 0.8 mmol/L (ref 0.0–2.0)
Bicarbonate: 25.4 mmol/L (ref 20.0–28.0)
FIO2: 0.6
MECHVT: 500 mL
Mechanical Rate: 20
O2 Saturation: 98.6 %
PEEP: 7 cmH2O
Patient temperature: 37
RATE: 20 resp/min
pCO2 arterial: 47 mmHg (ref 32.0–48.0)
pH, Arterial: 7.34 — ABNORMAL LOW (ref 7.350–7.450)
pO2, Arterial: 123 mmHg — ABNORMAL HIGH (ref 83.0–108.0)

## 2018-11-18 LAB — BRAIN NATRIURETIC PEPTIDE: B Natriuretic Peptide: 1209 pg/mL — ABNORMAL HIGH (ref 0.0–100.0)

## 2018-11-18 LAB — GLUCOSE, CAPILLARY
Glucose-Capillary: 306 mg/dL — ABNORMAL HIGH (ref 70–99)
Glucose-Capillary: 256 mg/dL — ABNORMAL HIGH (ref 70–99)
Glucose-Capillary: 200 mg/dL — ABNORMAL HIGH (ref 70–99)
Glucose-Capillary: 125 mg/dL — ABNORMAL HIGH (ref 70–99)
Glucose-Capillary: 161 mg/dL — ABNORMAL HIGH (ref 70–99)
Glucose-Capillary: 151 mg/dL — ABNORMAL HIGH (ref 70–99)

## 2018-11-18 MED ORDER — LORAZEPAM 2 MG/ML IJ SOLN
1.0000 mg | INTRAMUSCULAR | Status: DC | PRN
  Administered 2018-11-18 – 2018-11-19 (×4): 2 mg via INTRAVENOUS
  Filled 2018-11-18 (×4): qty 1

## 2018-11-18 MED ORDER — LABETALOL HCL 5 MG/ML IV SOLN
10.0000 mg | Freq: Once | INTRAVENOUS | Status: AC
  Administered 2018-11-18: 10 mg via INTRAVENOUS
  Filled 2018-11-18: qty 4

## 2018-11-18 MED ORDER — DEXMEDETOMIDINE HCL IN NACL 400 MCG/100ML IV SOLN
0.2000 ug/kg/h | INTRAVENOUS | Status: DC
  Administered 2018-11-19: 0.2 ug/kg/h via INTRAVENOUS
  Administered 2018-11-19 (×2): 1.2 ug/kg/h via INTRAVENOUS
  Filled 2018-11-18 (×3): qty 100

## 2018-11-18 MED ORDER — FOLIC ACID 1 MG PO TABS: 1.0000 mg | ORAL_TABLET | Freq: Every day | ORAL | Status: DC

## 2018-11-18 MED ORDER — CHLORHEXIDINE GLUCONATE 0.12 % MT SOLN: 15.0000 mL | Freq: Two times a day (BID) | OROMUCOSAL | Status: DC

## 2018-11-18 MED ORDER — FUROSEMIDE 10 MG/ML IJ SOLN
40.0000 mg | Freq: Once | INTRAMUSCULAR | Status: AC
  Administered 2018-11-18: 40 mg via INTRAVENOUS
  Filled 2018-11-18: qty 4

## 2018-11-18 MED ORDER — ADULT MULTIVITAMIN W/MINERALS CH: 1.0000 | ORAL_TABLET | Freq: Every day | ORAL | Status: DC

## 2018-11-18 MED ORDER — ORAL CARE MOUTH RINSE
15.0000 mL | Freq: Two times a day (BID) | OROMUCOSAL | Status: DC
  Administered 2018-11-19: 15 mL via OROMUCOSAL

## 2018-11-18 MED ORDER — INSULIN GLARGINE 100 UNIT/ML ~~LOC~~ SOLN
23.0000 [IU] | Freq: Every day | SUBCUTANEOUS | Status: DC
  Administered 2018-11-18: 23 [IU] via SUBCUTANEOUS
  Filled 2018-11-18 (×2): qty 0.23

## 2018-11-18 MED ORDER — VITAMIN B-1 100 MG PO TABS: 100.0000 mg | ORAL_TABLET | Freq: Every day | ORAL | Status: DC

## 2018-11-18 NOTE — Progress Notes (Signed)
Patient was extubated to high flow nasal cannula per Dr. Patsey Berthold order. No stridor present. Patient oxygen saturation on HHFNC 40L and 60% FiO2 is 93%.

## 2018-11-18 NOTE — Progress Notes (Addendum)
Inpatient Diabetes Program Recommendations  AACE/ADA: New Consensus Statement on Inpatient Glycemic Control (2019)  Target Ranges:  Prepandial:   less than 140 mg/dL      Peak postprandial:   less than 180 mg/dL (1-2 hours)      Critically ill patients:  140 - 180 mg/dL   Results for LAMARKUS, NEBEL" (MRN [id number]) as of 11/18/2018 11:35  Ref. Range 11/17/2018 07:42 11/17/2018 11:45 11/17/2018 15:20 11/17/2018 19:41 11/17/2018 23:31 11/18/2018 04:26 11/18/2018 07:40  Glucose-Capillary Latest Ref Range: 70 - 99 mg/dL 262 (H) 190 (H) 187 (H) 229 (H) 317 (H) 306 (H) 256 (H)   Review of Glycemic Control  Home DM Meds: Tresiba (insulin degludec) 50 units QHS, Novolog 12 units Breakfast/ 12 units Lunch/ 20 units Dinner  Current Orders: Novolog 0-15 units Q4H, Lantus 15 units QHS; Solumedrol 60 mg Q24H, Vital @ 20 ml/hr  Inpatient Diabetes Program Recommendations:   Insulin - Basal: Please consider increasing Lantus to 23 units QHS (based on 113 kg x 0.2 units).  Insulin - Tube Feeding Coverage: Please consider ordering Novolog 3 units Q4H for tube feeding coverage. If tube feeding is stopped to held, then Novolog tube feeding coverage should also be stopped or held.  Thanks, Barnie Alderman, RN, MSN, CDE Diabetes Coordinator Inpatient Diabetes Program [phone] (Team Pager from 8am to 5pm)

## 2018-11-18 NOTE — Progress Notes (Signed)
[facility] NAME: Marvin Lewis    MR#:  [id number]  DATE OF BIRTH:  10/18/1951  SUBJECTIVE:   Pt extubated. On HFNC. Wife in the room. Pt more awake REVIEW OF SYSTEMS:   Review of Systems  Constitutional: Negative for chills, fever and weight loss.  HENT: Negative for ear discharge, ear pain and nosebleeds.   Eyes: Negative for blurred vision, pain and discharge.  Respiratory: Positive for shortness of breath. Negative for sputum production, wheezing and stridor.   Cardiovascular: Negative for chest pain, palpitations, orthopnea and PND.  Gastrointestinal: Negative for abdominal pain, diarrhea, nausea and vomiting.  Genitourinary: Negative for frequency and urgency.  Musculoskeletal: Negative for back pain and joint pain.  Neurological: Negative for sensory change, speech change, focal weakness and weakness.  Psychiatric/Behavioral: Negative for depression and hallucinations. The patient is not nervous/anxious.    Tolerating Diet pending speech eval Tolerating PT: pending  DRUG ALLERGIES:  No Known Allergies  VITALS:  Blood pressure (!) 187/83, pulse 79, temperature 97.8 F (36.6 C), temperature source Oral, resp. rate (!) 30, height 5\' 10"  (1.778 m), weight 113.5 kg, SpO2 94 %.  PHYSICAL EXAMINATION:   Physical Exam  GENERAL:  67 y.o.-year-old patient lying in the bed with no acute distress. Critically ill EYES: Pupils equal, round, reactive to light and accommodation. No scleral icterus.  HEENT: Head atraumatic, normocephalic. Oropharynx and nasopharynx clear.  NECK:  Supple, no jugular venous distention. No thyroid enlargement, no tenderness.  LUNGS: Normal breath sounds bilaterally, no wheezing, rales, rhonchi. No use of accessory muscles of respiration.  CARDIOVASCULAR: S1, S2 normal. No murmurs, rubs, or gallops.  ABDOMEN: Soft, nontender, nondistended. Bowel sounds present. No organomegaly or mass.   EXTREMITIES: No cyanosis, clubbing or edema b/l.    NEUROLOGIC: moves all extremities well PSYCHIATRIC:  patient is alert and awake SKIN: No obvious rash, lesion, or ulcer.   LABORATORY PANEL:  CBC Recent Labs  Lab 11/18/18 0715  WBC 6.8  HGB 11.8*  HCT 38.3*  PLT 99*    Chemistries  Recent Labs  Lab 12/07/2018 0618  11/17/18 0533 11/18/18 0715  NA 140   < >  --  137  K 4.4   < >  --  4.0  CL 101   < >  --  106  CO2 27   < >  --  23  GLUCOSE 179*   < >  --  267*  BUN 22   < >  --  42*  CREATININE 0.84   < >  --  1.21  CALCIUM 8.7*   < >  --  8.2*  MG  --    < > 1.9  --   AST 71*  --   --   --   ALT 67*  --   --   --   ALKPHOS 105  --   --   --   BILITOT 0.9  --   --   --    < > = values in this interval not displayed.   Cardiac Enzymes Recent Labs  Lab 11/24/2018 0618  TROPONINI <0.03   RADIOLOGY:  Dg Chest Port 1 View  Result Date: 11/17/2018 CLINICAL DATA:  Respiratory failure EXAM: PORTABLE CHEST 1 VIEW COMPARISON:  11/21/2018 FINDINGS: Endotracheal tube in good position. NG tube in place with tip not visualized. Progression of bibasilar airspace disease. Progressive vascular congestion with possible edema. IMPRESSION: Endotracheal tube in good  position. Progression of bilateral pleural effusions and bibasilar airspace disease. Progressive vascular congestion. Possible fluid overload versus pneumonia. Electronically Signed   By: Franchot Gallo M.D.   On: 11/17/2018 20:43   ASSESSMENT AND PLAN:  Marvin Lewis  is a 67 y.o. male with a known history of chronic respiratory failure secondary to COPD severe on home oxygen with ongoing tobacco abuse, chronic atrial fibrillation not on anticoagulation secondary to hemoptysis, aortic aneurysm 4.4 cm, chronic alcohol abuse with active drinking, hypertension comes to the emergency room after patient's wife found him being very restless this morning around 5 AM and short winded with diaphoresis and tremors. In the ER  patient was found to be hypoxic with sats durations in the 70s. Patient was intubated placed on the ventilator.  1. acute on chronic hypoxic respiratory failure secondary to COPD exacerbation/possible post obstructive pneumonia/aspiration/ acute on chronic systolicCHF -patient intubated on the ventilator -discussed with ICU attending Dr. Jefferson Fuel -IV Zosyn, IV Solu-Medrol, inhalers nebulizer -vent management per ICU attending--now extubated -speech to see -wean down oxygen to [place] -Elevated BNP. IV lasix--diuresing well  2. Hypotension -patient off IV pressers  3. Ongoing alcohol abuse -according to the wife patient drinks about 1 to 2 pints of vodka a week and significant amount of beer on a daily basis -watch for withdrawal -On IV precedex  4. Chronic atrial fibrillation -holding sotalol ?prolong QTC -patient not on anticoagulation due to history of hemoptysis -follows with Dr. Ubaldo Glassing  5. Diabetes type II -sliding scale insulin and Lantos  6. Known hilar/perihilar mass suspected carcinoid -follows with Dr. Raul Del--- limited options given poor lung condition (clinic notes)  7 elevated transaminases secondary to alcohol abuse  8. DVT prophylaxis subcu heparin  9. Known ascending aortic aneurysm 4.4 cm-- for wife this is followed by Dr. Ubaldo Glassing  Case discussed with Care Management/Social Worker. Management plans discussed with the patient, family and they are in agreement.  CODE STATUS: full  DVT Prophylaxis: heparin  TOTAL TIME TAKING CARE OF THIS PATIENT: *25* minutes.  >50% time spent on counselling and coordination of care  POSSIBLE D/C IN ??* DAYS, DEPENDING ON CLINICAL CONDITION.  Note: This dictation was prepared with Dragon dictation along with smaller phrase technology. Any transcriptional errors that result from this process are unintentional.  Fritzi Mandes M.D on 11/18/2018 at 4:32 PM  Between 7am to 6pm - Pager - [phone]  After 6pm go to  [email or web address] - password [email or web address] [facility]  Office  [phone]  CC: Primary care physician; Arnetha Courser, MDPatient ID: Marvin Lewis, male   DOB: [birth year], 67 y.o.   MRN: [id number]

## 2018-11-18 NOTE — Progress Notes (Signed)
Follow up - Critical Care Medicine Note  Marvin Lewis Details:    Marvin Lewis is an 67 y.o. male. Marvin Lewis is a 67 year old gentleman with a past medical history remarkable for severe COPD on home oxygen therapy being followed by Dr. Raul Del, a sending aortic aneurysm, atrial fibrillation, not on anticoagulation secondary to hemoptysis, coronary artery disease, cirrhosis, pretension, hyperlipidemia, CVA, chronic alcoholism has been treated for recurrent outpatient pneumonia by Dr. Raul Del.  Per wife Marvin Lewis has a narrowed right bronchus from a hilar mass.  Process has been considered for stenting in the past.  She states that he developed progressive increasing shortness of breath prompting coming to the emergency room where he was found to be hypoxemic and hypercapnic.  He was also felt to be in withdrawal.  He subsequently was intubated placed on mechanical ventilation.  He had transient hypotension most likely secondary to positive pressure and sedation and was started briefly on norepinephrine  Lines, Airways, Drains: Airway (Active)     Airway 6.5 mm (Active)  Secured at (cm) 24 cm 11/16/2018  8:33 PM  Measured From Lips 11/16/2018  8:33 PM  [place] 11/16/2018  8:33 PM  Secured By [employer] 11/16/2018  8:33 PM  Tube Holder Repositioned Yes 11/16/2018  8:33 PM  Cuff Pressure (cm H2O) 28 cm H2O 11/16/2018  8:33 PM  Site Condition Dry 11/16/2018  8:33 PM     Urethral Catheter april, rn 16 Fr. (Active)  Indication for Insertion or Continuance of Catheter Unstable critical patients (first 24-48 hours) 11/16/2018  4:00 PM  Site Assessment Clean;Intact 11/16/2018  4:00 PM  Catheter Maintenance Bag below level of bladder;Catheter secured;Drainage bag/tubing not touching floor;Insertion date on drainage bag;No dependent loops;Seal intact 11/16/2018  8:00 PM  Collection Container Standard drainage bag 11/16/2018  4:00 PM  Securement Method Securing device (Describe)  11/16/2018  4:00 PM  Urinary Catheter Interventions Unclamped 11/16/2018  4:00 PM  Output (mL) 225 mL 11/16/2018  6:18 PM    Anti-infectives:  Anti-infectives (From admission, onward)   Start     Dose/Rate Route Frequency Ordered Stop   11/18/2018 1400  piperacillin-tazobactam (ZOSYN) IVPB 3.375 g     3.375 g 12.5 mL/hr over 240 Minutes Intravenous Every 8 hours 12/02/2018 0912     11/16/2018 0630  piperacillin-tazobactam (ZOSYN) IVPB 3.375 g     3.375 g 100 mL/hr over 30 Minutes Intravenous  Once 11/23/2018 2409 12/04/2018 0854      Microbiology: Results for orders placed or performed during the hospital encounter of 11/13/2018  MRSA PCR Screening     Status: None   Collection Time: 11/28/2018 10:29 AM  Result Value Ref Range Status   MRSA by PCR NEGATIVE NEGATIVE Final    Comment:        The GeneXpert MRSA Assay (FDA approved for NASAL specimens only), is one component of a comprehensive MRSA colonization surveillance program. It is not intended to diagnose MRSA infection nor to guide or monitor treatment for MRSA infections. Performed at [facility], [address] [place], [place] [address]   Culture, blood (routine x 2)     Status: None (Preliminary result)   Collection Time: 12/01/2018  6:33 PM  Result Value Ref Range Status   Specimen Description BLOOD R ARM  Final   Special Requests   Final    BOTTLES DRAWN AEROBIC AND ANAEROBIC Blood Culture adequate volume   Culture   Final    NO GROWTH 3 DAYS Performed at  [facility], [address], [place], [place] [address]    Report Status PENDING  Incomplete  Culture, blood (routine x 2)     Status: None (Preliminary result)   Collection Time: 11/13/2018  7:16 PM  Result Value Ref Range Status   Specimen Description BLOOD RIGHT ANTECUBITAL  Final   Special Requests   Final    BOTTLES DRAWN AEROBIC AND ANAEROBIC Blood Culture adequate volume   Culture   Final    NO GROWTH 3 DAYS Performed at [facility], [address] [place], [place] [address]    Report Status PENDING  Incomplete    Best Practice/Protocols:  VTE Prophylaxis: Heparin (SQ) GI Prophylaxis: Proton Pump Inhibitor   Events:   Studies: Ct Chest Wo Contrast  Result Date: 10/29/2018 CLINICAL DATA:  Shortness of breath, right-sided chest pain and pneumonia. Marvin Lewis with known history of pulmonary carcinoid tumor. EXAM: CT CHEST WITHOUT CONTRAST TECHNIQUE: Multidetector CT imaging of the chest was performed following the standard protocol without IV contrast. COMPARISON:  Chest CT 06/15/2018 and 06/17/2017 FINDINGS: Cardiovascular: The heart is normal in size. No pericardial effusion. Stable fusiform aneurysmal dilatation of the ascending aorta which measures a maximum of 4.3 cm and is stable. Stable advanced three-vessel coronary artery calcifications. Mediastinum/Nodes: Stable mediastinal and hilar lymph nodes. Pretracheal node on image number 77 measures 10 mm. 10.5 mm subcarinal lymph node on image number 89. Lungs/Pleura: Stable large partially calcified right hilar/infrahilar mass which measures approximately 5.8 x 4.2 cm and is unchanged. This obstructs the right middle lobe bronchus which is chronically collapsed/atelectatic. There is also partial obstruction of the right lower lobe bronchus which appears to contain a calcification or broncholith. Several right lower lobe bronchi are slightly distended and filled with soft tissue or inflammatory debris. This could be mucoid impaction or endobronchial spread of tumor. Associated mild inflammatory changes in the right lower lobe but no focal airspace consolidation to suggest pneumonia. Stable emphysematous changes and pulmonary scarring. Stable area of right apical scarring. No worrisome pulmonary lesions elsewhere. No pleural effusion. Upper Abdomen: No significant upper abdominal findings. Could not exclude mild cirrhosis. Dense atherosclerotic calcifications involving the  abdominal aorta and branch vessel ostia, particularly the SMA. Musculoskeletal: No chest wall mass, supraclavicular or axillary adenopathy. No significant bony findings. Large bridging syndesmophytes are noted in the cervical spine suspicious for a inflammatory arthropathy. IMPRESSION: 1. Stable large right hilar/infrahilar calcified mass consistent with Marvin Lewis's known pulmonary carcinoid tumor. There is also persistent obstruction of the right middle lobe bronchus with right middle lobe collapse/atelectasis. There is also partial obstruction of the right lower lobe bronchus by a large calcification and there are dilated obstructed right lower lobe bronchi. This could be due to endobronchial spread of tumor or mucoid impaction. 2. No new pulmonary lesions. 3. Stable emphysematous changes and pulmonary scarring. 4. Stable fusiform aneurysmal dilatation of the ascending thoracic aorta with maximum measurement of 4.3 cm. 5. Stable mediastinal and hilar lymph nodes. 6. Stable advanced atherosclerotic calcifications involving the thoracic and abdominal aorta and branch vessels including dense three-vessel coronary artery calcifications. Aortic Atherosclerosis (ICD10-I70.0) and Emphysema (ICD10-J43.9). Electronically Signed   By: Marijo Sanes M.D.   On: 10/29/2018 16:13   Ct Chest W Contrast  Addendum Date: 11/13/2018   ADDENDUM REPORT: 11/20/2018 09:42 ADDENDUM: Not mentioned above is a nasogastric tube which terminates in the esophagus, just above the craniocaudal level of the carina. This was advanced on subsequent radiographs. Electronically Signed   By: Marylyn Ishihara  Jobe Igo M.D.   On: 11/18/2018 09:42   Result Date: 11/20/2018 CLINICAL DATA:  Shortness of breath. COPD. Cirrhosis. Chronic alcohol abuse. Known right middle lobe pneumonia. On antibiotics. EXAM: CT CHEST WITH CONTRAST TECHNIQUE: Multidetector CT imaging of the chest was performed during intravenous contrast administration. CONTRAST:  88mL OMNIPAQUE  IOHEXOL 300 MG/ML  SOLN COMPARISON:  11/28/2018 chest radiograph. Most recent CT 10/29/2018. FINDINGS: Cardiovascular: Degraded exam, secondary to mild motion and Marvin Lewis arm position (not raised above the head). Advanced aortic and branch vessel atherosclerosis. Tortuous thoracic aorta. Moderate cardiomegaly, without pericardial effusion. Multivessel coronary artery atherosclerosis. Pulmonary artery enlargement, outflow tract 3.6 cm No central pulmonary embolism, on this non-dedicated study. Mediastinum/Nodes: No supraclavicular adenopathy. Borderline enlarged precarinal node of 10 mm is unchanged. A subcarinal node of 12 mm is similar to 11 mm on the prior. Partially calcified right hilar mass again identified. On the order of 6.0 x 4.5 cm today versus 5.8 x 4.2 cm on the prior. Lungs/Pleura: New trace right pleural fluid. Appropriate position of endotracheal tube, well above the carina. Right lower lobe obstruction or compression by tumor is felt to be slightly increased, including on image 77/3. Presumed scarring at the right apex, with a similar somewhat more nodular component medially, including at 9 mm on image 36/3. Centrilobular and paraseptal emphysema is moderate. Patchy posterior right upper lobe peribronchovascular airspace opacities. Development of posterior left upper lobe and dense left lower lobe consolidation compared to the prior CT. Significantly worsened right lower lobe aeration, with consolidation throughout. Persistent posterior right middle lobe collapse and volume loss. Upper Abdomen: Mild cirrhosis. Cholecystectomy. Normal imaged portions of the stomach, spleen, left adrenal gland, kidneys. Mild right adrenal nodularity is unchanged. Abdominal aortic atherosclerosis. Prominent porta hepatis nodes are likely reactive. Musculoskeletal: Remote left rib trauma. Moderate thoracic spondylosis. IMPRESSION: 1. Degradation, including motion and Marvin Lewis arm position. 2. Development of multifocal  pulmonary opacities, including dense consolidation in both lung bases. Findings are consistent with multifocal pneumonia or aspiration. 3. Right hilar partially calcified mass, consistent with the clinical history of carcinoid. This is similar to slightly increased compared to 10/29/2018. Borderline mediastinal adenopathy is unchanged. Secondary partial collapse of the right middle lobe is relatively similar. 4. Aortic atherosclerosis (ICD10-I70.0), coronary artery atherosclerosis and emphysema (ICD10-J43.9). 5. Pulmonary artery enlargement suggests pulmonary arterial hypertension. 6. Cirrhosis. Electronically Signed: By: Abigail Miyamoto M.D. On: 11/13/2018 09:23   Dg Chest Port 1 View  Result Date: 11/17/2018 CLINICAL DATA:  Respiratory failure EXAM: PORTABLE CHEST 1 VIEW COMPARISON:  12/01/2018 FINDINGS: Endotracheal tube in good position. NG tube in place with tip not visualized. Progression of bibasilar airspace disease. Progressive vascular congestion with possible edema. IMPRESSION: Endotracheal tube in good position. Progression of bilateral pleural effusions and bibasilar airspace disease. Progressive vascular congestion. Possible fluid overload versus pneumonia. Electronically Signed   By: Franchot Gallo M.D.   On: 11/17/2018 20:43   Dg Chest Port 1 View  Result Date: 12/02/2018 CLINICAL DATA:  Evaluate endotracheal tube status. EXAM: PORTABLE CHEST 1 VIEW COMPARISON:  Earlier today at 0600 hours. FINDINGS: 640 hours. Endotracheal tube terminates 5.1 cm above carina.Mild cardiomegaly. Small right pleural effusion or pleural thickening, blunting the right costophrenic angle. Right base airspace disease. Hyperinflation. No congestive failure. No pneumothorax. IMPRESSION: Appropriate position of endotracheal tube, 5.1 cm above carina. Persistent right base airspace disease and pleural fluid or thickening. Electronically Signed   By: Abigail Miyamoto M.D.   On: 12/02/2018 07:15   Dg Chest Port 1  View  Result Date: 12/04/2018 CLINICAL DATA:  Respiratory distress. Carcinoid tumor. EXAM: PORTABLE CHEST 1 VIEW COMPARISON:  09/04/2018 radiography. Chest CT 10/29/2017 FINDINGS: Similar appearance to previous exams. Right hilar region mass with calcification better shown at CT. Chronic volume loss in the right lower lung with right pleural blunting. There could be coexistent pneumonia in this region. Some emphysema in the upper lungs. No acute finding on the left. IMPRESSION: Right hilar region mass with calcification better shown at CT. Chronic volume loss in the right lower lung with right pleural blunting. There could be coexistent pneumonia in this region. Electronically Signed   By: Nelson Chimes M.D.   On: 12/08/2018 06:53   Dg Abd Portable 1 View  Result Date: 11/11/2018 CLINICAL DATA:  Repositioning of nasogastric tube. EXAM: PORTABLE ABDOMEN - 1 VIEW COMPARISON:  Chest radiograph of earlier today. FINDINGS: Extensive support apparatus projecting over the upper abdomen, obscuring the distal portion of the nasogastric tube. The tube has been advanced, favored to terminate over the body of the stomach. No gross free intraperitoneal air. Bibasilar airspace disease has been detailed previously. IMPRESSION: Suboptimal exam, secondary to extensive overlying support apparatus. Nasogastric tube likely terminating at the body of the stomach. Electronically Signed   By: Abigail Miyamoto M.D.   On: 11/26/2018 09:39    Consults: Treatment Team:  Pccm, Ander Gaster, MD   Subjective:    Overnight Issues:  Has been switched to Precedex and fentanyl.  More awake this morning.  Following commands.  Awaiting SBT. Objective:  Vital signs for last 24 hours: Temp:  [97.7 F (36.5 C)-99.8 F (37.7 C)] 99.8 F (37.7 C) (12/11 2000) Pulse Rate:  [46-119] 119 (12/11 2000) Resp:  [14-30] 20 (12/11 2000) BP: (139-206)/(67-95) 162/86 (12/11 2000) SpO2:  [91 %-99 %] 92 % (12/11 2000) FiO2 (%):  [40 %-100 %] 65 %  (12/11 1959) Weight:  [113.5 kg] 113.5 kg (12/11 0451)  Hemodynamic parameters for last 24 hours:  Reviewed.  Not requiring pressors.  Intake/Output from previous day: 12/10 0701 - 12/11 0700 In: 4176.6 [I.V.:3443.9; NG/GT:480; IV Piggyback:252.7] Out: 4980 [Urine:4980]  Intake/Output this shift: No intake/output data recorded.  Vent settings for last 24 hours: Vent Mode: PRVC FiO2 (%):  [40 %-100 %] 65 % Set Rate:  [20 bmp] 20 bmp Vt Set:  [500 mL] 500 mL PEEP:  [7 cmH20] 7 cmH20 Plateau Pressure:  [17 IWL79-89 cmH20] 17 cmH20  Physical Exam:  General: Iintubated, mechanically ventilated.  Calm and following commands. HEENT:    Periorbital edema, and tongue edema, resolved.   Trachea midline, no thyromegaly appreciated, prior tracheostomy scar. Cardiovascular:  Irregularly irregular rhythm with controlled ventricular response Abdominal:      Positive bowel sounds, soft exam Extremities:     2+ pitting edema Neurologic:      Marvin Lewis is sedated on mechanical ventilation, limited exam, moves all 4 spontaneously, agitated when sedation decreased. Cutaneous:      Signs of chronic venous stasis   Assessment/Plan:    LOS: 3 days   Additional comments: ICU multidisciplinary rounds were performed.  Marvin Lewis's wife and son were apprised at bedside.   Assessment and Plan:  1.  Acute hypoxic/hypercapnic respiratory failure respiratory failure requiring intubation.  Marvin Lewis's PCO2 was 69 and pH was 7.21.  I reviewed chest x-ray and CT scan which reveals collapse of the basilar lung segments.  CT scan revealed calcified right infrahilar mass with some endoluminal narrowing posteriorly and medially past right mainstem bronchus and bronchus intermedius.  However it appears that this is a chronic process and the Marvin Lewis has had this issue for approximately 6 years. Unclear specific pathologic etiology possibilities do include carcinoid versus other slowly process.  Per the Marvin Lewis's wife he has  had issues with difficulties doing bronchoscopy to include bleeding.  He has had prior tracheostomy due to poor weaning from mechanical ventilation.  His wishes were not to be reintubated.  We will try to wean him off the ventilator as able.  At present, bronchoscopy will not be able to be performed with a #6.5 ET tube in place.  Given the calcifications in this mass the chances of being able to expand the bronchus with a stent will be difficult.  Continue antibiotics until cultures are known.  Continue bronchodilators, pulmonary toilet and IV steroids.  Rating SBT well today.  Will attempt extubation.  2. Chronic alcoholism, he is currently in withdrawal though symptoms are mild.  Continue thiamine, multivitamins, and CIWA protocol.   He apparently had tried to discontinue this when he became delirious and decompensated at home. Continue supportive care.  3.  Atrial fibrillation.  Controlled ventricular response.  He is on sotalol which had to be held due to significant bradycardia.  4.  Elevated BNP, he received Lasix.  Marvin Lewis does have a history of coronary artery disease, most recent echo revealed reduced ejection fraction at 35 to 40% with some mitral valvular disease  Echocardiogram, shows no significant change.  Repeated Lasix dose today.  5.  Periorbital and tongue edema, improved today.  Witholding ACE inhibitor's.  Critical Care Total Time*: 35 minutes  Vernard Gambles 11/18/2018  *Care during the described time interval was provided by me and/or other providers on the critical care team.  I have reviewed this Marvin Lewis's available data, including medical history, events of note, physical examination and test results as part of my evaluation.

## 2018-11-19 ENCOUNTER — Inpatient Hospital Stay: Payer: Medicare Other

## 2018-11-19 DIAGNOSIS — Z66 Do not resuscitate: Secondary | ICD-10-CM

## 2018-11-19 LAB — BASIC METABOLIC PANEL
Anion gap: 8 (ref 5–15)
BUN: 37 mg/dL — ABNORMAL HIGH (ref 8–23)
CO2: 28 mmol/L (ref 22–32)
CREATININE: 1.01 mg/dL (ref 0.61–1.24)
Calcium: 8.5 mg/dL — ABNORMAL LOW (ref 8.9–10.3)
Chloride: 109 mmol/L (ref 98–111)
GFR calc Af Amer: >60 mL/min (ref >60)
GFR calc non Af Amer: >60 mL/min (ref >60)
Glucose, Bld: 133 mg/dL — ABNORMAL HIGH (ref 70–99)
Potassium: 3.5 mmol/L (ref 3.5–5.1)
Sodium: 145 mmol/L (ref 135–145)

## 2018-11-19 LAB — CBC
HCT: 38.1 % — ABNORMAL LOW (ref 39.0–52.0)
Hemoglobin: 11.9 g/dL — ABNORMAL LOW (ref 13.0–17.0)
MCH: 29.5 pg (ref 26.0–34.0)
MCHC: 31.2 g/dL (ref 30.0–36.0)
MCV: 94.5 fL (ref 80.0–100.0)
Platelets: 109 10*3/uL — ABNORMAL LOW (ref 150–400)
RBC: 4.03 MIL/uL — ABNORMAL LOW (ref 4.22–5.81)
RDW: 12.9 % (ref 11.5–15.5)
WBC: 9.4 10*3/uL (ref 4.0–10.5)
nRBC: 0 % (ref 0.0–0.2)

## 2018-11-19 LAB — MAGNESIUM: Magnesium: 1.7 mg/dL (ref 1.7–2.4)

## 2018-11-19 LAB — PHOSPHORUS: Phosphorus: 3.3 mg/dL (ref 2.5–4.6)

## 2018-11-19 LAB — GLUCOSE, CAPILLARY
Glucose-Capillary: 154 mg/dL — ABNORMAL HIGH (ref 70–99)
Glucose-Capillary: 139 mg/dL — ABNORMAL HIGH (ref 70–99)
Glucose-Capillary: 130 mg/dL — ABNORMAL HIGH (ref 70–99)

## 2018-11-19 MED ORDER — INSULIN ASPART 100 UNIT/ML ~~LOC~~ SOLN
0.0000 [IU] | Freq: Four times a day (QID) | SUBCUTANEOUS | Status: DC
  Administered 2018-11-19: 2 [IU] via SUBCUTANEOUS
  Filled 2018-11-19: qty 1

## 2018-11-19 MED ORDER — METOPROLOL TARTRATE 5 MG/5ML IV SOLN: 5.0000 mg | Freq: Four times a day (QID) | INTRAVENOUS | Status: DC

## 2018-11-19 MED ORDER — FUROSEMIDE 10 MG/ML IJ SOLN
40.0000 mg | Freq: Once | INTRAMUSCULAR | Status: AC
  Administered 2018-11-19: 40 mg via INTRAVENOUS
  Filled 2018-11-19: qty 4

## 2018-11-19 MED ORDER — METOPROLOL TARTRATE 5 MG/5ML IV SOLN: 5.0000 mg | Freq: Four times a day (QID) | INTRAVENOUS | Status: DC | PRN

## 2018-11-19 MED ORDER — THIAMINE HCL 100 MG/ML IJ SOLN
100.0000 mg | Freq: Every day | INTRAMUSCULAR | Status: DC
  Administered 2018-11-19: 100 mg via INTRAVENOUS
  Filled 2018-11-19: qty 2

## 2018-11-20 LAB — CULTURE, BLOOD (ROUTINE X 2)
CULTURE: NO GROWTH
CULTURE: NO GROWTH
SPECIAL REQUESTS: ADEQUATE
Special Requests: ADEQUATE

## 2018-11-23 ENCOUNTER — Telehealth: Payer: Self-pay | Admitting: Pulmonary Disease

## 2018-11-23 NOTE — Telephone Encounter (Signed)
Death certificate received and placed in Dr. Patsey Berthold box for signing.

## 2018-11-24 NOTE — Telephone Encounter (Signed)
Death Certificate completed and placed at front desk for pick up. [facility] and [facility] aware.

## 2018-12-09 NOTE — Progress Notes (Signed)
[facility] [place] NAME: Marvin Lewis    MR#:  [id number]  DATE OF BIRTH:  06-Aug-1951  SUBJECTIVE:   Pt extubated. On HFNC.  He is Precedex infusion, confused, slightly tachycardic, appears to be agitated.Marland Kitchen REVIEW OF SYSTEMS:   Review of Systems  Constitutional: Negative for chills, fever and weight loss.  HENT: Negative for ear discharge, ear pain and nosebleeds.   Eyes: Negative for blurred vision, pain and discharge.  Respiratory: Positive for shortness of breath. Negative for sputum production, wheezing and stridor.   Cardiovascular: Negative for chest pain, palpitations, orthopnea and PND.  Gastrointestinal: Negative for abdominal pain, diarrhea, nausea and vomiting.  Genitourinary: Negative for frequency and urgency.  Musculoskeletal: Negative for back pain and joint pain.  Neurological: Negative for sensory change, speech change, focal weakness and weakness.  Psychiatric/Behavioral: Negative for depression and hallucinations. The patient is not nervous/anxious.    Tolerating Diet pending speech eval Tolerating PT: pending  DRUG ALLERGIES:  No Known Allergies  VITALS:  Blood pressure (!) 143/97, pulse 86, temperature 99 F (37.2 C), temperature source Oral, resp. rate 17, height 5\' 10"  (1.778 m), weight 117.3 kg, SpO2 99 %.  PHYSICAL EXAMINATION:   Physical Exam  GENERAL:  68 y.o.-year-old patient lying in the bed with no acute distress. Critically ill EYES: Pupils equal, round, reactive to light and accommodation. No scleral icterus.  HEENT: Head atraumatic, normocephalic. Oropharynx and nasopharynx clear.  NECK:  Supple, no jugular venous distention. No thyroid enlargement, no tenderness.  LUNGS: Normal breath sounds bilaterally, no wheezing, rales, rhonchi. No use of accessory muscles of respiration.  CARDIOVASCULAR: S1, S2 tachycardic no murmurs, rubs, or gallops.  ABDOMEN: Soft, nontender, nondistended.  Bowel sounds present. No organomegaly or mass.  EXTREMITIES: No cyanosis, clubbing or edema b/l.    NEUROLOGIC: moves all extremities well PSYCHIATRIC:  patient is alert and awake SKIN: No obvious rash, lesion, or ulcer.   LABORATORY PANEL:  CBC Recent Labs  Lab 19-Dec-2018 0549  WBC 9.4  HGB 11.9*  HCT 38.1*  PLT 109*    Chemistries  Recent Labs  Lab 11/10/2018 0618  2018-12-19 0549  NA 140   < > 145  K 4.4   < > 3.5  CL 101   < > 109  CO2 27   < > 28  GLUCOSE 179*   < > 133*  BUN 22   < > 37*  CREATININE 0.84   < > 1.01  CALCIUM 8.7*   < > 8.5*  MG  --    < > 1.7  AST 71*  --   --   ALT 67*  --   --   ALKPHOS 105  --   --   BILITOT 0.9  --   --    < > = values in this interval not displayed.   Cardiac Enzymes Recent Labs  Lab 11/27/2018 0618  TROPONINI <0.03   RADIOLOGY:  Dg Chest Port 1 View  Result Date: December 19, 2018 CLINICAL DATA:  Acute respiratory failure EXAM: PORTABLE CHEST 1 VIEW COMPARISON:  11/17/2018 FINDINGS: The endotracheal tube and nasogastric catheter have been removed in the interval. Cardiac shadow remains enlarged. Tortuous and mildly calcified aorta is again identified. Bilateral pleural effusions are noted right greater than left. Some right basilar atelectasis is noted as well. Mild vascular congestion remains. IMPRESSION: Right basilar atelectasis and bilateral pleural effusions. The overall appearance is stable with the exception of interval removal of  ET and NG catheters. Electronically Signed   By: Inez Catalina M.D.   On: 2018-11-23 07:46   Dg Chest Port 1 View  Result Date: 11/17/2018 CLINICAL DATA:  Respiratory failure EXAM: PORTABLE CHEST 1 VIEW COMPARISON:  11/22/2018 FINDINGS: Endotracheal tube in good position. NG tube in place with tip not visualized. Progression of bibasilar airspace disease. Progressive vascular congestion with possible edema. IMPRESSION: Endotracheal tube in good position. Progression of bilateral pleural effusions and  bibasilar airspace disease. Progressive vascular congestion. Possible fluid overload versus pneumonia. Electronically Signed   By: Franchot Gallo M.D.   On: 11/17/2018 20:43   ASSESSMENT AND PLAN:  [name]  is a 68 y.o. male with a known history of chronic respiratory failure secondary to COPD severe on home oxygen with ongoing tobacco abuse, chronic atrial fibrillation not on anticoagulation secondary to hemoptysis, aortic aneurysm 4.4 cm, chronic alcohol abuse with active drinking, hypertension comes to the emergency room after patient's wife found him being very restless this morning around 5 AM and short winded with diaphoresis and tremors. In the ER patient was found to be hypoxic with sats durations in the 70s. Patient was intubated placed on the ventilator.  1. acute on chronic hypoxic respiratory failure secondary to COPD exacerbation/possible post obstructive pneumonia/aspiration/ acute on chronic systolic CHF  Extubated, on high flow nasal cannula , tachycardic. -IV Zosyn, IV Solu-Medrol, inhalers nebulizer -vent management per ICU attending--now extubated On high flow nasal cannula, tachycardic, confused, started back on Precedex drip. -wean down oxygen to [place] -Elevated BNP. IV lasix--diuresing well  2. Hypotension -patient off IV pressors  3. Ongoing alcohol abuse -according to the wife patient drinks about 1 to 2 pints of vodka a week and significant amount of beer on a daily basis, drug with tachycardia, restarting Precedex drip. -4. Chronic atrial fibrillation -holding sotalol ?prolong QTC -patient not on anticoagulation due to history of hemoptysis -follows with Dr. Ubaldo Glassing  5. Diabetes type II -sliding scale insulin and Lantos  6. Known hilar/perihilar mass suspected carcinoid -follows with Dr. Raul Del--- limited options given poor lung condition (clinic notes)  7 elevated transaminases secondary to alcohol abuse  8. DVT prophylaxis subcu heparin  9.  Known ascending aortic aneurysm 4.4 cm-- for wife this is followed by Dr. Ubaldo Glassing  Case discussed with Care Management/Social Worker. Management plans discussed with the patient, family and they are in agreement.  CODE STATUS: full  DVT Prophylaxis: heparin  TOTAL TIME TAKING CARE OF THIS PATIENT: *25* minutes.  >50% time spent on counselling and coordination of care  POSSIBLE D/C IN ??* DAYS, DEPENDING ON CLINICAL CONDITION.  Note: This dictation was prepared with Dragon dictation along with smaller phrase technology. Any transcriptional errors that result from this process are unintentional.  Epifanio Lesches M.D on November 23, 2018 at 12:17 PM  Between 7am to 6pm - Pager - [phone]  After 6pm go to [email or web address] - password [email or web address] [facility]  Office  [phone]  CC: Primary care physician; Arnetha Courser, MDPatient ID: Kendrick Fries, male   DOB: 1950/12/27, 68 y.o.   MRN: [id number]

## 2018-12-09 NOTE — Progress Notes (Signed)
Daily Progress Note   Patient Name: Marvin Lewis       Date: 11/29/2018 DOB: June 17, 1951  Age: 68 y.o. MRN#: [id number] Attending Physician: Epifanio Lesches, MD Primary Care Physician: Arnetha Courser, MD Admit Date: 11/20/2018  Reason for Consultation/Follow-up: Establishing goals of care, Psychosocial/spiritual support and Withdrawal of life-sustaining treatment  Subjective: Patient remains critically ill. He is lethargic with occasional moans. Will not follow commands or open eyes. When attempting to provide suctioning due to cough patient bites on yankauer and shakes head back and forth aggressively. He was extubated yesterday and tolerated HFNC however has now began showing increasing signs of agitation and respiratory distress. Tachypnea (20-30s) and tachycardia (HR 120s). Wife requested respiratory support and patient has now been placed back on BiPAP.   Wife ([name]), sister, and patient's oldest son Loa Socks) with his wife is at the bedside. I spent a detail amount of time answering questions in addition to Dr. Patsey Berthold regarding patient's condition. I discussed with family signs of respiratory distress and patient's documented wishes of DNR/DNI. Family does confirm that patient would not want to be reintubated. Wife continues to report she was hopeful that he would get better and return home with palliative or hospice support and quit drinking. Support given. Son expressed seeing his father in similar situations in the past (at least 5 times) and seeing him also recover. I discussed his current illness with consideration to his co-morbidities (alcohol abuse, end-stage COPD, hilar mass, renal function, and cardiac function). Family tearful and verbalized understanding. Wife requesting  ABG to see what his O2/CO2 levels were to bring her peace of mind that patient was not just going through withdrawals. I discussed with her and family signs of withdrawals as well as trajectory of patients COPD, pneumonia, chronic systolic CHF, and acute on chronic hypoxic respiratory failure, in addition to patient recently being extubated and previous history of extubations (trach) placement. I discussed with wife, knowing that patient's documented and previously expressed wishes were not to have life-sustaining measures placing patient through painful labwork would not change the outcome of treatment. She and other family verbalized understanding after explanation.   Son tearful and states he is at peace and feels that "this time" is different and that he knows his father will most likely pass away. Support given.   I  discussed with family differences between aggressive and comfort care. I educated family, as they questioned the process of comfort once decision is made. We discussed removing BiPAP and allowing patient to be comfortable with use of 1-2L/nasal cannula and medications to control symptoms such as agitation, pain, shortness of breath, and secretions. I discussed with family that aggressive measures such as the BIPAP machine, continuous tele, vitals, labwork, radiology testing, and medications would be discontinued and the medical team would focus on how the patient is feeling and how he looks in relation to comfort. Family is aware that patient most likely would have a hospital death. Wife and patient's sister-in-law tearful. All family verbalized understanding and appreciation.   Family verbalized approval to continue to manage on Precedex for stability and rest due to previous episode of respiratory distress and agitation.   At this time family is requesting time to process and also wait for patient's other son to arrive later this afternoon to shift care to comfort. Support given.    Length of Stay: 4  Current Medications: Scheduled Meds:  . chlorhexidine gluconate (MEDLINE KIT)  15 mL Mouth Rinse BID  . clonazePAM  1 mg Per Tube BID  . folic acid  1 mg Per Tube Daily  . heparin  5,000 Units Subcutaneous Q8H  . insulin aspart  0-15 Units Subcutaneous Q6H  . mouth rinse  15 mL Mouth Rinse q12n4p  . metoprolol tartrate  5 mg Intravenous Q6H  . multivitamin  15 mL Per Tube Daily  . pantoprazole (PROTONIX) IV  40 mg Intravenous Q24H  . polyethylene glycol  17 g Per Tube Daily  . thiamine injection  100 mg Intravenous Daily    Continuous Infusions: . sodium chloride 100 mL/hr at 2018-12-15 1500  . dexmedetomidine (PRECEDEX) IV infusion 1.2 mcg/kg/hr (December 15, 2018 1500)  . fentaNYL infusion INTRAVENOUS Stopped (11/18/18 1333)  . piperacillin-tazobactam (ZOSYN)  IV 12.5 mL/hr at 12/15/2018 1500  . propofol (DIPRIVAN) infusion Stopped (11/18/18 0929)    PRN Meds: acetaminophen **OR** acetaminophen, hydrALAZINE, ipratropium-albuterol, LORazepam, ondansetron **OR** ondansetron (ZOFRAN) IV  Physical Exam Vitals signs and nursing note reviewed.  Constitutional:      Appearance: Normal appearance. He is sickly-appearing.     Comments: Frail, critically ill on BiPAP   Cardiovascular:     Rate and Rhythm: Tachycardia present. Rhythm irregular.     Pulses: Decreased pulses.     Heart sounds: Normal heart sounds.     Comments: Generalized edema  Pulmonary:     Effort: Accessory muscle usage present.     Breath sounds: Decreased breath sounds and rhonchi present.     Comments: BiPAP  Abdominal:     General: Bowel sounds are decreased.     Palpations: Abdomen is soft.  Skin:    General: Skin is warm and dry.     Findings: Bruising present.  Neurological:     Mental Status: He is lethargic.  Psychiatric:        Behavior: Behavior is uncooperative.        Cognition and Memory: Memory is impaired.        Judgment: Judgment is inappropriate.             Vital Signs:  BP (!) 140/92   Pulse 97   Temp 99 F (37.2 C) (Oral)   Resp (!) 21   Ht 5' 10" (1.778 m)   Wt 117.3 kg   SpO2 94%   BMI 37.11 kg/m  SpO2: SpO2: 94 % O2  Device: O2 Device: Bi-PAP O2 Flow Rate: O2 Flow Rate (L/min): 40 L/min  Intake/output summary:   Intake/Output Summary (Last 24 hours) at November 27, 2018 1649 Last data filed at 2018/11/27 1646 Gross per 24 hour  Intake 2574.74 ml  Output 9351 ml  Net -6776.26 ml   LBM: Last BM Date: 11-27-2018 Baseline Weight: Weight: 106.6 kg Most recent weight: Weight: 117.3 kg       Palliative Assessment/Data: PPS 10% on BIPAP     Flowsheet Rows     Most Recent Value  Intake Tab  Referral Department  Hospitalist  Unit at Time of Referral  ER  Palliative Care Primary Diagnosis  Pulmonary  Date Notified  11/20/2018  Palliative Care Type  Return patient Palliative Care  Reason for referral  Clarify Goals of Care  Date of Admission  11/24/2018  # of days IP prior to Palliative referral  0  Clinical Assessment  Psychosocial & Spiritual Assessment  Palliative Care Outcomes      Patient Active Problem List   Diagnosis Date Noted  . Respiratory failure, acute-on-chronic ([place]) 11/24/2018  . Closed right hip fracture ([place]) 09/16/2017  . Acute respiratory distress   . Palliative care by specialist   . Aortic aneurysm ([place]) 06/19/2017  . Hemoptysis 06/17/2017  . Leg cramps 05/08/2016  . Anemia 05/08/2016  . Atrial fibrillation ([place]) 05/08/2016  . Abnormal weight gain 04/22/2016  . Goals of care, counseling/discussion 04/22/2016  . Elevated brain natriuretic peptide (BNP) level 04/22/2016  . Persistent atrial fibrillation 03/29/2016  . Breathlessness on exertion 03/27/2016  . Gout 02/23/2016  . Hyperkalemia 01/12/2016  . Onychomycosis 01/10/2016  . Hoarseness of voice 01/10/2016  . Tobacco abuse 01/10/2016  . Hx of completed stroke 01/10/2016  . Athletes foot 08/09/2015  . Chronic alcoholism ([place])   . Movement disorder   . COPD  (chronic obstructive pulmonary disease) ([place])   . Emphysema of lung ([place])   . Type II diabetes mellitus, uncontrolled ([place])   . Hyperlipidemia   . Hypertension   . CAD (coronary artery disease)   . Hepatitis C   . Cirrhosis ([place])   . Alcohol abuse   . Onychogryphosis   . Insomnia   . Hip arthritis   . Hypoxemia   . 1st degree AV block     Palliative Care Assessment & Plan   Patient Profile: 68 y.o. male admitted on 12/05/2018 from home with complaints of shortness of breath. He has a past medical history significant for chronic respiratory failure, COPD, home oxygen use 3L, ongoing tobacco and alcohol abuse, atrial fibrillation, CVA, sleep apnea, hepatitis C, GERD, diabetes, depression, cirrhosis, ascending aortic aneurysm, and CHF. Patient's wife provided history to admitting provider due to patient being intubated for respiratory failure. Wife reported patient was restless, diaphoretic, short of breath with tremors prior to arrival. During his ED course he was found to be hypoxic with saturations in the 70s. Wife also expressed that patient was recently seen and started on three rounds of antibiotics for aspiration/pneumonitis, which patient completed. Glucose 179, AST 71, ALT 67, ABG showed pCO2 67, pO2 225, with positive Allens test. Patient was intubated and sedated on levophed and propofol. He was started on Zosyn post cultures. Since admission he continues to be sedated and intubated. Goal of weaning off IV pressers and sedation for SBT. He continues on IV Zosyn and Solu-Medrol. Palliative Medicine team consulted for goals of care.    Recommendations/Plan:  DNR/DNI-as confirmed by wife/son (documented AD)  Continue to  treat the treatable until son arrives. Family is leaning towards comfort care with awareness of anticipated hospital death.   Family educated on comfort care measures and process. If available will return to discuss with other son.   Patient continues to remain  critical. Although he has alcohol use and may be experiencing withdrawals, he continues to struggle to maintain respiratory/cardio status. Comfort care for EOL is recommended given documented wishes and poor prognosis. Continue with Precedex support until further decisions are made.   PMT will continue to follow and support.   Goals of Care and Additional Recommendations:  Limitations on Scope of Treatment: Full Scope Treatment, No Tracheostomy and no reintubation,   Code Status:    Code Status Orders  (From admission, onward)         Start     Ordered   12/05/18 1147  Do not attempt resuscitation (DNR)  Continuous    Question Answer Comment  In the event of cardiac or respiratory ARREST Do not call a "code blue"   In the event of cardiac or respiratory ARREST Do not perform Intubation, CPR, defibrillation or ACLS   In the event of cardiac or respiratory ARREST Use medication by any route, position, wound care, and other measures to relive pain and suffering. May use oxygen, suction and manual treatment of airway obstruction as needed for comfort.      05-Dec-2018 1147        Code Status History    Date Active Date Inactive Code Status Order ID Comments User Context   11/26/2018 1026 12/05/18 1147 Full Code [id number]  Fritzi Mandes, MD Inpatient   09/16/2017 1710 09/19/2017 1813 Full Code [id number]  Thornton Park, MD Inpatient   09/16/2017 1710 09/16/2017 1710 Full Code [id number]  Henreitta Leber, MD Inpatient   09/03/2017 1139 09/05/2017 1833 Full Code [id number]  Nicholes Mango, MD Inpatient   06/17/2017 0517 06/18/2017 1608 Full Code [id number]  Saundra Shelling, MD ED   05/21/2016 0908 05/22/2016 1430 Full Code [id number]  Teodoro Spray, MD Inpatient   05/26/2015 1413 05/30/2015 1725 Full Code [id number]  Loletha Grayer, MD ED    Advance Directive Documentation     Most Recent Value  Type of Advance Directive  Healthcare Power of Attorney, Living will  Pre-existing out of facility DNR  order (yellow form or pink MOST form)  -  "MOST" Form in Place?  -      Prognosis:   POOR-patient continues to struggle and remain critically ill. If patient care is transitioned to comfort anticipate prognosis of hours-minutes given respiratory/cardio decompensation.   Discharge Planning:  To Be Determined-Family leaning more towards comfort once son arrives.  Care plan was discussed with patient's family, Trahm, RN, and Dr. Patsey Berthold.   Thank you for allowing the Palliative Medicine Team to assist in the care of this patient.   Time In: 1130 1330 Time Out: 1215 1400 Total Time 75 min.  Prolonged Time Billed  YES       Greater than 50%  of this time was spent counseling and coordinating care related to the above assessment and plan.  [name], AGPCNP-BC Palliative Medicine Team  Pager: [phone] Amion: Bjorn Pippin   Please contact Palliative Medicine Team phone at [phone] for questions and concerns.

## 2018-12-09 NOTE — Progress Notes (Signed)
Family discussion  I have explained to family of grave prognosis Patient with severe resp failure on biPAP Patient is suffereing and struggling to breathe  I have dicussed plan of care  At this time, plan is to continue medical management but will NOT use BiPAP/CPAP Continue high flow [place] as tolerated Patient remains DNR/DNI.  Family very satisfied with plan of action and management.    Family are satisfied with Plan of action and management. All questions answered  Corrin Parker, M.D.  [facility]  Medical Director [place] Director [facility] Cardio-Pulmonary Department

## 2018-12-09 NOTE — Progress Notes (Signed)
CRITICAL CARE NOTE  CC  follow up respiratory failure  SUBJECTIVE Patient remains critically ill Prognosis is guarded On biPAP unresponsive and with severe SOB     SIGNIFICANT EVENTS    BP (!) 155/81   Pulse 73   Temp 99 F (37.2 C) (Oral)   Resp 16   Ht 5\' 10"  (1.778 m)   Wt 117.3 kg   SpO2 97%   BMI 37.11 kg/m    REVIEW OF SYSTEMS  PATIENT IS UNABLE TO PROVIDE COMPLETE REVIEW OF SYSTEMS DUE TO SEVERE CRITICAL ILLNESS   PHYSICAL EXAMINATION:  GENERAL:critically ill appearing, +resp distress HEAD: Normocephalic, atraumatic.  EYES: Pupils equal, round, reactive to light.  No scleral icterus.  MOUTH: Moist mucosal membrane. NECK: Supple. No thyromegaly. No nodules. No JVD.  PULMONARY: +rhonchi, +wheezing CARDIOVASCULAR: S1 and S2. Regular rate and rhythm. No murmurs, rubs, or gallops.  GASTROINTESTINAL: Soft, nontender, -distended. No masses. Positive bowel sounds. No hepatosplenomegaly.  MUSCULOSKELETAL: No swelling, clubbing, or edema.  NEUROLOGIC: obtunded, GCS<8 SKIN:intact,warm,dry      Indwelling Urinary Catheter continued, requirement due to   Reason to continue Indwelling Urinary Catheter for strict Intake/Output monitoring for hemodynamic instability      ASSESSMENT AND PLAN SYNOPSIS 68 yo with end stage COPD with lung mass admitted for severe COPD exacerbation and acute systolic CHF exacerbatiobn and severe resp failure from pneumonia complicated by ETOH withdrawal  Severe Hypoxic and Hypercapnic Respiratory Failure Extubated, DNR/DNI On biPAP Worsening resp status   CARDIAC FAILURE- Systolic CHF EF 62% resp failure, on biPAP  SEVERE COPD EXACERBATION -continue IV steroids as prescribed -continue NEB THERAPY as prescribed -morphine as needed -wean fio2 as needed and tolerated   NEUROLOGY DT's -on precedex Very poor prognosis   CARDIAC ICU monitoring   GI/Nutrition GI PROPHYLAXIS as indicated DIET-->NPO  ENDO - ICU  hypoglycemic\Hyperglycemia protocol -check FSBS per protocol   ELECTROLYTES -follow labs as needed -replace as needed -pharmacy consultation and following   DVT/GI PRX ordered TRANSFUSIONS AS NEEDED MONITOR FSBS ASSESS the need for LABS as needed   Critical Care Time devoted to patient care services described in this note is 34 minutes.   Overall, patient is critically ill, prognosis is guarded.  Patient with Multiorgan failure and at high risk for cardiac arrest and death.   Palliative care following, recommend comfort care measures  Shia Delaine Patricia Pesa, M.D.  [facility]  Medical Director [place] Director [facility] Department

## 2018-12-09 NOTE — Death Summary Note (Signed)
Marland Kitchen  DEATH SUMMARY   Patient Details  Name: Marvin Lewis MRN: [id number] DOB: 05-19-1951  Admission/Discharge Information   Admit Date:  11-26-2018  Date of Death:   November 30, 2018  Time of Death:  02/28/04  Length of Stay: 4  Referring Physician: Arnetha Courser, MD   Reason(s) for Hospitalization  Shortness of Breath   Diagnoses  Preliminary cause of death: End stage COPD ([place]) Secondary Diagnoses (including complications and co-morbidities):  Active Problems:   Respiratory failure, acute-on-chronic (HCC)   End Stage COPD    Chronic Alcoholism   Chronic Atrial Fibrillation    Alcohol Withdrawal    Periorbital and tongue edema suspected secondary to ACE inhibitor's   Mitral Valvular Disease    Chronic Systolic CHF    Narrowed Right Bronchus secondary to Hilar Mass   Type II Diabetes Mellitus  Brief Hospital Course (including significant findings, care, treatment, and services provided and events leading to death)  Marvin Lewis is a 68 y.o. year old male who [facility], [facility] with worsening shortness of breath secondary to severe COPD exacerbation, acute systolic CHF exacerbation, and pneumonia complicated by ETOH withdrawl.  In the ER he was hypoxic and hypercapnic requiring mechanical intubation.  He developed transient hypotension likely secondary to positive pressure and sedation requiring levophed gtt.  He also had a known history of recurrent pneumonia and a narrowed right bronchus from a hilar mass with previous consideration of stenting.  He has a hx of previous intubations requiring tracheostomy placement that was subsequently reversed.  The pt previously discussed with his wife he did not want life sustaining measures, therefore palliative care consulted 11/17/18 at that time he remained a Full Code.  He was extubated on 11/18/2018 and respiratory status declined post extubation. He initially was supported by HFNC, however due to continued decline he required  Bipap.  On 11/18/2018 code status changed to DNR/DNI.  He expired on 11-30-18 at [birth year] 1904 with family present at bedside  Pertinent Labs and Studies  Significant Diagnostic Studies Ct Chest Wo Contrast  Result Date: 10/29/2018 CLINICAL DATA:  Shortness of breath, right-sided chest pain and pneumonia. Patient with known history of pulmonary carcinoid tumor. EXAM: CT CHEST WITHOUT CONTRAST TECHNIQUE: Multidetector CT imaging of the chest was performed following the standard protocol without IV contrast. COMPARISON:  Chest CT 06/15/2018 and 06/17/2017 FINDINGS: Cardiovascular: The heart is normal in size. No pericardial effusion. Stable fusiform aneurysmal dilatation of the ascending aorta which measures a maximum of 4.3 cm and is stable. Stable advanced three-vessel coronary artery calcifications. Mediastinum/Nodes: Stable mediastinal and hilar lymph nodes. Pretracheal node on image number 77 measures 10 mm. 10.5 mm subcarinal lymph node on image number 89. Lungs/Pleura: Stable large partially calcified right hilar/infrahilar mass which measures approximately 5.8 x 4.2 cm and is unchanged. This obstructs the right middle lobe bronchus which is chronically collapsed/atelectatic. There is also partial obstruction of the right lower lobe bronchus which appears to contain a calcification or broncholith. Several right lower lobe bronchi are slightly distended and filled with soft tissue or inflammatory debris. This could be mucoid impaction or endobronchial spread of tumor. Associated mild inflammatory changes in the right lower lobe but no focal airspace consolidation to suggest pneumonia. Stable emphysematous changes and pulmonary scarring. Stable area of right apical scarring. No worrisome pulmonary lesions elsewhere. No pleural effusion. Upper Abdomen: No significant upper abdominal findings. Could not exclude mild cirrhosis. Dense atherosclerotic calcifications involving the abdominal aorta and branch vessel  ostia, particularly the SMA. Musculoskeletal: No chest wall mass, supraclavicular or axillary adenopathy. No significant bony findings. Large bridging syndesmophytes are noted in the cervical spine suspicious for a inflammatory arthropathy. IMPRESSION: 1. Stable large right hilar/infrahilar calcified mass consistent with patient's known pulmonary carcinoid tumor. There is also persistent obstruction of the right middle lobe bronchus with right middle lobe collapse/atelectasis. There is also partial obstruction of the right lower lobe bronchus by a large calcification and there are dilated obstructed right lower lobe bronchi. This could be due to endobronchial spread of tumor or mucoid impaction. 2. No new pulmonary lesions. 3. Stable emphysematous changes and pulmonary scarring. 4. Stable fusiform aneurysmal dilatation of the ascending thoracic aorta with maximum measurement of 4.3 cm. 5. Stable mediastinal and hilar lymph nodes. 6. Stable advanced atherosclerotic calcifications involving the thoracic and abdominal aorta and branch vessels including dense three-vessel coronary artery calcifications. Aortic Atherosclerosis (ICD10-I70.0) and Emphysema (ICD10-J43.9). Electronically Signed   By: Marijo Sanes M.D.   On: 10/29/2018 16:13   Ct Chest W Contrast  Addendum Date: 11/25/2018   ADDENDUM REPORT: 11/09/2018 09:42 ADDENDUM: Not mentioned above is a nasogastric tube which terminates in the esophagus, just above the craniocaudal level of the carina. This was advanced on subsequent radiographs. Electronically Signed   By: Abigail Miyamoto M.D.   On: 11/30/2018 09:42   Result Date: 11/24/2018 CLINICAL DATA:  Shortness of breath. COPD. Cirrhosis. Chronic alcohol abuse. Known right middle lobe pneumonia. On antibiotics. EXAM: CT CHEST WITH CONTRAST TECHNIQUE: Multidetector CT imaging of the chest was performed during intravenous contrast administration. CONTRAST:  74mL OMNIPAQUE IOHEXOL 300 MG/ML  SOLN COMPARISON:   11/08/2018 chest radiograph. Most recent CT 10/29/2018. FINDINGS: Cardiovascular: Degraded exam, secondary to mild motion and patient arm position (not raised above the head). Advanced aortic and branch vessel atherosclerosis. Tortuous thoracic aorta. Moderate cardiomegaly, without pericardial effusion. Multivessel coronary artery atherosclerosis. Pulmonary artery enlargement, outflow tract 3.6 cm No central pulmonary embolism, on this non-dedicated study. Mediastinum/Nodes: No supraclavicular adenopathy. Borderline enlarged precarinal node of 10 mm is unchanged. A subcarinal node of 12 mm is similar to 11 mm on the prior. Partially calcified right hilar mass again identified. On the order of 6.0 x 4.5 cm today versus 5.8 x 4.2 cm on the prior. Lungs/Pleura: New trace right pleural fluid. Appropriate position of endotracheal tube, well above the carina. Right lower lobe obstruction or compression by tumor is felt to be slightly increased, including on image 77/3. Presumed scarring at the right apex, with a similar somewhat more nodular component medially, including at 9 mm on image 36/3. Centrilobular and paraseptal emphysema is moderate. Patchy posterior right upper lobe peribronchovascular airspace opacities. Development of posterior left upper lobe and dense left lower lobe consolidation compared to the prior CT. Significantly worsened right lower lobe aeration, with consolidation throughout. Persistent posterior right middle lobe collapse and volume loss. Upper Abdomen: Mild cirrhosis. Cholecystectomy. Normal imaged portions of the stomach, spleen, left adrenal gland, kidneys. Mild right adrenal nodularity is unchanged. Abdominal aortic atherosclerosis. Prominent porta hepatis nodes are likely reactive. Musculoskeletal: Remote left rib trauma. Moderate thoracic spondylosis. IMPRESSION: 1. Degradation, including motion and patient arm position. 2. Development of multifocal pulmonary opacities, including dense  consolidation in both lung bases. Findings are consistent with multifocal pneumonia or aspiration. 3. Right hilar partially calcified mass, consistent with the clinical history of carcinoid. This is similar to slightly increased compared to 10/29/2018. Borderline mediastinal adenopathy is unchanged. Secondary partial collapse of the right middle lobe  is relatively similar. 4. Aortic atherosclerosis (ICD10-I70.0), coronary artery atherosclerosis and emphysema (ICD10-J43.9). 5. Pulmonary artery enlargement suggests pulmonary arterial hypertension. 6. Cirrhosis. Electronically Signed: By: Abigail Miyamoto M.D. On: 11/22/2018 09:23   Dg Chest Port 1 View  Result Date: 12/06/18 CLINICAL DATA:  Acute respiratory failure EXAM: PORTABLE CHEST 1 VIEW COMPARISON:  11/17/2018 FINDINGS: The endotracheal tube and nasogastric catheter have been removed in the interval. Cardiac shadow remains enlarged. Tortuous and mildly calcified aorta is again identified. Bilateral pleural effusions are noted right greater than left. Some right basilar atelectasis is noted as well. Mild vascular congestion remains. IMPRESSION: Right basilar atelectasis and bilateral pleural effusions. The overall appearance is stable with the exception of interval removal of ET and NG catheters. Electronically Signed   By: Inez Catalina M.D.   On: 2018/12/06 07:46   Dg Chest Port 1 View  Result Date: 11/17/2018 CLINICAL DATA:  Respiratory failure EXAM: PORTABLE CHEST 1 VIEW COMPARISON:  11/27/2018 FINDINGS: Endotracheal tube in good position. NG tube in place with tip not visualized. Progression of bibasilar airspace disease. Progressive vascular congestion with possible edema. IMPRESSION: Endotracheal tube in good position. Progression of bilateral pleural effusions and bibasilar airspace disease. Progressive vascular congestion. Possible fluid overload versus pneumonia. Electronically Signed   By: Franchot Gallo M.D.   On: 11/17/2018 20:43   Dg Chest  Port 1 View  Result Date: 11/22/2018 CLINICAL DATA:  Evaluate endotracheal tube status. EXAM: PORTABLE CHEST 1 VIEW COMPARISON:  Earlier today at 0600 hours. FINDINGS: 640 hours. Endotracheal tube terminates 5.1 cm above carina.Mild cardiomegaly. Small right pleural effusion or pleural thickening, blunting the right costophrenic angle. Right base airspace disease. Hyperinflation. No congestive failure. No pneumothorax. IMPRESSION: Appropriate position of endotracheal tube, 5.1 cm above carina. Persistent right base airspace disease and pleural fluid or thickening. Electronically Signed   By: Abigail Miyamoto M.D.   On: 11/24/2018 07:15   Dg Chest Port 1 View  Result Date: 11/14/2018 CLINICAL DATA:  Respiratory distress. Carcinoid tumor. EXAM: PORTABLE CHEST 1 VIEW COMPARISON:  09/04/2018 radiography. Chest CT 10/29/2017 FINDINGS: Similar appearance to previous exams. Right hilar region mass with calcification better shown at CT. Chronic volume loss in the right lower lung with right pleural blunting. There could be coexistent pneumonia in this region. Some emphysema in the upper lungs. No acute finding on the left. IMPRESSION: Right hilar region mass with calcification better shown at CT. Chronic volume loss in the right lower lung with right pleural blunting. There could be coexistent pneumonia in this region. Electronically Signed   By: Nelson Chimes M.D.   On: 11/14/2018 06:53   Dg Abd Portable 1 View  Result Date: 11/26/2018 CLINICAL DATA:  Repositioning of nasogastric tube. EXAM: PORTABLE ABDOMEN - 1 VIEW COMPARISON:  Chest radiograph of earlier today. FINDINGS: Extensive support apparatus projecting over the upper abdomen, obscuring the distal portion of the nasogastric tube. The tube has been advanced, favored to terminate over the body of the stomach. No gross free intraperitoneal air. Bibasilar airspace disease has been detailed previously. IMPRESSION: Suboptimal exam, secondary to extensive overlying  support apparatus. Nasogastric tube likely terminating at the body of the stomach. Electronically Signed   By: Abigail Miyamoto M.D.   On: 12/08/2018 09:39    Microbiology Recent Results (from the past 240 hour(s))  MRSA PCR Screening     Status: None   Collection Time: 12/03/2018 10:29 AM  Result Value Ref Range Status   MRSA by PCR NEGATIVE NEGATIVE Final  Comment:        The GeneXpert MRSA Assay (FDA approved for NASAL specimens only), is one component of a comprehensive MRSA colonization surveillance program. It is not intended to diagnose MRSA infection nor to guide or monitor treatment for MRSA infections. Performed at [facility], [address] [place], [place] [address]   Culture, blood (routine x 2)     Status: None (Preliminary result)   Collection Time: 12/02/2018  6:33 PM  Result Value Ref Range Status   Specimen Description BLOOD R ARM  Final   Special Requests   Final    BOTTLES DRAWN AEROBIC AND ANAEROBIC Blood Culture adequate volume   Culture   Final    NO GROWTH 4 DAYS Performed at [facility], [address] [place], [place] [address]    Report Status PENDING  Incomplete  Culture, blood (routine x 2)     Status: None (Preliminary result)   Collection Time: 12/02/2018  7:16 PM  Result Value Ref Range Status   Specimen Description BLOOD RIGHT ANTECUBITAL  Final   Special Requests   Final    BOTTLES DRAWN AEROBIC AND ANAEROBIC Blood Culture adequate volume   Culture   Final    NO GROWTH 4 DAYS Performed at [facility], [address] [place], [place] [address]    Report Status PENDING  Incomplete    Lab Basic Metabolic Panel: Recent Labs  Lab 12/04/2018 0607 12/03/2018 0618 11/16/18 0701 11/17/18 0533 11/18/18 0715 Nov 20, 2018 0549  NA  --  140 138  --  137 145  K  --  4.4 4.2  --  4.0 3.5  CL  --  101 105  --  106 109  CO2  --  27 24  --  23 28  GLUCOSE  --  179* 174*  --  267* 133*  BUN  --  22 31*  --  42* 37*   CREATININE  --  0.84 1.19  --  1.21 1.01  CALCIUM  --  8.7* 7.8*  --  8.2* 8.5*  MG 1.7  --  2.0 1.9  --  1.7  PHOS 4.3  --  4.9* 4.6  --  3.3   Liver Function Tests: Recent Labs  Lab 11/16/2018 0618  AST 71*  ALT 67*  ALKPHOS 105  BILITOT 0.9  PROT 7.8  ALBUMIN 3.5   No results for input(s): LIPASE, AMYLASE in the last 168 hours. No results for input(s): AMMONIA in the last 168 hours. CBC: Recent Labs  Lab 11/13/2018 0618 11/16/18 0701 11/18/18 0715 Nov 20, 2018 0549  WBC 10.3 9.2 6.8 9.4  NEUTROABS 7.5  --  5.8  --   HGB 13.3 10.7* 11.8* 11.9*  HCT 42.7 33.1* 38.3* 38.1*  MCV 94.7 94.6 96.2 94.5  PLT 156 100* 99* 109*   Cardiac Enzymes: Recent Labs  Lab 11/18/2018 0618  TROPONINI <0.03   Sepsis Labs: Recent Labs  Lab 11/24/2018 0618 12/02/2018 0625 11/16/18 0701 11/18/18 0715 20-Nov-2018 0549  PROCALCITON 0.10  --   --   --   --   WBC 10.3  --  9.2 6.8 9.4  LATICACIDVEN  --  2.2* 1.7  --   --     Procedures/Operations  Mechanical Intubation   Marda Stalker, [place] Pager [phone] (please enter 7 digits) PCCM Consult Pager [phone] (please enter 7 digits)

## 2018-12-09 NOTE — Progress Notes (Signed)
RN spoke to Palliative care NP, Lexine Baton who states she will not be able to come back and to request for Dr Mortimer Fries to speak to family and place orders according to family wishes.  RN notified Dr Mortimer Fries who states that "will be right up"

## 2018-12-09 NOTE — Progress Notes (Signed)
   11-24-2018 1350  Clinical Encounter Type  Visited With Patient and family together  Visit Type Initial;Spiritual support;Critical Care  Referral From Nurse  Consult/Referral To Chaplain  Spiritual Encounters  Spiritual Needs Prayer;Emotional;Grief support;Other (Comment)   [place] received an OR to consult with family after the patient's condition has digressed today. I met with the patient's wife and two other family members. The patient is sedated and using a BiPAP.  I offered spiritual support and was asked to pray my the patient's wife. I offered to follow up as needed.

## 2018-12-09 NOTE — Progress Notes (Signed)
RN notified Palliative Care NP Lexine Baton that other son is here, NP will call this RN back.

## 2018-12-09 NOTE — Progress Notes (Signed)
Dr Patsey Berthold at bedside, states "restart his precedex."

## 2018-12-09 NOTE — Progress Notes (Signed)
   02-Dec-2018 1900  Clinical Encounter Type  Visited With Family;Patient not available  Visit Type Follow-up;Spiritual support;Death  Referral From Nurse  Spiritual Encounters  Spiritual Needs Emotional;Grief support;Prayer  [place] paged to offer support to family following patient death; [place] offered spiritual, emotional and grief support along with prayer.

## 2018-12-09 NOTE — Progress Notes (Signed)
SLP Cancellation Note  Patient Details Name: [name] MRN: [id number] DOB: [birth year]   Cancelled treatment:       Reason Eval/Treat Not Completed: Patient not medically ready;Fatigue/lethargy limiting ability to participate;Medical issues which prohibited therapy(chart reviewed; consulted NSG re: pt's status this AM). Upon observation at bedside, pt's HR is elevated and he is agitated in the bed. He is currently on HFNC for O2 support. Per MD, he may be "withdrawing" - pt was admitted with acute on chronic hypoxic respiratory failure, alcohol withdrawal, possible pneumonia post obstructive/mucoid plug.  ST services will hold on MBSS at this time d/t pt's presentation and risk for aspiration w/ any po's. ST services will f/u tomorrow. MD agreed. NSG updated.     Orinda Kenner, MS, CCC-SLP Janeene Sand Dec 09, 2018, 10:07 AM

## 2018-12-09 NOTE — Progress Notes (Signed)
Dr Vianne Bulls at bedside, RN notified MD that per night nurse, unsure if patient can swallow was extubated yesterday.   MD gave order for speech eval.

## 2018-12-09 NOTE — Progress Notes (Signed)
RN notified Dr Patsey Berthold of patient's seeming lethargic, does not required ativan per CIWA at this time but bp is high.  Received prn hydralazine IV.  Blood gas was done yesterday morning.  MD states "I will look at patient"

## 2018-12-09 NOTE — Progress Notes (Signed)
Dr Mortimer Fries at bedside, spoke to family, states "we are going to continue everything except stop the bipap."  RN clarify order for 5mg  IV metoprolol.  MD states "discontinue the order"

## 2018-12-09 NOTE — Progress Notes (Signed)
Patient became hypoxic unable to pick up pulse ox even at 100% Hi Flow. DNR/DRI order in place. NP Hinton Dyer notified, patient pronouced by Jeannie Fend, RN and Olga Millers, RN at [phone].  [place] notified, Elink to call CDS. Nursing supervisor, Colletta [place] notified.

## 2018-12-09 NOTE — Progress Notes (Signed)
DNR order in [facility].   Still waiting for another son to get here to discuss with palliative care regarding moving to comfort measures, per Palliative care: can titrate precedex up for patient's comfort at this time.   Family in room with patient, updated.

## 2018-12-09 NOTE — Progress Notes (Signed)
Nutrition Follow-up  DOCUMENTATION CODES:   Obesity unspecified  INTERVENTION:  Will monitor for plan of care and discussions regarding goals of care. Noted family was considering transitioning to comfort care. No appropriate nutrition interventions at this time.  NUTRITION DIAGNOSIS:   Inadequate oral intake related to inability to eat as evidenced by NPO status.  Ongoing.  GOAL:   Provide needs based on ASPEN/SCCM guidelines  Previously met on TFs.  MONITOR:   Vent status, Labs, Weight trends, TF tolerance, I & O's  REASON FOR ASSESSMENT:   Ventilator    ASSESSMENT:   68 year old male with PMHx of GERD, depression, CAD, cirrhosis, EtOH abuse, diverticulitis, severe COPD, emphysema of lung, HLD, HTN, sleep apnea, hx hiatal hernia, hx esophageal rupture, A-fib, intrathoracic aortic aneurysm, hepatitis C, hx tracheostomy s/p decannulation, hx PEG tube s/p removal who is admitted with hypercapnic respiratory failure requiring intubation on 12/8, with calcified right infrahilar mass (carcinoid vs slow growing malignancy vs nonmalignant process), EtOH withdrawal, and elevated BNP.  Patient was extubated on 12/11. He was previously tolerating goal TF regimen well of Vital High Protein at 20 mL/hr + Pro-Stat 60 mL QID. SLP was consulted to see patient this AM but he was not appropriate for assessment at that time. Patient is requiring BiPAP. Family is considering transitioning to comfort measures.  Medications reviewed and include: clonazepam, folic acid 1 mg daily, Novolog 0-15 units Q6hrs, liquid MVI daily per tube, pantoprazole, Miralax, thiamine 100 mg daily, NS @ 100 mL/hr, Precedex gtt, Zosyn.  Labs reviewed: CBG 130-154, BUN 37.  Discussed with RN and on rounds.  Diet Order:   Diet Order            Diet NPO time specified  Diet effective now             EDUCATION NEEDS:   No education needs have been identified at this time  Skin:  Skin Assessment: Reviewed RN  Assessment  Last BM:  November 27, 2018 - medium type 6  Height:   Ht Readings from Last 1 Encounters:  12/06/2018 5' 10"  (1.778 m)   Weight:   Wt Readings from Last 1 Encounters:  11-27-18 117.3 kg   Ideal Body Weight:  75.5 kg  BMI:  Body mass index is 37.11 kg/m.  Estimated Nutritional Needs:   Kcal:  2100-2300  Protein:  105-115 grams  Fluid:  1.8 L/day (25 mL/kg IBW)  Willey Blade, MS, RD, LDN Office: [phone] Pager: [phone] After Hours/Weekend Pager: [phone]

## 2018-12-09 NOTE — Progress Notes (Signed)
Family in room, refused fsbs. Unable to administer SSI.

## 2018-12-09 DEATH — deceased

## 2019-02-09 IMAGING — CT CT CHEST W/O CM
2 of 3 series · 14 of 36 positions shown, 17 images · non-contrast
Comparison: Chest radiograph from earlier today. 10/15/2016 chest
CT.

CLINICAL DATA: Inpatient. Weakness. Hemoptysis. Dyspnea. History of
biopsy proven right middle lobe carcinoid.

EXAM:
CT CHEST WITHOUT CONTRAST
TECHNIQUE: Multidetector CT imaging of the chest was performed following the
standard protocol without IV contrast.

[Series 2: thorax · axial · 0.78mm/px · z∈[-580,-312]mm · 11 of 158 slices shown, 14 images]
[im 12/158  mediastinal]
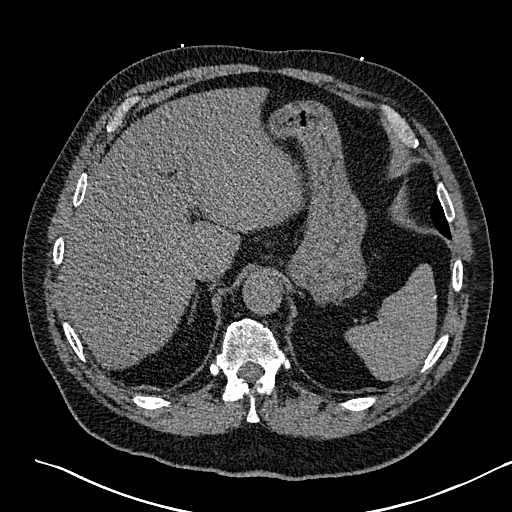
[im 12/158  lung]
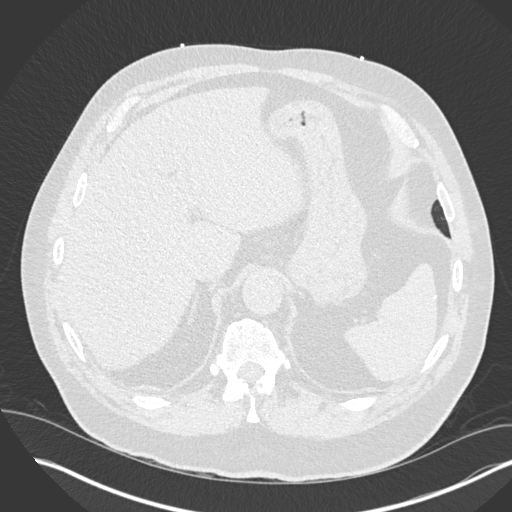
[im 24/158  lung]
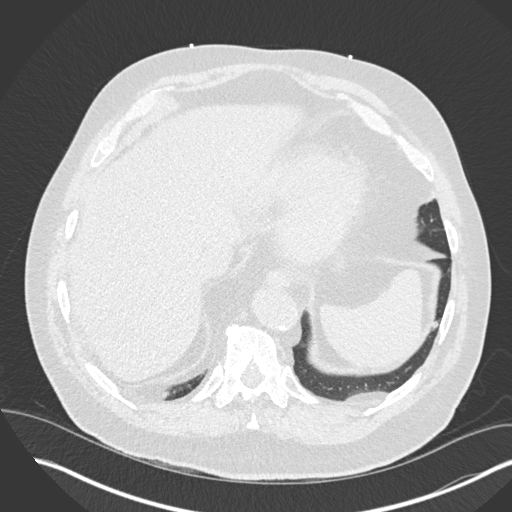
[im 35/158  lung]
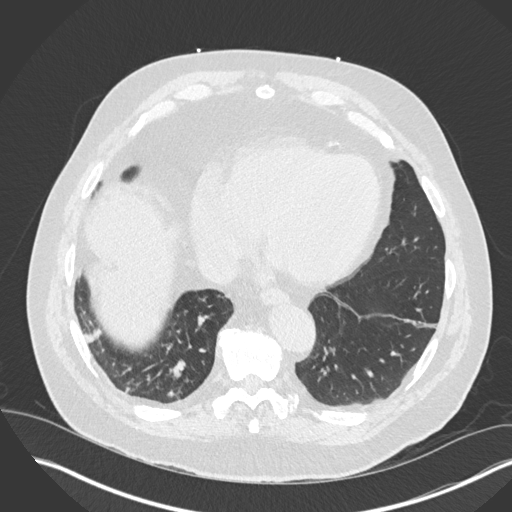
[im 53/158  lung]
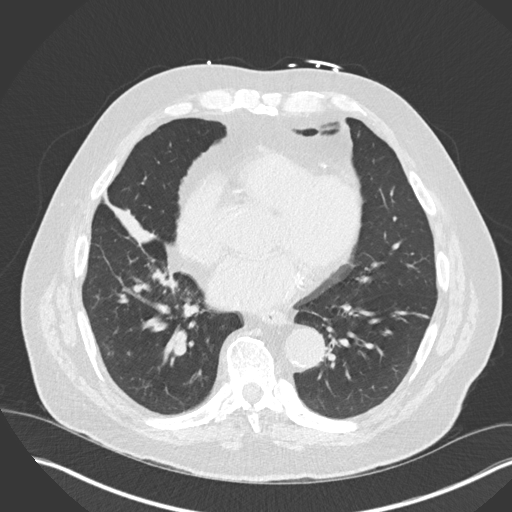
[im 64/158  mediastinal]
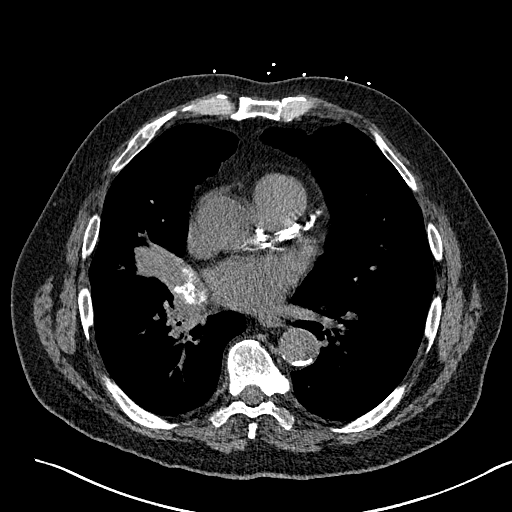
[im 64/158  lung]
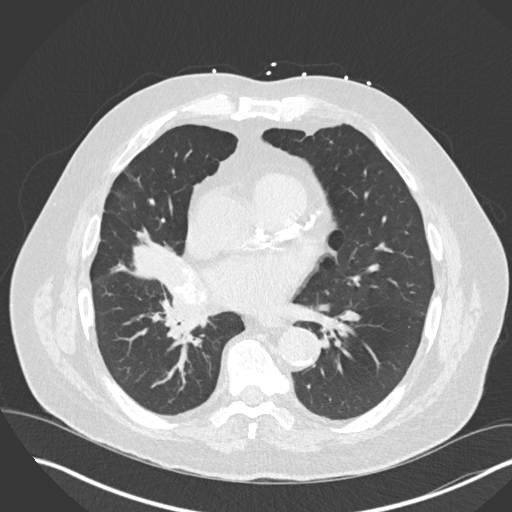
[im 82/158  lung]
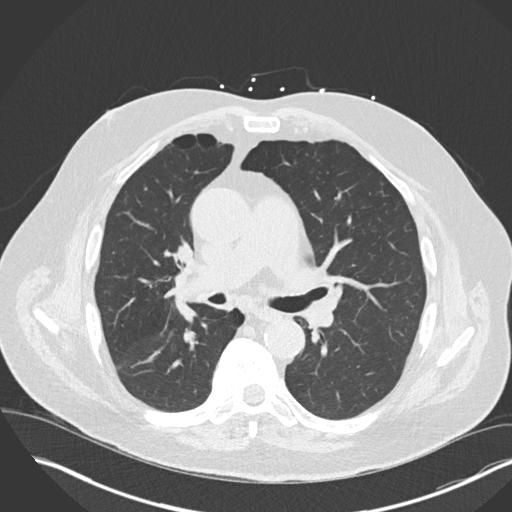
[im 94/158  lung]
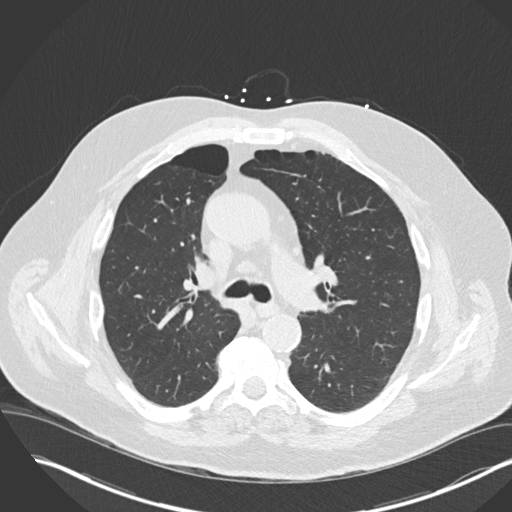
[im 105/158  lung]
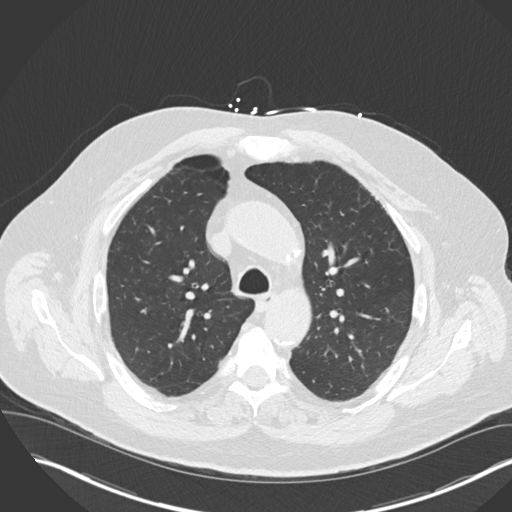
[im 123/158  mediastinal]
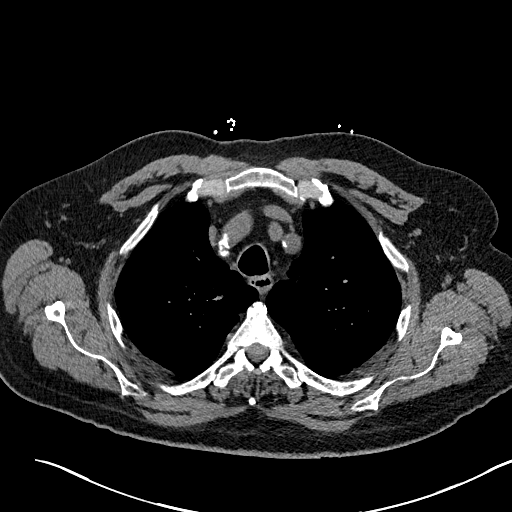
[im 123/158  lung]
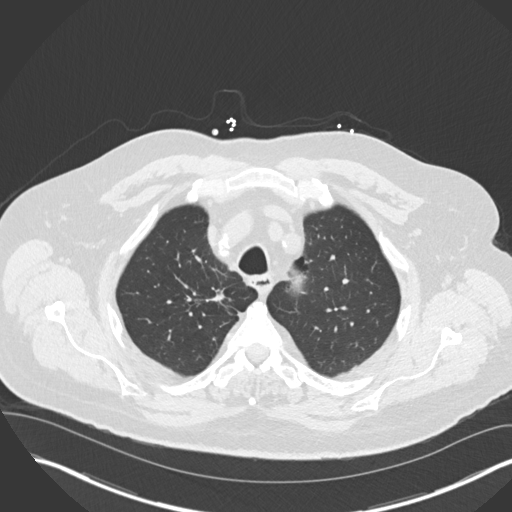
[im 134/158  lung]
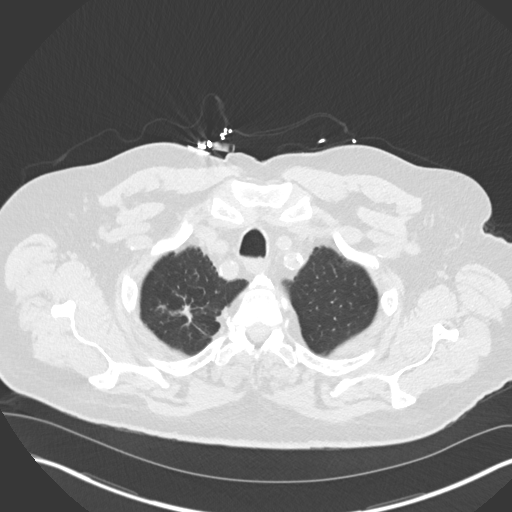
[im 146/158  lung]
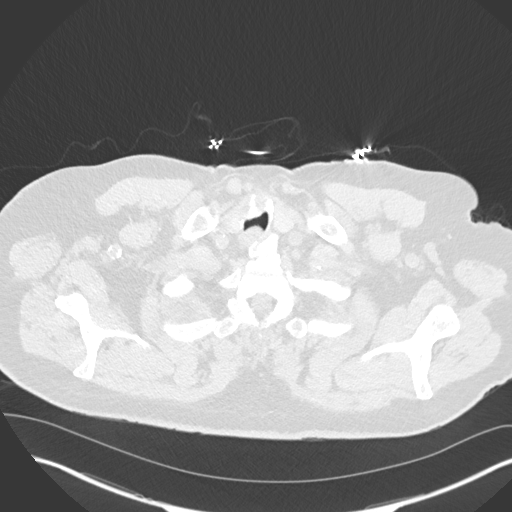

[Series 5: coronal · coronal · 0.64mm/px · 3 of 165 slices shown]
[im 33/165  lung]
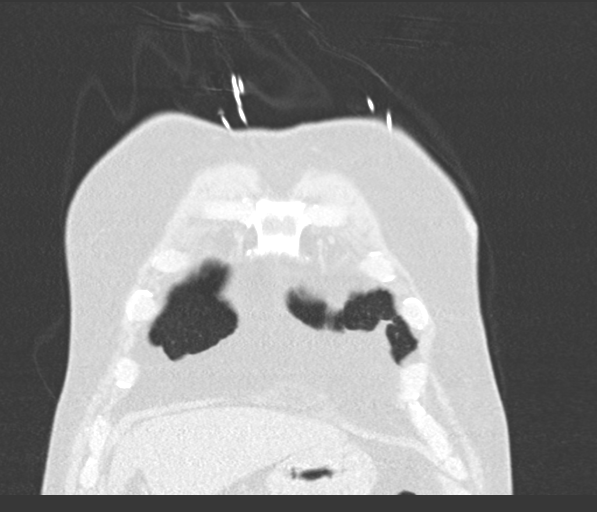
[im 66/165  lung]
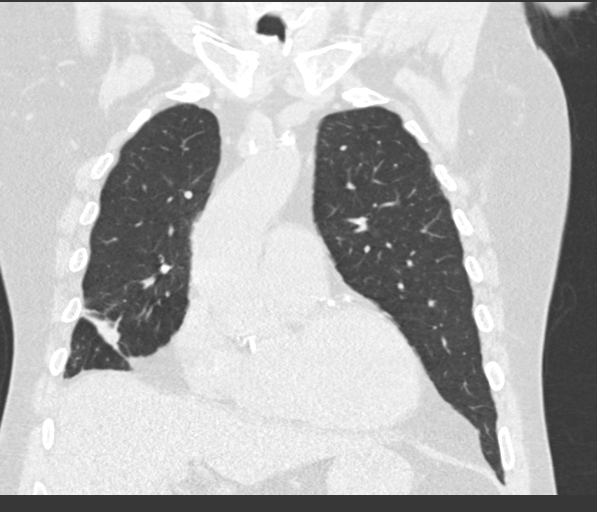
[im 99/165  lung]
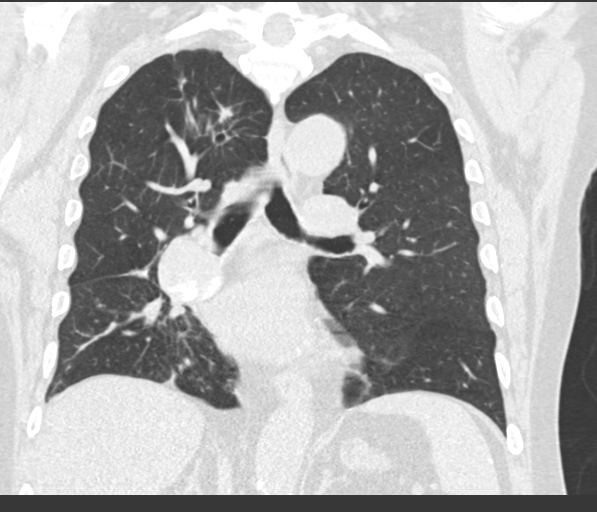

[14 of 36 positions shown; findings below may reference images not displayed]

FINDINGS: Cardiovascular: Top-normal heart size. No significant pericardial
fluid/thickening. Left main, left anterior descending, left
circumflex and right coronary atherosclerosis. Thoracic aortic
atherosclerosis with 4.4 cm ascending thoracic aortic aneurysm,
stable. Dilated main pulmonary artery (3.9 cm diameter), slightly
increased from 3.7 cm.

Mediastinum/Nodes: No discrete thyroid nodules. Unremarkable
esophagus. No pathologically enlarged axillary, mediastinal or gross
hilar lymph nodes, noting limited sensitivity for the detection of
hilar adenopathy on this noncontrast study.

Lungs/Pleura: No pneumothorax. No pleural effusion. Central right
lower 5.0 x 4.2 cm lung mass (series 2/ image 89) demonstrates
heterogeneous internal calcifications, previously 5.0 x 4.0 cm on
10/15/2016 using similar measurement technique, not appreciably
changed in size. Associated occlusion of the right middle lobe
bronchus with complete right middle lobe collapse, unchanged.
Worsened mass-effect on the right lower lobe bronchus, which now
appears occluded. New patchy tree-in-bud opacities and mucoid
impaction throughout the right lower lobe. Stable irregular
parenchymal bands of the right lung apex, lingula and left lower
lobe, compatible with mild postinfectious/ postinflammatory
scarring. Otherwise no significant pulmonary nodules. Mild
centrilobular and paraseptal emphysema.

Upper abdomen: Moderate diffuse hepatic steatosis.

Musculoskeletal: No aggressive appearing focal osseous lesions.
Moderate thoracic spondylosis.
IMPRESSION: 1. Stable size of 5.0 cm heterogeneously calcified central right
lower lung mass, compatible with biopsy-proven carcinoid.
2. Stable occlusion of the right middle lobe bronchus with complete
right middle lobe collapse.
3. Worsened mass-effect on the right lower lobe bronchus, which is
now occluded. Increased postobstructive pneumonitis/mucoid impaction
throughout the right lower lobe.
4. Stable 4.4 cm ascending thoracic aortic aneurysm. Recommend
annual imaging followup by CTA or MRA. This recommendation follows
5030 ACCF/AHA/AATS/ACR/ASA/SCA/BORMANS/TIGER/YUOSEF/GQ Guidelines for the
Diagnosis and Management of Patients with Thoracic Aortic Disease.
Circulation. 5030; 121: [id number].
5. Dilated main pulmonary artery, slightly increased, suggesting
pulmonary arterial hypertension.
6. Left main and 3 vessel coronary atherosclerosis.
7. Moderate diffuse hepatic steatosis.

Aortic Atherosclerosis (4VL4B-6G4.4) and Emphysema (4VL4B-AWT.J).

Aortic aneurysm NOS (4VL4B-M7G.9).
